# Patient Record
Sex: Female | Born: 1945 | Race: White | Hispanic: No | Marital: Single | Smoking: Current every day smoker
Health system: Southern US, Community
[De-identification: ages and names within clinical notes are randomized; demographics above are authoritative.]

## PROBLEM LIST (undated history)

## (undated) ENCOUNTER — Emergency Department: Discharge status: Absent without leave | Payer: Managed Care, Other (non HMO)

## (undated) DIAGNOSIS — R51 Headache: Secondary | ICD-10-CM

## (undated) DIAGNOSIS — M810 Age-related osteoporosis without current pathological fracture: Secondary | ICD-10-CM

## (undated) DIAGNOSIS — E785 Hyperlipidemia, unspecified: Secondary | ICD-10-CM

## (undated) DIAGNOSIS — R519 Headache, unspecified: Secondary | ICD-10-CM

## (undated) DIAGNOSIS — F209 Schizophrenia, unspecified: Secondary | ICD-10-CM

## (undated) DIAGNOSIS — F1910 Other psychoactive substance abuse, uncomplicated: Secondary | ICD-10-CM

## (undated) DIAGNOSIS — H409 Unspecified glaucoma: Secondary | ICD-10-CM

## (undated) DIAGNOSIS — M1990 Unspecified osteoarthritis, unspecified site: Secondary | ICD-10-CM

## (undated) DIAGNOSIS — F329 Major depressive disorder, single episode, unspecified: Secondary | ICD-10-CM

## (undated) DIAGNOSIS — F419 Anxiety disorder, unspecified: Secondary | ICD-10-CM

## (undated) DIAGNOSIS — G709 Myoneural disorder, unspecified: Secondary | ICD-10-CM

## (undated) DIAGNOSIS — E119 Type 2 diabetes mellitus without complications: Secondary | ICD-10-CM

## (undated) DIAGNOSIS — F32A Depression, unspecified: Secondary | ICD-10-CM

## (undated) DIAGNOSIS — M869 Osteomyelitis, unspecified: Secondary | ICD-10-CM

## (undated) DIAGNOSIS — J449 Chronic obstructive pulmonary disease, unspecified: Secondary | ICD-10-CM

## (undated) DIAGNOSIS — I10 Essential (primary) hypertension: Secondary | ICD-10-CM

## (undated) DIAGNOSIS — H269 Unspecified cataract: Secondary | ICD-10-CM

## (undated) HISTORY — DX: Type 2 diabetes mellitus without complications: E11.9

## (undated) HISTORY — DX: Unspecified osteoarthritis, unspecified site: M19.90

## (undated) HISTORY — DX: Anxiety disorder, unspecified: F41.9

## (undated) HISTORY — DX: Unspecified glaucoma: H40.9

## (undated) HISTORY — PX: VASCULAR SURGERY: SHX849

## (undated) HISTORY — DX: Depression, unspecified: F32.A

## (undated) HISTORY — DX: Hyperlipidemia, unspecified: E78.5

## (undated) HISTORY — DX: Age-related osteoporosis without current pathological fracture: M81.0

## (undated) HISTORY — DX: Other psychoactive substance abuse, uncomplicated: F19.10

## (undated) HISTORY — PX: KNEE SURGERY: SHX244

## (undated) HISTORY — DX: Unspecified cataract: H26.9

## (undated) HISTORY — DX: Essential (primary) hypertension: I10

## (undated) HISTORY — DX: Major depressive disorder, single episode, unspecified: F32.9

## (undated) HISTORY — DX: Myoneural disorder, unspecified: G70.9

## (undated) HISTORY — PX: MULTIPLE TOOTH EXTRACTIONS: SHX2053

## (undated) HISTORY — PX: EYE SURGERY: SHX253

---

## 1999-08-04 ENCOUNTER — Inpatient Hospital Stay (HOSPITAL_COMMUNITY): Admission: EM | Admit: 1999-08-04 | Discharge: 1999-08-05 | Payer: Self-pay | Admitting: Emergency Medicine

## 1999-08-05 ENCOUNTER — Encounter: Payer: Self-pay | Admitting: *Deleted

## 1999-08-12 ENCOUNTER — Encounter (HOSPITAL_COMMUNITY): Admission: RE | Admit: 1999-08-12 | Discharge: 1999-11-10 | Payer: Self-pay | Admitting: *Deleted

## 2006-04-18 ENCOUNTER — Emergency Department (HOSPITAL_COMMUNITY): Admission: EM | Admit: 2006-04-18 | Discharge: 2006-04-18 | Payer: Self-pay | Admitting: Family Medicine

## 2006-06-13 ENCOUNTER — Emergency Department (HOSPITAL_COMMUNITY): Admission: EM | Admit: 2006-06-13 | Discharge: 2006-06-13 | Payer: Self-pay | Admitting: Emergency Medicine

## 2006-12-06 ENCOUNTER — Emergency Department (HOSPITAL_COMMUNITY): Admission: EM | Admit: 2006-12-06 | Discharge: 2006-12-06 | Payer: Self-pay | Admitting: Emergency Medicine

## 2007-01-20 ENCOUNTER — Ambulatory Visit: Payer: Self-pay | Admitting: Endocrinology

## 2007-01-20 DIAGNOSIS — R51 Headache: Secondary | ICD-10-CM | POA: Insufficient documentation

## 2007-01-20 DIAGNOSIS — E1165 Type 2 diabetes mellitus with hyperglycemia: Secondary | ICD-10-CM

## 2007-01-20 DIAGNOSIS — R519 Headache, unspecified: Secondary | ICD-10-CM | POA: Insufficient documentation

## 2007-01-20 DIAGNOSIS — E785 Hyperlipidemia, unspecified: Secondary | ICD-10-CM

## 2007-02-23 ENCOUNTER — Ambulatory Visit: Payer: Self-pay | Admitting: Internal Medicine

## 2007-02-23 DIAGNOSIS — I10 Essential (primary) hypertension: Secondary | ICD-10-CM

## 2007-02-23 DIAGNOSIS — F17200 Nicotine dependence, unspecified, uncomplicated: Secondary | ICD-10-CM

## 2007-02-23 DIAGNOSIS — F259 Schizoaffective disorder, unspecified: Secondary | ICD-10-CM | POA: Insufficient documentation

## 2007-03-10 ENCOUNTER — Encounter: Payer: Self-pay | Admitting: Internal Medicine

## 2007-03-16 ENCOUNTER — Ambulatory Visit: Payer: Self-pay | Admitting: Internal Medicine

## 2007-03-16 LAB — CONVERTED CEMR LAB
ALT: 29 units/L (ref 0–35)
AST: 27 units/L (ref 0–37)
Albumin: 4.1 g/dL (ref 3.5–5.2)
Alkaline Phosphatase: 63 units/L (ref 39–117)
Basophils Absolute: 0 10*3/uL (ref 0.0–0.1)
Calcium: 9.7 mg/dL (ref 8.4–10.5)
Chloride: 94 meq/L — ABNORMAL LOW (ref 96–112)
Eosinophils Absolute: 0.2 10*3/uL (ref 0.0–0.6)
GFR calc non Af Amer: 91 mL/min
HDL: 37.2 mg/dL — ABNORMAL LOW (ref >39.0)
Hgb A1c MFr Bld: 10.7 % — ABNORMAL HIGH (ref 4.6–6.0)
MCHC: 32.3 g/dL (ref 30.0–36.0)
MCV: 88.7 fL (ref 78.0–100.0)
Platelets: 235 10*3/uL (ref 150–400)
RBC: 4.61 M/uL (ref 3.87–5.11)
TSH: 1.55 microintl units/mL (ref 0.35–5.50)
Total CHOL/HDL Ratio: 8
Triglycerides: 192 mg/dL — ABNORMAL HIGH (ref 0–149)
WBC: 5.9 10*3/uL (ref 4.5–10.5)

## 2007-03-21 ENCOUNTER — Encounter: Payer: Self-pay | Admitting: Internal Medicine

## 2007-04-06 ENCOUNTER — Ambulatory Visit: Payer: Self-pay | Admitting: Internal Medicine

## 2007-05-01 ENCOUNTER — Ambulatory Visit: Payer: Self-pay | Admitting: Endocrinology

## 2007-05-11 ENCOUNTER — Ambulatory Visit: Payer: Self-pay | Admitting: Endocrinology

## 2007-05-11 ENCOUNTER — Ambulatory Visit: Payer: Self-pay | Admitting: Internal Medicine

## 2007-05-11 LAB — CONVERTED CEMR LAB
AST: 25 units/L (ref 0–37)
HDL: 40.8 mg/dL (ref >39.0)
Triglycerides: 146 mg/dL (ref 0–149)

## 2007-05-18 ENCOUNTER — Ambulatory Visit: Payer: Self-pay | Admitting: Internal Medicine

## 2007-05-23 ENCOUNTER — Encounter: Payer: Self-pay | Admitting: Internal Medicine

## 2007-07-10 ENCOUNTER — Ambulatory Visit: Payer: Self-pay | Admitting: Internal Medicine

## 2007-07-10 DIAGNOSIS — R350 Frequency of micturition: Secondary | ICD-10-CM

## 2007-07-11 ENCOUNTER — Encounter: Payer: Self-pay | Admitting: Internal Medicine

## 2007-07-12 LAB — CONVERTED CEMR LAB
ALT: 25 units/L (ref 0–35)
AST: 26 units/L (ref 0–37)
Albumin: 4 g/dL (ref 3.5–5.2)
BUN: 13 mg/dL (ref 6–23)
Bilirubin Urine: NEGATIVE
CO2: 28 meq/L (ref 19–32)
Chloride: 100 meq/L (ref 96–112)
Cholesterol: 198 mg/dL (ref 0–200)
Creatinine, Ser: 0.7 mg/dL (ref 0.4–1.2)
GFR calc non Af Amer: 90 mL/min
Glucose, Bld: 273 mg/dL — ABNORMAL HIGH (ref 70–99)
Hgb A1c MFr Bld: 9.9 % — ABNORMAL HIGH (ref 4.6–6.0)
Leukocytes, UA: NEGATIVE
Total CHOL/HDL Ratio: 5.6
Total Protein: 7.1 g/dL (ref 6.0–8.3)
Triglycerides: 236 mg/dL (ref 0–149)
VLDL: 47 mg/dL — ABNORMAL HIGH (ref 0–40)
pH: 5 (ref 5.0–8.0)

## 2007-07-21 ENCOUNTER — Telehealth (INDEPENDENT_AMBULATORY_CARE_PROVIDER_SITE_OTHER): Payer: Self-pay | Admitting: *Deleted

## 2007-07-26 ENCOUNTER — Ambulatory Visit: Payer: Self-pay | Admitting: Internal Medicine

## 2007-12-25 ENCOUNTER — Telehealth: Payer: Self-pay | Admitting: Internal Medicine

## 2008-03-08 ENCOUNTER — Telehealth: Payer: Self-pay | Admitting: Internal Medicine

## 2008-09-17 ENCOUNTER — Emergency Department (HOSPITAL_COMMUNITY): Admission: EM | Admit: 2008-09-17 | Discharge: 2008-09-17 | Payer: Self-pay | Admitting: Family Medicine

## 2009-08-26 ENCOUNTER — Emergency Department (HOSPITAL_COMMUNITY): Admission: EM | Admit: 2009-08-26 | Discharge: 2009-08-26 | Payer: Self-pay | Admitting: Emergency Medicine

## 2009-09-03 ENCOUNTER — Encounter: Admission: RE | Admit: 2009-09-03 | Discharge: 2009-09-03 | Payer: Self-pay | Admitting: Otolaryngology

## 2009-09-08 ENCOUNTER — Ambulatory Visit (HOSPITAL_BASED_OUTPATIENT_CLINIC_OR_DEPARTMENT_OTHER): Admission: RE | Admit: 2009-09-08 | Discharge: 2009-09-08 | Payer: Self-pay | Admitting: Otolaryngology

## 2009-09-11 ENCOUNTER — Encounter: Admission: RE | Admit: 2009-09-11 | Discharge: 2009-10-17 | Payer: Self-pay | Admitting: Family Medicine

## 2009-10-06 ENCOUNTER — Emergency Department (HOSPITAL_COMMUNITY): Admission: EM | Admit: 2009-10-06 | Discharge: 2009-10-06 | Payer: Self-pay | Admitting: Emergency Medicine

## 2009-11-04 ENCOUNTER — Encounter
Admission: RE | Admit: 2009-11-04 | Discharge: 2009-11-20 | Payer: Self-pay | Source: Home / Self Care | Admitting: Family Medicine

## 2010-02-22 LAB — CONVERTED CEMR LAB: Pap Smear: NORMAL

## 2010-04-09 LAB — COMPREHENSIVE METABOLIC PANEL
ALT: 21 U/L (ref 0–35)
AST: 22 U/L (ref 0–37)
Albumin: 4.3 g/dL (ref 3.5–5.2)
CO2: 28 mEq/L (ref 19–32)
Calcium: 9.6 mg/dL (ref 8.4–10.5)
Chloride: 101 mEq/L (ref 96–112)
GFR calc Af Amer: >60 mL/min (ref >60)
GFR calc non Af Amer: >60 mL/min (ref >60)
Sodium: 137 mEq/L (ref 135–145)
Total Bilirubin: 0.6 mg/dL (ref 0.3–1.2)

## 2010-04-09 LAB — URINALYSIS, ROUTINE W REFLEX MICROSCOPIC
Bilirubin Urine: NEGATIVE
Ketones, ur: NEGATIVE mg/dL
Protein, ur: NEGATIVE mg/dL
Urobilinogen, UA: 0.2 mg/dL (ref 0.0–1.0)

## 2010-04-09 LAB — DIFFERENTIAL
Eosinophils Absolute: 0.2 10*3/uL (ref 0.0–0.7)
Eosinophils Relative: 2 % (ref 0–5)
Lymphocytes Relative: 37 % (ref 12–46)
Lymphs Abs: 3.6 10*3/uL (ref 0.7–4.0)
Monocytes Absolute: 0.7 10*3/uL (ref 0.1–1.0)

## 2010-04-09 LAB — URINE MICROSCOPIC-ADD ON

## 2010-04-09 LAB — LIPASE, BLOOD: Lipase: 34 U/L (ref 11–59)

## 2010-04-09 LAB — CBC
Hemoglobin: 14 g/dL (ref 12.0–15.0)
Platelets: 241 10*3/uL (ref 150–400)
RBC: 4.49 MIL/uL (ref 3.87–5.11)

## 2010-04-09 LAB — GLUCOSE, CAPILLARY: Glucose-Capillary: 231 mg/dL — ABNORMAL HIGH (ref 70–99)

## 2010-04-10 LAB — BASIC METABOLIC PANEL
BUN: 14 mg/dL (ref 6–23)
Chloride: 97 mEq/L (ref 96–112)
GFR calc non Af Amer: >60 mL/min (ref >60)
Potassium: 5.2 mEq/L — ABNORMAL HIGH (ref 3.5–5.1)
Sodium: 133 mEq/L — ABNORMAL LOW (ref 135–145)

## 2010-04-10 LAB — GLUCOSE, CAPILLARY
Glucose-Capillary: 308 mg/dL — ABNORMAL HIGH (ref 70–99)
Glucose-Capillary: 342 mg/dL — ABNORMAL HIGH (ref 70–99)

## 2010-06-02 ENCOUNTER — Ambulatory Visit
Admission: RE | Admit: 2010-06-02 | Discharge: 2010-06-02 | Disposition: A | Discharge status: Home / Self Care | Payer: BC Managed Care – PPO | Source: Ambulatory Visit | Attending: Family Medicine | Admitting: Family Medicine

## 2010-06-02 ENCOUNTER — Other Ambulatory Visit: Payer: Self-pay | Admitting: Family Medicine

## 2010-06-02 DIAGNOSIS — R0989 Other specified symptoms and signs involving the circulatory and respiratory systems: Secondary | ICD-10-CM

## 2010-06-09 ENCOUNTER — Other Ambulatory Visit: Payer: Self-pay | Admitting: Family Medicine

## 2010-06-09 DIAGNOSIS — Z1231 Encounter for screening mammogram for malignant neoplasm of breast: Secondary | ICD-10-CM

## 2010-06-12 NOTE — Procedures (Signed)
[facility]  Patient:    [name], [name]                     MRN: [id number] Proc. Date: 08/05/99 Adm. Date:  [id number] Attending:  Sharyn Dross                           Procedure Report  PREOPERATIVE DIAGNOSIS: 1. Blood in stools. 2. Family history of colon polyps.  POSTOPERATIVE DIAGNOSIS:  Normal colonoscopic examination to the cecum.  PROCEDURE PERFORMED:  Colonoscopy.  MEDICATIONS USED:  Demerol 100 mg IV, Versed 10 mg IV over a 15-minute period of time.  INSTRUMENT USED:  Olympus video pancolonoscope.  ENDOSCOPIST:  Dortha Kern, M.D.  INFORMED CONSENT:  The patient was advised of the procedure, the indications and the risks involved.  The patient has agreed to have the procedure performed.  PREOPERATIVE PREPARATION:  The patient was brought to the endoscopy unit where an IV for IV sedating medication was started.  A monitor was placed on the patient to monitor the patients vital signs and oxygen saturation.  Nasal oxygen at two liters a minute was used and after adequate sedation was performed, the procedure was begun.  BOWEL PREP:  The patient was given GoLYTELY and Reglan as a bowel prep at this time.  The patient tolerated the prep well without any complications.  The quality of the prep was fair to good.  DESCRIPTION OF PROCEDURE:  The instrument was advanced with the patient lying in the left lateral position to approximately 95 cm in the proximal colon to the cecum.  This was confirmed by palpation, transillumination as well as visualization of what appeared to be the ileocecal valve.  There appeared to be no gross abnormalities such as masses, polyps or stricture lesions appreciated.  The vascular pattern appeared to be well within normal limits throughout the entire colon.  The mucosal pattern showed evidence of diverticulosis that was present at this time but otherwise no other abnormalities noted.  There was no evidence  of any residual heme that was appreciated at this time.  The diverticula appeared to be small to some moderate-sized diverticula at this time.  No evidence of any diverticulitis was appreciated at this time.  There was no increased tortuosity of the colon that was appreciated at this time.  The instrument was able to traverse all the way to the cecum with minimal discomforts that was noted to the patient.  The instrument was subsequently removed from the rectum without any evidence of internal and external hemorrhoids noted.  The patient tolerating the procedure well.  TREATMENT: 1. Conservative management at this time. 2. Will prepare the patient for discharge in the a.m. if relatively stable. DD:  08/05/99 TD:  08/05/99 Job: [id number]

## 2010-06-12 NOTE — H&P (Signed)
[facility]  Patient:    [name], [name]                     MRN: [id number] Adm. Date:  [id number] Attending:  Devoria Albe                         History and Physical  SUBJECTIVE:  This pleasant, 65 year old, white female was admitted into the hospital because of orthostasis, dizziness, and tarry stools over the last several days.  The patient has recently traveled down to the [place] area from [place], [place].  She has been on Celebrex and Ultram for joint pains and past knee surgery.  She has been relatively stable but recently has been experiencing left upper quadrant pains and discomforts.  She has also had increased anorexia with nauseousness and some emesis of material, usually bilious in color, that was present.  Over the past few days the patient has noticed increased darkening of her stools, becoming more tarry in character at this time.  Today (on the day before admission) the patient was noticing increased symptoms of dizziness associated with with orthostatic changes.  She subsequently was brought into the emergency room whereupon being evaluated by the EDP present, showed evidence of moderate to marked orthostasis and extreme paleness with symptoms consistent possibly with an upper GI bleed.  I was contacted to come in to evaluate the patient at this time.  The patient denies any previous history of peptic ulcer disease and admits to being on the Celebrex and Ultram for the last several years for which she has taken for her joints discomfort.  The patient has been followed by her primary physician (Dr. Remer Macho).  She presently admits to being on a series of medications for problems such as hypertension, as well as on medicines like Paxil and amitriptyline at this time.  The patient presently is not on any aspirin or aspirin-like therapy that is presently known.  PAST MEDICAL HISTORY:  The patient does have a history of  hypertension for which she takes Tenormin medication on a daily basis.  She is presently also on an antibiotic (similar to Ceftin) for a sinus or upper infection.  OBJECTIVE:  GENERAL:  She is a pleasant female lying in the supine position at this time. She is communicative, alert, and oriented.  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  VITAL SIGNS:  Her vital signs appear to be stable with a blood pressure initially of 122/71 lying, decreased to 113/65 sitting.  The standing blood pressure was unable to be taken because it dropped.  Her pulse rate was 120 lying, increasing to 127 in a sitting position.  Her respiratory rate was 20. She was afebrile.  HEENT:  Anicteric.  Funduscopic examination was benign.  NECK:  Supple.  LUNGS:  Clear to auscultation and percussion.  HEART:  Tachycardic, without heaves, thrills, murmurs, or gallops.  ABDOMEN:  Soft.  Positive tenderness to palpation in the left upper quadrant region without rebound or referred tenderness.  There was no asymmetry of the abdominal region present at this time.  No palpable spleen or hepatosplenomegaly noted.  RECTAL:  The RE was not performed at this time (previously done by the EDP, with heme positive stools noted).  EXTREMITIES:  No cyanosis, clubbing, or edema.  NEUROLOGIC:  Not tested.  LABORATORY DATA:  Initial hemoglobin 12.6, hematocrit 38.  Normal MCV at this time.  Polys 83%, lymphs 12%, monos 3%.  Her BMET showed a serum sodium of 134, potassium 4.9, chloride 102, CO2 21.9, glucose 137, BUN 58, creatinine 1, calcium 10.1, total protein 7.8, albumin 4.1, total bilirubin 0.5, alkaline phosphatase 108, AST 17, ALT 15, amylase 43, lipase 17.  GROSS IMPRESSION:  Gastrointestinal bleeding, moderate.  Most likely consistent with an upper gastrointestinal bleed.  The differential diagnosis to consider is peptic ulcer disease, gastritis, or nonsteroidal anti-inflammatory drug  gastropathy versus other causes.  PRESENT RECOMMENDATIONS:  The patient will be admitted into the hospital today for emergency endoscopy to be performed, and depending upon these results will determine the course of therapy. DD:  08/04/99 TD:  08/04/99 Job: [id number]

## 2010-06-12 NOTE — Discharge Summary (Signed)
[facility]  Patient:    [name], [name]                     MRN: [id number] Adm. Date:  [id number] Disc. Date: [id number] Attending:  Sharyn Dross                           Discharge Summary  ADMITTING DIAGNOSIS:  Gastrointestinal bleeding, etiology unknown.  DISCHARGE DIAGNOSIS:  Diverticulosis, presently no bleeding noted.  CONDITION ON DISCHARGE:  Stable and improved.  DISCHARGE MEDICATIONS:  None.  To resume previous home medications without change.  COMPLICATIONS:  None.  PROCEDURES: 1. July 10 - Esophagogastroduodenoscopy showing mild gastritis.  No active bleeding    noted. 2. August 05, 1999 - Colonoscopy showing diverticulosis without blood present.  HOSPITAL COURSE:  The patient was admitted into the hospital because of increased abdominal pain and discomfort.  After getting extremely dizzy, she presented to the emergency room, where she was orthostatic in character.  Because of the documented melanotic stools that were present, the patient was brought in at that time.  The patient was admitted into the hospital and brought to endoscopy on the first day.  Endoscopy showed evidence of mild gastritis changes, but no evidence of any bleeding noted.  There was bilious material that was present, and the small intestinal region appeared to be within normal limits.  IVs were started at this time and the patient was typed and screened for blood.  However, although her hemoglobin had dropped from 11.8 to approximately 9.2, the patient was not orthostatic and remained relatively stable.  The patient was cleaned and prepped for colonoscopic examination and, on July 11, the patient underwent colonoscopic examination.  the colon showed evidence of diverticulosis throughout the colon area, but no evidence of any bleeding that as present at this time.  The patient had an excellent bowel prep which evacuated ny residual bleeding that was  noted.  The patients condition is relatively stable at this time and, although there is  the family history of colon polyps, the patient was relieved that she did not have any process of polyps, cancers or lesions that were present.  The patient will e discharged to home at this time to resume her previous medications which she is  taking.  She also to know that her hemoglobin is slightly low at approximately , but it identified not require a transfusion at this time.  She was advised to contact her primary physician when she gets back to her location and to have her blood count checked to see if transfusion may be necessary for her at that time.  I discussed with the patient diverticulosis and the complications of diverticular disease from that standpoint.  We have also discussed the treatment plan, the recurrence of possible bleeding which could occur as well as the indications for surgery if it does occur.  The patient also was noted to have a family history f colon polyps (a brother), and was advised that the rest of the family members (brothers and sisters) be evaluated for possible colon polyps or neoplastic processes that were present.  Although they fully understand this at this time nd are in agreement to have this done, they will comply by contacting their primary physicians for this process.  FINAL DIAGNOSIS:  As above.  CLINICAL CONDITION:  Stable and improved. DD:  08/05/99 TD:  08/06/99 Job: [id number]

## 2010-06-12 NOTE — Procedures (Signed)
[facility]  Patient:    [name], [name]                     MRN: [id number] Proc. Date: 08/04/99 Adm. Date:  [id number] Attending:  Sharyn Dross                           Procedure Report  POSTOPERATIVE DIAGNOSIS:  Rule out blood in stools.  POSTOPERATIVE DIAGNOSIS:  Mild antral gastritis, otherwise normal endoscopic examination.  No bleeding identified.  PROCEDURE:  Esophagogastroduodenoscopy.  MEDICATIONS:  Demerol 50 mg IV, Versed 6.5 mg IV over 10-15 minutes.  INSTRUMENT:  Olympus video panendoscope.  ENDOSCOPIST:  Sharyn Dross., M.D.  INFORMED CONSENT:  The patient was advised of the procedure, indications, risks involved and patient agreed to have procedure performed.  PREPROCEDURE PREPARATION:  Patient was brought to the endoscopy unit with an IV and sedation medication was used.  Monitor was placed on the patient to monitor the patients vital signs as well as oxygen saturation. Nasal oxygen at 2 liters per minute was used and after adequate sedation, the procedure was begun.  DESCRIPTION OF PROCEDURE:  The patient was placed in the left lateral decubitus position.  Instrument was advanced using direct technique without difficulty.  The oropharynx, epiglottis, vocal cords and piriform sinuses appeared to be grossly within normal  limits.  The esophagus was normal without any evidence of acute inflammation, ulcerations, hiatal hernia or varices appreciated.  The gastric areas showed a normal mucous lake without any evidence of any bleeding noted at this time. The was bilious and salivary material present in the gastric region at this time.  The mucosa showed some minimal inflammatory changes in the antral region at this time, especially around the prepyloric region, but no evidence of any ulcerations or visible bleeding vessels noted.  The pylorus was normal with good peristaltic activity.  UPon advancing into the pyloric canal,  the duodenal bulb and second portion appeared to be within normal limits. The instrument could not be advanced further because of increased retching by the patient at that time.  The instrument was gradually retracted and on retroflexion in the cardia, no evidence of hiatal hernia present or any visible bleeding spots noted at this time.  The Z line appeared to be at approximately 39 cm in the distal esophagus.  as the instrument was retracted back further, there was a small reddish area but no bleeding appreciated at this time.  The instrument was subsequently withdrawn without difficulty.  The patient tolerated the procedure well.  TREATMENT: 1. Would continue with present protocol with IV Pepcid at this time. 2. Blood pool study today. 3. In discussion with the family, there is a strong history of first    generation relatives having polypoid lesions appreciated; thus, will    proceed with colonoscopic examination in the a.m. DD:  08/04/99 TD:  08/04/99 Job: 350 [id number]

## 2010-06-24 ENCOUNTER — Ambulatory Visit
Admission: RE | Admit: 2010-06-24 | Discharge: 2010-06-24 | Disposition: A | Discharge status: Home / Self Care | Payer: BC Managed Care – PPO | Source: Ambulatory Visit | Attending: Family Medicine | Admitting: Family Medicine

## 2010-06-24 DIAGNOSIS — Z1231 Encounter for screening mammogram for malignant neoplasm of breast: Secondary | ICD-10-CM

## 2014-04-06 ENCOUNTER — Ambulatory Visit (INDEPENDENT_AMBULATORY_CARE_PROVIDER_SITE_OTHER): Payer: Medicare Other

## 2014-04-06 ENCOUNTER — Ambulatory Visit (INDEPENDENT_AMBULATORY_CARE_PROVIDER_SITE_OTHER): Payer: Medicare Other | Admitting: Emergency Medicine

## 2014-04-06 VITALS — BP 106/70 | HR 89 | Temp 98.2°F | Ht 60.0 in | Wt 216.5 lb

## 2014-04-06 DIAGNOSIS — M545 Low back pain: Secondary | ICD-10-CM

## 2014-04-06 DIAGNOSIS — F17200 Nicotine dependence, unspecified, uncomplicated: Secondary | ICD-10-CM

## 2014-04-06 DIAGNOSIS — Z720 Tobacco use: Secondary | ICD-10-CM | POA: Diagnosis not present

## 2014-04-06 MED ORDER — ACETAMINOPHEN-CODEINE 300-30 MG PO TABS: 1.0000 | ORAL_TABLET | ORAL | Status: DC | PRN

## 2014-04-06 NOTE — Progress Notes (Signed)
Routine labs were done Synthroid and Flonase were refilled

## 2014-04-06 NOTE — Progress Notes (Addendum)
Subjective:  This chart was scribed for Nena Jordan, MD by Mercy Moore, Medial Scribe. This patient was seen in room 13 and the patient's care was started at 3:48 PM.    Patient ID: [name], female    DOB: 10/17/1945, 69 y.o.   MRN: [id number] Chief Complaint  Patient presents with  . Back Pain    C/o lower back pain since 2-3 weeks. Radius down leg at times & wakes her up from her sleep     HPI HPI Comments: [name] is a 69 y.o. female who presents to the [facility] complaining of lower back pain with radiation into left leg, ongoing for 2-3 weeks. Patient states that she feels that she's pulled a muscle in her lower back or leg. Patient reports two incidences of recent heavy lifting, but she is unsure what initiated her pain. Patient reports that her pain is so severe that it wakes up at night, and she has been unable to comfortably rest at night since onset of her pain. Patient reports that her pain restricts her from performing mundane house work.  Patient reports upcoming appointment with her PCP, in two days, but she states she couldn't wait that long.  Patient shares that she has taken Vicodin, but she doesn't like it because "it makes her feel like she will throw up." Patient reports success with Codeine.    Patient Active Problem List   Diagnosis Date Noted  . FREQUENCY, URINARY 07/10/2007  . 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 TOBACCO ABUSE 02/23/2007  . HYPERTENSION 02/23/2007  . DIAB W/O MENTION COMP TYPE II/UNS TYPE UNCNTRL 01/20/2007  . HYPERLIPIDEMIA 01/20/2007  . HEADACHE 01/20/2007   Past Medical History  Diagnosis Date  . Anxiety   . Arthritis   . Cataract   . Depression   . Diabetes mellitus without complication   . Glaucoma   . Hypertension   . Osteoporosis   . Substance abuse    Past Surgical History  Procedure Laterality Date  . Eye surgery    . Knee surgery     Allergies  Allergen Reactions  .  Sitagliptin Phosphate     REACTION: nausea   Prior to Admission medications   Medication Sig Start Date End Date Taking? Authorizing Provider  amLODipine (NORVASC) 10 MG tablet Take 10 mg by mouth daily.   Yes Historical Provider, MD  chlorproMAZINE (THORAZINE) 100 MG tablet Take 100 mg by mouth 3 (three) times daily.   Yes Historical Provider, MD  Insulin Aspart (NOVOLOG FLEXPEN [place]) Inject into the skin. Takes 7mg , 9mg , 9mg  daily   Yes Historical Provider, MD  insulin glargine (LANTUS) 100 UNIT/ML injection Inject into the skin at bedtime. Takes 17mg  at bedtime   Yes Historical Provider, MD  sertraline (ZOLOFT) 100 MG tablet Take 100 mg by mouth daily.   Yes Historical Provider, MD  valsartan (DIOVAN) 160 MG tablet Take 160 mg by mouth daily.   Yes Historical Provider, MD   History   Social History  . Marital Status: Single    Spouse Name: N/A  . Number of Children: N/A  . Years of Education: N/A   Occupational History  . Not on file.   Social History Main Topics  . Smoking status: Current Every Day Smoker  . Smokeless tobacco: Not on file  . Alcohol Use: No  . Drug Use: No  . Sexual Activity: Not on file   Other Topics Concern  . Not on  file   Social History Narrative  . No narrative on file      Review of Systems  Constitutional: Negative for fever and chills.  Genitourinary: Negative for hematuria.  Musculoskeletal: Positive for back pain.  Skin: Negative for color change and rash.  Neurological: Negative for weakness and numbness.       Objective:   Physical Exam  CONSTITUTIONAL: Well developed/well nourished HEAD: Normocephalic/atraumatic EYES: EOMI/PERRL ENMT: Mucous membranes moist NECK: supple no meningeal signs SPINE/BACK: mild tenderness to lower lumbar spine on left, straight leg raise negative CV: S1/S2 noted, no murmurs/rubs/gallops noted LUNGS: Lungs are clear to auscultation bilaterally, no apparent distress ABDOMEN: soft, nontender, no  rebound or guarding, bowel sounds noted throughout abdomen GU: no cva tenderness NEURO: Pt is awake/alert/appropriate, moves all extremitiesx4.  No facial droop. Motor strength 5/5   EXTREMITIES: pulses normal/equal, full ROM  SKIN: warm, color normal PSYCH: no abnormalities of mood noted, alert and oriented to situation  Filed Vitals:   04/06/14 1520  BP: 106/70  Pulse: 89  Temp: 98.2 F (36.8 C)  TempSrc: Oral  Height: 5' (1.524 m)  Weight: 216 lb 8 oz (98.204 kg)  SpO2: 91%   [facility] reading (PRIMARY) by  Dr. Everlene Farrier there is significant facet arthritis there is significant vascular calcification along the obliques there are 4 buttons seen.      Assessment & Plan:  3 his Tylenol 3 for pain because she can take this medication. We'll schedule an ultrasound the abdomen because of her significant vascular calcification.I personally performed the services described in this documentation, which was scribed in my presence. The recorded information has been reviewed and is accurate.

## 2014-04-15 ENCOUNTER — Other Ambulatory Visit: Payer: Self-pay

## 2014-04-24 ENCOUNTER — Ambulatory Visit
Admission: RE | Admit: 2014-04-24 | Discharge: 2014-04-24 | Disposition: A | Discharge status: Home / Self Care | Payer: Medicare Other | Source: Ambulatory Visit | Attending: Emergency Medicine | Admitting: Emergency Medicine

## 2014-04-24 ENCOUNTER — Other Ambulatory Visit: Payer: Self-pay | Admitting: Emergency Medicine

## 2014-04-24 DIAGNOSIS — F17200 Nicotine dependence, unspecified, uncomplicated: Secondary | ICD-10-CM

## 2014-04-24 DIAGNOSIS — M545 Low back pain: Secondary | ICD-10-CM

## 2014-06-06 ENCOUNTER — Encounter: Payer: Self-pay | Admitting: Internal Medicine

## 2014-06-06 ENCOUNTER — Telehealth: Payer: Self-pay

## 2014-06-06 NOTE — Telephone Encounter (Signed)
Called and spoke with patient to find out if she has had a recent mammogram. Patient declines mammogram at this time.

## 2014-07-09 ENCOUNTER — Telehealth: Payer: Self-pay

## 2014-07-09 NOTE — Telephone Encounter (Signed)
Contacted pt and she will make an appt

## 2014-09-23 ENCOUNTER — Telehealth: Payer: Self-pay

## 2014-09-23 DIAGNOSIS — I8390 Asymptomatic varicose veins of unspecified lower extremity: Secondary | ICD-10-CM

## 2014-09-23 DIAGNOSIS — I83892 Varicose veins of left lower extremities with other complications: Secondary | ICD-10-CM

## 2014-09-23 NOTE — Telephone Encounter (Signed)
Phone call received from pt.  Reported she had an episode of sudden onset of bleeding from vein in left lower leg @ ankle.  Reported "there was blood everywhere, but my son was able to get it to stop bleeding."  Reported the bleeding occurred on Friday, 8/26.  Stated she cleaned the site with an antiseptic cleanser, and wrapped the leg, and rested through the weekend.  Reported no further bleeding since episode on Friday.  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   Denied any swelling.  Denied any open sores on lower leg.  Stated she has some redness of the left lower leg that comes and goes.  Advised will have a scheduler call back with appt.  Pt. requested to see Dr. Oneida Alar, but stated she'll see whomever has an opening 1st.  Advised if bleeding of vein reoccurs, to hold direct pressure over the site, x 5 minutes, with 1-2 fingers, and elevate to stop the bleeding.  Verb. Understanding.

## 2014-10-25 ENCOUNTER — Encounter: Payer: Medicare Other | Admitting: Vascular Surgery

## 2014-10-25 ENCOUNTER — Encounter (HOSPITAL_COMMUNITY): Payer: Medicare Other

## 2015-06-05 ENCOUNTER — Ambulatory Visit (INDEPENDENT_AMBULATORY_CARE_PROVIDER_SITE_OTHER): Payer: Medicare Other | Admitting: Family Medicine

## 2015-06-05 VITALS — BP 120/62 | HR 74 | Temp 97.9°F | Resp 16 | Ht 61.0 in | Wt 208.4 lb

## 2015-06-05 DIAGNOSIS — S239XXA Sprain of unspecified parts of thorax, initial encounter: Secondary | ICD-10-CM | POA: Diagnosis not present

## 2015-06-05 DIAGNOSIS — S43402A Unspecified sprain of left shoulder joint, initial encounter: Secondary | ICD-10-CM | POA: Diagnosis not present

## 2015-06-05 MED ORDER — MELOXICAM 7.5 MG PO TABS: 7.5000 mg | ORAL_TABLET | Freq: Every day | ORAL | Status: DC | PRN

## 2015-06-05 NOTE — Patient Instructions (Addendum)
Please gently stretch your back several times a day Use heat as needed Come back to see [name] if you are not better in a couple of days or if you get worse  Thoracic Strain A thoracic strain, which is sometimes called a mid-back strain, is an injury to the muscles or tendons that attach to the upper part of your back behind your chest. This type of injury occurs when a muscle is overstretched or overloaded.  Thoracic strains can range from mild to severe. Mild strains may involve stretching a muscle or tendon without tearing it. These injuries may heal in 1-2 weeks. More severe strains involve tearing of muscle fibers or tendons. These will cause more pain and may take 6-8 weeks to heal. CAUSES This condition may be caused by:  An injury in which a sudden force is placed on the muscle.  Exercising without properly warming up.  Overuse of the muscle.  Improper form during certain movements.  Other injuries that surround or cause stress on the mid-back, causing a strain on the muscles. In some cases, the cause may not be known. RISK FACTORS This injury is more common in:  Athletes.  People with obesity. SYMPTOMS The main symptom of this condition is pain, especially with movement. Other symptoms include:  Bruising.  Swelling.  Spasm. DIAGNOSIS This condition may be diagnosed with a physical exam. X-rays may be taken to check for a fracture. TREATMENT This condition may be treated with:  Resting and icing the injured area.  Physical therapy. This will involve doing stretching and strengthening exercises.  Medicines for pain and inflammation. HOME CARE INSTRUCTIONS  Rest as needed. Follow instructions from your health care provider about any restrictions on activity.  If directed, apply ice to the injured area:  Put ice in a plastic bag.  Place a towel between your skin and the bag.  Leave the ice on for 20 minutes, 2-3 times per day.  Take over-the-counter and  prescription medicines only as told by your health care provider.  Begin doing exercises as told by your health care provider or physical therapist.  Always warm up properly before physical activity or sports.  Bend your knees before you lift heavy objects.  Keep all follow-up visits as told by your health care provider. This is important. SEEK MEDICAL CARE IF:  Your pain is not helped by medicine.  Your pain, bruising, or swelling is getting worse.  You have a fever. SEEK IMMEDIATE MEDICAL CARE IF:  You have shortness of breath.  You have chest pain.  You develop numbness or weakness in your legs.  You have involuntary loss of urine (urinary incontinence).   This information is not intended to replace advice given to you by your health care provider. Make sure you discuss any questions you have with your health care provider.   Document Released: 04/03/2003 Document Revised: 10/02/2014 Document Reviewed: 03/07/2014 Elsevier Interactive Patient Education 2016 [employer].    IF you received an x-ray today, you will receive an invoice from [facility]. Please contact [facility] at [phone] with questions or concerns regarding your invoice.   IF you received labwork today, you will receive an invoice from [employer]. Please contact [facility] at [phone] with questions or concerns regarding your invoice.   Our billing staff will not be able to assist you with questions regarding bills from these companies.  You will be contacted with the lab results as soon as they are available. The fastest  way to get your results is to activate your My Chart account. Instructions are located on the last page of this paperwork. If you have not heard from [name] regarding the results in 2 weeks, please contact this office.

## 2015-06-05 NOTE — Progress Notes (Signed)
   Subjective:    Patient ID: [name], female    DOB: [birth year], 70 y.o.   MRN: [id number]  HPI This is a pleasant 70 yo female who presents today with 2 days of left shoulder and back pain. She was trying to get a bra on that she had sewn the front closed. She had pain last night and today. She got some relief with tylenol x 1 dose, hot shower and topical spray application. She states ibuprofen doesn't help her. She requests pain medication.    Past Medical History  Diagnosis Date  . Anxiety   . Arthritis   . Cataract   . Depression   . Diabetes mellitus without complication ([place])   . Glaucoma   . Hypertension   . Osteoporosis   . Substance abuse    Past Surgical History  Procedure Laterality Date  . Eye surgery    . Knee surgery     Family History  Problem Relation Age of Onset  . Heart disease Father    Social History  Substance Use Topics  . Smoking status: Current Every Day Smoker  . Smokeless tobacco: None  . Alcohol Use: No      Review of Systems + left shoulder pain, + back pain, no numbness/tingling/weakness/falls. No chest pain, no SOB    Objective:   Physical Exam Physical Exam  Constitutional: Oriented to person, place, and time. She appears well-developed and well-nourished. Obese.  HENT:  Head: Normocephalic and atraumatic.  Eyes: Conjunctivae are normal.  Neck: Normal range of motion. Neck supple.  Cardiovascular: Normal rate, regular rhythm and normal heart sounds.   Pulmonary/Chest: Effort normal and breath sounds normal.  Musculoskeletal: Normal range of motion.  Full ROM neck, back, left shoulder, left thoracic paraspinal muscles tender and pain with left shoulder extension.  Neurological: Alert and oriented to person, place, and time.  Skin: Skin is warm and dry.  Psychiatric: Normal mood and affect. Behavior is normal. Judgment and thought content normal.  Vitals reviewed.     BP 120/62 mmHg  Pulse 74  Temp(Src) 97.9 F  (36.6 C) (Oral)  Resp 16  Ht 5\' 1"  (1.549 m)  Wt 208 lb 6.4 oz (94.53 kg)  BMI 39.40 kg/m2  SpO2 92%     Assessment & Plan:  1. Thoracic back sprain, initial encounter - discussed medication and potential side effects, encouraged her to take medication for shortest duration to treat symptoms.  - meloxicam (MOBIC) 7.5 MG tablet; Take 1 tablet (7.5 mg total) by mouth daily as needed for pain.  Dispense: 15 tablet; Refill: 0  2. Shoulder sprain, left, initial encounter - encouraged ROM several times a day, heat for comfort - meloxicam (MOBIC) 7.5 MG tablet; Take 1 tablet (7.5 mg total) by mouth daily as needed for pain.  Dispense: 15 tablet; Refill: 0 - RTC if no improvement in 5-7 days, sooner if any worsening symptoms.   Clarene Reamer, FNP-BC  [facility]  06/08/2015 9:12 PM

## 2015-09-08 ENCOUNTER — Ambulatory Visit (INDEPENDENT_AMBULATORY_CARE_PROVIDER_SITE_OTHER): Payer: Medicare Other | Admitting: Physician Assistant

## 2015-09-08 VITALS — BP 120/68 | HR 88 | Temp 98.0°F | Resp 18 | Ht 61.0 in | Wt 210.4 lb

## 2015-09-08 DIAGNOSIS — R0602 Shortness of breath: Secondary | ICD-10-CM

## 2015-09-08 DIAGNOSIS — R0902 Hypoxemia: Secondary | ICD-10-CM | POA: Diagnosis not present

## 2015-09-08 NOTE — Patient Instructions (Signed)
     IF you received an x-ray today, you will receive an invoice from [facility]. Please contact [facility] at [phone] with questions or concerns regarding your invoice.   IF you received labwork today, you will receive an invoice from [facility]. Please contact [facility] at [phone] with questions or concerns regarding your invoice.   Our billing staff will not be able to assist you with questions regarding bills from these companies.  You will be contacted with the lab results as soon as they are available. The fastest way to get your results is to activate your My Chart account. Instructions are located on the last page of this paperwork. If you have not heard from us regarding the results in 2 weeks, please contact this office.      

## 2015-09-08 NOTE — Progress Notes (Signed)
[facility] ISSA  MRN: [id number] DOB: [birth year]  PCP: Cathlean Cower, MD  Subjective:  Pt is a pleasant 70 year old female with a history of schizoaffective disorder, diabetes and hypertension, presenting to clinic for evaluation of shortness of breath.  She is trying to get assistance from [facility], but needs a medical diagnosis.  She is here today to prove she gets short of breath with walking.   Her former PCP retired and she is in the process of scheduling her initial appointment with a new PCP.  Her daughter-in-law is with her today, she says she and her husband transport Ms. Depass, but are moving later this week, leaving Ms. Pallett to care for herself alone. They need a diagnosis for SCAT ASAP.      Review of Systems  Constitutional: Negative for chills, diaphoresis, fatigue and fever.  Respiratory: Positive for shortness of breath and wheezing (when she is short of breath). Negative for apnea, choking and chest tightness.   Cardiovascular: Negative for chest pain, palpitations and leg swelling.  Musculoskeletal: Negative for back pain and gait problem.  Skin: Negative for color change and pallor.  Neurological: Negative for dizziness, syncope, weakness, light-headedness, numbness and headaches.  Psychiatric/Behavioral: Positive for agitation and confusion. The patient is nervous/anxious.     Patient Active Problem List   Diagnosis Date Noted  . FREQUENCY, URINARY 07/10/2007  . 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 TOBACCO ABUSE 02/23/2007  . HYPERTENSION 02/23/2007  . DIAB W/O MENTION COMP TYPE II/UNS TYPE UNCNTRL 01/20/2007  . HYPERLIPIDEMIA 01/20/2007  . HEADACHE 01/20/2007    Current Outpatient Prescriptions on File Prior to Visit  Medication Sig Dispense Refill  . amLODipine (NORVASC) 10 MG tablet Take 10 mg by mouth daily.    . chlorproMAZINE (THORAZINE) 100 MG tablet Take 100 mg by mouth 3 (three) times daily.    . Insulin Aspart (NOVOLOG FLEXPEN [place]) Inject into the  skin. Takes 7mg , 9mg , 9mg  daily    . insulin glargine (LANTUS) 100 UNIT/ML injection Inject into the skin at bedtime. Takes 17mg  at bedtime    . meloxicam (MOBIC) 7.5 MG tablet Take 1 tablet (7.5 mg total) by mouth daily as needed for pain. 15 tablet 0  . pregabalin (LYRICA) 100 MG capsule Take 100 mg by mouth 3 (three) times daily.    . sertraline (ZOLOFT) 100 MG tablet Take 100 mg by mouth daily.    . valsartan (DIOVAN) 160 MG tablet Take 160 mg by mouth daily.     No current facility-administered medications on file prior to visit.     Allergies  Allergen Reactions  . Sitagliptin Phosphate     REACTION: nausea    Objective:  BP 120/68   Pulse 88   Temp 98 F (36.7 C) (Oral)   Resp 18   Ht 5\' 1"  (1.549 m)   Wt 210 lb 6.4 oz (95.4 kg)   SpO2 94%   BMI 39.75 kg/m   Physical Exam  Constitutional: She is well-developed, well-nourished, and in no distress. No distress.  Cardiovascular: Normal rate, regular rhythm and normal heart sounds.   Pulmonary/Chest: Effort normal and breath sounds normal. No respiratory distress. She has no wheezes. She has no rales.  Skin: Skin is warm and dry. She is not diaphoretic.  Psychiatric: Mood and memory normal. She expresses impulsivity (Patient gives an inappropriate amount of detail and history with history taking. ).  Vitals reviewed.   Procedure: Patient ambulated with pulse oximetry finger evaluation around the clinic with assistance  by a CMA. The goal was to circumnavigate four times around the clinic center, however she was only able to do three laps before she was too fatigued to continue. Her pulse ox ranged from 89-84 during the exam.    Assessment and Plan :  1. Shortness of breath - Pulse oximetry (single) with ambulation.   2. Hypoxia - Patient's pulse oximetry dropped to 84% during ambulatory testing.  - Ambulatory referral to Pulmonology.  - For home use only DME 4 wheeled rolling walker with seat [id number]   Mercer Pod, PA-C  [facility] 09/08/2015 6:01 PM

## 2015-09-18 ENCOUNTER — Encounter: Payer: Self-pay | Admitting: Internal Medicine

## 2015-09-18 ENCOUNTER — Ambulatory Visit (INDEPENDENT_AMBULATORY_CARE_PROVIDER_SITE_OTHER)
Admission: RE | Admit: 2015-09-18 | Discharge: 2015-09-18 | Disposition: A | Discharge status: Home / Self Care | Payer: Medicare Other | Source: Ambulatory Visit | Attending: Internal Medicine | Admitting: Internal Medicine

## 2015-09-18 ENCOUNTER — Ambulatory Visit (INDEPENDENT_AMBULATORY_CARE_PROVIDER_SITE_OTHER): Payer: Medicare Other | Admitting: Internal Medicine

## 2015-09-18 VITALS — BP 106/68 | HR 70 | Ht 62.5 in | Wt 211.6 lb

## 2015-09-18 DIAGNOSIS — F17210 Nicotine dependence, cigarettes, uncomplicated: Secondary | ICD-10-CM

## 2015-09-18 DIAGNOSIS — J449 Chronic obstructive pulmonary disease, unspecified: Secondary | ICD-10-CM | POA: Diagnosis not present

## 2015-09-18 DIAGNOSIS — J8410 Pulmonary fibrosis, unspecified: Secondary | ICD-10-CM

## 2015-09-18 NOTE — Patient Instructions (Signed)
The key is to stop smoking completely before smoking completely stops you!   Please remember to go to the x-ray department downstairs for your tests - we will call you with the results when they are available.     Please schedule a follow up office visit in 6 weeks, call sooner if needed with pfts, ok to push back for same day visit

## 2015-09-18 NOTE — Progress Notes (Signed)
Subjective:    Patient ID: [name], female    DOB: 1945/03/08, 70 y.o.   MRN: [id number]  HPI  26 yowf from [facility] active smoker with MO/ djd with  Back pain referred to pulmonary clinic 09/18/2015 by Benjamine Mola McVey/Dr Carlota Raspberry with progressive doe x 2007.   09/18/2015 1st [facility] office visit/ Kolbee Stallman   Chief Complaint  Patient presents with  . Pulmonary Consult    Referred by Dr. Juanda Crumble. Pt c/o SOB for the past 5 yrs. She states that she is here today to get assitance from [facility]. She states that she gets SOB walking short distances.   indolent onset gradually worse doe x 2007  to now baseline MMRC3 = can't walk 100 yards even at a slow pace at a flat grade s stopping due to sob  / "inhalers don't work" but not sure which ones she's tried  No obvious other patterns in day to day or daytime variabilty or assoc chronic cough or cp or chest tightness, subjective wheeze overt sinus or hb symptoms. No unusual exp hx or h/o childhood pna/ asthma or knowledge of premature birth.  Sleeping ok without nocturnal  or early am exacerbation  of respiratory  c/o's or need for noct saba. Also denies any obvious fluctuation of symptoms with weather or environmental changes or other aggravating or alleviating factors except as outlined above   Current Medications, Allergies, Complete Past Medical History, Past Surgical History, Family History, and Social History were reviewed in [employer] record.           Review of Systems  Constitutional: Negative for chills, fever and unexpected weight change.  HENT: Negative for congestion, dental problem, ear pain, nosebleeds, postnasal drip, rhinorrhea, sinus pressure, sneezing, sore throat, trouble swallowing and voice change.   Eyes: Negative for visual disturbance.  Respiratory: Positive for shortness of breath. Negative for cough and choking.   Cardiovascular: Negative for chest pain and leg swelling.    Gastrointestinal: Negative for abdominal pain, diarrhea and vomiting.  Genitourinary: Negative for difficulty urinating.  Musculoskeletal: Negative for arthralgias.  Skin: Negative for rash.  Neurological: Negative for tremors, syncope and headaches.  Hematological: Does not bruise/bleed easily.       Objective:   Physical Exam  amb obese wf nad  Wt Readings from Last 3 Encounters:  09/18/15 211 lb 9.6 oz (96 kg)  09/08/15 210 lb 6.4 oz (95.4 kg)  06/05/15 208 lb 6.4 oz (94.5 kg)    Vital signs reviewed   HEENT: nl dentition, turbinates, and oropharynx. Nl external ear canals without cough reflex   NECK :  without JVD/Nodes/TM/ nl carotid upstrokes bilaterally   LUNGS: no acc muscle use,  slt barrel/ kyphotic chest wall with distant bs bilaterally s wheeze  CV:  RRR  no s3 or murmur or increase in P2, no edema   ABD:  soft and nontender with nl inspiratory excursion in the supine position. No bruits or organomegaly, bowel sounds nl  MS:  Nl gait/ ext warm without deformities, calf tenderness, cyanosis or clubbing No obvious joint restrictions   SKIN: warm and dry without lesions    NEURO:  alert, approp, nl sensorium with  no motor deficits     CXR PA and Lateral:   09/18/2015 :    I personally reviewed images and agree with radiology impression as follows:    Slight increase in pulmonary interstitial prominence noted on today's exam. Although a component of this may  be related to chronic interstitial changes an active process such as pneumonitis cannot be excluded. No focal alveolar infiltrates. Heart size normal.      Assessment & Plan:

## 2015-09-19 NOTE — Progress Notes (Signed)
LMTCB

## 2015-09-21 DIAGNOSIS — J8410 Pulmonary fibrosis, unspecified: Secondary | ICD-10-CM | POA: Insufficient documentation

## 2015-09-21 NOTE — Assessment & Plan Note (Signed)
09/18/2015   Walked RA  2 laps @ 185 ft each stopped due to  desat to 87% fast pace  DDx for pulmonary fibrosis  includes idiopathic pulmonary fibrosis, pulmonary fibrosis associated with rheumatologic diseases (which have a relatively benign course in most cases) , adverse effect from  drugs such as chemotherapy or amiodarone exposure, nonspecific interstitial pneumonia which is typically steroid responsive, and chronic hypersensitivity pneumonitis.   In active  smokers Langerhan's Cell  Histiocyctosis (eosinophilic granuomatosis),  DIP,  and Respiratory Bronchiolitis ILD also need to be considered, and RBILD would have to be at the top of her list given smoking hx.   Will need HRCT on return also to help sort out.

## 2015-09-21 NOTE — Assessment & Plan Note (Signed)

## 2015-09-21 NOTE — Assessment & Plan Note (Signed)
Spirometry 09/18/2015  FEV1 ? 1.44 but f/v loop uninterpretable  - 09/18/2015   Walked RA  2 laps @ 185 ft each stopped due to  Sob/ desat to 87% at fast pace   Not sure whether problem is really obst vs restrictive based on pfts but she is desaturating with exertion and will need a more thorough w/u but for now needs to focus on d/c cigs (see separate a/p)   Since has "tried inhalers" and symptoms are chronic will wait until we get a full set with before and after saba to sort out what she needs  Total time devoted to counseling  = 35/77m review case with pt/ discussion of options/alternatives/ personally creating written instructions  in presence of pt  then going over those specific  Instructions directly with the pt including how to use all of the meds but in particular covering each new medication in detail and the difference between the maintenance/automatic meds and the prns using an action plan format for the latter.

## 2015-09-22 ENCOUNTER — Telehealth: Payer: Self-pay

## 2015-09-22 NOTE — Telephone Encounter (Signed)
PT CALLED AGAIN. PLEASE SEE PREVIOUS MESSAGES. PT STATES THAT SHE WAS JUST DIAGNOSED WITH COPD AND WOULD LIKE THAT INCLUDED ON THE FORMS.

## 2015-09-22 NOTE — Telephone Encounter (Signed)
PATIENT STATES SHE WAS IN THE OFFICE ON AUG. [place] [facility]. SHE GAVE HER A FORM TO BE FILLED OUT FOR HER TO BE ABLE TO RIDE THE [facility] BUS. IT WAS SUPPOSE TO BE FAXED TO [facility] AT [facility]. SHE WOULD LIKE TO KNOW IF IT HAS EVER BEEN DONE BECAUSE SHE NEVER HEARD ANYTHING BACK FROM [place]. SHE SAID IT HAS BEEN 2 WEEKS AND THIS IS VERY IMPORTANT BECAUSE SHE HAS BEEN TURNED DOWN WITH [facility] BEFORE AND SHE DOES NOT WANT TO BE TURNED DOWN AGAIN. PLEASE CALL TO LET HER KNOW. BEST PHONE [phone] (CELL)  [place]

## 2015-09-22 NOTE — Telephone Encounter (Signed)
Pt called again. Please see previous form. She wants to emphasize the importance of these forms being filled out COMPLETELY.

## 2015-09-22 NOTE — Progress Notes (Signed)
lmtcb

## 2015-09-23 NOTE — Telephone Encounter (Signed)
Spoke with pt and she stated she will come in Friday 8/1 with the form

## 2015-09-23 NOTE — Telephone Encounter (Signed)
Left voicemail message with patient. I filled out her original [facility] form and gave it to her daughter-in-law who was with her at the appointment. Since that time, she was seen by pulm and diagnosed with COPD. If she would like to bring in another SC[facility] form, I will gladly fill that out for her. Thank you.

## 2015-12-06 ENCOUNTER — Ambulatory Visit: Payer: Medicare Other

## 2016-01-22 ENCOUNTER — Ambulatory Visit (INDEPENDENT_AMBULATORY_CARE_PROVIDER_SITE_OTHER): Payer: Medicare Other | Admitting: Physician Assistant

## 2016-01-22 ENCOUNTER — Emergency Department (HOSPITAL_COMMUNITY): Payer: Medicare Other

## 2016-01-22 ENCOUNTER — Emergency Department (HOSPITAL_COMMUNITY)
Admission: EM | Admit: 2016-01-22 | Discharge: 2016-01-22 | Disposition: A | Discharge status: Home / Self Care | Payer: Medicare Other | Attending: Emergency Medicine | Admitting: Emergency Medicine

## 2016-01-22 VITALS — BP 82/58 | HR 74 | Temp 97.4°F | Resp 18 | Ht 62.5 in | Wt 204.0 lb

## 2016-01-22 DIAGNOSIS — F17200 Nicotine dependence, unspecified, uncomplicated: Secondary | ICD-10-CM | POA: Diagnosis present

## 2016-01-22 DIAGNOSIS — Y999 Unspecified external cause status: Secondary | ICD-10-CM | POA: Diagnosis not present

## 2016-01-22 DIAGNOSIS — E118 Type 2 diabetes mellitus with unspecified complications: Secondary | ICD-10-CM

## 2016-01-22 DIAGNOSIS — Y939 Activity, unspecified: Secondary | ICD-10-CM | POA: Diagnosis not present

## 2016-01-22 DIAGNOSIS — X58XXXA Exposure to other specified factors, initial encounter: Secondary | ICD-10-CM | POA: Diagnosis not present

## 2016-01-22 DIAGNOSIS — Y929 Unspecified place or not applicable: Secondary | ICD-10-CM | POA: Diagnosis not present

## 2016-01-22 DIAGNOSIS — Z794 Long term (current) use of insulin: Secondary | ICD-10-CM | POA: Insufficient documentation

## 2016-01-22 DIAGNOSIS — J8410 Pulmonary fibrosis, unspecified: Secondary | ICD-10-CM | POA: Diagnosis present

## 2016-01-22 DIAGNOSIS — W19XXXA Unspecified fall, initial encounter: Secondary | ICD-10-CM

## 2016-01-22 DIAGNOSIS — F17210 Nicotine dependence, cigarettes, uncomplicated: Secondary | ICD-10-CM

## 2016-01-22 DIAGNOSIS — S2232XA Fracture of one rib, left side, initial encounter for closed fracture: Secondary | ICD-10-CM | POA: Diagnosis present

## 2016-01-22 DIAGNOSIS — Z7982 Long term (current) use of aspirin: Secondary | ICD-10-CM | POA: Diagnosis not present

## 2016-01-22 DIAGNOSIS — J449 Chronic obstructive pulmonary disease, unspecified: Secondary | ICD-10-CM | POA: Diagnosis not present

## 2016-01-22 DIAGNOSIS — R1084 Generalized abdominal pain: Secondary | ICD-10-CM | POA: Diagnosis not present

## 2016-01-22 DIAGNOSIS — S299XXA Unspecified injury of thorax, initial encounter: Secondary | ICD-10-CM | POA: Diagnosis present

## 2016-01-22 DIAGNOSIS — I10 Essential (primary) hypertension: Secondary | ICD-10-CM | POA: Diagnosis present

## 2016-01-22 DIAGNOSIS — R1032 Left lower quadrant pain: Secondary | ICD-10-CM | POA: Diagnosis not present

## 2016-01-22 DIAGNOSIS — E119 Type 2 diabetes mellitus without complications: Secondary | ICD-10-CM | POA: Diagnosis not present

## 2016-01-22 LAB — POC MICROSCOPIC URINALYSIS (UMFC): MUCUS RE: ABSENT

## 2016-01-22 LAB — COMPREHENSIVE METABOLIC PANEL
ALBUMIN: 4.1 g/dL (ref 3.5–5.0)
ALT: 21 U/L (ref 14–54)
ANION GAP: 5 (ref 5–15)
AST: 19 U/L (ref 15–41)
Alkaline Phosphatase: 80 U/L (ref 38–126)
BILIRUBIN TOTAL: 0.3 mg/dL (ref 0.3–1.2)
BUN: 18 mg/dL (ref 6–20)
CO2: 27 mmol/L (ref 22–32)
Calcium: 9.8 mg/dL (ref 8.9–10.3)
Chloride: 104 mmol/L (ref 101–111)
Creatinine, Ser: 0.82 mg/dL (ref 0.44–1.00)
GFR calc Af Amer: >60 mL/min (ref >60)
GLUCOSE: 236 mg/dL — AB (ref 65–99)
POTASSIUM: 4.3 mmol/L (ref 3.5–5.1)
Sodium: 136 mmol/L (ref 135–145)
TOTAL PROTEIN: 7.9 g/dL (ref 6.5–8.1)

## 2016-01-22 LAB — CBC WITH DIFFERENTIAL/PLATELET
BASOS ABS: 0 10*3/uL (ref 0.0–0.1)
BASOS PCT: 0 %
Eosinophils Absolute: 0.3 10*3/uL (ref 0.0–0.7)
Eosinophils Relative: 3 %
HEMATOCRIT: 45.7 % (ref 36.0–46.0)
HEMOGLOBIN: 15 g/dL (ref 12.0–15.0)
Lymphocytes Relative: 33 %
Lymphs Abs: 3.5 10*3/uL (ref 0.7–4.0)
MCH: 30.4 pg (ref 26.0–34.0)
MCHC: 32.8 g/dL (ref 30.0–36.0)
MCV: 92.5 fL (ref 78.0–100.0)
Monocytes Absolute: 1 10*3/uL (ref 0.1–1.0)
Monocytes Relative: 9 %
NEUTROS ABS: 5.9 10*3/uL (ref 1.7–7.7)
NEUTROS PCT: 55 %
Platelets: 223 10*3/uL (ref 150–400)
RBC: 4.94 MIL/uL (ref 3.87–5.11)
RDW: 15.7 % — ABNORMAL HIGH (ref 11.5–15.5)
WBC: 10.8 10*3/uL — AB (ref 4.0–10.5)

## 2016-01-22 LAB — POCT URINALYSIS DIP (MANUAL ENTRY)
BILIRUBIN UA: NEGATIVE
BILIRUBIN UA: NEGATIVE
Blood, UA: NEGATIVE
Glucose, UA: >=1000 — AB
Nitrite, UA: NEGATIVE
PROTEIN UA: NEGATIVE
Spec Grav, UA: 1.01
Urobilinogen, UA: 1
pH, UA: 5.5

## 2016-01-22 LAB — POCT CBC
GRANULOCYTE PERCENT: 60.6 % (ref 37–80)
HEMATOCRIT: 43.8 % (ref 37.7–47.9)
Hemoglobin: 15.1 g/dL (ref 12.2–16.2)
Lymph, poc: 3.4 (ref 0.6–3.4)
MCH, POC: 30.8 pg (ref 27–31.2)
MCHC: 34.4 g/dL (ref 31.8–35.4)
MCV: 89.5 fL (ref 80–97)
MID (CBC): 0.8 (ref 0–0.9)
MPV: 7 fL (ref 0–99.8)
POC GRANULOCYTE: 6.5 (ref 2–6.9)
POC LYMPH %: 31.6 % (ref 10–50)
POC MID %: 7.8 % (ref 0–12)
Platelet Count, POC: 229 10*3/uL (ref 142–424)
RBC: 4.89 M/uL (ref 4.04–5.48)
RDW, POC: 15.3 %
WBC: 10.7 10*3/uL — AB (ref 4.6–10.2)

## 2016-01-22 LAB — GLUCOSE, POCT (MANUAL RESULT ENTRY): POC Glucose: 305 mg/dl — AB (ref 70–99)

## 2016-01-22 LAB — LIPASE, BLOOD: LIPASE: 23 U/L (ref 11–51)

## 2016-01-22 MED ORDER — TOPIRAMATE 25 MG PO TABS: 50.0000 mg | ORAL_TABLET | Freq: Every day | ORAL | Status: DC

## 2016-01-22 MED ORDER — SERTRALINE HCL 50 MG PO TABS: 100.0000 mg | ORAL_TABLET | Freq: Every day | ORAL | Status: DC

## 2016-01-22 MED ORDER — IOPAMIDOL (ISOVUE-300) INJECTION 61%
100.0000 mL | Freq: Once | INTRAVENOUS | Status: AC | PRN
  Administered 2016-01-22: 100 mL via INTRAVENOUS

## 2016-01-22 MED ORDER — ACETAMINOPHEN 500 MG PO TABS: 500.0000 mg | ORAL_TABLET | Freq: Four times a day (QID) | ORAL | Status: DC | PRN

## 2016-01-22 MED ORDER — HYDROXYZINE HCL 10 MG PO TABS: 10.0000 mg | ORAL_TABLET | Freq: Two times a day (BID) | ORAL | Status: DC | PRN

## 2016-01-22 MED ORDER — PREGABALIN 50 MG PO CAPS: 100.0000 mg | ORAL_CAPSULE | Freq: Three times a day (TID) | ORAL | Status: DC

## 2016-01-22 MED ORDER — ONDANSETRON HCL 4 MG/2ML IJ SOLN
4.0000 mg | Freq: Once | INTRAMUSCULAR | Status: AC
  Administered 2016-01-22: 4 mg via INTRAVENOUS
  Filled 2016-01-22: qty 2

## 2016-01-22 MED ORDER — ASPIRIN EC 81 MG PO TBEC: 81.0000 mg | DELAYED_RELEASE_TABLET | Freq: Every day | ORAL | Status: DC

## 2016-01-22 MED ORDER — ATORVASTATIN CALCIUM 20 MG PO TABS: 20.0000 mg | ORAL_TABLET | Freq: Every day | ORAL | Status: DC

## 2016-01-22 MED ORDER — AZITHROMYCIN 250 MG PO TABS: 500.0000 mg | ORAL_TABLET | ORAL | Status: DC

## 2016-01-22 MED ORDER — IRBESARTAN 150 MG PO TABS: 150.0000 mg | ORAL_TABLET | Freq: Every day | ORAL | Status: DC

## 2016-01-22 MED ORDER — INSULIN ASPART 100 UNIT/ML FLEXPEN: 7.0000 [IU] | PEN_INJECTOR | Freq: Three times a day (TID) | SUBCUTANEOUS | Status: DC

## 2016-01-22 MED ORDER — CHLORPROMAZINE HCL 25 MG PO TABS: 100.0000 mg | ORAL_TABLET | Freq: Three times a day (TID) | ORAL | Status: DC

## 2016-01-22 MED ORDER — ACETAMINOPHEN-CODEINE #3 300-30 MG PO TABS: 1.0000 | ORAL_TABLET | Freq: Four times a day (QID) | ORAL | 0 refills | Status: DC | PRN

## 2016-01-22 MED ORDER — AMLODIPINE BESYLATE 5 MG PO TABS: 10.0000 mg | ORAL_TABLET | Freq: Every day | ORAL | Status: DC

## 2016-01-22 MED ORDER — ACETAMINOPHEN 500 MG PO TABS
500.0000 mg | ORAL_TABLET | Freq: Four times a day (QID) | ORAL | Status: DC | PRN
Start: 2016-01-22 — End: 2016-01-22

## 2016-01-22 MED ORDER — IBUPROFEN 200 MG PO TABS: 400.0000 mg | ORAL_TABLET | Freq: Four times a day (QID) | ORAL | Status: DC | PRN

## 2016-01-22 MED ORDER — FUROSEMIDE 40 MG PO TABS: 20.0000 mg | ORAL_TABLET | Freq: Every day | ORAL | Status: DC

## 2016-01-22 MED ORDER — LATANOPROST 0.005 % OP SOLN: 1.0000 [drp] | Freq: Every day | OPHTHALMIC | Status: DC

## 2016-01-22 MED ORDER — INSULIN GLARGINE 100 UNIT/ML ~~LOC~~ SOLN: 17.0000 [IU] | Freq: Every day | SUBCUTANEOUS | Status: DC

## 2016-01-22 MED ORDER — DEXTROSE 5 % IV SOLN: 1.0000 g | INTRAVENOUS | Status: DC

## 2016-01-22 MED ORDER — SODIUM CHLORIDE 0.9 % IV SOLN
INTRAVENOUS | Status: DC
  Administered 2016-01-22: 20:00:00 via INTRAVENOUS

## 2016-01-22 MED ORDER — FENTANYL CITRATE (PF) 100 MCG/2ML IJ SOLN
50.0000 ug | Freq: Once | INTRAMUSCULAR | Status: AC
  Administered 2016-01-22: 50 ug via INTRAVENOUS
  Filled 2016-01-22: qty 2

## 2016-01-22 MED ORDER — IOPAMIDOL (ISOVUE-300) INJECTION 61%
INTRAVENOUS | Status: AC
  Filled 2016-01-22: qty 100

## 2016-01-22 NOTE — Discharge Instructions (Signed)
Please be sure to follow up with your pulmonologist as soon as possible. Please take advantage of the provided smoking cessation resources.  Return here for concerning changes in your condition.

## 2016-01-22 NOTE — ED Provider Notes (Addendum)
[facility] DEPT Provider Note   CSN: [id number] Arrival date & time: 01/22/16  1712     History   Chief Complaint Chief Complaint  Patient presents with  . Abdominal Pain    HPI [name] is a 70 y.o. female.  HPI Patient presents with concern of left lower quadrant abdominal pain. Pain began yesterday, initially was subtle. Over the interval day the patient has had increasing pain, or consistent and severe in the left lower quadrant, and lower abdomen. There is associated nausea, generalized discomfort, but no vomiting, no diarrhea, no fever. No relief with OTC medication.  She has no history of abdominal surgery, states that she was well prior to this, and was grocery shopping when it began.   Past Medical History:  Diagnosis Date  . Anxiety   . Arthritis   . Cataract   . Depression   . Diabetes mellitus without complication ([place])   . Glaucoma   . Hyperlipidemia   . Hypertension   . Neuromuscular disorder ([place])   . Osteoporosis   . Substance abuse     Patient Active Problem List   Diagnosis Date Noted  . 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 COPD mixed type ([place]) 09/18/2015  . FREQUENCY, URINARY 07/10/2007  . 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 Cigarette smoker 02/23/2007  . HYPERTENSION 02/23/2007  . DIAB W/O MENTION COMP TYPE II/UNS TYPE UNCNTRL 01/20/2007  . HYPERLIPIDEMIA 01/20/2007  . HEADACHE 01/20/2007    Past Surgical History:  Procedure Laterality Date  . EYE SURGERY    . KNEE SURGERY      OB History    No data available       Home Medications    Prior to Admission medications   Medication Sig Start Date End Date Taking? Authorizing Provider  acetaminophen (TYLENOL) 500 MG tablet Take 500 mg by mouth every 6 (six) hours as needed.    Historical Provider, MD  amLODipine (NORVASC) 10 MG tablet Take 10 mg by mouth daily.    Historical Provider, MD  aspirin EC 81 MG tablet Take 81 mg by mouth daily.     Historical Provider, MD  atorvastatin (LIPITOR) 20 MG tablet Take 20 mg by mouth daily.    Historical Provider, MD  BD PEN NEEDLE NANO U/F 32G X 4 MM MISC  12/23/15   Historical Provider, MD  bimatoprost (LUMIGAN) 0.03 % ophthalmic solution Place 1 drop into both eyes at bedtime.    Historical Provider, MD  chlorproMAZINE (THORAZINE) 100 MG tablet Take 100 mg by mouth 3 (three) times daily.    Historical Provider, MD  furosemide (LASIX) 20 MG tablet Take 20 mg by mouth daily.    Historical Provider, MD  hydrOXYzine (ATARAX/VISTARIL) 10 MG tablet Take 10 mg by mouth 2 (two) times daily as needed.    Historical Provider, MD  insulin glargine (LANTUS) 100 UNIT/ML injection Inject into the skin at bedtime. Takes 17mg  at bedtime    Historical Provider, MD  NOVOLOG FLEXPEN 100 UNIT/ML FlexPen  11/15/15   Historical Provider, MD  pregabalin (LYRICA) 100 MG capsule Take 100 mg by mouth 3 (three) times daily.    Historical Provider, MD  sertraline (ZOLOFT) 100 MG tablet Take 100 mg by mouth daily.    Historical Provider, MD  topiramate (TOPAMAX) 50 MG tablet  01/03/16   Historical Provider, MD  valsartan (DIOVAN) 160 MG tablet Take 160 mg by mouth daily.    Historical Provider, MD  Vitamin D, Ergocalciferol, (DRISDOL) 50000  units CAPS capsule Take 50,000 Units by mouth every 30 (thirty) days.    Historical Provider, MD    Family History Family History  Problem Relation Age of Onset  . Heart disease Father     Social History Social History  Substance Use Topics  . Smoking status: Current Every Day Smoker    Packs/day: 2.00    Years: 53.00  . Smokeless tobacco: Never Used  . Alcohol use No     Allergies   Sitagliptin phosphate   Review of Systems Review of Systems  Constitutional:       Per HPI, otherwise negative  HENT:       Per HPI, otherwise negative  Respiratory:       Per HPI, otherwise negative  Cardiovascular:       Per HPI, otherwise negative  Gastrointestinal: Positive  for nausea. Negative for vomiting.  Endocrine:       Negative aside from HPI  Genitourinary:       Neg aside from HPI   Musculoskeletal:       Per HPI, otherwise negative  Skin: Negative.   Neurological: Negative for syncope.     Physical Exam Updated Vital Signs BP 131/68 (BP Location: Right Arm)   Pulse 67   Temp 98.1 F (36.7 C) (Oral)   Resp 16   SpO2 90%   Physical Exam  Constitutional: She is oriented to person, place, and time. She appears well-developed and well-nourished. No distress.  HENT:  Head: Normocephalic and atraumatic.  Eyes: Conjunctivae and EOM are normal.  Cardiovascular: Normal rate and regular rhythm.   Pulmonary/Chest: Effort normal and breath sounds normal. No stridor. No respiratory distress.  Abdominal: She exhibits no distension. There is tenderness. There is guarding.  Substantial guarding, tenderness to palpation about the left lateral abdomen, left lower quadrant.  Musculoskeletal: She exhibits no edema.  Neurological: She is alert and oriented to person, place, and time. No cranial nerve deficit.  Skin: Skin is warm and dry.  Psychiatric:  Anxious, noted history of schizoaffective disorder, but the patient tells a reliable story over the past 2 days, answers questions appropriately.  Nursing note and vitals reviewed.    ED Treatments / Results  Labs (all labs ordered are listed, but only abnormal results are displayed) Labs Reviewed  COMPREHENSIVE METABOLIC PANEL - Abnormal; Notable for the following:       Result Value   Glucose, Bld 236 (*)    All other components within normal limits  CBC WITH DIFFERENTIAL/PLATELET - Abnormal; Notable for the following:    WBC 10.8 (*)    RDW 15.7 (*)    All other components within normal limits  CULTURE, EXPECTORATED SPUTUM-ASSESSMENT  GRAM STAIN  LIPASE, BLOOD  STREP PNEUMONIAE URINARY ANTIGEN   Radiology Ct Abdomen Pelvis W Contrast  Result Date: 01/22/2016 CLINICAL DATA:  70 year old  female with left lower quadrant abdominal pain. EXAM: CT ABDOMEN AND PELVIS WITH CONTRAST TECHNIQUE: Multidetector CT imaging of the abdomen and pelvis was performed using the standard protocol following bolus administration of intravenous contrast. CONTRAST:  124mL ISOVUE-300 IOPAMIDOL (ISOVUE-300) INJECTION 61% COMPARISON:  04/24/2014 abdominal aortic sonogram. FINDINGS: Lower chest: Subpleural peripheral left lower lobe 5 mm solid pulmonary nodule (series 6/image 7). Separate solid 5 mm left lower lobe pulmonary nodule (series 6/ image 11). Patchy ground-glass opacities and minimal patchy consolidation in the right middle lobe and anterior right lower lobe. Hepatobiliary: Normal liver size. Diffuse hepatic steatosis. No liver mass. Normal gallbladder with no  radiopaque cholelithiasis. No biliary ductal dilatation. Pancreas: Normal, with no mass or duct dilation. Spleen: Normal size. No mass. Adrenals/Urinary Tract: Normal adrenals. No hydronephrosis. Mild-to-moderate scarring in the upper and lower poles of the right kidney. Simple left renal cysts measuring up to 6.2 cm in the lateral lower left kidney. No right renal lesions . Normal bladder. Stomach/Bowel: Grossly normal stomach. Normal caliber small bowel with no small bowel wall thickening. Normal appendix. Mild sigmoid diverticulosis, with no large bowel wall thickening or pericolonic fat stranding. Vascular/Lymphatic: Atherosclerotic nonaneurysmal abdominal aorta. Patent portal, splenic, hepatic and renal veins. No pathologically enlarged lymph nodes in the abdomen or pelvis. Reproductive: Mildly enlarged anteverted uterus with mild contour irregularity possibly due to septate uterus versus small uterine fibroids. No adnexal masses . Other: No pneumoperitoneum, ascites or focal fluid collection. Musculoskeletal: No aggressive appearing focal osseous lesions. Acute minimally displaced left posterior ninth rib fracture. No additional acute fracture. Mild  deformity of the superior T11 vertebral body, unchanged since 04/06/2014 lumbar spine radiographs. Mild-to-moderate thoracolumbar spondylosis. IMPRESSION: 1. Acute posterior left ninth rib fracture, minimally displaced. 2. Patchy consolidation and ground-glass opacity in the right middle lobe and anterior right lower lobe, probably infectious or inflammatory. Recommend attention on follow-up chest CT in 3 months. 3. Two solid 5 mm left lower lobe pulmonary nodules. No follow-up needed if patient is low-risk (and has no known or suspected primary neoplasm). Non-contrast chest CT can be considered in 12 months if patient is high-risk. This recommendation follows the consensus statement: Guidelines for Management of Incidental Pulmonary Nodules Detected on CT Images: From the Fleischner Society 2017; Radiology 2017; 284:228-243. 4. Additional findings include aortic atherosclerosis, right renal scarring, mild sigmoid diverticulosis, and diffuse hepatic steatosis. Electronically Signed   By: Ilona Sorrel M.D.   On: 01/22/2016 19:53    Procedures Procedures (including critical care time)  Medications Ordered in ED Medications  0.9 %  sodium chloride infusion ( Intravenous New Bag/Given 01/22/16 1939)  iopamidol (ISOVUE-300) 61 % injection (not administered)  cefTRIAXone (ROCEPHIN) 1 g in dextrose 5 % 50 mL IVPB (not administered)  azithromycin (ZITHROMAX) tablet 500 mg (not administered)  fentaNYL (SUBLIMAZE) injection 50 mcg (50 mcg Intravenous Given 01/22/16 1905)  ondansetron (ZOFRAN) injection 4 mg (4 mg Intravenous Given 01/22/16 1905)  iopamidol (ISOVUE-300) 61 % injection 100 mL (100 mLs Intravenous Contrast Given 01/22/16 1924)     Initial Impression / Assessment and Plan / ED Course  I have reviewed the triage vital signs and the nursing notes.  Pertinent labs & imaging results that were available during my care of the patient were reviewed by me and considered in my medical decision making  (see chart for details).  Clinical Course     On repeat exam the patient is in similar condition. She continues to complain of pain in the left chest wall. Notably, the patient hypoxic, room air saturation 87%, improves with nasal cannula  We discussed all findings including concern for pneumonia, as well as left rib fracture.  10:08 PM The patient's son is now here. Again we discussed all findings, importance of following up with pulmonology, using incentive spirometer, trying to stop smoking and return precautions. Patient has been evaluated by hospice team as a consulting service as well.  Final Clinical Impressions(s) / ED Diagnoses  Elderly female with schizoaffective disorder presents with left rib pain, is found to be hypoxic, and with radiographic findings concerning for both pneumonia and rib fracture. Patient is hypoxic on room air, though  this improved with nasal cannula, Though the though the patient has some concerning CT findings, patient does not clinically of pneumonia, and has baseline hypoxia, with known pulmonary fibrosis, and ongoing pulmonology evaluation. With no evidence for decompensation, tolerance of symptoms related, patient discharged in stable condition.    [name], MD 01/22/16 [id number]    [name], MD 01/22/16 2209

## 2016-01-22 NOTE — Progress Notes (Signed)
[name]  MRN: [id number] DOB: 1945-09-27  Subjective:  [name] is a 70 y.o. female seen in office today for a chief complaint of pulled muscle in stomach. She was at the supermarket yesterday and lifted her foldable shopping cart(<5lbs)  a few times and noticed the pain after she left the supermarket. She describes the pain as coming in waves (every few minutes) and notes it is extremely painful. Denies nausea, vomiting, and diarrhea.  She has tried aleve with no relief. Her last bowel movement was yesterday and was normal consistency prior to going to the supermarket. She has not passed any gas today. She has not eaten any food today, she has only drank fluids. She has only taken her blood pressure medication today as she was in too much pain to take her other medications.   Has no PMH of diverticulitis or kidney stones. No ambdominal surgical history. She is not up to date on her colonoscopy.   Review of Systems  Constitutional: Negative for chills, diaphoresis and fever.  Gastrointestinal: Negative for abdominal distention, anal bleeding and blood in stool.  Genitourinary: Negative for difficulty urinating, dysuria, flank pain, hematuria and urgency.  Musculoskeletal: Negative for back pain.    Patient Active Problem List   Diagnosis Date Noted  . 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 COPD mixed type ([place]) 09/18/2015  . FREQUENCY, URINARY 07/10/2007  . 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 Cigarette smoker 02/23/2007  . HYPERTENSION 02/23/2007  . DIAB W/O MENTION COMP TYPE II/UNS TYPE UNCNTRL 01/20/2007  . HYPERLIPIDEMIA 01/20/2007  . HEADACHE 01/20/2007    Current Outpatient Prescriptions on File Prior to Visit  Medication Sig Dispense Refill  . acetaminophen (TYLENOL) 500 MG tablet Take 500 mg by mouth every 6 (six) hours as needed.    Marland Kitchen amLODipine (NORVASC) 10 MG tablet Take 10 mg by mouth daily.    Marland Kitchen aspirin EC 81 MG tablet Take 81 mg by  mouth daily.    Marland Kitchen atorvastatin (LIPITOR) 20 MG tablet Take 20 mg by mouth daily.    . bimatoprost (LUMIGAN) 0.03 % ophthalmic solution Place 1 drop into both eyes at bedtime.    . chlorproMAZINE (THORAZINE) 100 MG tablet Take 100 mg by mouth 3 (three) times daily.    . furosemide (LASIX) 20 MG tablet Take 20 mg by mouth daily.    . hydrOXYzine (ATARAX/VISTARIL) 10 MG tablet Take 10 mg by mouth 2 (two) times daily as needed.    . insulin glargine (LANTUS) 100 UNIT/ML injection Inject into the skin at bedtime. Takes 17mg  at bedtime    . pregabalin (LYRICA) 100 MG capsule Take 100 mg by mouth 3 (three) times daily.    . sertraline (ZOLOFT) 100 MG tablet Take 100 mg by mouth daily.    . valsartan (DIOVAN) 160 MG tablet Take 160 mg by mouth daily.    . Vitamin D, Ergocalciferol, (DRISDOL) 50000 units CAPS capsule Take 50,000 Units by mouth every 30 (thirty) days.     No current facility-administered medications on file prior to visit.     Allergies  Allergen Reactions  . Sitagliptin Phosphate     REACTION: nausea       Social History   Social History  . Marital status: Single    Spouse name: N/A  . Number of children: N/A  . Years of education: N/A   Occupational History  . Not on file.   Social History Main Topics  . Smoking status: Current  Every Day Smoker    Packs/day: 2.00    Years: 53.00  . Smokeless tobacco: Never Used  . Alcohol use No  . Drug use: No  . Sexual activity: Not on file   Other Topics Concern  . Not on file   Social History Narrative  . No narrative on file    Objective:  BP (!) 82/58   Pulse 74   Temp 97.4 F (36.3 C) (Oral)   Resp 18   Ht 5' 2.5" (1.588 m)   Wt 204 lb (92.5 kg)   SpO2 91%   BMI 36.72 kg/m   Physical Exam  Constitutional: She is oriented to person, place, and time.  Pt is yelling from pain any time she changes position.   HENT:  Head: Normocephalic and atraumatic.  Eyes: Conjunctivae are normal.  Cardiovascular:  Normal rate, regular rhythm, normal heart sounds and normal pulses.   Pulmonary/Chest: Effort normal. She has wheezes (intermittent on posterior lung field).  Abdominal: Soft. Normal appearance and bowel sounds are normal. There is generalized tenderness. There is no CVA tenderness.  Musculoskeletal:       Right lower leg: She exhibits no swelling.       Left lower leg: She exhibits no swelling.  Neurological: She is alert and oriented to person, place, and time. Gait normal.  Skin: Skin is warm and dry.  Psychiatric: Her affect is inappropriate. She is agitated.     BP Readings from Last 3 Encounters:  01/22/16 (!) 82/58  09/18/15 106/68  09/08/15 120/68    Results for orders placed or performed in visit on 01/22/16 (from the past 24 hour(s))  POCT urinalysis dipstick     Status: Abnormal   Collection Time: 01/22/16  4:09 PM  Result Value Ref Range   Color, UA yellow yellow   Clarity, UA clear clear   Glucose, UA >=1,000 (A) negative   Bilirubin, UA negative negative   Ketones, POC UA negative negative   Spec Grav, UA 1.010    Blood, UA negative negative   pH, UA 5.5    Protein Ur, POC negative negative   Urobilinogen, UA 1.0    Nitrite, UA Negative Negative   Leukocytes, UA Trace (A) Negative  POCT Microscopic Urinalysis ([facility])     Status: Abnormal   Collection Time: 01/22/16  4:09 PM  Result Value Ref Range   WBC,UR,HPF,POC Few (A) None WBC/hpf   RBC,UR,HPF,POC None None RBC/hpf   Bacteria None None, Too numerous to count   Mucus Absent Absent   Epithelial Cells, UR Per Microscopy Many (A) None, Too numerous to count cells/hpf  POCT glucose (manual entry)     Status: Abnormal   Collection Time: 01/22/16  4:19 PM  Result Value Ref Range   POC Glucose 305 (A) 70 - 99 mg/dl  POCT CBC     Status: Abnormal   Collection Time: 01/22/16  4:20 PM  Result Value Ref Range   WBC 10.7 (A) 4.6 - 10.2 K/uL   Lymph, poc 3.4 0.6 - 3.4   POC LYMPH PERCENT 31.6 10 - 50 %L   MID  (cbc) 0.8 0 - 0.9   POC MID % 7.8 0 - 12 %M   POC Granulocyte 6.5 2 - 6.9   Granulocyte percent 60.6 37 - 80 %G   RBC 4.89 4.04 - 5.48 M/uL   Hemoglobin 15.1 12.2 - 16.2 g/dL   HCT, POC 43.8 37.7 - 47.9 %   MCV 89.5 80 - 97  fL   MCH, POC 30.8 27 - 31.2 pg   MCHC 34.4 31.8 - 35.4 g/dL   RDW, POC 15.3 %   Platelet Count, POC 229 142 - 424 K/uL   MPV 7.0 0 - 99.8 fL     Assessment and Plan :  -This case was precepted with Dr. Nolon Rod.Pt is hypotensive and in exquisite generalized pain on abdominal exam. Labs show minimally elevated WBC at 10.7, glucose of 305, glucosuria, and leukocytes and WBCs in UA. Due to her unstable vital signs, physical exam findings,  and comorbidities, pt would benefit from further evaluation at the ED. EMS was contacted and pt was emergently transported to ED.   1. Generalized abdominal pain - POCT CBC - POCT urinalysis dipstick - POCT Microscopic Urinalysis ([facility])  2. Type 2 diabetes mellitus with complication, with long-term current use of insulin (HCC) - POCT glucose (manual entry)  Tenna Delaine PA-C  [facility] 01/22/2016 4:22 PM

## 2016-01-22 NOTE — Patient Instructions (Signed)
     IF you received an x-ray today, you will receive an invoice from [facility]. Please contact  Radiology at [phone] with questions or concerns regarding your invoice.   IF you received labwork today, you will receive an invoice from [facility]. Please contact [facility] at [phone] with questions or concerns regarding your invoice.   Our billing staff will not be able to assist you with questions regarding bills from these companies.  You will be contacted with the lab results as soon as they are available. The fastest way to get your results is to activate your My Chart account. Instructions are located on the last page of this paperwork. If you have not heard from us regarding the results in 2 weeks, please contact this office.     

## 2016-01-22 NOTE — ED Notes (Signed)
Pt refused her night meds. Pt no longer being admitted. Pt stated she would like to have her medications at home. Pt stable and ambulatory at time of dc

## 2016-01-22 NOTE — ED Triage Notes (Signed)
Per EMS, pt is coming from [facility] with complaints of hypotension. EMS states that after several attempts at peripheral iv, pt BP became normal. BP was initially 84/60. Pt reports bending over yesterday and then left flank pain began. Pt has a hx of schizophrenia.

## 2016-01-22 NOTE — Consult Note (Addendum)
Medical Consultation   [name]  [id number]  DOB: 12-19-1945  DOA: 01/22/2016  PCP: Javier Docker, MD  Outpatient Specialists: Dr. Melvyn Novas (pulm)   Requesting physician: Dr. Vanita Panda  Reason for consultation: ? PNA, ? Hypoxia   History of Present Illness: [name] is an 70 y.o. female with h/o COPD, post inflammatory pulmonary fibrosis, smoking, schizoaffective disorder.  Patient presents to the ED with c/o L sided abdominal and flank pain.  Pain is sharp.  Pain onset on Wed after an injury involving a shopping cart.  Pain is severe, symptoms are persistent.  Symptoms worse with movement or taking a deep breath.  She denies: any increased SOB, increased cough, fever, chills.  Work up in the ED includes a CT abd/pelvis which demonstrates an acute 9th L rib fracture as the cause of the patients pain.  There is also ground glass opacities noted in the R lung.  She is currently satting 92% on RA.  Review of Systems:  ROS As per HPI otherwise 10 point review of systems negative.     Past Medical History: Past Medical History:  Diagnosis Date  . Anxiety   . Arthritis   . Cataract   . Depression   . Diabetes mellitus without complication ([place])   . Glaucoma   . Hyperlipidemia   . Hypertension   . Neuromuscular disorder ([place])   . Osteoporosis   . Substance abuse     Past Surgical History: Past Surgical History:  Procedure Laterality Date  . EYE SURGERY    . KNEE SURGERY       Allergies:   Allergies  Allergen Reactions  . Sitagliptin Phosphate     REACTION: nausea     Social History:  reports that she has been smoking.  She has a 106.00 pack-year smoking history. She has never used smokeless tobacco. She reports that she does not drink alcohol or use drugs.   Family History: Family History  Problem Relation Age of Onset  . Heart disease Father     Physical Exam: Vitals:   01/22/16 1743 01/22/16 1904 01/22/16 1904  01/22/16 1941  BP: 131/68   (!) 109/47  Pulse: 67   70  Resp: 16   18  Temp: 98.1 F (36.7 C)     TempSrc: Oral     SpO2: 90% (!) 88% 95% 94%    Constitutional: Alert and awake, oriented x3, not in any acute distress. Eyes: PERLA, EOMI, irises appear normal, anicteric sclera,  ENMT: external ears and nose appear normal Lips appears normal, oropharynx mucosa, tongue, posterior pharynx appear normal  Neck: neck appears normal, no masses, normal ROM, no thyromegaly, no JVD  CVS: S1-S2 clear, no murmur rubs or gallops, no LE edema, normal pedal pulses  Respiratory:  clear to auscultation bilaterally, no wheezing, rales or rhonchi. Respiratory effort normal. No accessory muscle use.  Abdomen: L lateral ribs have substantial guarding and tenderness. Musculoskeletal: : no cyanosis, clubbing or edema noted bilaterally Neuro: Cranial nerves II-XII intact, strength, sensation, reflexes Psych: judgement and insight appear normal, stable mood and affect, mental status Skin: no rashes or lesions or ulcers, no induration or nodules     Data reviewed:  I have personally reviewed following labs and imaging studies Labs:  CBC:  Recent Labs Lab 01/22/16 1620 01/22/16 1824  WBC 10.7* 10.8*  NEUTROABS  --  5.9  HGB 15.1 15.0  HCT 43.8 45.7  MCV 89.5 92.5  PLT  --  [id number]    Basic Metabolic Panel:  Recent Labs Lab 01/22/16 1824  NA 136  K 4.3  CL 104  CO2 27  GLUCOSE 236*  BUN 18  CREATININE 0.82  CALCIUM 9.8   GFR Estimated Creatinine Clearance: 69.3 mL/min (by C-G formula based on SCr of 0.82 mg/dL). Liver Function Tests:  Recent Labs Lab 01/22/16 1824  AST 19  ALT 21  ALKPHOS 80  BILITOT 0.3  PROT 7.9  ALBUMIN 4.1    Recent Labs Lab 01/22/16 1824  LIPASE 23   No results for input(s): AMMONIA in the last 168 hours. Coagulation profile No results for input(s): INR, PROTIME in the last 168 hours.  Cardiac Enzymes: No results for input(s): CKTOTAL, CKMB,  CKMBINDEX, TROPONINI in the last 168 hours. BNP: Invalid input(s): POCBNP CBG: No results for input(s): GLUCAP in the last 168 hours. D-Dimer No results for input(s): DDIMER in the last 72 hours. Hgb A1c No results for input(s): HGBA1C in the last 72 hours. Lipid Profile No results for input(s): CHOL, HDL, LDLCALC, TRIG, CHOLHDL, LDLDIRECT in the last 72 hours. Thyroid function studies No results for input(s): TSH, T4TOTAL, T3FREE, THYROIDAB in the last 72 hours.  Invalid input(s): FREET3 Anemia work up No results for input(s): VITAMINB12, FOLATE, FERRITIN, TIBC, IRON, RETICCTPCT in the last 72 hours. Urinalysis    Component Value Date/Time   COLORURINE YELLOW 10/06/2009 1652   APPEARANCEUR CLEAR 10/06/2009 1652   LABSPEC 1.035 (H) 10/06/2009 1652   PHURINE 5.5 10/06/2009 1652   GLUCOSEU >1000 (A) 10/06/2009 1652   GLUCOSEU 250 mg/dL (A) 07/10/2007 1444   HGBUR TRACE (A) 10/06/2009 1652   BILIRUBINUR negative 01/22/2016 1609   KETONESUR negative 01/22/2016 1609   KETONESUR NEGATIVE 10/06/2009 1652   PROTEINUR negative 01/22/2016 1609   PROTEINUR NEGATIVE 10/06/2009 1652   UROBILINOGEN 1.0 01/22/2016 1609   UROBILINOGEN 0.2 10/06/2009 1652   NITRITE Negative 01/22/2016 1609   NITRITE NEGATIVE 10/06/2009 1652   LEUKOCYTESUR Trace (A) 01/22/2016 1609     Microbiology No results found for this or any previous visit (from the past 240 hour(s)).     Inpatient Medications:   Scheduled Meds: . [START ON 01/23/2016] amLODipine  10 mg Oral Daily  . [START ON 01/23/2016] aspirin EC  81 mg Oral Daily  . [START ON 01/23/2016] atorvastatin  20 mg Oral Daily  . chlorproMAZINE  100 mg Oral TID  . [START ON 01/23/2016] furosemide  20 mg Oral Daily  . [START ON 01/23/2016] insulin aspart  7-9 Units Subcutaneous TID WC  . insulin glargine  17 Units Subcutaneous QHS  . iopamidol      . irbesartan  150 mg Oral Daily  . latanoprost  1 drop Both Eyes QHS  . pregabalin  100 mg Oral  TID  . sertraline  100 mg Oral Daily  . [START ON 01/23/2016] topiramate  50 mg Oral Daily   Continuous Infusions:   Radiological Exams on Admission: Ct Abdomen Pelvis W Contrast  Result Date: 01/22/2016 CLINICAL DATA:  70 year old female with left lower quadrant abdominal pain. EXAM: CT ABDOMEN AND PELVIS WITH CONTRAST TECHNIQUE: Multidetector CT imaging of the abdomen and pelvis was performed using the standard protocol following bolus administration of intravenous contrast. CONTRAST:  122mL ISOVUE-300 IOPAMIDOL (ISOVUE-300) INJECTION 61% COMPARISON:  04/24/2014 abdominal aortic sonogram. FINDINGS: Lower chest: Subpleural peripheral left lower lobe 5 mm solid pulmonary nodule (series 6/image 7). Separate solid 5  mm left lower lobe pulmonary nodule (series 6/ image 11). Patchy ground-glass opacities and minimal patchy consolidation in the right middle lobe and anterior right lower lobe. Hepatobiliary: Normal liver size. Diffuse hepatic steatosis. No liver mass. Normal gallbladder with no radiopaque cholelithiasis. No biliary ductal dilatation. Pancreas: Normal, with no mass or duct dilation. Spleen: Normal size. No mass. Adrenals/Urinary Tract: Normal adrenals. No hydronephrosis. Mild-to-moderate scarring in the upper and lower poles of the right kidney. Simple left renal cysts measuring up to 6.2 cm in the lateral lower left kidney. No right renal lesions . Normal bladder. Stomach/Bowel: Grossly normal stomach. Normal caliber small bowel with no small bowel wall thickening. Normal appendix. Mild sigmoid diverticulosis, with no large bowel wall thickening or pericolonic fat stranding. Vascular/Lymphatic: Atherosclerotic nonaneurysmal abdominal aorta. Patent portal, splenic, hepatic and renal veins. No pathologically enlarged lymph nodes in the abdomen or pelvis. Reproductive: Mildly enlarged anteverted uterus with mild contour irregularity possibly due to septate uterus versus small uterine fibroids. No  adnexal masses . Other: No pneumoperitoneum, ascites or focal fluid collection. Musculoskeletal: No aggressive appearing focal osseous lesions. Acute minimally displaced left posterior ninth rib fracture. No additional acute fracture. Mild deformity of the superior T11 vertebral body, unchanged since 04/06/2014 lumbar spine radiographs. Mild-to-moderate thoracolumbar spondylosis. IMPRESSION: 1. Acute posterior left ninth rib fracture, minimally displaced. 2. Patchy consolidation and ground-glass opacity in the right middle lobe and anterior right lower lobe, probably infectious or inflammatory. Recommend attention on follow-up chest CT in 3 months. 3. Two solid 5 mm left lower lobe pulmonary nodules. No follow-up needed if patient is low-risk (and has no known or suspected primary neoplasm). Non-contrast chest CT can be considered in 12 months if patient is high-risk. This recommendation follows the consensus statement: Guidelines for Management of Incidental Pulmonary Nodules Detected on CT Images: From the Fleischner Society 2017; Radiology 2017; 284:228-243. 4. Additional findings include aortic atherosclerosis, right renal scarring, mild sigmoid diverticulosis, and diffuse hepatic steatosis. Electronically Signed   By: Ilona Sorrel M.D.   On: 01/22/2016 19:53    Impression/Recommendations Principal Problem:   Fracture of one rib, left side, initial encounter for closed fracture Active Problems:   Cigarette smoker   Essential hypertension   COPD mixed type ([place])   Postinflammatory pulmonary fibrosis ([place])  1. Left 9th rib fx, acute - 1. Pain control (patient notes that tylenol with codine works best, and not vicodin nor percocet).  Will defer decision on this to EDP. 2. Incentive spirometry, patient should be provided with device and shown how to use. 3. No acute PNA, did warn patient of risk of development of PNA due to lack of deep breathing due to pain with this and stressed importance of IS and  deep breathing despite pain with rib fx. 4. Did warn patient she would need to return if she developed PNA symptoms. 2. Postinflammatory pulmonary fibrosis - believe that this is what is being seen on CT scan today based on patients history, HPI, lack of acute symptoms, Dr. Gustavus Bryant August office visit notes. 3. COPD - Again stressed patient needs to quit smoking 4. Chronic hypoxia with exertion - 1. Currently satting 92% on RA at rest, with exertion she will desaturate, but again this does not appear to be acute today (see Dr. Gustavus Bryant office notes in Aug). 2. May very well need home O2 3. Patient needs to see Dr. Melvyn Novas again in follow up for pulm issues 4. Did offer admission to try and set up chronic home O2  for patient: patient does not want to be admitted to hospital to set up home O2 right now, given no acute decline, 92% at rest on RA, feel that this is reasonable. 5. HTN - continue current meds 6. DM2 - continue current meds 7. Pulm nodules - needs F/U CT chest 12 months.  Actually may need dedicated CT chest anyhow in near future given fibrosis (defer to Dr. Melvyn Novas in f/u).   Thank you for this consultation.   Time Spent: 70 min  GARDNER, JARED M. D.O. [facility] 01/22/2016, 9:09 PM

## 2016-02-18 DIAGNOSIS — E1142 Type 2 diabetes mellitus with diabetic polyneuropathy: Secondary | ICD-10-CM | POA: Diagnosis not present

## 2016-02-18 DIAGNOSIS — J441 Chronic obstructive pulmonary disease with (acute) exacerbation: Secondary | ICD-10-CM | POA: Diagnosis not present

## 2016-02-18 DIAGNOSIS — E7800 Pure hypercholesterolemia, unspecified: Secondary | ICD-10-CM | POA: Diagnosis not present

## 2016-02-18 DIAGNOSIS — I10 Essential (primary) hypertension: Secondary | ICD-10-CM | POA: Diagnosis not present

## 2016-02-19 DIAGNOSIS — I10 Essential (primary) hypertension: Secondary | ICD-10-CM | POA: Diagnosis not present

## 2016-02-19 DIAGNOSIS — E1142 Type 2 diabetes mellitus with diabetic polyneuropathy: Secondary | ICD-10-CM | POA: Diagnosis not present

## 2016-03-15 DIAGNOSIS — E1142 Type 2 diabetes mellitus with diabetic polyneuropathy: Secondary | ICD-10-CM | POA: Diagnosis not present

## 2016-03-15 DIAGNOSIS — J441 Chronic obstructive pulmonary disease with (acute) exacerbation: Secondary | ICD-10-CM | POA: Diagnosis not present

## 2016-03-15 DIAGNOSIS — E7800 Pure hypercholesterolemia, unspecified: Secondary | ICD-10-CM | POA: Diagnosis not present

## 2016-03-15 DIAGNOSIS — I10 Essential (primary) hypertension: Secondary | ICD-10-CM | POA: Diagnosis not present

## 2016-03-22 DIAGNOSIS — E1142 Type 2 diabetes mellitus with diabetic polyneuropathy: Secondary | ICD-10-CM | POA: Diagnosis not present

## 2016-03-22 DIAGNOSIS — I10 Essential (primary) hypertension: Secondary | ICD-10-CM | POA: Diagnosis not present

## 2016-04-06 DIAGNOSIS — F419 Anxiety disorder, unspecified: Secondary | ICD-10-CM | POA: Diagnosis not present

## 2016-04-06 DIAGNOSIS — F259 Schizoaffective disorder, unspecified: Secondary | ICD-10-CM | POA: Diagnosis not present

## 2016-04-06 DIAGNOSIS — F331 Major depressive disorder, recurrent, moderate: Secondary | ICD-10-CM | POA: Diagnosis not present

## 2016-04-19 DIAGNOSIS — E1142 Type 2 diabetes mellitus with diabetic polyneuropathy: Secondary | ICD-10-CM | POA: Diagnosis not present

## 2016-05-07 DIAGNOSIS — E7800 Pure hypercholesterolemia, unspecified: Secondary | ICD-10-CM | POA: Diagnosis not present

## 2016-05-07 DIAGNOSIS — E1142 Type 2 diabetes mellitus with diabetic polyneuropathy: Secondary | ICD-10-CM | POA: Diagnosis not present

## 2016-05-07 DIAGNOSIS — J441 Chronic obstructive pulmonary disease with (acute) exacerbation: Secondary | ICD-10-CM | POA: Diagnosis not present

## 2016-05-07 DIAGNOSIS — I10 Essential (primary) hypertension: Secondary | ICD-10-CM | POA: Diagnosis not present

## 2016-05-20 DIAGNOSIS — E119 Type 2 diabetes mellitus without complications: Secondary | ICD-10-CM | POA: Diagnosis not present

## 2016-05-20 DIAGNOSIS — E7800 Pure hypercholesterolemia, unspecified: Secondary | ICD-10-CM | POA: Diagnosis not present

## 2016-05-20 DIAGNOSIS — I10 Essential (primary) hypertension: Secondary | ICD-10-CM | POA: Diagnosis not present

## 2016-06-14 DIAGNOSIS — E119 Type 2 diabetes mellitus without complications: Secondary | ICD-10-CM | POA: Diagnosis not present

## 2016-06-14 DIAGNOSIS — K5900 Constipation, unspecified: Secondary | ICD-10-CM | POA: Diagnosis not present

## 2016-06-14 DIAGNOSIS — I10 Essential (primary) hypertension: Secondary | ICD-10-CM | POA: Diagnosis not present

## 2016-06-15 DIAGNOSIS — E7800 Pure hypercholesterolemia, unspecified: Secondary | ICD-10-CM | POA: Diagnosis not present

## 2016-06-15 DIAGNOSIS — E1142 Type 2 diabetes mellitus with diabetic polyneuropathy: Secondary | ICD-10-CM | POA: Diagnosis not present

## 2016-06-15 DIAGNOSIS — I10 Essential (primary) hypertension: Secondary | ICD-10-CM | POA: Diagnosis not present

## 2016-06-15 DIAGNOSIS — J441 Chronic obstructive pulmonary disease with (acute) exacerbation: Secondary | ICD-10-CM | POA: Diagnosis not present

## 2016-06-28 DIAGNOSIS — E119 Type 2 diabetes mellitus without complications: Secondary | ICD-10-CM | POA: Diagnosis not present

## 2016-06-28 DIAGNOSIS — E139 Other specified diabetes mellitus without complications: Secondary | ICD-10-CM | POA: Diagnosis not present

## 2016-06-28 DIAGNOSIS — J309 Allergic rhinitis, unspecified: Secondary | ICD-10-CM | POA: Diagnosis not present

## 2016-06-28 DIAGNOSIS — K5900 Constipation, unspecified: Secondary | ICD-10-CM | POA: Diagnosis not present

## 2016-07-05 DIAGNOSIS — F419 Anxiety disorder, unspecified: Secondary | ICD-10-CM | POA: Diagnosis not present

## 2016-07-05 DIAGNOSIS — F331 Major depressive disorder, recurrent, moderate: Secondary | ICD-10-CM | POA: Diagnosis not present

## 2016-07-05 DIAGNOSIS — F259 Schizoaffective disorder, unspecified: Secondary | ICD-10-CM | POA: Diagnosis not present

## 2016-07-19 DIAGNOSIS — H04123 Dry eye syndrome of bilateral lacrimal glands: Secondary | ICD-10-CM | POA: Diagnosis not present

## 2016-07-19 DIAGNOSIS — H21232 Degeneration of iris (pigmentary), left eye: Secondary | ICD-10-CM | POA: Diagnosis not present

## 2016-07-19 DIAGNOSIS — H401312 Pigmentary glaucoma, right eye, moderate stage: Secondary | ICD-10-CM | POA: Diagnosis not present

## 2016-07-20 DIAGNOSIS — I10 Essential (primary) hypertension: Secondary | ICD-10-CM | POA: Diagnosis not present

## 2016-07-20 DIAGNOSIS — J441 Chronic obstructive pulmonary disease with (acute) exacerbation: Secondary | ICD-10-CM | POA: Diagnosis not present

## 2016-07-20 DIAGNOSIS — E7800 Pure hypercholesterolemia, unspecified: Secondary | ICD-10-CM | POA: Diagnosis not present

## 2016-07-20 DIAGNOSIS — E1142 Type 2 diabetes mellitus with diabetic polyneuropathy: Secondary | ICD-10-CM | POA: Diagnosis not present

## 2016-08-05 DIAGNOSIS — J309 Allergic rhinitis, unspecified: Secondary | ICD-10-CM | POA: Diagnosis not present

## 2016-08-05 DIAGNOSIS — E119 Type 2 diabetes mellitus without complications: Secondary | ICD-10-CM | POA: Diagnosis not present

## 2016-08-17 DIAGNOSIS — I10 Essential (primary) hypertension: Secondary | ICD-10-CM | POA: Diagnosis not present

## 2016-08-17 DIAGNOSIS — E1142 Type 2 diabetes mellitus with diabetic polyneuropathy: Secondary | ICD-10-CM | POA: Diagnosis not present

## 2016-08-17 DIAGNOSIS — J441 Chronic obstructive pulmonary disease with (acute) exacerbation: Secondary | ICD-10-CM | POA: Diagnosis not present

## 2016-08-17 DIAGNOSIS — E7800 Pure hypercholesterolemia, unspecified: Secondary | ICD-10-CM | POA: Diagnosis not present

## 2016-08-19 DIAGNOSIS — H04123 Dry eye syndrome of bilateral lacrimal glands: Secondary | ICD-10-CM | POA: Diagnosis not present

## 2016-09-13 DIAGNOSIS — E1142 Type 2 diabetes mellitus with diabetic polyneuropathy: Secondary | ICD-10-CM | POA: Diagnosis not present

## 2016-09-13 DIAGNOSIS — J309 Allergic rhinitis, unspecified: Secondary | ICD-10-CM | POA: Diagnosis not present

## 2016-09-13 DIAGNOSIS — L089 Local infection of the skin and subcutaneous tissue, unspecified: Secondary | ICD-10-CM | POA: Diagnosis not present

## 2016-09-15 DIAGNOSIS — M79671 Pain in right foot: Secondary | ICD-10-CM | POA: Diagnosis not present

## 2016-09-21 DIAGNOSIS — E1142 Type 2 diabetes mellitus with diabetic polyneuropathy: Secondary | ICD-10-CM | POA: Diagnosis not present

## 2016-09-21 DIAGNOSIS — L089 Local infection of the skin and subcutaneous tissue, unspecified: Secondary | ICD-10-CM | POA: Diagnosis not present

## 2016-09-23 DIAGNOSIS — E7800 Pure hypercholesterolemia, unspecified: Secondary | ICD-10-CM | POA: Diagnosis not present

## 2016-09-23 DIAGNOSIS — J441 Chronic obstructive pulmonary disease with (acute) exacerbation: Secondary | ICD-10-CM | POA: Diagnosis not present

## 2016-09-23 DIAGNOSIS — E1142 Type 2 diabetes mellitus with diabetic polyneuropathy: Secondary | ICD-10-CM | POA: Diagnosis not present

## 2016-09-23 DIAGNOSIS — I10 Essential (primary) hypertension: Secondary | ICD-10-CM | POA: Diagnosis not present

## 2016-09-24 DIAGNOSIS — L97519 Non-pressure chronic ulcer of other part of right foot with unspecified severity: Secondary | ICD-10-CM | POA: Diagnosis not present

## 2016-09-24 DIAGNOSIS — L97512 Non-pressure chronic ulcer of other part of right foot with fat layer exposed: Secondary | ICD-10-CM | POA: Diagnosis not present

## 2016-09-24 DIAGNOSIS — M869 Osteomyelitis, unspecified: Secondary | ICD-10-CM | POA: Diagnosis not present

## 2016-09-28 DIAGNOSIS — L089 Local infection of the skin and subcutaneous tissue, unspecified: Secondary | ICD-10-CM | POA: Diagnosis not present

## 2016-09-28 DIAGNOSIS — E1142 Type 2 diabetes mellitus with diabetic polyneuropathy: Secondary | ICD-10-CM | POA: Diagnosis not present

## 2016-09-29 DIAGNOSIS — S91109A Unspecified open wound of unspecified toe(s) without damage to nail, initial encounter: Secondary | ICD-10-CM | POA: Diagnosis not present

## 2016-10-01 DIAGNOSIS — M869 Osteomyelitis, unspecified: Secondary | ICD-10-CM | POA: Diagnosis not present

## 2016-10-01 DIAGNOSIS — L97512 Non-pressure chronic ulcer of other part of right foot with fat layer exposed: Secondary | ICD-10-CM | POA: Diagnosis not present

## 2016-10-04 DIAGNOSIS — F331 Major depressive disorder, recurrent, moderate: Secondary | ICD-10-CM | POA: Diagnosis not present

## 2016-10-04 DIAGNOSIS — F259 Schizoaffective disorder, unspecified: Secondary | ICD-10-CM | POA: Diagnosis not present

## 2016-10-04 DIAGNOSIS — F419 Anxiety disorder, unspecified: Secondary | ICD-10-CM | POA: Diagnosis not present

## 2016-10-05 ENCOUNTER — Ambulatory Visit (INDEPENDENT_AMBULATORY_CARE_PROVIDER_SITE_OTHER): Payer: Medicare Other | Admitting: Orthopedic Surgery

## 2016-10-05 DIAGNOSIS — E1142 Type 2 diabetes mellitus with diabetic polyneuropathy: Secondary | ICD-10-CM | POA: Insufficient documentation

## 2016-10-05 DIAGNOSIS — M86671 Other chronic osteomyelitis, right ankle and foot: Secondary | ICD-10-CM

## 2016-10-05 NOTE — Progress Notes (Signed)
Office Visit Note   Patient: [name]           Date of Birth: [birth year]           MRN: [id number] Visit Date: 10/05/2016              Requested by: Javier Docker, MD [address] [place], [place] [address] PCP: Javier Docker, MD  Chief Complaint  Patient presents with  . Right Foot - Open Wound      HPI: Patient is a 71 year old woman who presents with a chronic and osteomyelitis right foot second toe. Patient is a type II diabetic uncontrolled hemoglobin A1c greater than 9. Patient smokes and states that she will not consider stopping smoking. Past medical history is updated review of systems positive for schizophrenia anxiety arthritis depression diabetes glaucoma osteoporosis and hypertension.  Assessment & Plan: Visit Diagnoses:  1. Diabetic polyneuropathy associated with type 2 diabetes mellitus ([place])   2. Chronic osteomyelitis of toe, right (HCC)     Plan: Recommended proceeding with amputation right foot second toe at the MTP joint. Discussed that with her uncontrolled diabetes and smoking she is an increased risk of the wound not healing increased risk of a higher level amputation. Patient states that she will not consider stopping smoking. We will plan for surgery on Friday patient request afternoon time outpatient surgery minimize weightbearing on the right foot and follow-up in the office 1 week postoperatively.  Follow-Up Instructions: Return in about 1 week (around 10/12/2016).   Ortho Exam  Patient is alert, oriented, no adenopathy, well-dressed, normal affect, normal respiratory effort. Examination patient has an antalgic gait she has a good dorsalis pedis pulse she has sausage digit swelling of the second toe consistent with chronic infection. She has an ulcer which probes down to bone at the tip of the right foot second toe radiographs are reviewed which shows chronic destructive changes of the tuft of the toe consistent with  chronic osteomyelitis. There is no ascending cellulitis.  Imaging: No results found. No images are attached to the encounter.  Labs: Lab Results  Component Value Date   HGBA1C 9.9 (H) 07/10/2007   HGBA1C 10.7 (H) 03/16/2007   ESRSEDRATE 45 (H) 05/11/2007    Orders:  No orders of the defined types were placed in this encounter.  No orders of the defined types were placed in this encounter.    Procedures: No procedures performed  Clinical Data: No additional findings.  ROS:  All other systems negative, except as noted in the HPI. Review of Systems  Objective: Vital Signs: There were no vitals taken for this visit.  Specialty Comments:  No specialty comments available.  PMFS History: Patient Active Problem List   Diagnosis Date Noted  . Diabetic polyneuropathy associated with type 2 diabetes mellitus ([place]) 10/05/2016  . Chronic osteomyelitis of toe, right ([place]) 10/05/2016  . Fracture of one rib, left side, initial encounter for closed fracture 01/22/2016  . Postinflammatory pulmonary fibrosis ([place]) 09/21/2015  . COPD mixed type ([place]) 09/18/2015  . FREQUENCY, URINARY 07/10/2007  . 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 Cigarette smoker 02/23/2007  . Essential hypertension 02/23/2007  . DIAB W/O MENTION COMP TYPE II/UNS TYPE UNCNTRL 01/20/2007  . HYPERLIPIDEMIA 01/20/2007  . HEADACHE 01/20/2007   Past Medical History:  Diagnosis Date  . Anxiety   . Arthritis   . Cataract   . Depression   . Diabetes mellitus without complication ([place])   . Glaucoma   .  Hyperlipidemia   . Hypertension   . Neuromuscular disorder ([place])   . Osteoporosis   . Substance abuse     Family History  Problem Relation Age of Onset  . Heart disease Father     Past Surgical History:  Procedure Laterality Date  . EYE SURGERY    . KNEE SURGERY     Social History   Occupational History  . Not on file.   Social History Main Topics  . Smoking status: Current Every Day Smoker     Packs/day: 2.00    Years: 53.00  . Smokeless tobacco: Never Used  . Alcohol use No  . Drug use: No  . Sexual activity: Not on file

## 2016-10-06 ENCOUNTER — Emergency Department (HOSPITAL_COMMUNITY)
Admission: EM | Admit: 2016-10-06 | Discharge: 2016-10-06 | Disposition: A | Discharge status: Home / Self Care | Payer: Medicare Other | Attending: Emergency Medicine | Admitting: Emergency Medicine

## 2016-10-06 ENCOUNTER — Telehealth (INDEPENDENT_AMBULATORY_CARE_PROVIDER_SITE_OTHER): Payer: Self-pay | Admitting: Orthopedic Surgery

## 2016-10-06 ENCOUNTER — Encounter (HOSPITAL_COMMUNITY): Payer: Self-pay | Admitting: Emergency Medicine

## 2016-10-06 DIAGNOSIS — I10 Essential (primary) hypertension: Secondary | ICD-10-CM | POA: Diagnosis not present

## 2016-10-06 DIAGNOSIS — Z7982 Long term (current) use of aspirin: Secondary | ICD-10-CM | POA: Insufficient documentation

## 2016-10-06 DIAGNOSIS — M869 Osteomyelitis, unspecified: Secondary | ICD-10-CM | POA: Diagnosis not present

## 2016-10-06 DIAGNOSIS — Z794 Long term (current) use of insulin: Secondary | ICD-10-CM | POA: Insufficient documentation

## 2016-10-06 DIAGNOSIS — F17200 Nicotine dependence, unspecified, uncomplicated: Secondary | ICD-10-CM | POA: Insufficient documentation

## 2016-10-06 DIAGNOSIS — J449 Chronic obstructive pulmonary disease, unspecified: Secondary | ICD-10-CM | POA: Insufficient documentation

## 2016-10-06 DIAGNOSIS — E1142 Type 2 diabetes mellitus with diabetic polyneuropathy: Secondary | ICD-10-CM | POA: Insufficient documentation

## 2016-10-06 DIAGNOSIS — Z79899 Other long term (current) drug therapy: Secondary | ICD-10-CM | POA: Insufficient documentation

## 2016-10-06 DIAGNOSIS — M79674 Pain in right toe(s): Secondary | ICD-10-CM | POA: Diagnosis present

## 2016-10-06 MED ORDER — CLINDAMYCIN HCL 300 MG PO CAPS: 300.0000 mg | ORAL_CAPSULE | Freq: Three times a day (TID) | ORAL | 0 refills | Status: DC

## 2016-10-06 NOTE — ED Notes (Signed)
Pt states she thinks he toe was responding to oral antibiotics but was told it needed to be amputated and she doesn't want that

## 2016-10-06 NOTE — Discharge Instructions (Signed)
Continue antibiotics. Please call and follow up with Dr. Sharol Given for further treatment.

## 2016-10-06 NOTE — Telephone Encounter (Signed)
I called [name] to discuss her toe amputation that is scheduled at [facility] for this Friday 10/08/2016 with Dr Sharol Given.  There was no answer.  I left a message on her answering machine for her to please return my call.  Upon pulling [name] up in [facility] it appears that she is in the [facility] at present time.  If she is admitted to [facility], she will need to be transported to [facility] for her scheduled surgery.

## 2016-10-06 NOTE — ED Provider Notes (Signed)
[facility] DEPT Provider Note   CSN: [id number] Arrival date & time: 10/06/16  1526     History   Chief Complaint Chief Complaint  Patient presents with  . Toe Pain    HPI [name] is a 71 y.o. female.  HPI [name] is a 71 y.o. female presents to emergency department complaining of pain to her right second toe. Patient states she has had an ulcer to the bottom of the toe, and was diagnosed with chronic osteomyelitis of the toe. She was seen by her orthopedic doctor who referred her to Dr. Sharol Given. She saw Dr. Sharol Given yesterday who recommended amputation. She is scheduled for surgery in 2 days. She is here for second opinion. She states she would like to save her toe. She is currently on antibiotics and states that it is improving. She would like to be admitted for IV antibiotics so that she would try not to have her toe amputated. She denies any fever or chills. She denies any pain or swelling to her foot. She has no other complaints.  Past Medical History:  Diagnosis Date  . Anxiety   . Arthritis   . Cataract   . Depression   . Diabetes mellitus without complication ([place])   . Glaucoma   . Hyperlipidemia   . Hypertension   . Neuromuscular disorder ([place])   . Osteoporosis   . Substance abuse     Patient Active Problem List   Diagnosis Date Noted  . Diabetic polyneuropathy associated with type 2 diabetes mellitus ([place]) 10/05/2016  . Chronic osteomyelitis of toe, right ([place]) 10/05/2016  . Fracture of one rib, left side, initial encounter for closed fracture 01/22/2016  . Postinflammatory pulmonary fibrosis ([place]) 09/21/2015  . COPD mixed type ([place]) 09/18/2015  . FREQUENCY, URINARY 07/10/2007  . 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 Cigarette smoker 02/23/2007  . Essential hypertension 02/23/2007  . DIAB W/O MENTION COMP TYPE II/UNS TYPE UNCNTRL 01/20/2007  . HYPERLIPIDEMIA 01/20/2007  . HEADACHE 01/20/2007    Past Surgical History:  Procedure Laterality  Date  . EYE SURGERY    . KNEE SURGERY      OB History    No data available       Home Medications    Prior to Admission medications   Medication Sig Start Date End Date Taking? Authorizing Provider  acetaminophen-codeine (TYLENOL #4) 300-60 MG tablet Take 1 tablet by mouth every 6 (six) hours as needed for moderate pain or severe pain.  09/26/16  Yes [provider]  amLODipine (NORVASC) 10 MG tablet Take 10 mg by mouth daily.   Yes [provider]  aspirin EC 81 MG tablet Take 81 mg by mouth daily.   Yes [provider]  atorvastatin (LIPITOR) 20 MG tablet Take 20 mg by mouth daily.   Yes [provider]  bimatoprost (LUMIGAN) 0.03 % ophthalmic solution Place 1 drop into both eyes at bedtime.   Yes [provider]  chlorproMAZINE (THORAZINE) 100 MG tablet Take 100 mg by mouth at bedtime.    Yes [provider]  hydrOXYzine (ATARAX/VISTARIL) 10 MG tablet Take 10 mg by mouth 2 (two) times daily.    Yes [provider]  insulin glargine (LANTUS) 100 UNIT/ML injection Inject 24 Units into the skin at bedtime.    Yes [provider]  NOVOLOG FLEXPEN 100 UNIT/ML FlexPen Inject 7-9 Units into the skin 3 (three) times daily with meals. Take 7 units in the am, 9 units at  lunch and 9 units at bedtime. 11/15/15  Yes [provider]  pregabalin (LYRICA) 100 MG capsule Take 100 mg by mouth 3 (three) times daily.   Yes [provider]  sertraline (ZOLOFT) 100 MG tablet Take 100 mg by mouth daily.   Yes [provider]  topiramate (TOPAMAX) 50 MG tablet Take 50 mg by mouth daily.  01/03/16  Yes [provider]  valsartan (DIOVAN) 160 MG tablet Take 160 mg by mouth daily.   Yes [provider]  acetaminophen-codeine (TYLENOL #3) 300-30 MG tablet Take 1-2 tablets by mouth every 6 (six) hours as needed for moderate pain. Patient not taking: Reported on 10/06/2016 01/22/16   [name],  MD  BD PEN NEEDLE NANO U/F 32G X 4 MM MISC  12/23/15   [provider]  ibuprofen (ADVIL,MOTRIN) 200 MG tablet Take 400 mg by mouth every 6 (six) hours as needed for moderate pain.    [provider]    Family History Family History  Problem Relation Age of Onset  . Heart disease Father     Social History Social History  Substance Use Topics  . Smoking status: Current Every Day Smoker    Packs/day: 2.00    Years: 53.00  . Smokeless tobacco: Never Used  . Alcohol use No     Allergies   Sitagliptin phosphate   Review of Systems Review of Systems  Constitutional: Negative for chills and fever.  Musculoskeletal: Positive for arthralgias and myalgias.  Skin: Positive for color change.  Neurological: Negative for weakness and numbness.     Physical Exam Updated Vital Signs BP (!) 99/56 (BP Location: Left Arm)   Pulse 71   Temp 97.7 F (36.5 C) (Oral)   Resp 18   Wt 90.7 kg (200 lb)   SpO2 90%   BMI 36.00 kg/m   Physical Exam  Constitutional: She appears well-developed and well-nourished. No distress.  HENT:  Head: Normocephalic.  Eyes: Conjunctivae are normal.  Neck: Neck supple.  Cardiovascular: Normal rate, regular rhythm and normal heart sounds.   Pulmonary/Chest: Effort normal and breath sounds normal. No respiratory distress. She has no wheezes. She has no rales.  Musculoskeletal: She exhibits no edema.  Right second toe with ulceration to the bottom of the distal phalanx. No drainage. There is diffuse swelling of the entire toe. No tenderness to palpation. Capillary refill less than 2 seconds distally. Dorsalis pedis pulses intact in the right foot.  Neurological: She is alert.  Skin: Skin is warm and dry.  Psychiatric: She has a normal mood and affect. Her behavior is normal.  Nursing note and vitals reviewed.    ED Treatments / Results  Labs (all labs ordered are listed, but only abnormal results are displayed) Labs Reviewed - No  data to display  EKG  EKG Interpretation None       Radiology No results found.  Procedures Procedures (including critical care time)  Medications Ordered in ED Medications - No data to display   Initial Impression / Assessment and Plan / ED Course  I have reviewed the triage vital signs and the nursing notes.  Pertinent labs & imaging results that were available during my care of the patient were reviewed by me and considered in my medical decision making (see chart for details).     Pt in ED with pain, swelling, ulceration to the right 2nd toe. CD of toe with pt, was able pull it up, showing bony destruction of the distal  phalanx. Patient is afebrile. And states that her toe is improving with oral antibiotics. Patient is demanding to be admitted to the hospital for IV antibiotics. Discussed with Dr. Vanita Panda who will come and see patient.  11:14 PM Patient had a long discussion with Dr. Vanita Panda regarding her toe infection. Agreed she does not need admission at this time, she is afebrile, non toxic. No cellulitis. Will dc home with refilled anitbiotics. Follow up with Dr. Sharol Given for further treatment.   Vitals:   10/06/16 1554 10/06/16 2245  BP: (!) 99/56 (!) 157/113  Pulse: 71 68  Resp: 18   Temp: 97.7 F (36.5 C) 97.9 F (36.6 C)  TempSrc: Oral Oral  SpO2: 90% 90%  Weight: 90.7 kg (200 lb)     Final Clinical Impressions(s) / ED Diagnoses   Final diagnoses:  Osteomyelitis of toe [facility])    New Prescriptions New Prescriptions   No medications on file     Jeannett Senior, Hershal Coria 10/06/16 2317    [name], MD 10/06/16 2354

## 2016-10-06 NOTE — ED Triage Notes (Signed)
Pt states she has an infection in her 2nd toe on her right foot and she was seen by her ortho dr yesterday and he told her it would need to be amputated   Pt wants a second opinion and states she wants some IV antibiotics  Pt is currently on po antibiotics

## 2016-10-07 ENCOUNTER — Telehealth (INDEPENDENT_AMBULATORY_CARE_PROVIDER_SITE_OTHER): Payer: Self-pay | Admitting: Orthopedic Surgery

## 2016-10-07 ENCOUNTER — Other Ambulatory Visit (INDEPENDENT_AMBULATORY_CARE_PROVIDER_SITE_OTHER): Payer: Self-pay | Admitting: Family

## 2016-10-07 NOTE — Telephone Encounter (Signed)
[name] returned my call this morning.  She advised that she went to [facility] Room yesterday in hopes of "saving her toe".  She states that she is not ready to make a decision about going through with her scheduled amputation.  She asked that I cancel her surgery that is scheduled for tomorrow 10/08/16 and give her more time to think about it and consult with her family.  She would like to keep her follow up appointment in our office for next week and have [place] recheck her at that time.

## 2016-10-08 ENCOUNTER — Ambulatory Visit (HOSPITAL_COMMUNITY): Admission: RE | Admit: 2016-10-08 | Payer: Medicare Other | Source: Ambulatory Visit | Admitting: Orthopedic Surgery

## 2016-10-08 ENCOUNTER — Encounter (HOSPITAL_COMMUNITY): Admission: RE | Payer: Self-pay | Source: Ambulatory Visit

## 2016-10-08 SURGERY — AMPUTATION DIGIT
Anesthesia: Choice | Laterality: Right

## 2016-10-12 DIAGNOSIS — J441 Chronic obstructive pulmonary disease with (acute) exacerbation: Secondary | ICD-10-CM | POA: Diagnosis not present

## 2016-10-12 DIAGNOSIS — E7800 Pure hypercholesterolemia, unspecified: Secondary | ICD-10-CM | POA: Diagnosis not present

## 2016-10-12 DIAGNOSIS — I10 Essential (primary) hypertension: Secondary | ICD-10-CM | POA: Diagnosis not present

## 2016-10-12 DIAGNOSIS — E1142 Type 2 diabetes mellitus with diabetic polyneuropathy: Secondary | ICD-10-CM | POA: Diagnosis not present

## 2016-10-14 ENCOUNTER — Ambulatory Visit (INDEPENDENT_AMBULATORY_CARE_PROVIDER_SITE_OTHER): Payer: Medicare Other | Admitting: Orthopedic Surgery

## 2016-10-14 DIAGNOSIS — M86671 Other chronic osteomyelitis, right ankle and foot: Secondary | ICD-10-CM | POA: Diagnosis not present

## 2016-10-14 DIAGNOSIS — E1142 Type 2 diabetes mellitus with diabetic polyneuropathy: Secondary | ICD-10-CM | POA: Diagnosis not present
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
## 2016-10-18 ENCOUNTER — Telehealth (INDEPENDENT_AMBULATORY_CARE_PROVIDER_SITE_OTHER): Payer: Self-pay | Admitting: Orthopedic Surgery

## 2016-10-18 NOTE — Telephone Encounter (Signed)
Patient called wanting to set up her surgery. CB # [phone]

## 2016-10-20 ENCOUNTER — Ambulatory Visit: Payer: Medicare Other | Admitting: Podiatry

## 2016-10-25 ENCOUNTER — Other Ambulatory Visit (INDEPENDENT_AMBULATORY_CARE_PROVIDER_SITE_OTHER): Payer: Self-pay | Admitting: Family

## 2016-10-27 ENCOUNTER — Encounter (HOSPITAL_COMMUNITY): Payer: Self-pay | Admitting: *Deleted

## 2016-10-27 NOTE — Progress Notes (Signed)
Pt denies any acute cardiopulmonary issues. Pt not under the care of a cardiologist. Pt denies having a stress test, echo and cardiac cath. Pt denies having recent labs. Pt denies having a chest x ray and EKG within the last year. Pt stated that she prefers to take her Aspirin after the procedure and not the morning of. Pt made aware to stop taking vitamins, fish oil, and herbal medications. Do not take any NSAIDs ie: Ibuprofen, Advil, Naproxen (Aleve), Motrin, BC and Goody Powder. Pt made aware to take 12 units of Lantus insulin the night before surgery. Pt made aware to check BG every 2 hours prior to arrival to hospital on DOS. Pt made aware to treat a BG < 70 with  4 ounces of apple juice, wait 15 minutes after intervention to recheck BG, if BG remains < 70, call Short Stay unit to speak with a nurse. Pt made aware to take half of SS (3 units) of Novolog insulin the morning of procedure for a BG > 220. Pt verbalized understanding of all pre-op instructions.

## 2016-10-29 ENCOUNTER — Ambulatory Visit (HOSPITAL_COMMUNITY)
Admission: RE | Admit: 2016-10-29 | Discharge: 2016-10-29 | Disposition: A | Discharge status: Home / Self Care | Payer: Medicare Other | Source: Ambulatory Visit | Attending: Orthopedic Surgery | Admitting: Orthopedic Surgery

## 2016-10-29 ENCOUNTER — Ambulatory Visit (HOSPITAL_COMMUNITY): Payer: Medicare Other | Admitting: Anesthesiology

## 2016-10-29 ENCOUNTER — Encounter (HOSPITAL_COMMUNITY): Payer: Self-pay

## 2016-10-29 ENCOUNTER — Encounter (HOSPITAL_COMMUNITY)
Admission: RE | Disposition: A | Discharge status: Home / Self Care | Payer: Self-pay | Source: Ambulatory Visit | Attending: Orthopedic Surgery

## 2016-10-29 DIAGNOSIS — E785 Hyperlipidemia, unspecified: Secondary | ICD-10-CM | POA: Insufficient documentation

## 2016-10-29 DIAGNOSIS — E1142 Type 2 diabetes mellitus with diabetic polyneuropathy: Secondary | ICD-10-CM | POA: Insufficient documentation

## 2016-10-29 DIAGNOSIS — M810 Age-related osteoporosis without current pathological fracture: Secondary | ICD-10-CM | POA: Insufficient documentation

## 2016-10-29 DIAGNOSIS — M86671 Other chronic osteomyelitis, right ankle and foot: Secondary | ICD-10-CM

## 2016-10-29 DIAGNOSIS — F419 Anxiety disorder, unspecified: Secondary | ICD-10-CM | POA: Insufficient documentation

## 2016-10-29 DIAGNOSIS — F329 Major depressive disorder, single episode, unspecified: Secondary | ICD-10-CM | POA: Diagnosis not present

## 2016-10-29 DIAGNOSIS — M86679 Other chronic osteomyelitis, unspecified ankle and foot: Secondary | ICD-10-CM | POA: Insufficient documentation

## 2016-10-29 DIAGNOSIS — J449 Chronic obstructive pulmonary disease, unspecified: Secondary | ICD-10-CM | POA: Insufficient documentation

## 2016-10-29 DIAGNOSIS — H409 Unspecified glaucoma: Secondary | ICD-10-CM | POA: Diagnosis not present

## 2016-10-29 DIAGNOSIS — F17210 Nicotine dependence, cigarettes, uncomplicated: Secondary | ICD-10-CM | POA: Diagnosis not present

## 2016-10-29 DIAGNOSIS — E1169 Type 2 diabetes mellitus with other specified complication: Secondary | ICD-10-CM | POA: Insufficient documentation

## 2016-10-29 DIAGNOSIS — M1990 Unspecified osteoarthritis, unspecified site: Secondary | ICD-10-CM | POA: Diagnosis not present

## 2016-10-29 DIAGNOSIS — F209 Schizophrenia, unspecified: Secondary | ICD-10-CM | POA: Insufficient documentation

## 2016-10-29 DIAGNOSIS — I10 Essential (primary) hypertension: Secondary | ICD-10-CM | POA: Insufficient documentation

## 2016-10-29 HISTORY — DX: Headache: R51

## 2016-10-29 HISTORY — DX: Osteomyelitis, unspecified: M86.9

## 2016-10-29 HISTORY — PX: AMPUTATION: SHX166

## 2016-10-29 HISTORY — DX: Headache, unspecified: R51.9

## 2016-10-29 HISTORY — DX: Schizophrenia, unspecified: F20.9

## 2016-10-29 HISTORY — DX: Chronic obstructive pulmonary disease, unspecified: J44.9

## 2016-10-29 LAB — CBC
HCT: 43.3 % (ref 36.0–46.0)
HEMOGLOBIN: 14.2 g/dL (ref 12.0–15.0)
MCH: 29.2 pg (ref 26.0–34.0)
MCHC: 32.8 g/dL (ref 30.0–36.0)
MCV: 88.9 fL (ref 78.0–100.0)
PLATELETS: 252 10*3/uL (ref 150–400)
RBC: 4.87 MIL/uL (ref 3.87–5.11)
RDW: 15.1 % (ref 11.5–15.5)
WBC: 8.2 10*3/uL (ref 4.0–10.5)

## 2016-10-29 LAB — GLUCOSE, CAPILLARY
Glucose-Capillary: 204 mg/dL — ABNORMAL HIGH (ref 65–99)
Glucose-Capillary: 172 mg/dL — ABNORMAL HIGH (ref 65–99)
Glucose-Capillary: 173 mg/dL — ABNORMAL HIGH (ref 65–99)

## 2016-10-29 LAB — BASIC METABOLIC PANEL
ANION GAP: 6 (ref 5–15)
BUN: 13 mg/dL (ref 6–20)
CALCIUM: 9.3 mg/dL (ref 8.9–10.3)
CO2: 25 mmol/L (ref 22–32)
Chloride: 107 mmol/L (ref 101–111)
Creatinine, Ser: 0.77 mg/dL (ref 0.44–1.00)
GFR calc Af Amer: >60 mL/min (ref >60)
GLUCOSE: 180 mg/dL — AB (ref 65–99)
Potassium: 4 mmol/L (ref 3.5–5.1)
SODIUM: 138 mmol/L (ref 135–145)

## 2016-10-29 LAB — HEMOGLOBIN A1C
HEMOGLOBIN A1C: 7.8 % — AB (ref 4.8–5.6)
MEAN PLASMA GLUCOSE: 177.16 mg/dL

## 2016-10-29 SURGERY — AMPUTATION DIGIT
Anesthesia: Monitor Anesthesia Care | Laterality: Right

## 2016-10-29 MED ORDER — 0.9 % SODIUM CHLORIDE (POUR BTL) OPTIME
TOPICAL | Status: DC | PRN
  Administered 2016-10-29: 1000 mL

## 2016-10-29 MED ORDER — CEFAZOLIN SODIUM-DEXTROSE 2-4 GM/100ML-% IV SOLN
2.0000 g | INTRAVENOUS | Status: AC
  Administered 2016-10-29: 2 g via INTRAVENOUS
  Filled 2016-10-29: qty 100

## 2016-10-29 MED ORDER — MIDAZOLAM HCL 2 MG/2ML IJ SOLN
INTRAMUSCULAR | Status: AC
  Administered 2016-10-29: 1 mg via INTRAVENOUS
  Filled 2016-10-29: qty 2

## 2016-10-29 MED ORDER — PROPOFOL 10 MG/ML IV BOLUS
INTRAVENOUS | Status: DC | PRN
  Administered 2016-10-29 (×2): 20 mg via INTRAVENOUS

## 2016-10-29 MED ORDER — MIDAZOLAM HCL 2 MG/2ML IJ SOLN
INTRAMUSCULAR | Status: AC
  Filled 2016-10-29: qty 2

## 2016-10-29 MED ORDER — CHLORHEXIDINE GLUCONATE 4 % EX LIQD: 60.0000 mL | Freq: Once | CUTANEOUS | Status: DC

## 2016-10-29 MED ORDER — FENTANYL CITRATE (PF) 100 MCG/2ML IJ SOLN: 25.0000 ug | INTRAMUSCULAR | Status: DC | PRN

## 2016-10-29 MED ORDER — ONDANSETRON HCL 4 MG/2ML IJ SOLN
INTRAMUSCULAR | Status: AC
  Filled 2016-10-29: qty 2

## 2016-10-29 MED ORDER — HYDROCODONE-ACETAMINOPHEN 5-325 MG PO TABS: 1.0000 | ORAL_TABLET | Freq: Four times a day (QID) | ORAL | 0 refills | Status: DC | PRN

## 2016-10-29 MED ORDER — FENTANYL CITRATE (PF) 100 MCG/2ML IJ SOLN
INTRAMUSCULAR | Status: DC | PRN
  Administered 2016-10-29: 25 ug via INTRAVENOUS

## 2016-10-29 MED ORDER — LACTATED RINGERS IV SOLN
INTRAVENOUS | Status: DC | PRN
  Administered 2016-10-29: 12:00:00 via INTRAVENOUS

## 2016-10-29 MED ORDER — PROPOFOL 10 MG/ML IV BOLUS
INTRAVENOUS | Status: AC
  Filled 2016-10-29: qty 20

## 2016-10-29 MED ORDER — FENTANYL CITRATE (PF) 100 MCG/2ML IJ SOLN
100.0000 ug | Freq: Once | INTRAMUSCULAR | Status: AC
  Administered 2016-10-29: 100 ug via INTRAVENOUS

## 2016-10-29 MED ORDER — ONDANSETRON HCL 4 MG/2ML IJ SOLN
INTRAMUSCULAR | Status: DC | PRN
  Administered 2016-10-29: 4 mg via INTRAVENOUS

## 2016-10-29 MED ORDER — ROPIVACAINE HCL 7.5 MG/ML IJ SOLN
INTRAMUSCULAR | Status: DC | PRN
  Administered 2016-10-29: 20 mL

## 2016-10-29 MED ORDER — FENTANYL CITRATE (PF) 250 MCG/5ML IJ SOLN
INTRAMUSCULAR | Status: AC
  Filled 2016-10-29: qty 5

## 2016-10-29 MED ORDER — FENTANYL CITRATE (PF) 100 MCG/2ML IJ SOLN
INTRAMUSCULAR | Status: AC
  Administered 2016-10-29: 100 ug via INTRAVENOUS
  Filled 2016-10-29: qty 2

## 2016-10-29 MED ORDER — LIDOCAINE-EPINEPHRINE 2 %-1:100000 IJ SOLN
INTRAMUSCULAR | Status: DC | PRN
  Administered 2016-10-29: 8 mL

## 2016-10-29 MED ORDER — LACTATED RINGERS IV SOLN
INTRAVENOUS | Status: DC
  Administered 2016-10-29: 10:00:00 via INTRAVENOUS

## 2016-10-29 MED ORDER — MIDAZOLAM HCL 2 MG/2ML IJ SOLN
1.0000 mg | Freq: Once | INTRAMUSCULAR | Status: AC
Start: 2016-10-29 — End: 2016-10-29
  Administered 2016-10-29: 1 mg via INTRAVENOUS

## 2016-10-29 SURGICAL SUPPLY — 35 items
BLADE SURG 21 STRL SS (BLADE) ×3 IMPLANT
BNDG COHESIVE 4X5 TAN STRL (GAUZE/BANDAGES/DRESSINGS) ×3 IMPLANT
BNDG ESMARK 4X9 LF (GAUZE/BANDAGES/DRESSINGS) IMPLANT
BNDG GAUZE ELAST 4 BULKY (GAUZE/BANDAGES/DRESSINGS) ×3 IMPLANT
COVER SURGICAL LIGHT HANDLE (MISCELLANEOUS) ×3 IMPLANT
DRAPE U-SHAPE 47X51 STRL (DRAPES) ×3 IMPLANT
DRSG ADAPTIC 3X8 NADH LF (GAUZE/BANDAGES/DRESSINGS) ×3 IMPLANT
DRSG PAD ABDOMINAL 8X10 ST (GAUZE/BANDAGES/DRESSINGS) ×3 IMPLANT
DURAPREP 26ML APPLICATOR (WOUND CARE) ×3 IMPLANT
ELECT REM PT RETURN 9FT ADLT (ELECTROSURGICAL) ×3
ELECTRODE REM PT RTRN 9FT ADLT (ELECTROSURGICAL) ×1 IMPLANT
GAUZE SPONGE 4X4 12PLY STRL (GAUZE/BANDAGES/DRESSINGS) ×3 IMPLANT
GLOVE BIOGEL M 6.5 STRL (GLOVE) ×3 IMPLANT
GLOVE BIOGEL PI IND STRL 6 (GLOVE) ×1 IMPLANT
GLOVE BIOGEL PI IND STRL 7.5 (GLOVE) ×1 IMPLANT
GLOVE BIOGEL PI IND STRL 9 (GLOVE) ×1 IMPLANT
GLOVE BIOGEL PI INDICATOR 6 (GLOVE) ×2
GLOVE BIOGEL PI INDICATOR 7.5 (GLOVE) ×2
GLOVE BIOGEL PI INDICATOR 9 (GLOVE) ×2
GLOVE SURG ORTHO 9.0 STRL STRW (GLOVE) ×3 IMPLANT
GLOVE SURG SS PI 6.5 STRL IVOR (GLOVE) ×3 IMPLANT
GOWN STRL REUS W/ TWL LRG LVL4 (GOWN DISPOSABLE) ×1 IMPLANT
GOWN STRL REUS W/ TWL XL LVL3 (GOWN DISPOSABLE) ×2 IMPLANT
GOWN STRL REUS W/TWL LRG LVL4 (GOWN DISPOSABLE) ×2
GOWN STRL REUS W/TWL XL LVL3 (GOWN DISPOSABLE) ×4
KIT BASIN OR (CUSTOM PROCEDURE TRAY) ×3 IMPLANT
KIT ROOM TURNOVER OR (KITS) ×3 IMPLANT
MANIFOLD NEPTUNE II (INSTRUMENTS) IMPLANT
NEEDLE 22X1 1/2 (OR ONLY) (NEEDLE) IMPLANT
NS IRRIG 1000ML POUR BTL (IV SOLUTION) ×3 IMPLANT
PACK ORTHO EXTREMITY (CUSTOM PROCEDURE TRAY) ×3 IMPLANT
PAD ARMBOARD 7.5X6 YLW CONV (MISCELLANEOUS) ×6 IMPLANT
SUT ETHILON 2 0 PSLX (SUTURE) ×3 IMPLANT
SYR CONTROL 10ML LL (SYRINGE) IMPLANT
TOWEL OR 17X26 10 PK STRL BLUE (TOWEL DISPOSABLE) ×3 IMPLANT

## 2016-10-29 NOTE — Anesthesia Preprocedure Evaluation (Addendum)
Anesthesia Evaluation  Patient identified by MRN, date of birth, ID band Patient awake    Reviewed: Allergy & Precautions, H&P , Patient's Chart, lab work & pertinent test results, reviewed documented beta blocker date and time   Airway Mallampati: II  TM Distance: >3 FB Neck ROM: full    Dental no notable dental hx.    Pulmonary Current Smoker,    Pulmonary exam normal breath sounds clear to auscultation       Cardiovascular hypertension,  Rhythm:regular Rate:Normal     Neuro/Psych    GI/Hepatic   Endo/Other  diabetes  Renal/GU      Musculoskeletal   Abdominal   Peds  Hematology   Anesthesia Other Findings   Reproductive/Obstetrics                             Anesthesia Physical Anesthesia Plan  ASA: II  Anesthesia Plan: MAC   Post-op Pain Management:  Regional for Post-op pain   Induction: Intravenous  PONV Risk Score and Plan:   Airway Management Planned: Mask  Additional Equipment:   Intra-op Plan:   Post-operative Plan:   Informed Consent: I have reviewed the patients History and Physical, chart, labs and discussed the procedure including the risks, benefits and alternatives for the proposed anesthesia with the patient or authorized representative who has indicated his/her understanding and acceptance.   Dental Advisory Given  Plan Discussed with: CRNA and Surgeon  Anesthesia Plan Comments: ( Ankle block ; LMA if necessary)       Anesthesia Quick Evaluation

## 2016-10-29 NOTE — Anesthesia Postprocedure Evaluation (Signed)
Anesthesia Post Note  Patient: [name]  Procedure(s) Performed: Right Foot 2nd Toe Amputation at Metatarsophalangeal Joint (Right )     Patient location during evaluation: PACU Anesthesia Type: MAC Level of consciousness: awake and alert Pain management: pain level controlled Vital Signs Assessment: post-procedure vital signs reviewed and stable Respiratory status: spontaneous breathing, nonlabored ventilation, respiratory function stable and patient connected to nasal cannula oxygen Cardiovascular status: stable and blood pressure returned to baseline Postop Assessment: no apparent nausea or vomiting Anesthetic complications: no    Last Vitals:  Vitals:   10/29/16 1226 10/29/16 1240  BP: (!) 115/59 133/81  Pulse: 61 (!) 59  Resp: 14 17  Temp:    SpO2: 97% 96%    Last Pain:  Vitals:   10/29/16 0957  TempSrc: Oral                 Ariyonna Twichell EDWARD
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
## 2016-10-29 NOTE — Anesthesia Procedure Notes (Signed)
Anesthesia Regional Block: Ankle block   Pre-Anesthetic Checklist: ,, timeout performed, Correct Patient, Correct Site, Correct Laterality, Correct Procedure, Correct Position, site marked, Risks and benefits discussed, pre-op evaluation,  At surgeon's request and post-op pain management  Laterality: Right  Prep: chloraprep        Narrative:  Start time: 10/29/2016 11:24 AM End time: 10/29/2016 11:28 AM Injection made incrementally with aspirations every 5 mL. Anesthesiologist: Lyndle Herrlich

## 2016-10-29 NOTE — Transfer of Care (Signed)
Immediate Anesthesia Transfer of Care Note  Patient: [name]  Procedure(s) Performed: Right Foot 2nd Toe Amputation at Metatarsophalangeal Joint (Right )  Patient Location: PACU  Anesthesia Type:MAC  Level of Consciousness: awake  Airway & Oxygen Therapy: Patient Spontanous Breathing and Patient connected to nasal cannula oxygen  Post-op Assessment: Report given to RN and Post -op Vital signs reviewed and stable  Post vital signs: Reviewed and stable  Last Vitals:  Vitals:   10/29/16 1130 10/29/16 1223  BP: (!) 105/51   Pulse: (!) 58   Resp: 12   Temp:  (P) 36.4 C  SpO2: 95%     Last Pain:  Vitals:   10/29/16 0957  TempSrc: Oral      Patients Stated Pain Goal: 3 (62/83/66 2947)  Complications: No apparent anesthesia complications

## 2016-10-29 NOTE — Op Note (Signed)
10/29/2016  12:18 PM  PATIENT:  [name]    PRE-OPERATIVE DIAGNOSIS:  Osteomyelitis of the Right 2nd Toe  POST-OPERATIVE DIAGNOSIS:  Same  PROCEDURE:  Right Foot 2nd Toe Amputation at Metatarsophalangeal Joint  SURGEON:  Newt Minion, MD  PHYSICIAN ASSISTANT:None ANESTHESIA:   General  PREOPERATIVE INDICATIONS:  LOVELYN SHEERAN is a  71 y.o. female with a diagnosis of Osteomyelitis of the Right 2nd Toe who failed conservative measures and elected for surgical management.    The risks benefits and alternatives were discussed with the patient preoperatively including but not limited to the risks of infection, bleeding, nerve injury, cardiopulmonary complications, the need for revision surgery, among others, and the patient was willing to proceed.  OPERATIVE IMPLANTS: none   OPERATIVE FINDINGS: good bleeding no abscess  OPERATIVE PROCEDURE: Patient is a 71 year old woman who presents for amputation of the second toe right foot secondary to osteomyelitis. Patient underwent a digital block and it was brought to the operating room. After adequate levels anesthesia obtained patient's right lower extremity was prepped using DuraPrep draped into a sterile field a timeout was called. A V incision was made around the toe distal to the MTP joint. The toe was amputated through the MTP joint. The wound was irrigated with normal saline electrocautery was used for hemostasis. The incision was closed using 2-0 nylon. A sterile dressing was applied patient was taken the PACU in stable condition.

## 2016-10-30 ENCOUNTER — Encounter (HOSPITAL_COMMUNITY): Payer: Self-pay | Admitting: Orthopedic Surgery

## 2016-11-05 ENCOUNTER — Ambulatory Visit (INDEPENDENT_AMBULATORY_CARE_PROVIDER_SITE_OTHER): Payer: Medicare Other | Admitting: Family

## 2016-11-05 ENCOUNTER — Encounter (INDEPENDENT_AMBULATORY_CARE_PROVIDER_SITE_OTHER): Payer: Self-pay | Admitting: Family

## 2016-11-05 DIAGNOSIS — Z89421 Acquired absence of other right toe(s): Secondary | ICD-10-CM | POA: Insufficient documentation
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
## 2016-11-18 ENCOUNTER — Ambulatory Visit (INDEPENDENT_AMBULATORY_CARE_PROVIDER_SITE_OTHER): Payer: Medicare Other | Admitting: Orthopedic Surgery

## 2016-11-18 DIAGNOSIS — Z89421 Acquired absence of other right toe(s): Secondary | ICD-10-CM

## 2016-11-18 DIAGNOSIS — E1142 Type 2 diabetes mellitus with diabetic polyneuropathy: Secondary | ICD-10-CM

## 2016-11-18 MED ORDER — HYDROCODONE-ACETAMINOPHEN 5-325 MG PO TABS: 1.0000 | ORAL_TABLET | Freq: Two times a day (BID) | ORAL | 0 refills | Status: DC | PRN

## 2016-11-18 NOTE — Progress Notes (Signed)
   Post-Op Visit Note   Patient: [name]           Date of Birth: 1945/02/18           MRN: [id number] Visit Date: 11/18/2016 PCP: Javier Docker, MD  Chief Complaint:  Chief Complaint  Patient presents with  . Right Foot - Follow-up    HPI:  HPI Patient is a 71 year old woman seen today 20 days status post right second toe amputation. Is doing ok. Complains of burning pain over dorsum of foot around incision. Requesting refill on pain  medication. Ortho Exam Incision well approximated with sutures, healing well. No drainage. Mild surrounding erythema. No cellulitis. No sign of infection.  Visit Diagnoses:  1. History of amputation of lesser toe of right foot ([place])   2. Diabetic polyneuropathy associated with type 2 diabetes mellitus ([place])     Plan: sutures harvested today. Follow up in office in 2 more weeks. Anticipate releasing her at that time.   Follow-Up Instructions: Return in about 2 weeks (around 12/02/2016).   Imaging: No results found.  Orders:  No orders of the defined types were placed in this encounter.  No orders of the defined types were placed in this encounter.    PMFS History: Patient Active Problem List   Diagnosis Date Noted  . History of amputation of lesser toe of right foot ([place]) 11/05/2016  . Diabetic polyneuropathy associated with type 2 diabetes mellitus ([place]) 10/05/2016  . Fracture of one rib, left side, initial encounter for closed fracture 01/22/2016  . Postinflammatory pulmonary fibrosis ([place]) 09/21/2015  . COPD mixed type ([place]) 09/18/2015  . FREQUENCY, URINARY 07/10/2007  . 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 Cigarette smoker 02/23/2007  . Essential hypertension 02/23/2007  . DIAB W/O MENTION COMP TYPE II/UNS TYPE UNCNTRL 01/20/2007  . HYPERLIPIDEMIA 01/20/2007  . HEADACHE 01/20/2007   Past Medical History:  Diagnosis Date  . Anxiety   . Arthritis   . Cataract   . COPD (chronic obstructive pulmonary disease)  ([place])   . Depression   . Diabetes mellitus without complication ([place])   . Glaucoma   . Headache    "migraines im my 20's"  . Hyperlipidemia   . Hypertension   . Neuromuscular disorder ([place])   . Osteomyelitis ([place])    right second toe  . Osteoporosis   . Schizophrenia ([place])    schizoaffective  . Substance abuse ([place])     Family History  Problem Relation Age of Onset  . Heart disease Father     Past Surgical History:  Procedure Laterality Date  . AMPUTATION Right 10/29/2016   Procedure: Right Foot 2nd Toe Amputation at Metatarsophalangeal Joint;  Surgeon: Newt Minion, MD;  Location: [place];  Service: Orthopedics;  Laterality: Right;  . EYE SURGERY    . KNEE SURGERY    . MULTIPLE TOOTH EXTRACTIONS    . VASCULAR SURGERY     vein surgery   Social History   Occupational History  . Not on file.   Social History Main Topics  . Smoking status: Current Every Day Smoker    Packs/day: 2.00    Years: 53.00    Types: Cigarettes  . Smokeless tobacco: Never Used  . Alcohol use No  . Drug use: No  . Sexual activity: Not on file

## 2016-12-01 DIAGNOSIS — H401312 Pigmentary glaucoma, right eye, moderate stage: Secondary | ICD-10-CM | POA: Diagnosis not present

## 2016-12-02 ENCOUNTER — Encounter (INDEPENDENT_AMBULATORY_CARE_PROVIDER_SITE_OTHER): Payer: Self-pay | Admitting: Family

## 2016-12-02 ENCOUNTER — Ambulatory Visit (INDEPENDENT_AMBULATORY_CARE_PROVIDER_SITE_OTHER): Payer: Medicare Other | Admitting: Family

## 2016-12-02 DIAGNOSIS — Z89421 Acquired absence of other right toe(s): Secondary | ICD-10-CM

## 2016-12-02 NOTE — Progress Notes (Signed)
   Post-Op Visit Note   Patient: [name]           Date of Birth: 12-13-1945           MRN: [id number] Visit Date: 12/02/2016 PCP: Javier Docker, MD  Chief Complaint:  No chief complaint on file.   HPI:  HPI Patient is a 71 year old woman seen today status post right second toe amputation. Is doing ok. Hope she can get out of her post op shoe and resume her normal activities. Has been applying ace wrap directly over incision.  Ortho Exam Incision well healed. No drainage. No surrounding erythema. No cellulitis. No sign of infection.  Visit Diagnoses:  1. History of amputation of lesser toe of right foot ([place])     Plan: May resume regular shoe wear. Advance activities as tolerated. Call with any concerns.    Follow-Up Instructions: Return if symptoms worsen or fail to improve.   Imaging: No results found.  Orders:  No orders of the defined types were placed in this encounter.  No orders of the defined types were placed in this encounter.    PMFS History: Patient Active Problem List   Diagnosis Date Noted  . History of amputation of lesser toe of right foot ([place]) 11/05/2016  . Diabetic polyneuropathy associated with type 2 diabetes mellitus ([place]) 10/05/2016  . Fracture of one rib, left side, initial encounter for closed fracture 01/22/2016  . Postinflammatory pulmonary fibrosis ([place]) 09/21/2015  . COPD mixed type ([place]) 09/18/2015  . FREQUENCY, URINARY 07/10/2007  . 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 Cigarette smoker 02/23/2007  . Essential hypertension 02/23/2007  . DIAB W/O MENTION COMP TYPE II/UNS TYPE UNCNTRL 01/20/2007  . HYPERLIPIDEMIA 01/20/2007  . HEADACHE 01/20/2007   Past Medical History:  Diagnosis Date  . Anxiety   . Arthritis   . Cataract   . COPD (chronic obstructive pulmonary disease) ([place])   . Depression   . Diabetes mellitus without complication ([place])   . Glaucoma   . Headache    "migraines im my 20's"  . Hyperlipidemia     . Hypertension   . Neuromuscular disorder ([place])   . Osteomyelitis ([place])    right second toe  . Osteoporosis   . Schizophrenia ([place])    schizoaffective  . Substance abuse ([place])     Family History  Problem Relation Age of Onset  . Heart disease Father     Past Surgical History:  Procedure Laterality Date  . EYE SURGERY    . KNEE SURGERY    . MULTIPLE TOOTH EXTRACTIONS    . VASCULAR SURGERY     vein surgery   Social History   Occupational History  . Not on file  Tobacco Use  . Smoking status: Current Every Day Smoker    Packs/day: 2.00    Years: 53.00    Pack years: 106.00    Types: Cigarettes  . Smokeless tobacco: Never Used  Substance and Sexual Activity  . Alcohol use: No    Alcohol/week: 0.0 oz  . Drug use: No  . Sexual activity: Not on file

## 2016-12-23 DIAGNOSIS — F251 Schizoaffective disorder, depressive type: Secondary | ICD-10-CM | POA: Diagnosis not present

## 2016-12-23 DIAGNOSIS — E782 Mixed hyperlipidemia: Secondary | ICD-10-CM | POA: Diagnosis not present

## 2016-12-23 DIAGNOSIS — J449 Chronic obstructive pulmonary disease, unspecified: Secondary | ICD-10-CM | POA: Diagnosis not present

## 2016-12-23 DIAGNOSIS — G629 Polyneuropathy, unspecified: Secondary | ICD-10-CM | POA: Diagnosis not present

## 2016-12-23 DIAGNOSIS — E1142 Type 2 diabetes mellitus with diabetic polyneuropathy: Secondary | ICD-10-CM | POA: Diagnosis not present

## 2016-12-23 DIAGNOSIS — H409 Unspecified glaucoma: Secondary | ICD-10-CM | POA: Diagnosis not present

## 2016-12-23 DIAGNOSIS — Z794 Long term (current) use of insulin: Secondary | ICD-10-CM | POA: Diagnosis not present

## 2016-12-23 DIAGNOSIS — I10 Essential (primary) hypertension: Secondary | ICD-10-CM | POA: Diagnosis not present

## 2016-12-28 DIAGNOSIS — F331 Major depressive disorder, recurrent, moderate: Secondary | ICD-10-CM | POA: Diagnosis not present

## 2016-12-28 DIAGNOSIS — F419 Anxiety disorder, unspecified: Secondary | ICD-10-CM | POA: Diagnosis not present

## 2016-12-28 DIAGNOSIS — F259 Schizoaffective disorder, unspecified: Secondary | ICD-10-CM | POA: Diagnosis not present

## 2017-03-25 DIAGNOSIS — F331 Major depressive disorder, recurrent, moderate: Secondary | ICD-10-CM | POA: Diagnosis not present

## 2017-03-25 DIAGNOSIS — F419 Anxiety disorder, unspecified: Secondary | ICD-10-CM | POA: Diagnosis not present

## 2017-03-25 DIAGNOSIS — F259 Schizoaffective disorder, unspecified: Secondary | ICD-10-CM | POA: Diagnosis not present

## 2017-04-07 DIAGNOSIS — Z794 Long term (current) use of insulin: Secondary | ICD-10-CM | POA: Diagnosis not present

## 2017-04-07 DIAGNOSIS — Z1231 Encounter for screening mammogram for malignant neoplasm of breast: Secondary | ICD-10-CM | POA: Diagnosis not present

## 2017-04-07 DIAGNOSIS — F251 Schizoaffective disorder, depressive type: Secondary | ICD-10-CM | POA: Diagnosis not present

## 2017-04-07 DIAGNOSIS — L989 Disorder of the skin and subcutaneous tissue, unspecified: Secondary | ICD-10-CM | POA: Diagnosis not present

## 2017-04-07 DIAGNOSIS — E1142 Type 2 diabetes mellitus with diabetic polyneuropathy: Secondary | ICD-10-CM | POA: Diagnosis not present

## 2017-04-15 ENCOUNTER — Other Ambulatory Visit: Payer: Self-pay | Admitting: Family Medicine

## 2017-04-15 DIAGNOSIS — Z1231 Encounter for screening mammogram for malignant neoplasm of breast: Secondary | ICD-10-CM

## 2017-04-21 DIAGNOSIS — D485 Neoplasm of uncertain behavior of skin: Secondary | ICD-10-CM | POA: Diagnosis not present

## 2017-04-27 DIAGNOSIS — L821 Other seborrheic keratosis: Secondary | ICD-10-CM | POA: Diagnosis not present

## 2017-05-04 DIAGNOSIS — L821 Other seborrheic keratosis: Secondary | ICD-10-CM | POA: Diagnosis not present

## 2017-05-31 DIAGNOSIS — H401312 Pigmentary glaucoma, right eye, moderate stage: Secondary | ICD-10-CM | POA: Diagnosis not present

## 2017-06-07 ENCOUNTER — Ambulatory Visit (INDEPENDENT_AMBULATORY_CARE_PROVIDER_SITE_OTHER): Payer: Medicare Other | Admitting: Orthopedic Surgery

## 2017-06-07 ENCOUNTER — Encounter (INDEPENDENT_AMBULATORY_CARE_PROVIDER_SITE_OTHER): Payer: Self-pay | Admitting: Orthopedic Surgery

## 2017-06-07 DIAGNOSIS — Z89421 Acquired absence of other right toe(s): Secondary | ICD-10-CM

## 2017-06-07 DIAGNOSIS — L97521 Non-pressure chronic ulcer of other part of left foot limited to breakdown of skin: Secondary | ICD-10-CM | POA: Diagnosis not present

## 2017-06-07 DIAGNOSIS — I87323 Chronic venous hypertension (idiopathic) with inflammation of bilateral lower extremity: Secondary | ICD-10-CM | POA: Diagnosis not present

## 2017-06-07 DIAGNOSIS — E1142 Type 2 diabetes mellitus with diabetic polyneuropathy: Secondary | ICD-10-CM | POA: Diagnosis not present

## 2017-06-07 DIAGNOSIS — F17200 Nicotine dependence, unspecified, uncomplicated: Secondary | ICD-10-CM

## 2017-06-07 MED ORDER — ACETAMINOPHEN-CODEINE #3 300-30 MG PO TABS: 1.0000 | ORAL_TABLET | Freq: Three times a day (TID) | ORAL | 0 refills | Status: DC | PRN

## 2017-06-07 NOTE — Progress Notes (Signed)
Office Visit Note   Patient: [name]           Date of Birth: December 11, 1945           MRN: [id number] Visit Date: 06/07/2017              Requested by: Javier Docker, MD [address] [place], [place] [address] PCP: Javier Docker, MD  No chief complaint on file.     HPI: Patient is a 72 year old woman presents with multiple medical problems she is status post amputation second toe right foot she has a new ulcer on the second toe left foot venous stasis swelling with heel cord contracture with diabetic insensate neuropathy.  Patient states she is still smoking and will not quit smoking.  Assessment & Plan: Visit Diagnoses:  1. History of amputation of lesser toe of right foot ([place])   2. Diabetic polyneuropathy associated with type 2 diabetes mellitus ([place])   3. Idiopathic chronic venous hypertension of both lower extremities with inflammation   4. Smoker   5. Ulcer of toe of left foot, limited to breakdown of skin ([place])     Plan: Patient has been given a prescription for Tylenol with codeine.  She was given instruction and demonstrated heel cord stretching.  Recommended Hoka sneakers to unload her forefoot.  Recommended compression stockings recommended smoking cessation discussed that with out these conservative measures patient's ulcer on the second toe would get worse and she could require amputation.  Follow-Up Instructions: Return in about 1 month (around 07/05/2017).   Ortho Exam  Patient is alert, oriented, no adenopathy, well-dressed, normal affect, normal respiratory effort. Examination patient has a good dorsalis pedis pulse she does have heel cord tightness with dorsiflexion just short of neutral with her knee extended.  She has an ulcer over the tip of the second toe but there is no redness no cellulitis the ulcer is 5 mm in diameter 0.1 mm deep does not probe to bone or tendon.  Patient has pitting edema with tight venous stasis changes.   Patient has insensate neuropathy with uncontrolled diabetes.  Imaging: No results found. No images are attached to the encounter.  Labs: Lab Results  Component Value Date   HGBA1C 7.8 (H) 10/29/2016   HGBA1C 9.9 (H) 07/10/2007   HGBA1C 10.7 (H) 03/16/2007   ESRSEDRATE 45 (H) 05/11/2007     Lab Results  Component Value Date   ALBUMIN 4.1 01/22/2016   ALBUMIN 4.3 10/06/2009   ALBUMIN 4.0 07/10/2007    There is no height or weight on file to calculate BMI.  Orders:  No orders of the defined types were placed in this encounter.  Meds ordered this encounter  Medications  . acetaminophen-codeine (TYLENOL #3) 300-30 MG tablet    Sig: Take 1 tablet by mouth every 8 (eight) hours as needed for moderate pain.    Dispense:  30 tablet    Refill:  0     Procedures: No procedures performed  Clinical Data: No additional findings.  ROS:  All other systems negative, except as noted in the HPI. Review of Systems  Objective: Vital Signs: There were no vitals taken for this visit.  Specialty Comments:  No specialty comments available.  PMFS History: Patient Active Problem List   Diagnosis Date Noted  . Idiopathic chronic venous hypertension of both lower extremities with inflammation 06/07/2017  . Ulcer of toe of left foot, limited to breakdown of skin ([place]) 06/07/2017  .  History of amputation of lesser toe of right foot ([place]) 11/05/2016  . Diabetic polyneuropathy associated with type 2 diabetes mellitus ([place]) 10/05/2016  . Fracture of one rib, left side, initial encounter for closed fracture 01/22/2016  . Postinflammatory pulmonary fibrosis ([place]) 09/21/2015  . COPD mixed type ([place]) 09/18/2015  . FREQUENCY, URINARY 07/10/2007  . 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 Smoker 02/23/2007  . Essential hypertension 02/23/2007  . DIAB W/O MENTION COMP TYPE II/UNS TYPE UNCNTRL 01/20/2007  . HYPERLIPIDEMIA 01/20/2007  . HEADACHE 01/20/2007   Past Medical History:    Diagnosis Date  . Anxiety   . Arthritis   . Cataract   . COPD (chronic obstructive pulmonary disease) ([place])   . Depression   . Diabetes mellitus without complication ([place])   . Glaucoma   . Headache    "migraines im my 20's"  . Hyperlipidemia   . Hypertension   . Neuromuscular disorder ([place])   . Osteomyelitis ([place])    right second toe  . Osteoporosis   . Schizophrenia ([place])    schizoaffective  . Substance abuse ([place])     Family History  Problem Relation Age of Onset  . Heart disease Father     Past Surgical History:  Procedure Laterality Date  . AMPUTATION Right 10/29/2016   Procedure: Right Foot 2nd Toe Amputation at Metatarsophalangeal Joint;  Surgeon: Newt Minion, MD;  Location: [facility];  Service: Orthopedics;  Laterality: Right;  . EYE SURGERY    . KNEE SURGERY    . MULTIPLE TOOTH EXTRACTIONS    . VASCULAR SURGERY     vein surgery   Social History   Occupational History  . Not on file  Tobacco Use  . Smoking status: Current Every Day Smoker    Packs/day: 2.00    Years: 53.00    Pack years: 106.00    Types: Cigarettes  . Smokeless tobacco: Never Used  Substance and Sexual Activity  . Alcohol use: No    Alcohol/week: 0.0 oz  . Drug use: No  . Sexual activity: Not on file

## 2017-06-14 DIAGNOSIS — F259 Schizoaffective disorder, unspecified: Secondary | ICD-10-CM | POA: Diagnosis not present

## 2017-06-14 DIAGNOSIS — F419 Anxiety disorder, unspecified: Secondary | ICD-10-CM | POA: Diagnosis not present

## 2017-06-14 DIAGNOSIS — F331 Major depressive disorder, recurrent, moderate: Secondary | ICD-10-CM | POA: Diagnosis not present

## 2017-07-12 ENCOUNTER — Ambulatory Visit (INDEPENDENT_AMBULATORY_CARE_PROVIDER_SITE_OTHER): Payer: Medicare Other | Admitting: Orthopedic Surgery

## 2017-07-12 ENCOUNTER — Encounter (INDEPENDENT_AMBULATORY_CARE_PROVIDER_SITE_OTHER): Payer: Self-pay | Admitting: Orthopedic Surgery

## 2017-07-12 VITALS — Ht 62.0 in | Wt 200.0 lb

## 2017-07-12 DIAGNOSIS — F17200 Nicotine dependence, unspecified, uncomplicated: Secondary | ICD-10-CM | POA: Diagnosis not present

## 2017-07-12 DIAGNOSIS — Z89421 Acquired absence of other right toe(s): Secondary | ICD-10-CM | POA: Diagnosis not present

## 2017-07-12 DIAGNOSIS — E1142 Type 2 diabetes mellitus with diabetic polyneuropathy: Secondary | ICD-10-CM

## 2017-07-12 DIAGNOSIS — I87323 Chronic venous hypertension (idiopathic) with inflammation of bilateral lower extremity: Secondary | ICD-10-CM | POA: Diagnosis not present

## 2017-07-12 NOTE — Progress Notes (Signed)
Office Visit Note   Patient: [name]           Date of Birth: Apr 30, 1945           MRN: [id number] Visit Date: 07/12/2017              Requested by: Javier Docker, MD [address] [place], [place] [address] PCP: Kristen Loader, FNP  Chief Complaint  Patient presents with  . Right Foot - Follow-up    10/29/16 right foot 2nd toe amputation at MTP joint       HPI: Patient is a 72 year old woman diabetic insensate neuropathy smoker with venous insufficiency status post right foot second toe amputation previously with an ulcer on the left foot second toe.  She is currently wearing Hoka sneakers.  Assessment & Plan: Visit Diagnoses:  1. History of amputation of lesser toe of right foot ([place])   2. Diabetic polyneuropathy associated with type 2 diabetes mellitus ([place])   3. Idiopathic chronic venous hypertension of both lower extremities with inflammation   4. Smoker     Plan: Continue wearing the [employer].  Discussed the importance of wearing her medical compression stockings start in the morning and slowly increase the time she wears them.  Follow-Up Instructions: Return if symptoms worsen or fail to improve.   Ortho Exam  Patient is alert, oriented, no adenopathy, well-dressed, normal affect, normal respiratory effort. Examination both feet show no redness no cellulitis no open ulcers.  The second toe ulcer has healed on the left foot.  She still has pitting edema in both legs with venous insufficiency.  There are no open ulcers no cellulitis.  Imaging: No results found. No images are attached to the encounter.  Labs: Lab Results  Component Value Date   HGBA1C 7.8 (H) 10/29/2016   HGBA1C 9.9 (H) 07/10/2007   HGBA1C 10.7 (H) 03/16/2007   ESRSEDRATE 45 (H) 05/11/2007     Lab Results  Component Value Date   ALBUMIN 4.1 01/22/2016   ALBUMIN 4.3 10/06/2009   ALBUMIN 4.0 07/10/2007    Body mass index is 36.58 kg/m.  Orders:  No  orders of the defined types were placed in this encounter.  No orders of the defined types were placed in this encounter.    Procedures: No procedures performed  Clinical Data: No additional findings.  ROS:  All other systems negative, except as noted in the HPI. Review of Systems  Objective: Vital Signs: Ht 5\' 2"  (1.575 m)   Wt 200 lb (90.7 kg)   BMI 36.58 kg/m   Specialty Comments:  No specialty comments available.  PMFS History: Patient Active Problem List   Diagnosis Date Noted  . Idiopathic chronic venous hypertension of both lower extremities with inflammation 06/07/2017  . Ulcer of toe of left foot, limited to breakdown of skin ([place]) 06/07/2017  . History of amputation of lesser toe of right foot ([place]) 11/05/2016  . Diabetic polyneuropathy associated with type 2 diabetes mellitus ([place]) 10/05/2016  . Fracture of one rib, left side, initial encounter for closed fracture 01/22/2016  . Postinflammatory pulmonary fibrosis ([place]) 09/21/2015  . COPD mixed type ([place]) 09/18/2015  . FREQUENCY, URINARY 07/10/2007  . 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 Smoker 02/23/2007  . Essential hypertension 02/23/2007  . DIAB W/O MENTION COMP TYPE II/UNS TYPE UNCNTRL 01/20/2007  . HYPERLIPIDEMIA 01/20/2007  . HEADACHE 01/20/2007   Past Medical History:  Diagnosis Date  . Anxiety   . Arthritis   .  Cataract   . COPD (chronic obstructive pulmonary disease) ([place])   . Depression   . Diabetes mellitus without complication ([place])   . Glaucoma   . Headache    "migraines im my 20's"  . Hyperlipidemia   . Hypertension   . Neuromuscular disorder ([place])   . Osteomyelitis ([place])    right second toe  . Osteoporosis   . Schizophrenia ([place])    schizoaffective  . Substance abuse ([place])     Family History  Problem Relation Age of Onset  . Heart disease Father     Past Surgical History:  Procedure Laterality Date  . AMPUTATION Right 10/29/2016   Procedure: Right Foot 2nd Toe  Amputation at Metatarsophalangeal Joint;  Surgeon: Newt Minion, MD;  Location: [facility];  Service: Orthopedics;  Laterality: Right;  . EYE SURGERY    . KNEE SURGERY    . MULTIPLE TOOTH EXTRACTIONS    . VASCULAR SURGERY     vein surgery   Social History   Occupational History  . Not on file  Tobacco Use  . Smoking status: Current Every Day Smoker    Packs/day: 2.00    Years: 53.00    Pack years: 106.00    Types: Cigarettes  . Smokeless tobacco: Never Used  Substance and Sexual Activity  . Alcohol use: No    Alcohol/week: 0.0 oz  . Drug use: No  . Sexual activity: Not on file

## 2017-07-14 DIAGNOSIS — I10 Essential (primary) hypertension: Secondary | ICD-10-CM | POA: Diagnosis not present

## 2017-07-14 DIAGNOSIS — E1142 Type 2 diabetes mellitus with diabetic polyneuropathy: Secondary | ICD-10-CM | POA: Diagnosis not present

## 2017-08-22 ENCOUNTER — Encounter: Payer: Self-pay | Admitting: Dietician

## 2017-08-22 ENCOUNTER — Encounter: Payer: Medicare Other | Attending: Family Medicine | Admitting: Dietician

## 2017-08-22 DIAGNOSIS — Z794 Long term (current) use of insulin: Secondary | ICD-10-CM | POA: Insufficient documentation

## 2017-08-22 DIAGNOSIS — IMO0002 Reserved for concepts with insufficient information to code with codable children: Secondary | ICD-10-CM

## 2017-08-22 DIAGNOSIS — E118 Type 2 diabetes mellitus with unspecified complications: Secondary | ICD-10-CM

## 2017-08-22 DIAGNOSIS — E1165 Type 2 diabetes mellitus with hyperglycemia: Secondary | ICD-10-CM

## 2017-08-22 DIAGNOSIS — Z713 Dietary counseling and surveillance: Secondary | ICD-10-CM | POA: Insufficient documentation

## 2017-08-22 DIAGNOSIS — E1142 Type 2 diabetes mellitus with diabetic polyneuropathy: Secondary | ICD-10-CM | POA: Insufficient documentation

## 2017-08-22 NOTE — Patient Instructions (Addendum)
Drink the silver (zero carb) monster energy drink rather than regular or switch to coffee (My preference would be more water.) Drink sugar free gatorade (zero or G2), diet lemonade or other drinks without sugar. Consider changing to 2% milk and to 1% as tolerated. Breakfast:  Eat only 1 packet of oatmeal  Balance breakfast would be:   1 packet of oatmeal   1 fruit   1 slice toast (optional)   Peanut butter or 1 slice cheese or egg    OR   1 English muffin   1 egg and cheese   Fruit Lunch:  1 protein (meat or tuna)  1 slice bread or 6 crackers  1 fruit  Fry less often.  Bake or boil or grill. Have a small portion of protein each meal and snack.

## 2017-08-22 NOTE — Progress Notes (Signed)
Diabetes Self-Management Education  Visit Type: First/Initial  Appt. Start Time: 1410 Appt. End Time: 1520  08/22/2017  [name], identified by name and date of birth, is a 72 y.o. female with a diagnosis of Diabetes: Type 2. History includes polyneuropathy, Hyperlipidemia, HTN, COPD, osteoporosis, smokes, to amputation, dental issues and few teeth, and schizoaffective disorder.  Medications include Metformin, glipizide, Lantus 17 units each HS and Novolog 7 units each am and 9 units with lunch and dinner.  She states that she has problems remembering the Novolog at times. Her A1C continues to rice and was 11.3% in June and was just started on metformin and glipizide.  Patient lives alone.  She has 2 children nearby who are helpful.  She moved here 12 years ago from [facility] and is retired from the [employer].  She relies on [employer] or the city bus for transportation.  Her son will take her shopping once a week.  ASSESSMENT  Height 5' 2.5" (1.588 m), weight 202 lb (91.6 kg). Body mass index is 36.36 kg/m.  Diabetes Self-Management Education - 08/22/17 1422      Visit Information   Visit Type  First/Initial      Initial Visit   Diabetes Type  Type 2    Are you currently following a meal plan?  No    Are you taking your medications as prescribed?  Yes forgets a lot    Date Diagnosed  2008      Health Coping   How would you rate your overall health?  Poor      Psychosocial Assessment   Patient Belief/Attitude about Diabetes  Afraid    Self-care barriers  None    Self-management support  Doctor's office;Family    Other persons present  Patient    Patient Concerns  Nutrition/Meal planning;Glycemic Control    Special Needs  None    Preferred Learning Style  No preference indicated    Learning Readiness  Ready    How often do you need to have someone help you when you read instructions, pamphlets, or other written materials from your doctor or pharmacy?  1 - Never    What is the last grade level you completed in school?  12th grade      Pre-Education Assessment   Patient understands the diabetes disease and treatment process.  Needs Review    Patient understands incorporating nutritional management into lifestyle.  Needs Review    Patient undertands incorporating physical activity into lifestyle.  Needs Review    Patient understands using medications safely.  Needs Review    Patient understands monitoring blood glucose, interpreting and using results  Needs Review    Patient understands prevention, detection, and treatment of acute complications.  Needs Review    Patient understands prevention, detection, and treatment of chronic complications.  Needs Review    Patient understands how to develop strategies to address psychosocial issues.  Needs Review    Patient understands how to develop strategies to promote health/change behavior.  Needs Review      Complications   Last HgB A1C per patient/outside source  11.3 % 06/2017 increased from 10.3% 03/2017    How often do you check your blood sugar?  1-2 times/day    Fasting Blood glucose range (mg/dL)  >200    Postprandial Blood glucose range (mg/dL)  >200    Number of hypoglycemic episodes per month  0    Number of hyperglycemic episodes per week  21  Can you tell when your blood sugar is high?  Yes    What do you do if your blood sugar is high?  eats something or lie down    Have you had a dilated eye exam in the past 12 months?  Yes    Have you had a dental exam in the past 12 months?  No    Are you checking your feet?  Yes    How many days per week are you checking your feet?  4      Dietary Intake   Breakfast  Coffee with sweet and low, milk AND occasional 2-3 packs instant oatmeal often with fruit    Snack (morning)  none    Lunch  2 boiled beef hot dogs OR large can tuna in water    Snack (afternoon)  none    Dinner  fried chicken, asparagus, potatoes 7    Snack (evening)  canned fruit or  cheeze-its    Beverage(s)  Monster Energy drinks (3), water, whole milk, regular sports drink or regular lemonade      Exercise   Exercise Type  ADL's    How many days per week to you exercise?  0    How many minutes per day do you exercise?  0    Total minutes per week of exercise  0      Patient Education   Previous Diabetes Education  No    Disease state   Definition of diabetes, type 1 and 2, and the diagnosis of diabetes    Nutrition management   Role of diet in the treatment of diabetes and the relationship between the three main macronutrients and blood glucose level;Meal options for control of blood glucose level and chronic complications.;Meal timing in regards to the patients' current diabetes medication.    Physical activity and exercise   Role of exercise on diabetes management, blood pressure control and cardiac health.    Medications  Reviewed patients medication for diabetes, action, purpose, timing of dose and side effects.    Psychosocial adjustment  Worked with patient to identify barriers to care and solutions      Individualized Goals (developed by patient)   Nutrition  General guidelines for healthy choices and portions discussed    Physical Activity  Exercise 3-5 times per week;15 minutes per day    Monitoring   test my blood glucose as discussed    Reducing Risk  examine blood glucose patterns;increase portions of healthy fats    Health Coping  discuss diabetes with (comment) MD, RD, LDN      Post-Education Assessment   Patient understands the diabetes disease and treatment process.  Demonstrates understanding / competency    Patient understands incorporating nutritional management into lifestyle.  Needs Review    Patient undertands incorporating physical activity into lifestyle.  Needs Review    Patient understands using medications safely.  Demonstrates understanding / competency    Patient understands monitoring blood glucose, interpreting and using results   Demonstrates understanding / competency    Patient understands prevention, detection, and treatment of acute complications.  Demonstrates understanding / competency    Patient understands prevention, detection, and treatment of chronic complications.  Demonstrates understanding / competency    Patient understands how to develop strategies to address psychosocial issues.  Demonstrates understanding / competency    Patient understands how to develop strategies to promote health/change behavior.  Needs Review      Outcomes   Expected Outcomes  Demonstrated  interest in learning. Expect positive outcomes    Future DMSE  4-6 wks    Program Status  Completed       Individualized Plan for Diabetes Self-Management Training:   Learning Objective:  Patient will have a greater understanding of diabetes self-management. Patient education plan is to attend individual and/or group sessions per assessed needs and concerns.   Plan:   Patient Instructions  Drink the silver (zero carb) monster energy drink rather than regular or switch to coffee (My preference would be more water.) Drink sugar free gatorade (zero or G2), diet lemonade or other drinks without sugar. Consider changing to 2% milk and to 1% as tolerated. Breakfast:  Eat only 1 packet of oatmeal  Balance breakfast would be:   1 packet of oatmeal   1 fruit   1 slice toast (optional)   Peanut butter or 1 slice cheese or egg    OR   1 English muffin   1 egg and cheese   Fruit Lunch:  1 protein (meat or tuna)  1 slice bread or 6 crackers  1 fruit  Fry less often.  Bake or boil or grill. Have a small portion of protein each meal and snack.   Expected Outcomes:  Demonstrated interest in learning. Expect positive outcomes  Education material provided: ADA Diabetes: Your Take Control Guide, Meal plan card, My Plate and Snack sheet, breakfast ideas  If problems or questions, patient to contact team via:  Phone  Future DSME  appointment: 4-6 wks

## 2017-09-07 DIAGNOSIS — F259 Schizoaffective disorder, unspecified: Secondary | ICD-10-CM | POA: Diagnosis not present

## 2017-09-07 DIAGNOSIS — F331 Major depressive disorder, recurrent, moderate: Secondary | ICD-10-CM | POA: Diagnosis not present

## 2017-09-07 DIAGNOSIS — F419 Anxiety disorder, unspecified: Secondary | ICD-10-CM | POA: Diagnosis not present

## 2017-09-27 ENCOUNTER — Encounter: Payer: Self-pay | Admitting: Dietician

## 2017-09-27 ENCOUNTER — Encounter: Payer: Medicare Other | Attending: Family Medicine | Admitting: Dietician

## 2017-09-27 DIAGNOSIS — Z794 Long term (current) use of insulin: Secondary | ICD-10-CM | POA: Diagnosis not present

## 2017-09-27 DIAGNOSIS — Z713 Dietary counseling and surveillance: Secondary | ICD-10-CM | POA: Insufficient documentation

## 2017-09-27 DIAGNOSIS — E1142 Type 2 diabetes mellitus with diabetic polyneuropathy: Secondary | ICD-10-CM | POA: Diagnosis not present

## 2017-09-27 DIAGNOSIS — E118 Type 2 diabetes mellitus with unspecified complications: Secondary | ICD-10-CM

## 2017-09-27 NOTE — Progress Notes (Signed)
Diabetes Self-Management Education  Visit Type:  Follow-up  Appt. Start Time: 1410 Appt. End Time: 6440  09/27/2017  [name], identified by name and date of birth, is a 72 y.o. female with a diagnosis of Diabetes: Type 2.  Other history includes polyneuropathy, hyperlipidemia, HTN, COPD, osteoporosis, smokes toe amputation, dental issues and few teeth, and schizoaffective disorder.  Medication includes Metformin, Glipizide, Lantus, and Novolog. She is taking 20 units of Lantus q HS and is to take 7 units of Novolog before breakfast, and 9 units before lunch and dinner.  She often skips the am dose of Metformin and Breakfast and Lunch dose of Novolog as she is not eating (although drinking sugar containing beverages).    Patient lives alone.  She has 2 children nearby who are helpful.  She moved here 12 years ago from [name], [place] and is retired from the [employer].  She relies on [employer] or the city bus for transportation.  Her son will take her shopping once a week. At her appointment today she speaks of financial stress due to cost of medication.  She also discusses money that she gives her son.  She is on a fixed income.  She continues to skip meals often.  She has started to change some of the [employer] Drink to sugar free.  She has not made other changes.  She smokes and is unwilling to change.  She drinks [employer] and is unwilling to change.  She does not like sugar free drinks.  She does not want to buy water out of a vending machine as that is "wasting money" yet buys a sugary drink.  Discussed the amount of sugar in these beverages.  Discussed the effects on her blood sugar.  Discussed changes that she is willing to make.  She stated that she will ask about the [facility] [place].  ASSESSMENT  Weight 209 lb (94.8 kg). Body mass index is 37.62 kg/m.   Diabetes Self-Management Education - 09/27/17 1608      Psychosocial Assessment   Self-care barriers  None    Self-management  support  Doctor's office;Family    Patient Concerns  Nutrition/Meal planning;Glycemic Control    Special Needs  Simplified materials    Preferred Learning Style  No preference indicated    Learning Readiness  Ready      Pre-Education Assessment   Patient understands the diabetes disease and treatment process.  Demonstrates understanding / competency    Patient understands incorporating nutritional management into lifestyle.  Needs Review    Patient undertands incorporating physical activity into lifestyle.  Needs Review    Patient understands using medications safely.  Needs Review    Patient understands monitoring blood glucose, interpreting and using results  Needs Review    Patient understands prevention, detection, and treatment of chronic complications.  Needs Review    Patient understands how to develop strategies to address psychosocial issues.  Needs Review    Patient understands how to develop strategies to promote health/change behavior.  Needs Review      Complications   How often do you check your blood sugar?  1-2 times/day    Fasting Blood glucose range (mg/dL)  >200   low 200's - 270   Number of hypoglycemic episodes per month  0      Dietary Intake   Breakfast  coffee with sweet and low, whole milk AND occasional oatmeal but mostly skips    Snack (morning)  none    Lunch  tuna in oil OR leftovers OR skips    Snack (afternoon)  none    Dinner  potatoe, hamburger patty    Snack (evening)  4-5 small candy bars    Beverage(s)  [employer] drinds (3 per day now mixing half and half with sugar free), water, whole milk, regular coke, regular lemonade      Exercise   Exercise Type  ADL's   scared of dogs     Patient Education   Previous Diabetes Education  Yes (please comment)   1 month ago   Nutrition management   Meal options for control of blood glucose level and chronic complications.;Meal timing in regards to the patients' current diabetes medication.;Role of  diet in the treatment of diabetes and the relationship between the three main macronutrients and blood glucose level    Physical activity and exercise   Role of exercise on diabetes management, blood pressure control and cardiac health.;Helped patient identify appropriate exercises in relation to his/her diabetes, diabetes complications and other health issue.    Medications  Reviewed patients medication for diabetes, action, purpose, timing of dose and side effects.    Psychosocial adjustment  Worked with patient to identify barriers to care and solutions    Personal strategies to promote health  Lifestyle issues that need to be addressed for better diabetes care      Individualized Goals (developed by patient)   Nutrition  General guidelines for healthy choices and portions discussed    Physical Activity  Exercise 3-5 times per week;30 minutes per day    Medications  take my medication as prescribed    Monitoring   test my blood glucose as discussed    Reducing Risk  increase portions of healthy fats;treat hypoglycemia with 15 grams of carbs if blood glucose less than 70mg /dL;Other (comment)   avoid skipping meals and take Novolog before    Health Coping  discuss diabetes with (comment)   MD, RD, CDE     Patient Self-Evaluation of Goals - Patient rates self as meeting previously set goals (% of time)   Nutrition  >75%    Physical Activity  >75%    Medications  >75%    Monitoring  >75%    Problem Solving  >75%    Reducing Risk  >75%    Health Coping  >75%      Post-Education Assessment   Patient understands the diabetes disease and treatment process.  Demonstrates understanding / competency    Patient understands incorporating nutritional management into lifestyle.  Needs Review    Patient undertands incorporating physical activity into lifestyle.  Needs Review    Patient understands using medications safely.  Needs Review    Patient understands monitoring blood glucose, interpreting  and using results  Demonstrates understanding / competency    Patient understands prevention, detection, and treatment of acute complications.  Demonstrates understanding / competency    Patient understands prevention, detection, and treatment of chronic complications.  Demonstrates understanding / competency    Patient understands how to develop strategies to address psychosocial issues.  Needs Review    Patient understands how to develop strategies to promote health/change behavior.  Needs Review      Outcomes   Program Status  Completed      Subsequent Visit   Since your last visit have you continued or begun to take your medications as prescribed?  No    Since your last visit have you experienced any weight changes?  Gain  Weight Gain (lbs)  7    Since your last visit, are you checking your blood glucose at least once a day?  Yes       Learning Objective:  Patient will have a greater understanding of diabetes self-management. Patient education plan is to attend individual and/or group sessions per assessed needs and concerns.   Plan:   Patient Instructions  -Continue to work on your beverage intake (choosing sugar free drinks or water).  Great job introducing some sugar free [employer] drink rather than regular.  Continue to work on this and work on [employer] to 2 per day (prefer sugar free or choose coffee instead)- I prefer more water rather than the Energy Drink. -Consider going back to the [employer] [facility] Work out transportation. -Rather than the 5 mini candy bars each night decrease this to 2 per night.  If you are hungry eat 1 ounce cheese and if desired 5 crackers.  -Try to avoid skipping meals.  Eat Breakfast, Lunch, and Dinner daily and take your Novolog before the meal.  Be sure to have carbohydrate such as fruit or bread or crackers or potatoes or oatmeal. -Eat more vegetables.  What is your choice?  Do you want to drink the sugary drink and have your  blood sugar rise or drink water with better blood sugar?  Remember that each can of regular Monster or soda has 1/4 cup sugar.    Expected Outcomes:  Demonstrated interest in learning. Expect positive outcomes  Education material provided:   If problems or questions, patient to contact team via:  Phone  Future DSME appointment: - 4-6 wks

## 2017-09-27 NOTE — Patient Instructions (Signed)
-  Continue to work on your beverage intake (choosing sugar free drinks or water).  Great job introducing some sugar free Monster drink rather than regular.  Continue to work on this and work on [employer] to 2 per day (prefer sugar free or choose coffee instead)- I prefer more water rather than the Energy Drink. -Consider going back to the [employer] ([employer]?) Work out transportation. -Rather than the 5 mini candy bars each night decrease this to 2 per night.  If you are hungry eat 1 ounce cheese and if desired 5 crackers.  -Try to avoid skipping meals.  Eat Breakfast, Lunch, and Dinner daily and take your Novolog before the meal.  Be sure to have carbohydrate such as fruit or bread or crackers or potatoes or oatmeal. -Eat more vegetables.  What is your choice?  Do you want to drink the sugary drink and have your blood sugar rise or drink water with better blood sugar?  Remember that each can of regular Monster or soda has 1/4 cup sugar.

## 2017-10-17 DIAGNOSIS — E782 Mixed hyperlipidemia: Secondary | ICD-10-CM | POA: Diagnosis not present

## 2017-10-17 DIAGNOSIS — Z6837 Body mass index (BMI) 37.0-37.9, adult: Secondary | ICD-10-CM | POA: Diagnosis not present

## 2017-10-17 DIAGNOSIS — Z780 Asymptomatic menopausal state: Secondary | ICD-10-CM | POA: Diagnosis not present

## 2017-10-17 DIAGNOSIS — E1142 Type 2 diabetes mellitus with diabetic polyneuropathy: Secondary | ICD-10-CM | POA: Diagnosis not present

## 2017-10-17 DIAGNOSIS — Z0000 Encounter for general adult medical examination without abnormal findings: Secondary | ICD-10-CM | POA: Diagnosis not present

## 2017-10-17 DIAGNOSIS — Z1239 Encounter for other screening for malignant neoplasm of breast: Secondary | ICD-10-CM | POA: Diagnosis not present

## 2017-10-17 DIAGNOSIS — Z23 Encounter for immunization: Secondary | ICD-10-CM | POA: Diagnosis not present

## 2017-10-17 DIAGNOSIS — F251 Schizoaffective disorder, depressive type: Secondary | ICD-10-CM | POA: Diagnosis not present

## 2017-10-17 DIAGNOSIS — Z794 Long term (current) use of insulin: Secondary | ICD-10-CM | POA: Diagnosis not present

## 2017-10-17 DIAGNOSIS — I10 Essential (primary) hypertension: Secondary | ICD-10-CM | POA: Diagnosis not present

## 2017-10-17 DIAGNOSIS — J449 Chronic obstructive pulmonary disease, unspecified: Secondary | ICD-10-CM | POA: Diagnosis not present

## 2017-10-20 ENCOUNTER — Other Ambulatory Visit: Payer: Self-pay | Admitting: Family Medicine

## 2017-10-20 DIAGNOSIS — R5381 Other malaise: Secondary | ICD-10-CM

## 2017-10-21 ENCOUNTER — Other Ambulatory Visit: Payer: Self-pay | Admitting: Family Medicine

## 2017-10-21 DIAGNOSIS — E2839 Other primary ovarian failure: Secondary | ICD-10-CM

## 2017-10-22 ENCOUNTER — Ambulatory Visit (HOSPITAL_COMMUNITY)
Admission: EM | Admit: 2017-10-22 | Discharge: 2017-10-22 | Disposition: A | Discharge status: Home / Self Care | Payer: Medicare Other | Attending: Family Medicine | Admitting: Family Medicine

## 2017-10-22 ENCOUNTER — Other Ambulatory Visit: Payer: Self-pay

## 2017-10-22 ENCOUNTER — Ambulatory Visit (INDEPENDENT_AMBULATORY_CARE_PROVIDER_SITE_OTHER): Payer: Medicare Other

## 2017-10-22 ENCOUNTER — Encounter (HOSPITAL_COMMUNITY): Payer: Self-pay

## 2017-10-22 DIAGNOSIS — M7031 Other bursitis of elbow, right elbow: Secondary | ICD-10-CM

## 2017-10-22 DIAGNOSIS — M7021 Olecranon bursitis, right elbow: Secondary | ICD-10-CM

## 2017-10-22 DIAGNOSIS — L03113 Cellulitis of right upper limb: Secondary | ICD-10-CM | POA: Diagnosis not present

## 2017-10-22 DIAGNOSIS — R600 Localized edema: Secondary | ICD-10-CM | POA: Diagnosis not present

## 2017-10-22 MED ORDER — KETOROLAC TROMETHAMINE 30 MG/ML IJ SOLN
INTRAMUSCULAR | Status: AC
  Filled 2017-10-22: qty 1

## 2017-10-22 MED ORDER — DOXYCYCLINE HYCLATE 100 MG PO CAPS: 100.0000 mg | ORAL_CAPSULE | Freq: Two times a day (BID) | ORAL | 0 refills | Status: AC

## 2017-10-22 MED ORDER — KETOROLAC TROMETHAMINE 30 MG/ML IJ SOLN
30.0000 mg | Freq: Once | INTRAMUSCULAR | Status: AC
  Administered 2017-10-22: 30 mg via INTRAMUSCULAR

## 2017-10-22 MED ORDER — DOXYCYCLINE HYCLATE 100 MG PO CAPS: 100.0000 mg | ORAL_CAPSULE | Freq: Two times a day (BID) | ORAL | 0 refills | Status: DC

## 2017-10-22 MED ORDER — IBUPROFEN 400 MG PO TABS: 400.0000 mg | ORAL_TABLET | Freq: Four times a day (QID) | ORAL | 0 refills | Status: AC | PRN

## 2017-10-22 NOTE — ED Provider Notes (Signed)
[facility]   [id number] 10/22/17 Arrival Time: 5462  CC:  Right elbow pain  SUBJECTIVE:  [name] is a 72 y.o. female hx significant for DM, schizophrenia, HTN, COPD, HLD, who presents with a right elbow swelling and redness x 3-4 days.  Denies a specific injury or trauma.  Describes it as painful, red, spreading, and swollen.  Has tried creams without relief.  Symptoms are made worse with putting pressure on it.  Reports previous hx of skin infections in the past and had toe amputation.  Denies fever, chills, nausea, vomiting, SOB, chest pain, abdominal pain, changes in bowel or bladder function.    ROS: As per HPI.  Past Medical History:  Diagnosis Date  . Anxiety   . Arthritis   . Cataract   . COPD (chronic obstructive pulmonary disease) ([place])   . Depression   . Diabetes mellitus without complication ([place])   . Glaucoma   . Headache    "migraines im my 20's"  . Hyperlipidemia   . Hypertension   . Neuromuscular disorder ([place])   . Osteomyelitis ([place])    right second toe  . Osteoporosis   . Schizophrenia ([place])    schizoaffective  . Substance abuse [facility])    Past Surgical History:  Procedure Laterality Date  . AMPUTATION Right 10/29/2016   Procedure: Right Foot 2nd Toe Amputation at Metatarsophalangeal Joint;  Surgeon: Newt Minion, MD;  Location: [facility];  Service: Orthopedics;  Laterality: Right;  . EYE SURGERY    . KNEE SURGERY    . MULTIPLE TOOTH EXTRACTIONS    . VASCULAR SURGERY     vein surgery   Allergies  Allergen Reactions  . Sitagliptin Phosphate Nausea And Vomiting    JANUVIA    No current facility-administered medications on file prior to encounter.    Current Outpatient Medications on File Prior to Encounter  Medication Sig Dispense Refill  . amLODipine (NORVASC) 10 MG tablet Take 10 mg by mouth daily.    Marland Kitchen aspirin EC 81 MG tablet Take 81 mg by mouth daily.    Marland Kitchen atorvastatin (LIPITOR) 20 MG tablet Take 20 mg by mouth daily.    . BD  PEN NEEDLE NANO U/F 32G X 4 MM MISC     . bimatoprost (LUMIGAN) 0.03 % ophthalmic solution Place 1 drop into both eyes at bedtime.    . chlorproMAZINE (THORAZINE) 100 MG tablet Take 100 mg by mouth at bedtime.     Marland Kitchen glipiZIDE (GLUCOTROL) 5 MG tablet Take by mouth daily before breakfast.    . hydrOXYzine (ATARAX/VISTARIL) 10 MG tablet Take 10 mg by mouth 2 (two) times daily.     Marland Kitchen ibuprofen (ADVIL,MOTRIN) 200 MG tablet Take 400 mg by mouth every 6 (six) hours as needed for moderate pain.    Marland Kitchen insulin glargine (LANTUS) 100 UNIT/ML injection Inject 20 Units into the skin at bedtime.     . metFORMIN (GLUCOPHAGE) 1000 MG tablet Take 750 mg by mouth 2 (two) times daily with a meal.    . NOVOLOG FLEXPEN 100 UNIT/ML FlexPen Inject 7-9 Units into the skin 3 (three) times daily with meals. Take 7 units in the am, 9 units at lunch and 9 units at dinner.    . pregabalin (LYRICA) 100 MG capsule Take 100 mg by mouth 3 (three) times daily.    . sertraline (ZOLOFT) 100 MG tablet Take 100 mg by mouth daily.    . silver sulfADIAZINE (SILVADENE) 1 % cream Apply 1  application topically daily as needed.    . topiramate (TOPAMAX) 50 MG tablet Take 50 mg by mouth daily.     . valsartan (DIOVAN) 160 MG tablet Take 160 mg by mouth daily.    . vitamin C (ASCORBIC ACID) 500 MG tablet Take 500 mg by mouth daily as needed (cold prevention).     Social History   Socioeconomic History  . Marital status: Single    Spouse name: Not on file  . Number of children: Not on file  . Years of education: Not on file  . Highest education level: Not on file  Occupational History  . Not on file  Social Needs  . Financial resource strain: Not on file  . Food insecurity:    Worry: Not on file    Inability: Not on file  . Transportation needs:    Medical: Not on file    Non-medical: Not on file  Tobacco Use  . Smoking status: Current Every Day Smoker    Packs/day: 2.00    Years: 53.00    Pack years: 106.00    Types:  Cigarettes  . Smokeless tobacco: Never Used  Substance and Sexual Activity  . Alcohol use: No    Alcohol/week: 0.0 standard drinks  . Drug use: No  . Sexual activity: Not on file  Lifestyle  . Physical activity:    Days per week: Not on file    Minutes per session: Not on file  . Stress: Not on file  Relationships  . Social connections:    Talks on phone: Not on file    Gets together: Not on file    Attends religious service: Not on file    Active member of club or organization: Not on file    Attends meetings of clubs or organizations: Not on file    Relationship status: Not on file  . Intimate partner violence:    Fear of current or ex partner: Not on file    Emotionally abused: Not on file    Physically abused: Not on file    Forced sexual activity: Not on file  Other Topics Concern  . Not on file  Social History Narrative  . Not on file   Family History  Problem Relation Age of Onset  . Heart disease Father     OBJECTIVE: Vitals:   10/22/17 1602 10/22/17 1604  BP:  108/68  Pulse:  69  Resp:  20  Temp:  97.8 F (36.6 C)  TempSrc:  Oral  SpO2:  100%  Weight: 210 lb (95.3 kg)     General appearance: alert; no distress; appears uncomfortable Lungs: clear to auscultation bilaterally Heart: regular rate and rhythm.  Extremities: no edema Skin: warm and dry; swelling and erythema localized to posterior aspect of right elbow; approximately 5 x 5 cm area; tender to palpation over the olecranon process; no obvious drainage; FROM about the elbow; radial pulse intact 2+; strength ans sensation intact about the elbow Psychological: alert and cooperative; normal mood and affect      DIAGNOSTIC STUDIES:  Dg Elbow Complete Right  Result Date: 10/22/2017 CLINICAL DATA:  Swelling and redness. EXAM: RIGHT ELBOW - COMPLETE 3+ VIEW COMPARISON:  None. FINDINGS: Soft tissue edema. Swelling over the olecranon. No fractures noted. No joint effusion. IMPRESSION: 1. No fracture  or joint effusion. 2. Soft tissue edema. More focal soft tissue thickening over the olecranon suggest bursitis. Recommend clinical correlation. Electronically Signed   By: Dorise Bullion III M.D  On: 10/22/2017 17:11   ASSESSMENT & PLAN:  1. Cellulitis of right upper extremity   2. Bicipitoradial bursitis of right elbow     Meds ordered this encounter  Medications  . doxycycline (VIBRAMYCIN) 100 MG capsule    Sig: Take 1 capsule (100 mg total) by mouth 2 (two) times daily.    Dispense:  20 capsule    Refill:  0    Order Specific Question:   Supervising Provider    Answer:   Wynona Luna [[id number]]   Shot given in office X-rays did not show signs of fracture or dislocation Prescribed doxycycline take as directed and to completion Requests ibuprofen 800 mg.  Instructed to not take with other NSAIDs and that this medication may interact with her zoloft.   Follow up with PCP if symptoms persists Return or go to the ED if you have any new or worsening symptoms such as increased pain, redness, swelling, discharge, high fever, night sweats, abdominal pain, etc...  Reviewed expectations re: course of current medical issues. Questions answered. Outlined signs and symptoms indicating need for more acute intervention. Patient verbalized understanding. After Visit Summary given.   Lestine Box, PA-C 10/22/17 1831

## 2017-10-22 NOTE — ED Triage Notes (Signed)
Pt has a wound that needs to be checked. Right arm red swollen x 1 week

## 2017-10-22 NOTE — Discharge Instructions (Addendum)
X-rays did not show signs of fracture or dislocation Prescribed doxycycline take as directed and to completion Continue to alternate ibuprofen and tylenol as needed for pain and fever Follow up with PCP if symptoms persists Return or go to the ED if you have any new or worsening symptoms such as increased pain, redness, swelling, discharge, high fever, night sweats, abdominal pain, etc..Marland Kitchen

## 2017-10-27 ENCOUNTER — Ambulatory Visit (INDEPENDENT_AMBULATORY_CARE_PROVIDER_SITE_OTHER): Payer: Medicare Other | Admitting: Orthopedic Surgery

## 2017-11-01 ENCOUNTER — Ambulatory Visit: Payer: Medicare Other | Admitting: Dietician

## 2017-11-14 ENCOUNTER — Encounter: Payer: Self-pay | Admitting: Dietician

## 2017-11-14 ENCOUNTER — Encounter: Payer: Medicare Other | Attending: Family Medicine | Admitting: Dietician

## 2017-11-14 DIAGNOSIS — E1142 Type 2 diabetes mellitus with diabetic polyneuropathy: Secondary | ICD-10-CM | POA: Insufficient documentation

## 2017-11-14 DIAGNOSIS — E118 Type 2 diabetes mellitus with unspecified complications: Secondary | ICD-10-CM

## 2017-11-14 DIAGNOSIS — Z794 Long term (current) use of insulin: Secondary | ICD-10-CM | POA: Diagnosis not present

## 2017-11-14 DIAGNOSIS — Z713 Dietary counseling and surveillance: Secondary | ICD-10-CM | POA: Insufficient documentation

## 2017-11-14 NOTE — Patient Instructions (Addendum)
Start checking your blood sugar again. Resume your long acting insulin when you get this. Continue taking other medication as prescribed. Resume solid foods as tolerated  White rice and tuna or baked skinless chicken  Banana or applesauce  White toast or crackers  Baked potato without skin, baked chicken breast  Plain yogurt with fruit Gradually increase your diet back to your usual diabetic diet. Consider asking your insurance about Silver Sneakers.  If it is you can go to the [facility]. Avoid skipping meals  1/2 the plate non starchy vegetables  Small amount of protein (deck of cards size)  1/4 of your plate starch (potato, rice, bread, beans, cereal, etc)

## 2017-11-15 ENCOUNTER — Telehealth: Payer: Self-pay | Admitting: Dietician

## 2017-11-15 NOTE — Telephone Encounter (Signed)
Patient returned the call.  She has tolerated solid meals.   Patient to call for any questions.  [name], RD, LDN, CDE

## 2017-11-15 NOTE — Telephone Encounter (Signed)
Called to see how she was tolerating solid food.  Patient was not available.  Patient to call for any questions.  Antonieta Iba, RD, LDN, CDE

## 2017-11-15 NOTE — Progress Notes (Signed)
Diabetes Self-Management Education  Visit Type:  Follow-up  Appt. Start Time: 1415 Appt. End Time: 6967 Patient arrived at wrong office.  11/15/2017  [name], identified by name and date of birth, is a 72 y.o. female with a diagnosis of Diabetes: Type 2  .  Other history includes polyneuropathy, hyperlipidemia, HTN, COPD, osteoporosis, smokes toe amputation, dental issues and few teeth, and schizoaffective disorder.  Medication includes Metformin, Glipizide, Lantus, and Novolog. She is taking 7 units of Novolog before breakfast, and 9 units before lunch and dinner.  She states that Lantus is no longer covered by her insurance, she has run out of this and is waiting on a mail order prescription of Basaglar.  She states that the Metformin was reintroduced 3-4 months ago and she has tolerated well.  Patient lives alone.  She has 2 children nearby who are helpful.  She moved here 12 years ago from [facility] and is retired from the [employer].  She relies on [employer] or the city bus for transportation.  Her son will take her shopping once a week.   Today she states that she was very sick with a stomach virus with increased nausea and diarrhea and has not yet resumed solid food for fear of a return of the symptoms.  She has also decreased the intake of the Monster zero to 2 per day.  She is drinking OJ, water, broth, chicken noodle soup, and tea with sweet and low. She states that she took an over the counter cold and flu medication to come to the appointment.  (She thought this would help prevent diarrhea.)  She was in the ER 10/22/17 for Cellulitis of the right arm and was started on doxycycline.  She reiterated that the diarrhea started prior to starting the antibiotic.    Encouraged her to resume solids with some more easily tolerated foods and to increase as tolerated to a normal diabetic diet.  Encouraged her to resume the habit of checking her blood sugar and to avoid skipping  meals.    Today, I called to see how she tolerated solids.  She was not available.  Message left and patient to call for questions.  Instructed her to call her MD if she did not tolerate solids.  ASSESSMENT  Diabetes Self-Management Education - 11/15/17 1000      Psychosocial Assessment   Self-care barriers  Debilitated state due to current medical condition    Patient Concerns  Nutrition/Meal planning;Glycemic Control    Special Needs  Simplified materials    Preferred Learning Style  No preference indicated    Learning Readiness  Not Ready      Pre-Education Assessment   Patient understands the diabetes disease and treatment process.  Demonstrates understanding / competency    Patient understands incorporating nutritional management into lifestyle.  Needs Review    Patient undertands incorporating physical activity into lifestyle.  Demonstrates understanding / competency    Patient understands using medications safely.  Needs Review    Patient understands monitoring blood glucose, interpreting and using results  Demonstrates understanding / competency    Patient understands prevention, detection, and treatment of acute complications.  Needs Review    Patient understands prevention, detection, and treatment of chronic complications.  Demonstrates understanding / competency    Patient understands how to develop strategies to address psychosocial issues.  Needs Review    Patient understands how to develop strategies to promote health/change behavior.  Needs Review  Complications   How often do you check your blood sugar?  3-4 times / week    Fasting Blood glucose range (mg/dL)  180-200      Patient Education   Previous Diabetes Education  Yes (please comment)   1 month ago   Nutrition management   Meal options for control of blood glucose level and chronic complications.    Psychosocial adjustment  Worked with patient to identify barriers to care and solutions    Personal  strategies to promote health  Lifestyle issues that need to be addressed for better diabetes care      Individualized Goals (developed by patient)   Nutrition  General guidelines for healthy choices and portions discussed    Physical Activity  Exercise 3-5 times per week;30 minutes per day    Medications  take my medication as prescribed    Monitoring   test my blood glucose as discussed    Reducing Risk  Other (comment)   resume solid foods   Health Coping  discuss diabetes with (comment)   MD, RD, CDE     Patient Self-Evaluation of Goals - Patient rates self as meeting previously set goals (% of time)   Nutrition  25 - 50%    Physical Activity  50 - 75 %    Medications  >75%    Monitoring  50 - 75 %    Problem Solving  50 - 75 %    Reducing Risk  50 - 75 %    Health Coping  >75%      Post-Education Assessment   Patient understands the diabetes disease and treatment process.  Demonstrates understanding / competency    Patient understands incorporating nutritional management into lifestyle.  Needs Review    Patient undertands incorporating physical activity into lifestyle.  Demonstrates understanding / competency    Patient understands using medications safely.  Needs Review    Patient understands monitoring blood glucose, interpreting and using results  Needs Review    Patient understands prevention, detection, and treatment of acute complications.  Demonstrates understanding / competency    Patient understands prevention, detection, and treatment of chronic complications.  Demonstrates understanding / competency    Patient understands how to develop strategies to address psychosocial issues.  Needs Review    Patient understands how to develop strategies to promote health/change behavior.  Needs Review      Outcomes   Program Status  Completed     Learning Objective:  Patient will have a greater understanding of diabetes self-management. Patient education plan is to attend  individual and/or group sessions per assessed needs and concerns.   Plan:   Patient Instructions  Start checking your blood sugar again. Resume your long acting insulin when you get this. Continue taking other medication as prescribed. Resume solid foods as tolerated  White rice and tuna or baked skinless chicken  Banana or applesauce  White toast or crackers  Baked potato without skin, baked chicken breast  Plain yogurt with fruit Gradually increase your diet back to your usual diabetic diet. Consider asking your insurance about Silver Sneakers.  If it is you can go to the [facility]. Avoid skipping meals  1/2 the plate non starchy vegetables  Small amount of protein (deck of cards size)  1/4 of your plate starch (potato, rice, bread, beans, cereal, etc)      Expected Outcomes:  Demonstrated interest in learning. Expect positive outcomes  Education material provided:   If problems or questions, patient  to contact team via:  Phone  Future DSME appointment: - 4-6 wks

## 2017-11-21 ENCOUNTER — Ambulatory Visit
Admission: RE | Admit: 2017-11-21 | Discharge: 2017-11-21 | Disposition: A | Discharge status: Home / Self Care | Payer: Medicare Other | Source: Ambulatory Visit | Attending: Family Medicine | Admitting: Family Medicine

## 2017-11-21 DIAGNOSIS — Z1231 Encounter for screening mammogram for malignant neoplasm of breast: Secondary | ICD-10-CM | POA: Diagnosis not present

## 2017-12-01 DIAGNOSIS — F419 Anxiety disorder, unspecified: Secondary | ICD-10-CM | POA: Diagnosis not present

## 2017-12-01 DIAGNOSIS — F331 Major depressive disorder, recurrent, moderate: Secondary | ICD-10-CM | POA: Diagnosis not present

## 2017-12-01 DIAGNOSIS — F259 Schizoaffective disorder, unspecified: Secondary | ICD-10-CM | POA: Diagnosis not present

## 2017-12-07 DIAGNOSIS — H401321 Pigmentary glaucoma, left eye, mild stage: Secondary | ICD-10-CM | POA: Diagnosis not present

## 2017-12-15 ENCOUNTER — Ambulatory Visit
Admission: RE | Admit: 2017-12-15 | Discharge: 2017-12-15 | Disposition: A | Discharge status: Home / Self Care | Payer: Medicare Other | Source: Ambulatory Visit | Attending: Family Medicine | Admitting: Family Medicine

## 2017-12-15 DIAGNOSIS — Z780 Asymptomatic menopausal state: Secondary | ICD-10-CM | POA: Diagnosis not present

## 2017-12-15 DIAGNOSIS — E2839 Other primary ovarian failure: Secondary | ICD-10-CM

## 2017-12-15 DIAGNOSIS — M8589 Other specified disorders of bone density and structure, multiple sites: Secondary | ICD-10-CM | POA: Diagnosis not present

## 2017-12-16 ENCOUNTER — Encounter: Payer: Medicare Other | Attending: Family Medicine | Admitting: Dietician

## 2017-12-16 DIAGNOSIS — E118 Type 2 diabetes mellitus with unspecified complications: Secondary | ICD-10-CM

## 2017-12-16 DIAGNOSIS — IMO0002 Reserved for concepts with insufficient information to code with codable children: Secondary | ICD-10-CM

## 2017-12-16 DIAGNOSIS — Z713 Dietary counseling and surveillance: Secondary | ICD-10-CM | POA: Insufficient documentation

## 2017-12-16 DIAGNOSIS — E1165 Type 2 diabetes mellitus with hyperglycemia: Secondary | ICD-10-CM

## 2017-12-16 DIAGNOSIS — Z794 Long term (current) use of insulin: Secondary | ICD-10-CM | POA: Diagnosis not present

## 2017-12-16 DIAGNOSIS — E1142 Type 2 diabetes mellitus with diabetic polyneuropathy: Secondary | ICD-10-CM | POA: Diagnosis not present

## 2017-12-16 NOTE — Progress Notes (Signed)
Diabetes Self-Management Education  Visit Type: Follow-up  Appt. Start Time: 1515 Appt. End Time: 7124  12/16/2017  [name], identified by name and date of birth, is a 72 y.o. female with a diagnosis of Diabetes:  .Type 2 Diabetes   ASSESSMENT Other history includes polyneuropathy, hyperlipidemia, HTN, COPD, osteoporosis, smokes toe amputation, dental issues and few teeth, and schizoaffective disorder.  Medication includes Metformin, Glipizide, Basaglar 36 units q HS, and Novolog. She is taking 7 units of Novolog before breakfast, and 9 units before lunch and dinner. She states that Lantus is no longer covered by her insurance.  She states that the Metformin was reintroduced 3-4 months ago and she has tolerated well.  Patient lives alone. She has 2 children nearby who are helpful. She moved here 12 years ago from [facility] and is retired from the [employer]. She relies on [employer] or the city bus for transportation. Her son will take her shopping once a week.   Weight 211 lb (95.7 kg). Body mass index is 37.98 kg/m.  Diabetes Self-Management Education - 12/16/17 1800      Visit Information   Visit Type  Follow-up      Initial Visit   Are you taking your medications as prescribed?  Yes      Psychosocial Assessment   Self-care barriers  Debilitated state due to current medical condition    Other persons present  Patient    Patient Concerns  Nutrition/Meal planning;Glycemic Control;Weight Control    Special Needs  Simplified materials    Preferred Learning Style  No preference indicated    Learning Readiness  Contemplating    How often do you need to have someone help you when you read instructions, pamphlets, or other written materials from your doctor or pharmacy?  1 - Never    What is the last grade level you completed in school?  12th grade      Pre-Education Assessment   Patient understands the diabetes disease and treatment process.  Demonstrates  understanding / competency    Patient understands incorporating nutritional management into lifestyle.  Needs Review    Patient undertands incorporating physical activity into lifestyle.  Demonstrates understanding / competency    Patient understands using medications safely.  Demonstrates understanding / competency    Patient understands monitoring blood glucose, interpreting and using results  Demonstrates understanding / competency    Patient understands prevention, detection, and treatment of acute complications.  Demonstrates understanding / competency    Patient understands prevention, detection, and treatment of chronic complications.  Demonstrates understanding / competency    Patient understands how to develop strategies to address psychosocial issues.  Needs Review    Patient understands how to develop strategies to promote health/change behavior.  Needs Review      Complications   How often do you check your blood sugar?  1-2 times/day    Fasting Blood glucose range (mg/dL)  130-179;180-200   160-200 fasting   Postprandial Blood glucose range (mg/dL)  >200   275 before bed at times     Dietary Intake   Breakfast  2 instant oatmeal, berries OR scrambled eggs with 1 T butter and bagel with cream cheese or English muffin with butter    Snack (morning)  none    Lunch  skips or Bagel OR leftovers    Snack (afternoon)  none    Dinner  hamburger helper or Homemade soup with bagel, english muffin or roll    Snack (evening)  cheese doodles or 3-4 mini chocolate candy bars    Beverage(s)  coffee with sweet and low and whole milk, Regular Monster [employer] Drink, more water, whole milk      Exercise   Exercise Type  Light (walking / raking leaves)   relies on the bus for transportation     Patient Education   Previous Diabetes Education  Yes (please comment)   09/2017   Nutrition management   Role of diet in the treatment of diabetes and the relationship between the three main  macronutrients and blood glucose level;Meal timing in regards to the patients' current diabetes medication.;Meal options for control of blood glucose level and chronic complications.    Physical activity and exercise   Role of exercise on diabetes management, blood pressure control and cardiac health.    Psychosocial adjustment  Worked with patient to identify barriers to care and solutions    Personal strategies to promote health  Lifestyle issues that need to be addressed for better diabetes care      Individualized Goals (developed by patient)   Nutrition  General guidelines for healthy choices and portions discussed    Physical Activity  Exercise 3-5 times per week;30 minutes per day    Medications  take my medication as prescribed    Monitoring   test my blood glucose as discussed    Problem Solving  decreasing fat and sugar drinks      Patient Self-Evaluation of Goals - Patient rates self as meeting previously set goals (% of time)   Nutrition  25 - 50%    Physical Activity  50 - 75 %    Medications  >75%    Monitoring  50 - 75 %    Problem Solving  50 - 75 %    Reducing Risk  50 - 75 %    Health Coping  >75%      Post-Education Assessment   Patient understands the diabetes disease and treatment process.  Demonstrates understanding / competency    Patient understands incorporating nutritional management into lifestyle.  Needs Review    Patient undertands incorporating physical activity into lifestyle.  Demonstrates understanding / competency    Patient understands using medications safely.  Needs Review    Patient understands monitoring blood glucose, interpreting and using results  Needs Review    Patient understands prevention, detection, and treatment of acute complications.  Demonstrates understanding / competency    Patient understands prevention, detection, and treatment of chronic complications.  Demonstrates understanding / competency    Patient understands how to develop  strategies to address psychosocial issues.  Needs Review    Patient understands how to develop strategies to promote health/change behavior.  Needs Review      Outcomes   Expected Outcomes  Demonstrated limited interest in learning.  Expect minimal changes    Future DMSE  4-6 wks    Program Status  Completed       Individualized Plan for Diabetes Self-Management Training:   Learning Objective:  Patient will have a greater understanding of diabetes self-management. Patient education plan is to attend individual and/or group sessions per assessed needs and concerns.   Plan:   Patient Instructions  Avoid the Monster Drink or choose diet. Avoid snacking if you are not hungry. Eat slowly and stop when you are satisfied. Use less butter (consider decreasing butter to 1 pat).    Call your insurance to see if you qualify for Silver Sneakers.     Expected  Outcomes:  Demonstrated limited interest in learning.  Expect minimal changes  Education material provided:   If problems or questions, patient to contact team via:  Phone  Future DSME appointment: 4-6 wks

## 2017-12-16 NOTE — Patient Instructions (Addendum)
Avoid the Monster Drink or choose diet. Avoid snacking if you are not hungry. Eat slowly and stop when you are satisfied. Use less butter (consider decreasing butter to 1 pat).    Call your insurance to see if you qualify for Silver Sneakers.

## 2017-12-29 DIAGNOSIS — H26493 Other secondary cataract, bilateral: Secondary | ICD-10-CM | POA: Diagnosis not present

## 2018-01-03 ENCOUNTER — Encounter: Payer: Self-pay | Admitting: Dietician

## 2018-01-03 ENCOUNTER — Encounter: Payer: Medicare Other | Attending: Family Medicine | Admitting: Dietician

## 2018-01-03 DIAGNOSIS — Z794 Long term (current) use of insulin: Secondary | ICD-10-CM | POA: Insufficient documentation

## 2018-01-03 DIAGNOSIS — E118 Type 2 diabetes mellitus with unspecified complications: Secondary | ICD-10-CM

## 2018-01-03 DIAGNOSIS — E1142 Type 2 diabetes mellitus with diabetic polyneuropathy: Secondary | ICD-10-CM | POA: Insufficient documentation

## 2018-01-03 DIAGNOSIS — Z713 Dietary counseling and surveillance: Secondary | ICD-10-CM | POA: Insufficient documentation

## 2018-01-03 NOTE — Progress Notes (Signed)
Diabetes Self-Management Education  Visit Type:  Follow-up  Appt. Start Time: 1600 Appt. End Time: 1630  01/03/2018  [name], identified by name and date of birth, is a 72 y.o. female with a diagnosis of Diabetes:   Type 2.  ASSESSMENT Other history includespolyneuropathy, hyperlipidemia, HTN, COPD, osteoporosis, smokes toe amputation, dental issues and few teeth, and schizoaffective disorder.  Medication includes Metformin, Glipizide, Basaglar 36 units q HS, and Novolog. She is taking 7 units of Novolog before breakfast, and 9 units before lunch and dinner.She states that Lantus is no longer covered by her insurance. She states that the Metformin was reintroduced 3-4 months ago and she has tolerated well.  Patient lives alone. She has 2 children nearby who are helpful. She moved here 12 years ago from [facility] and is retired from the [employer]. She relies on [employer] or the city bus for transportation. Her son will take her shopping once a week  Patient is here today alone.  She rescheduled her appointment due to conflict.  She was seen by me 2 weeks ago. She states that she has been stressed from recent eye surgery and has not eaten as much.  She states that she has decreased her intake of Monster but still consumes around 2 daily.  Discussed how much sugar is in it and showed her as well.   Also reviewed insulin injection sites and site rotation.  She has only been giving insulin in her arms.  Her diet remains high in fat.  Weight has decreased from 211 lbs to 204 lbs today.    Weight 204 lb (92.5 kg). Body mass index is 36.72 kg/m.   Diabetes Self-Management Education - 01/03/18 1600      Psychosocial Assessment   Self-care barriers  Debilitated state due to current medical condition    Self-management support  Doctor's office;Family;CDE visits    Patient Concerns  Nutrition/Meal planning;Glycemic Control;Weight Control    Special Needs  Simplified materials     Preferred Learning Style  No preference indicated    Learning Readiness  Contemplating      Pre-Education Assessment   Patient understands the diabetes disease and treatment process.  Demonstrates understanding / competency    Patient understands incorporating nutritional management into lifestyle.  Needs Review    Patient undertands incorporating physical activity into lifestyle.  Demonstrates understanding / competency    Patient understands using medications safely.  Needs Review    Patient understands monitoring blood glucose, interpreting and using results  Needs Review    Patient understands prevention, detection, and treatment of acute complications.  Demonstrates understanding / competency    Patient understands prevention, detection, and treatment of chronic complications.  Demonstrates understanding / competency    Patient understands how to develop strategies to address psychosocial issues.  Needs Review    Patient understands how to develop strategies to promote health/change behavior.  Needs Review      Complications   How often do you check your blood sugar?  0 times/day (not testing)      Dietary Intake   Breakfast  2 packs instant oatmeal, berries, milk, coffee, 1 monster drink    Snack (morning)  none    Lunch  skips often    Snack (afternoon)  none    Dinner  chicken, potato, sweet potato soup    Snack (evening)  candy or cheese doodles    Beverage(s)  coffee with sweet and low and whole milk, monster energy drinks up  to 3 daily- regular, water, whole milk      Exercise   Exercise Type  Light (walking / raking leaves)   takes the bus     Patient Education   Previous Diabetes Education  Yes (please comment)   2 weeks ago   Nutrition management   Meal options for control of blood glucose level and chronic complications.    Medications  Taught/reviewed insulin injection, site rotation, insulin storage and needle disposal.    Psychosocial adjustment  Worked with  patient to identify barriers to care and solutions    Personal strategies to promote health  Lifestyle issues that need to be addressed for better diabetes care      Individualized Goals (developed by patient)   Nutrition  General guidelines for healthy choices and portions discussed    Physical Activity  Exercise 3-5 times per week;30 minutes per day    Medications  take my medication as prescribed    Monitoring   test my blood glucose as discussed    Problem Solving  ecrease fat and sugary drinks    Reducing Risk  examine blood glucose patterns;increase portions of healthy fats    Health Coping  discuss diabetes with (comment)   CDE     Patient Self-Evaluation of Goals - Patient rates self as meeting previously set goals (% of time)   Nutrition  25 - 50%    Physical Activity  50 - 75 %    Medications  >75%    Monitoring  < 25%    Problem Solving  25 - 50%    Reducing Risk  25 - 50%    Health Coping  25 - 50%      Post-Education Assessment   Patient understands the diabetes disease and treatment process.  Demonstrates understanding / competency    Patient understands incorporating nutritional management into lifestyle.  Needs Review    Patient undertands incorporating physical activity into lifestyle.  Demonstrates understanding / competency    Patient understands using medications safely.  Demonstrates understanding / competency    Patient understands monitoring blood glucose, interpreting and using results  Demonstrates understanding / competency    Patient understands prevention, detection, and treatment of acute complications.  Demonstrates understanding / competency    Patient understands prevention, detection, and treatment of chronic complications.  Demonstrates understanding / competency    Patient understands how to develop strategies to address psychosocial issues.  Needs Review    Patient understands how to develop strategies to promote health/change behavior.  Needs Review       Outcomes   Program Status  Completed      Subsequent Visit   Since your last visit have you continued or begun to take your medications as prescribed?  Yes    Since your last visit have you experienced any weight changes?  Loss    Weight Loss (lbs)  7    Since your last visit, are you checking your blood glucose at least once a day?  No       Learning Objective:  Patient will have a greater understanding of diabetes self-management. Patient education plan is to attend individual and/or group sessions per assessed needs and concerns.   Plan:   Patient Instructions  Start checking your blood sugar again each morning and 2 hours after you start dinner.  Remember to rotate your insulin injection sites.    Try to avoid the Monster drink.  This has about 3 tablespoons of sugar and  can make your blood sugar go up. Decrease the amount of butter that you use.  When you are upset do crafts rather than eat a snack.      Expected Outcomes:  Other (comment)(demonstrates interest in learning but challenged with change)  Education material provided:   If problems or questions, patient to contact team via:  Phone  Future DSME appointment: - 4-6 wks

## 2018-01-03 NOTE — Patient Instructions (Addendum)
Start checking your blood sugar again each morning and 2 hours after you start dinner.  Remember to rotate your insulin injection sites.    Try to avoid the Monster drink.  This has about 3 tablespoons of sugar and can make your blood sugar go up. Decrease the amount of butter that you use.  When you are upset do crafts rather than eat a snack.    Do you qualify for Silver Sneakers?

## 2018-01-13 ENCOUNTER — Ambulatory Visit: Payer: Medicare Other | Admitting: Dietician

## 2018-01-13 DIAGNOSIS — F17210 Nicotine dependence, cigarettes, uncomplicated: Secondary | ICD-10-CM | POA: Diagnosis not present

## 2018-01-13 DIAGNOSIS — J449 Chronic obstructive pulmonary disease, unspecified: Secondary | ICD-10-CM | POA: Diagnosis not present

## 2018-01-13 DIAGNOSIS — E1141 Type 2 diabetes mellitus with diabetic mononeuropathy: Secondary | ICD-10-CM | POA: Diagnosis not present

## 2018-01-13 DIAGNOSIS — E782 Mixed hyperlipidemia: Secondary | ICD-10-CM | POA: Diagnosis not present

## 2018-01-13 DIAGNOSIS — R062 Wheezing: Secondary | ICD-10-CM | POA: Diagnosis not present

## 2018-01-13 DIAGNOSIS — F17200 Nicotine dependence, unspecified, uncomplicated: Secondary | ICD-10-CM | POA: Diagnosis not present

## 2018-02-02 DIAGNOSIS — J449 Chronic obstructive pulmonary disease, unspecified: Secondary | ICD-10-CM | POA: Diagnosis not present

## 2018-02-02 DIAGNOSIS — Z9981 Dependence on supplemental oxygen: Secondary | ICD-10-CM | POA: Diagnosis not present

## 2018-02-13 ENCOUNTER — Encounter: Payer: Self-pay | Admitting: Dietician

## 2018-02-13 ENCOUNTER — Encounter: Payer: Medicare Other | Attending: Family Medicine | Admitting: Dietician

## 2018-02-13 DIAGNOSIS — Z794 Long term (current) use of insulin: Secondary | ICD-10-CM | POA: Insufficient documentation

## 2018-02-13 DIAGNOSIS — Z713 Dietary counseling and surveillance: Secondary | ICD-10-CM | POA: Insufficient documentation

## 2018-02-13 DIAGNOSIS — E1142 Type 2 diabetes mellitus with diabetic polyneuropathy: Secondary | ICD-10-CM | POA: Diagnosis not present

## 2018-02-13 DIAGNOSIS — E1165 Type 2 diabetes mellitus with hyperglycemia: Secondary | ICD-10-CM

## 2018-02-13 DIAGNOSIS — IMO0002 Reserved for concepts with insufficient information to code with codable children: Secondary | ICD-10-CM

## 2018-02-13 DIAGNOSIS — E118 Type 2 diabetes mellitus with unspecified complications: Secondary | ICD-10-CM

## 2018-02-13 NOTE — Progress Notes (Signed)
Diabetes Self-Management Education  Visit Type: Follow-up  Appt. Start Time: 1400 Appt. End Time: 6384  02/13/2018  [name], identified by name and date of birth, is a 73 y.o. female with a diagnosis of Diabetes:  Type 2 diabetes  ASSESSMENT Other history includespolyneuropathy, hyperlipidemia, HTN, COPD, osteoporosis, smokes toe amputation, dental issues and few teeth, and schizoaffective disorder.  Medication includes Metformin, Glipizide,Basaglar 36 units q HS, andNovolog 11 units before each meal.  She is not taking the glipizide currently and is not consistently taking the insulin.  She is taking 7 units of Novolog before breakfast, and 9 units before lunch and dinner.She states that Lantus is no longer covered by her insurance.She states that the Metformin was reintroduced 3-4 months ago and she has tolerated well.  Patient lives alone. She has 2 children nearby who are helpful. She moved here 12 years ago from [facility] and is retired from the [employer]. She relies on [employer] or the city bus for transportation.   Patient states that she has had increased anxiety and depression for the past 3 weeks.  She has increased SOB and this is very noticeable during this visit.  She states they are discussing oxygen but she smokes heavily. She does not feel that she can stop smoking.  She is not taking her medication including insulin consistently.  She states that the depression is effecting her ability to care for herself and states that she lacks motivation.  She is not checking her blood sugar.  She reports not rotating insulin injection sites and this was reviewed again today.  She drank the [employer] drink rather than eating breakfast this am and verbalized that she should have eaten breakfast instead.  She seems unwilling to change this.  Weight 200 lb (90.7 kg). Body mass index is 36 kg/m.  Diabetes Self-Management Education - 02/13/18 1500      Visit  Information   Visit Type  Follow-up      Initial Visit   Diabetes Type  Type 2    Are you taking your medications as prescribed?  No   not when depressed     Health Coping   How would you rate your overall health?  Poor      Psychosocial Assessment   Self-care barriers  Debilitated state due to current medical condition    Self-management support  Doctor's office;CDE visits    Other persons present  Patient    Patient Concerns  Nutrition/Meal planning;Problem Solving;Glycemic Control;Weight Control    Special Needs  Simplified materials    Preferred Learning Style  No preference indicated    Learning Readiness  Contemplating    How often do you need to have someone help you when you read instructions, pamphlets, or other written materials from your doctor or pharmacy?  1 - Never      Pre-Education Assessment   Patient understands the diabetes disease and treatment process.  Demonstrates understanding / competency    Patient understands incorporating nutritional management into lifestyle.  Needs Review    Patient undertands incorporating physical activity into lifestyle.  Demonstrates understanding / competency    Patient understands using medications safely.  Needs Review    Patient understands monitoring blood glucose, interpreting and using results  Needs Review    Patient understands prevention, detection, and treatment of acute complications.  Demonstrates understanding / competency    Patient understands prevention, detection, and treatment of chronic complications.  Demonstrates understanding / competency  Patient understands how to develop strategies to address psychosocial issues.  Needs Review    Patient understands how to develop strategies to promote health/change behavior.  Needs Review      Complications   How often do you check your blood sugar?  0 times/day (not testing)      Dietary Intake   Breakfast  2 packs instant oatmeal, frozen berries, milk, coffee      Snack (morning)  none    Lunch  skips often    Snack (afternoon)  none    Dinner  hamburger helper    Snack (evening)  occasional    Beverage(s)  coffee with sweet and low and whole milk, up t 3 [employer]inks daily, water, whole milk      Exercise   Exercise Type  ADL's      Patient Education   Previous Diabetes Education  Yes (please comment)   1 month ago   Nutrition management   Meal options for control of blood glucose level and chronic complications.    Medications  Taught/reviewed insulin injection, site rotation, insulin storage and needle disposal.;Reviewed patients medication for diabetes, action, purpose, timing of dose and side effects.    Monitoring  Purpose and frequency of SMBG.    Psychosocial adjustment  Worked with patient to identify barriers to care and solutions    Personal strategies to promote health  Lifestyle issues that need to be addressed for better diabetes care      Individualized Goals (developed by patient)   Nutrition  General guidelines for healthy choices and portions discussed    Physical Activity  Exercise 3-5 times per week;15 minutes per day    Medications  take my medication as prescribed    Monitoring   test my blood glucose as discussed    Problem Solving  taking medication    Reducing Risk  stop smoking;increase portions of healthy fats    Health Coping  discuss diabetes with (comment)   MD, RD, CDE, psychiatrist     Patient Self-Evaluation of Goals - Patient rates self as meeting previously set goals (% of time)   Nutrition  25 - 50%    Physical Activity  25 - 50%    Medications  50 - 75 %    Monitoring  < 25%    Problem Solving  25 - 50%    Reducing Risk  25 - 50%    Health Coping  25 - 50%      Post-Education Assessment   Patient understands the diabetes disease and treatment process.  Demonstrates understanding / competency    Patient understands incorporating nutritional management into lifestyle.  Needs Review    Patient  undertands incorporating physical activity into lifestyle.  Demonstrates understanding / competency    Patient understands using medications safely.  Demonstrates understanding / competency    Patient understands monitoring blood glucose, interpreting and using results  Demonstrates understanding / competency    Patient understands prevention, detection, and treatment of acute complications.  Demonstrates understanding / competency    Patient understands prevention, detection, and treatment of chronic complications.  Demonstrates understanding / competency    Patient understands how to develop strategies to address psychosocial issues.  Needs Review    Patient understands how to develop strategies to promote health/change behavior.  Needs Review      Outcomes   Expected Outcomes  Other (comment)   expressed interest in learning but question follow through   Future DMSE  4-6 wks    Program Status  Completed      Subsequent Visit   Since your last visit have you experienced any weight changes?  Loss    Weight Loss (lbs)  3    Since your last visit, are you checking your blood glucose at least once a day?  No       Individualized Plan for Diabetes Self-Management Training:   Learning Objective:  Patient will have a greater understanding of diabetes self-management. Patient education plan is to attend individual and/or group sessions per assessed needs and concerns.   Plan:   Patient Instructions  Take your medication every day as prescribed Give your insulin in a different place each time   Start checking your blood sugar again each morning and 2 hours after you start dinner.   Try to avoid the [employer] drink.  This has about 3 tablespoons of sugar and can make your blood sugar go up.  Breakfast, lunch, dinner each day Add more vegetables and fruit Small amount of butter When you are upset do crafts rather than eat a snack.   Make an eye appointment   Expected Outcomes:   Other (comment)(expressed interest in learning but question follow through)  If problems or questions, patient to contact team via:  Phone  Future DSME appointment: 4-6 wks

## 2018-02-13 NOTE — Patient Instructions (Signed)
Take your medication every day as prescribed Give your insulin in a different place each time   Start checking your blood sugar again each morning and 2 hours after you start dinner.   Try to avoid the [employer] drink.  This has about 3 tablespoons of sugar and can make your blood sugar go up.  Breakfast, lunch, dinner each day Add more vegetables and fruit Small amount of butter When you are upset do crafts rather than eat a snack.   Make an eye appointment

## 2018-02-27 ENCOUNTER — Emergency Department (HOSPITAL_COMMUNITY)
Admission: EM | Admit: 2018-02-27 | Discharge: 2018-02-27 | Disposition: A | Discharge status: Home / Self Care | Payer: Medicare Other | Attending: Emergency Medicine | Admitting: Emergency Medicine

## 2018-02-27 ENCOUNTER — Encounter (HOSPITAL_COMMUNITY): Payer: Self-pay | Admitting: *Deleted

## 2018-02-27 ENCOUNTER — Emergency Department (HOSPITAL_COMMUNITY): Payer: Medicare Other

## 2018-02-27 ENCOUNTER — Other Ambulatory Visit: Payer: Self-pay

## 2018-02-27 ENCOUNTER — Telehealth (HOSPITAL_COMMUNITY): Payer: Self-pay | Admitting: Emergency Medicine

## 2018-02-27 DIAGNOSIS — E119 Type 2 diabetes mellitus without complications: Secondary | ICD-10-CM | POA: Insufficient documentation

## 2018-02-27 DIAGNOSIS — Y9301 Activity, walking, marching and hiking: Secondary | ICD-10-CM | POA: Diagnosis not present

## 2018-02-27 DIAGNOSIS — M25461 Effusion, right knee: Secondary | ICD-10-CM | POA: Diagnosis not present

## 2018-02-27 DIAGNOSIS — I10 Essential (primary) hypertension: Secondary | ICD-10-CM | POA: Insufficient documentation

## 2018-02-27 DIAGNOSIS — Z79899 Other long term (current) drug therapy: Secondary | ICD-10-CM | POA: Insufficient documentation

## 2018-02-27 DIAGNOSIS — T07XXXA Unspecified multiple injuries, initial encounter: Secondary | ICD-10-CM

## 2018-02-27 DIAGNOSIS — S80212A Abrasion, left knee, initial encounter: Secondary | ICD-10-CM | POA: Insufficient documentation

## 2018-02-27 DIAGNOSIS — J449 Chronic obstructive pulmonary disease, unspecified: Secondary | ICD-10-CM | POA: Diagnosis not present

## 2018-02-27 DIAGNOSIS — S42291A Other displaced fracture of upper end of right humerus, initial encounter for closed fracture: Secondary | ICD-10-CM | POA: Diagnosis not present

## 2018-02-27 DIAGNOSIS — S0031XA Abrasion of nose, initial encounter: Secondary | ICD-10-CM | POA: Diagnosis not present

## 2018-02-27 DIAGNOSIS — S80211A Abrasion, right knee, initial encounter: Secondary | ICD-10-CM | POA: Insufficient documentation

## 2018-02-27 DIAGNOSIS — W19XXXA Unspecified fall, initial encounter: Secondary | ICD-10-CM

## 2018-02-27 DIAGNOSIS — W109XXA Fall (on) (from) unspecified stairs and steps, initial encounter: Secondary | ICD-10-CM | POA: Insufficient documentation

## 2018-02-27 DIAGNOSIS — Z794 Long term (current) use of insulin: Secondary | ICD-10-CM | POA: Diagnosis not present

## 2018-02-27 DIAGNOSIS — Y929 Unspecified place or not applicable: Secondary | ICD-10-CM | POA: Insufficient documentation

## 2018-02-27 DIAGNOSIS — Y999 Unspecified external cause status: Secondary | ICD-10-CM | POA: Insufficient documentation

## 2018-02-27 DIAGNOSIS — S0993XA Unspecified injury of face, initial encounter: Secondary | ICD-10-CM | POA: Diagnosis not present

## 2018-02-27 DIAGNOSIS — Z7982 Long term (current) use of aspirin: Secondary | ICD-10-CM | POA: Diagnosis not present

## 2018-02-27 DIAGNOSIS — S42201A Unspecified fracture of upper end of right humerus, initial encounter for closed fracture: Secondary | ICD-10-CM | POA: Diagnosis not present

## 2018-02-27 DIAGNOSIS — M25462 Effusion, left knee: Secondary | ICD-10-CM | POA: Diagnosis not present

## 2018-02-27 DIAGNOSIS — S4991XA Unspecified injury of right shoulder and upper arm, initial encounter: Secondary | ICD-10-CM | POA: Diagnosis present

## 2018-02-27 MED ORDER — MORPHINE SULFATE 15 MG PO TABS: 7.5000 mg | ORAL_TABLET | Freq: Four times a day (QID) | ORAL | 0 refills | Status: AC | PRN

## 2018-02-27 MED ORDER — FENTANYL CITRATE (PF) 100 MCG/2ML IJ SOLN
25.0000 ug | Freq: Once | INTRAMUSCULAR | Status: AC
  Administered 2018-02-27: 25 ug via INTRAVENOUS
  Filled 2018-02-27: qty 2

## 2018-02-27 MED ORDER — MORPHINE SULFATE 15 MG PO TABS: 7.5000 mg | ORAL_TABLET | Freq: Four times a day (QID) | ORAL | 0 refills | Status: DC | PRN

## 2018-02-27 MED ORDER — MORPHINE SULFATE (PF) 4 MG/ML IV SOLN
4.0000 mg | Freq: Once | INTRAVENOUS | Status: AC
  Administered 2018-02-27: 4 mg via INTRAVENOUS
  Filled 2018-02-27: qty 1

## 2018-02-27 NOTE — ED Notes (Signed)
Cleaned patient's face with warm soap and water.

## 2018-02-27 NOTE — ED Provider Notes (Signed)
[facility] DEPT Provider Note   CSN: [id number] Arrival date & time: 02/27/18  1112     History   Chief Complaint Chief Complaint  Patient presents with  . Fall  . Shoulder Injury    rt  . Knee Pain    bil    HPI [name] is a 73 y.o. female.  HPI Patient presents after a fall. Patient recalls the fall in its entirety. She notes that she was walking up concrete steps, fell forward, striking her face, right shoulder, both knees. No loss of consciousness, no chest pain either before or afterwards. No vision changes before or afterwards. No neck pain, no head pain, though she does have pain about her nose, mouth, right shoulder, both knees severe, worse with motion.  She is here with her son who assists with the HPI. Past Medical History:  Diagnosis Date  . Anxiety   . Arthritis   . Cataract   . COPD (chronic obstructive pulmonary disease) ([place])   . Depression   . Diabetes mellitus without complication ([place])   . Glaucoma   . Headache    "migraines im my 20's"  . Hyperlipidemia   . Hypertension   . Neuromuscular disorder ([place])   . Osteomyelitis ([place])    right second toe  . Osteoporosis   . Schizophrenia ([place])    schizoaffective  . Substance abuse [facility])     Patient Active Problem List   Diagnosis Date Noted  . Idiopathic chronic venous hypertension of both lower extremities with inflammation 06/07/2017  . Ulcer of toe of left foot, limited to breakdown of skin ([place]) 06/07/2017  . History of amputation of lesser toe of right foot ([place]) 11/05/2016  . Diabetic polyneuropathy associated with type 2 diabetes mellitus ([place]) 10/05/2016  . Fracture of one rib, left side, initial encounter for closed fracture 01/22/2016  . 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 COPD mixed type ([place]) 09/18/2015  . FREQUENCY, URINARY 07/10/2007  . 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 Smoker 02/23/2007  . Essential hypertension  02/23/2007  . DIAB W/O MENTION COMP TYPE II/UNS TYPE UNCNTRL 01/20/2007  . HYPERLIPIDEMIA 01/20/2007  . HEADACHE 01/20/2007    Past Surgical History:  Procedure Laterality Date  . AMPUTATION Right 10/29/2016   Procedure: Right Foot 2nd Toe Amputation at Metatarsophalangeal Joint;  Surgeon: Newt Minion, MD;  Location: [facility];  Service: Orthopedics;  Laterality: Right;  . EYE SURGERY    . KNEE SURGERY    . MULTIPLE TOOTH EXTRACTIONS    . VASCULAR SURGERY     vein surgery     OB History   No obstetric history on file.      Home Medications    Prior to Admission medications   Medication Sig Start Date End Date Taking? Authorizing Provider  albuterol (PROVENTIL HFA;VENTOLIN HFA) 108 (90 Base) MCG/ACT inhaler Inhale 2 puffs into the lungs every 6 (six) hours as needed for wheezing or shortness of breath.   Yes [provider]  amLODipine (NORVASC) 10 MG tablet Take 10 mg by mouth daily.   Yes [provider]  aspirin EC 81 MG tablet Take 81 mg by mouth daily.   Yes [provider]  atorvastatin (LIPITOR) 20 MG tablet Take 20 mg by mouth daily.   Yes [provider]  bimatoprost (LUMIGAN) 0.03 % ophthalmic solution Place 1 drop into both eyes at bedtime.   Yes [provider]  budesonide-formoterol (SYMBICORT) 160-4.5 MCG/ACT inhaler Inhale  2 puffs into the lungs 2 (two) times daily.   Yes [provider]  chlorproMAZINE (THORAZINE) 100 MG tablet Take 100 mg by mouth at bedtime.    Yes [provider]  hydrOXYzine (ATARAX/VISTARIL) 10 MG tablet Take 10 mg by mouth 2 (two) times daily.    Yes [provider]  ibuprofen (ADVIL,MOTRIN) 400 MG tablet Take 1 tablet (400 mg total) by mouth every 6 (six) hours as needed for moderate pain. 10/22/17  Yes Wurst, [place], PA-C  Insulin Glargine (BASAGLAR KWIKPEN) 100 UNIT/ML SOPN Inject 35 Units into the skin at bedtime.    Yes [provider]  NOVOLOG FLEXPEN 100  UNIT/ML FlexPen Inject 7-9 Units into the skin 3 (three) times daily with meals. Take 7 units in the am, 9 units at lunch and 9 units at dinner. 11/15/15  Yes [provider]  pregabalin (LYRICA) 100 MG capsule Take 100 mg by mouth 3 (three) times daily.   Yes [provider]  sertraline (ZOLOFT) 100 MG tablet Take 100 mg by mouth daily.   Yes [provider]  topiramate (TOPAMAX) 50 MG tablet Take 50 mg by mouth daily.  01/03/16  Yes [provider]  valsartan (DIOVAN) 160 MG tablet Take 160 mg by mouth daily.   Yes [provider]  vitamin C (ASCORBIC ACID) 500 MG tablet Take 500 mg by mouth daily as needed (cold prevention).   Yes [provider]  BD PEN NEEDLE NANO U/F 32G X 4 MM MISC  12/23/15   [provider]  doxycycline (VIBRAMYCIN) 100 MG capsule Take 1 capsule (100 mg total) by mouth 2 (two) times daily. Patient not taking: Reported on 02/27/2018 10/22/17   Wurst, [place], PA-C  glipiZIDE (GLUCOTROL XL) 10 MG 24 hr tablet Take 10 mg by mouth daily. 01/04/18   [provider]  metFORMIN (GLUCOPHAGE) 1000 MG tablet Take 1,000 mg by mouth 2 (two) times daily with a meal.     [provider]  metFORMIN (GLUCOPHAGE-XR) 750 MG 24 hr tablet Take 750 mg by mouth daily with breakfast.    [provider]    Family History Family History  Problem Relation Age of Onset  . Heart disease Father     Social History Social History   Tobacco Use  . Smoking status: Current Every Day Smoker    Packs/day: 2.00    Years: 53.00    Pack years: 106.00    Types: Cigarettes  . Smokeless tobacco: Never Used  Substance Use Topics  . Alcohol use: No    Alcohol/week: 0.0 standard drinks  . Drug use: No     Allergies   Sitagliptin phosphate   Review of Systems Review of Systems  Constitutional:       Per HPI, otherwise negative  HENT:       Per HPI, otherwise negative  Respiratory:       Per HPI,  otherwise negative  Cardiovascular:       Per HPI, otherwise negative  Gastrointestinal: Negative for vomiting.  Endocrine:       Negative aside from HPI  Genitourinary:       Neg aside from HPI   Musculoskeletal:       Per HPI, otherwise negative  Skin: Negative.   Allergic/Immunologic: Negative for immunocompromised state.  Neurological: Negative for syncope.     Physical Exam Updated Vital Signs BP (!) 172/96   Pulse 91   Temp 99 F (37.2 C) (Oral)  Resp 16   Wt 91 kg   SpO2 92%   BMI 36.11 kg/m   Physical Exam Vitals signs and nursing note reviewed.  Constitutional:      General: She is not in acute distress.    Appearance: She is well-developed.     Comments: Large elderly female awake and alert speaking clearly  HENT:     Head: Normocephalic.      Nose:      Mouth/Throat:   Eyes:     Conjunctiva/sclera: Conjunctivae normal.  Neck:     Musculoskeletal: Full passive range of motion without pain, normal range of motion and neck supple. No spinous process tenderness or muscular tenderness.  Cardiovascular:     Rate and Rhythm: Normal rate and regular rhythm.  Pulmonary:     Effort: Pulmonary effort is normal. No respiratory distress.     Breath sounds: Normal breath sounds. No stridor.  Abdominal:     General: There is no distension.  Musculoskeletal:     Right shoulder: She exhibits tenderness and bony tenderness.     Left shoulder: Normal.     Right wrist: Normal.     Comments: Both knees are similar in appearance with superficial abrasions bilaterally, and inferiorly, but no loss of range of motion, no loss of strength, no deformities, no substantial tenderness to palpation.  Patient flexes and extends the elbow when it is in isolation without any discomfort, though with some referred pain in the shoulder. Pronation, supination also unremarkable right elbow.  Right second toe status post amputation  Skin:    General: Skin is warm and dry.      Comments: Abrasions, both knees, unremarkable  Neurological:     Mental Status: She is alert and oriented to person, place, and time.     Cranial Nerves: No cranial nerve deficit.     Motor: No weakness, tremor or abnormal muscle tone.     Coordination: Coordination normal.      ED Treatments / Results  Labs (all labs ordered are listed, but only abnormal results are displayed) Labs Reviewed - No data to display  EKG EKG Interpretation  Date/Time:  Monday February 27 2018 11:55:59 EST Ventricular Rate:  69 PR Interval:    QRS Duration: 99 QT Interval:  431 QTC Calculation: 462 R Axis:   57 Text Interpretation:  Sinus rhythm Nonspecific T abnormalities, lateral leads ST-t wave abnormality Baseline wander in lead(s) I II aVR Artifact Abnormal ekg Confirmed by [name] [phone]) on 02/27/2018 12:03:59 PM   Radiology Dg Shoulder Right  Result Date: 02/27/2018 CLINICAL DATA:  Fall. EXAM: RIGHT SHOULDER - 2+ VIEW COMPARISON:  None. FINDINGS: Comminuted, displaced, impacted fracture of the proximal humerus involving the surgical neck and greater tuberosity. No dislocation. Moderate acromioclavicular osteoarthritis. Osteopenia. Soft tissues are unremarkable. IMPRESSION: 1. Displaced and impacted complex fracture of the proximal humerus. Electronically Signed   By: Titus Dubin M.D.   On: 02/27/2018 12:55   Dg Knee Complete 4 Views Left  Result Date: 02/27/2018 CLINICAL DATA:  Bilateral knee pain after fall. EXAM: RIGHT KNEE - COMPLETE 4+ VIEW; LEFT KNEE - COMPLETE 4+ VIEW COMPARISON:  Right knee x-rays dated April 18, 2006. FINDINGS: Left knee: No acute fracture or dislocation. Small joint effusion. Moderate medial compartment joint space narrowing. Osteopenia. Soft tissues are unremarkable. Vascular calcifications. Right knee: No acute fracture or dislocation. Small joint effusion. Progressive moderate medial and mild patellofemoral compartment joint space narrowing. Tricompartmental  osteophytes. Osteopenia. Soft tissues are  unremarkable. Vascular calcifications. IMPRESSION: 1.  No acute osseous abnormality.  Small bilateral joint effusions. 2. Bilateral knee osteoarthritis as described above, moderate in the medial compartments. Electronically Signed   By: Titus Dubin M.D.   On: 02/27/2018 12:58   Dg Knee Complete 4 Views Right  Result Date: 02/27/2018 CLINICAL DATA:  Bilateral knee pain after fall. EXAM: RIGHT KNEE - COMPLETE 4+ VIEW; LEFT KNEE - COMPLETE 4+ VIEW COMPARISON:  Right knee x-rays dated April 18, 2006. FINDINGS: Left knee: No acute fracture or dislocation. Small joint effusion. Moderate medial compartment joint space narrowing. Osteopenia. Soft tissues are unremarkable. Vascular calcifications. Right knee: No acute fracture or dislocation. Small joint effusion. Progressive moderate medial and mild patellofemoral compartment joint space narrowing. Tricompartmental osteophytes. Osteopenia. Soft tissues are unremarkable. Vascular calcifications. IMPRESSION: 1.  No acute osseous abnormality.  Small bilateral joint effusions. 2. Bilateral knee osteoarthritis as described above, moderate in the medial compartments. Electronically Signed   By: Titus Dubin M.D.   On: 02/27/2018 12:58   Ct Maxillofacial Wo Contrast  Result Date: 02/27/2018 CLINICAL DATA:  Facial injury after fall. EXAM: CT MAXILLOFACIAL WITHOUT CONTRAST TECHNIQUE: Multidetector CT imaging of the maxillofacial structures was performed. Multiplanar CT image reconstructions were also generated. COMPARISON:  None. FINDINGS: Osseous: No fracture or mandibular dislocation. No destructive process. Orbits: Negative. No traumatic or inflammatory finding. Sinuses: Clear. Soft tissues: Negative. Limited intracranial: No significant or unexpected finding. IMPRESSION: No abnormality seen in maxillofacial region. Electronically Signed   By: Marijo Conception, M.D.   On: 02/27/2018 13:15    Procedures Procedures  (including critical care time)  Medications Ordered in ED Medications  fentaNYL (SUBLIMAZE) injection 25 mcg (25 mcg Intravenous Given 02/27/18 1334)     Initial Impression / Assessment and Plan / ED Course  I have reviewed the triage vital signs and the nursing notes.  Pertinent labs & imaging results that were available during my care of the patient were reviewed by me and considered in my medical decision making (see chart for details).     2:15 PM Patient continues to have some pain, though diminished after receiving fentanyl. I have reviewed all findings, discussed them with patient and her son, and she has a paper copy of her x-ray demonstrating right shoulder fracture.  3:30 PM No distress, awake, alert, aware of all findings.  Final Clinical Impressions(s) / ED Diagnoses   Final diagnoses:  Fall, initial encounter  Humerus head fracture, right, closed, initial encounter  Abrasions of multiple sites      [name], MD 02/27/18 [phone]

## 2018-02-27 NOTE — ED Notes (Signed)
Patient transported to X-ray 

## 2018-02-27 NOTE — ED Notes (Signed)
Bed: TR17 Expected date:  Expected time:  Means of arrival:  Comments: Hold for Tri 1

## 2018-02-27 NOTE — Telephone Encounter (Signed)
Patient had gone to [facility] and they did not have the morphine.  Requested to change the prescription to [facility] on [address], where they have the morphine.  Medication was E prescribed.

## 2018-02-27 NOTE — ED Triage Notes (Signed)
Pt fell on concrete retuning from taking trash out, ? Rt shoulder dislocation abrasions to nose, bil knee pain. No LOC

## 2018-02-27 NOTE — ED Notes (Signed)
Oxygen applied after administration of Fentanyl. 2 L [place].

## 2018-02-27 NOTE — Discharge Instructions (Addendum)
As discussed, with your humerus fracture is important that you with our orthopedic colleagues.  With your abrasions, please keep them clean, dry, with a light coating of topical antibiotic until they have healed.  Return here for concerning changes in your condition.

## 2018-02-28 DIAGNOSIS — Z9981 Dependence on supplemental oxygen: Secondary | ICD-10-CM | POA: Diagnosis not present

## 2018-02-28 DIAGNOSIS — E1142 Type 2 diabetes mellitus with diabetic polyneuropathy: Secondary | ICD-10-CM | POA: Diagnosis not present

## 2018-02-28 DIAGNOSIS — J449 Chronic obstructive pulmonary disease, unspecified: Secondary | ICD-10-CM | POA: Diagnosis not present

## 2018-02-28 DIAGNOSIS — S80211D Abrasion, right knee, subsequent encounter: Secondary | ICD-10-CM | POA: Diagnosis not present

## 2018-02-28 DIAGNOSIS — Z794 Long term (current) use of insulin: Secondary | ICD-10-CM | POA: Diagnosis not present

## 2018-02-28 DIAGNOSIS — I10 Essential (primary) hypertension: Secondary | ICD-10-CM | POA: Diagnosis not present

## 2018-02-28 DIAGNOSIS — Z6837 Body mass index (BMI) 37.0-37.9, adult: Secondary | ICD-10-CM | POA: Diagnosis not present

## 2018-02-28 DIAGNOSIS — S42291D Other displaced fracture of upper end of right humerus, subsequent encounter for fracture with routine healing: Secondary | ICD-10-CM | POA: Diagnosis not present

## 2018-02-28 DIAGNOSIS — R2689 Other abnormalities of gait and mobility: Secondary | ICD-10-CM | POA: Diagnosis not present

## 2018-02-28 DIAGNOSIS — F17210 Nicotine dependence, cigarettes, uncomplicated: Secondary | ICD-10-CM | POA: Diagnosis not present

## 2018-02-28 DIAGNOSIS — S80212D Abrasion, left knee, subsequent encounter: Secondary | ICD-10-CM | POA: Diagnosis not present

## 2018-02-28 DIAGNOSIS — F251 Schizoaffective disorder, depressive type: Secondary | ICD-10-CM | POA: Diagnosis not present

## 2018-02-28 DIAGNOSIS — Z9181 History of falling: Secondary | ICD-10-CM | POA: Diagnosis not present

## 2018-03-02 ENCOUNTER — Ambulatory Visit (INDEPENDENT_AMBULATORY_CARE_PROVIDER_SITE_OTHER): Payer: Medicare Other | Admitting: Physician Assistant

## 2018-03-02 ENCOUNTER — Other Ambulatory Visit (INDEPENDENT_AMBULATORY_CARE_PROVIDER_SITE_OTHER): Payer: Self-pay | Admitting: Physician Assistant

## 2018-03-02 MED ORDER — OXYCODONE-ACETAMINOPHEN 5-325 MG PO TABS: 1.0000 | ORAL_TABLET | ORAL | 0 refills | Status: DC | PRN

## 2018-03-03 DIAGNOSIS — S42291D Other displaced fracture of upper end of right humerus, subsequent encounter for fracture with routine healing: Secondary | ICD-10-CM | POA: Diagnosis not present

## 2018-03-03 DIAGNOSIS — J449 Chronic obstructive pulmonary disease, unspecified: Secondary | ICD-10-CM | POA: Diagnosis not present

## 2018-03-03 DIAGNOSIS — S80211D Abrasion, right knee, subsequent encounter: Secondary | ICD-10-CM | POA: Diagnosis not present

## 2018-03-03 DIAGNOSIS — E1142 Type 2 diabetes mellitus with diabetic polyneuropathy: Secondary | ICD-10-CM | POA: Diagnosis not present

## 2018-03-03 DIAGNOSIS — F17210 Nicotine dependence, cigarettes, uncomplicated: Secondary | ICD-10-CM | POA: Diagnosis not present

## 2018-03-03 DIAGNOSIS — F251 Schizoaffective disorder, depressive type: Secondary | ICD-10-CM | POA: Diagnosis not present

## 2018-03-07 DIAGNOSIS — F251 Schizoaffective disorder, depressive type: Secondary | ICD-10-CM | POA: Diagnosis not present

## 2018-03-07 DIAGNOSIS — S42291D Other displaced fracture of upper end of right humerus, subsequent encounter for fracture with routine healing: Secondary | ICD-10-CM | POA: Diagnosis not present

## 2018-03-07 DIAGNOSIS — J449 Chronic obstructive pulmonary disease, unspecified: Secondary | ICD-10-CM | POA: Diagnosis not present

## 2018-03-07 DIAGNOSIS — E1142 Type 2 diabetes mellitus with diabetic polyneuropathy: Secondary | ICD-10-CM | POA: Diagnosis not present

## 2018-03-07 DIAGNOSIS — S80211D Abrasion, right knee, subsequent encounter: Secondary | ICD-10-CM | POA: Diagnosis not present

## 2018-03-07 DIAGNOSIS — F17210 Nicotine dependence, cigarettes, uncomplicated: Secondary | ICD-10-CM | POA: Diagnosis not present

## 2018-03-08 DIAGNOSIS — S80211D Abrasion, right knee, subsequent encounter: Secondary | ICD-10-CM | POA: Diagnosis not present

## 2018-03-08 DIAGNOSIS — E1142 Type 2 diabetes mellitus with diabetic polyneuropathy: Secondary | ICD-10-CM | POA: Diagnosis not present

## 2018-03-08 DIAGNOSIS — F17210 Nicotine dependence, cigarettes, uncomplicated: Secondary | ICD-10-CM | POA: Diagnosis not present

## 2018-03-08 DIAGNOSIS — S42291D Other displaced fracture of upper end of right humerus, subsequent encounter for fracture with routine healing: Secondary | ICD-10-CM | POA: Diagnosis not present

## 2018-03-08 DIAGNOSIS — F251 Schizoaffective disorder, depressive type: Secondary | ICD-10-CM | POA: Diagnosis not present

## 2018-03-08 DIAGNOSIS — J449 Chronic obstructive pulmonary disease, unspecified: Secondary | ICD-10-CM | POA: Diagnosis not present

## 2018-03-09 ENCOUNTER — Telehealth (INDEPENDENT_AMBULATORY_CARE_PROVIDER_SITE_OTHER): Payer: Self-pay | Admitting: Orthopedic Surgery

## 2018-03-09 ENCOUNTER — Ambulatory Visit (INDEPENDENT_AMBULATORY_CARE_PROVIDER_SITE_OTHER): Payer: Medicare Other

## 2018-03-09 ENCOUNTER — Encounter (INDEPENDENT_AMBULATORY_CARE_PROVIDER_SITE_OTHER): Payer: Self-pay | Admitting: Physician Assistant

## 2018-03-09 ENCOUNTER — Telehealth (INDEPENDENT_AMBULATORY_CARE_PROVIDER_SITE_OTHER): Payer: Self-pay | Admitting: Physician Assistant

## 2018-03-09 ENCOUNTER — Ambulatory Visit (INDEPENDENT_AMBULATORY_CARE_PROVIDER_SITE_OTHER): Payer: Medicare Other | Admitting: Physician Assistant

## 2018-03-09 DIAGNOSIS — F17210 Nicotine dependence, cigarettes, uncomplicated: Secondary | ICD-10-CM | POA: Diagnosis not present

## 2018-03-09 DIAGNOSIS — S42291D Other displaced fracture of upper end of right humerus, subsequent encounter for fracture with routine healing: Secondary | ICD-10-CM | POA: Diagnosis not present

## 2018-03-09 DIAGNOSIS — S42411A Displaced simple supracondylar fracture without intercondylar fracture of right humerus, initial encounter for closed fracture: Secondary | ICD-10-CM | POA: Diagnosis not present

## 2018-03-09 DIAGNOSIS — E1142 Type 2 diabetes mellitus with diabetic polyneuropathy: Secondary | ICD-10-CM | POA: Diagnosis not present

## 2018-03-09 DIAGNOSIS — J449 Chronic obstructive pulmonary disease, unspecified: Secondary | ICD-10-CM | POA: Diagnosis not present

## 2018-03-09 DIAGNOSIS — S80211D Abrasion, right knee, subsequent encounter: Secondary | ICD-10-CM | POA: Diagnosis not present

## 2018-03-09 DIAGNOSIS — F251 Schizoaffective disorder, depressive type: Secondary | ICD-10-CM | POA: Diagnosis not present

## 2018-03-09 MED ORDER — OXYCODONE-ACETAMINOPHEN 5-325 MG PO TABS: 1.0000 | ORAL_TABLET | Freq: Four times a day (QID) | ORAL | 0 refills | Status: DC | PRN

## 2018-03-09 NOTE — Addendum Note (Signed)
Addended by: Milas Gain on: 03/09/2018 01:42 PM   Modules accepted: Orders

## 2018-03-09 NOTE — Progress Notes (Signed)
Office Visit Note   Patient: [name]           Date of Birth: 1945/02/27           MRN: [id number] Visit Date: 03/09/2018              Requested by: Kristen Loader, [address] [place], [place] [address] PCP: Kristen Loader, FNP  Chief Complaint  Patient presents with  . Right Shoulder - Follow-up, Fracture      HPI: The patient is a 73 year old woman here with her son for follow-up of her right proximal humerus fracture.  She was seen in the [facility] ED following a fall on 02/27/2018 and diagnosed with a fracture and placed in a sling.  She has been continuing to wear the sling at all times.  She does smoke cigarettes.  She reports that the only way she has been able to tolerate pain is by taking her oxycodone.  [facility] seeing her for ambulation due to balance issues but they have not been addressing anything with the right upper extremity.  She reports that she is bruised all over the right arm and breast area.  Assessment & Plan: Visit Diagnoses:  1. Closed supracondylar fracture of right humerus, initial encounter     Plan: Counseled the patient that she may begin pendulum exercises out of her sling and start some wall walking with her fingers to tolerance.  She she was initially resistant to starting any type of exercises but we did demonstrate to her that she can start the pendulum exercises and tolerate these.  She can wear her sling otherwise when she is not doing her exercises.  She was instructed to take sure that she continues to utilize her hand wrist and range elbow to keep mobility in these joints as well.  Her oxycodone was refilled.  She will follow-up here in 4 weeks or sooner should she have difficulties in the interim.  Follow-Up Instructions: Return in about 4 weeks (around 04/06/2018).   Ortho Exam  Patient is alert, oriented, no adenopathy, well-dressed, normal affect, normal respiratory effort. The right upper  extremity has maturing ecchymosis over the right upper extremity and right breast area.  She is neurovascularly intact distally.  She has minimal swelling over the area.  She was able to tolerate pendulum exercises as demonstrated to do with her today and was encouraged to participate with pendulum exercises with therapist.  Imaging: Xr Shoulder Right  Result Date: 03/09/2018 2 view of the right shoulder shows avulsion fracture of the head of the humerus without change from initial radiographs no further collapse appreciated.  No images are attached to the encounter.  Labs: Lab Results  Component Value Date   HGBA1C 7.8 (H) 10/29/2016   HGBA1C 9.9 (H) 07/10/2007   HGBA1C 10.7 (H) 03/16/2007   ESRSEDRATE 45 (H) 05/11/2007     Lab Results  Component Value Date   ALBUMIN 4.1 01/22/2016   ALBUMIN 4.3 10/06/2009   ALBUMIN 4.0 07/10/2007    There is no height or weight on file to calculate BMI.  Orders:  Orders Placed This Encounter  Procedures  . XR Shoulder Right   No orders of the defined types were placed in this encounter.    Procedures: No procedures performed  Clinical Data: No additional findings.  ROS:  All other systems negative, except as noted in the HPI. Review of Systems  Objective: Vital Signs: There  were no vitals taken for this visit.  Specialty Comments:  No specialty comments available.  PMFS History: Patient Active Problem List   Diagnosis Date Noted  . Idiopathic chronic venous hypertension of both lower extremities with inflammation 06/07/2017  . Ulcer of toe of left foot, limited to breakdown of skin ([place]) 06/07/2017  . History of amputation of lesser toe of right foot ([place]) 11/05/2016  . Diabetic polyneuropathy associated with type 2 diabetes mellitus ([place]) 10/05/2016  . Fracture of one rib, left side, initial encounter for closed fracture 01/22/2016  . Postinflammatory pulmonary fibrosis ([place]) 09/21/2015  . COPD mixed type ([place])  09/18/2015  . FREQUENCY, URINARY 07/10/2007  . 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 Smoker 02/23/2007  . Essential hypertension 02/23/2007  . DIAB W/O MENTION COMP TYPE II/UNS TYPE UNCNTRL 01/20/2007  . HYPERLIPIDEMIA 01/20/2007  . HEADACHE 01/20/2007   Past Medical History:  Diagnosis Date  . Anxiety   . Arthritis   . Cataract   . COPD (chronic obstructive pulmonary disease) ([place])   . Depression   . Diabetes mellitus without complication ([place])   . Glaucoma   . Headache    "migraines im my 20's"  . Hyperlipidemia   . Hypertension   . Neuromuscular disorder ([place])   . Osteomyelitis ([place])    right second toe  . Osteoporosis   . Schizophrenia ([place])    schizoaffective  . Substance abuse ([place])     Family History  Problem Relation Age of Onset  . Heart disease Father     Past Surgical History:  Procedure Laterality Date  . AMPUTATION Right 10/29/2016   Procedure: Right Foot 2nd Toe Amputation at Metatarsophalangeal Joint;  Surgeon: Newt Minion, MD;  Location: [facility];  Service: Orthopedics;  Laterality: Right;  . EYE SURGERY    . KNEE SURGERY    . MULTIPLE TOOTH EXTRACTIONS    . VASCULAR SURGERY     vein surgery   Social History   Occupational History  . Not on file  Tobacco Use  . Smoking status: Current Every Day Smoker    Packs/day: 2.00    Years: 53.00    Pack years: 106.00    Types: Cigarettes  . Smokeless tobacco: Never Used  Substance and Sexual Activity  . Alcohol use: No    Alcohol/week: 0.0 standard drinks  . Drug use: No  . Sexual activity: Not on file

## 2018-03-09 NOTE — Telephone Encounter (Signed)
[name] from [facility] left a message stating that they received a RX for the patient to be seen.  Patient is already established with them.  [name] would like to get a RX for a OT Evaluation.  CB#[phone].  Thank you.

## 2018-03-10 ENCOUNTER — Other Ambulatory Visit (INDEPENDENT_AMBULATORY_CARE_PROVIDER_SITE_OTHER): Payer: Self-pay

## 2018-03-10 NOTE — Telephone Encounter (Signed)
Done

## 2018-03-13 DIAGNOSIS — F17210 Nicotine dependence, cigarettes, uncomplicated: Secondary | ICD-10-CM | POA: Diagnosis not present

## 2018-03-13 DIAGNOSIS — S80211D Abrasion, right knee, subsequent encounter: Secondary | ICD-10-CM | POA: Diagnosis not present

## 2018-03-13 DIAGNOSIS — S42291D Other displaced fracture of upper end of right humerus, subsequent encounter for fracture with routine healing: Secondary | ICD-10-CM | POA: Diagnosis not present

## 2018-03-13 DIAGNOSIS — F251 Schizoaffective disorder, depressive type: Secondary | ICD-10-CM | POA: Diagnosis not present

## 2018-03-13 DIAGNOSIS — E1142 Type 2 diabetes mellitus with diabetic polyneuropathy: Secondary | ICD-10-CM | POA: Diagnosis not present

## 2018-03-13 DIAGNOSIS — J449 Chronic obstructive pulmonary disease, unspecified: Secondary | ICD-10-CM | POA: Diagnosis not present

## 2018-03-14 DIAGNOSIS — F251 Schizoaffective disorder, depressive type: Secondary | ICD-10-CM | POA: Diagnosis not present

## 2018-03-14 DIAGNOSIS — S42291D Other displaced fracture of upper end of right humerus, subsequent encounter for fracture with routine healing: Secondary | ICD-10-CM | POA: Diagnosis not present

## 2018-03-14 DIAGNOSIS — J449 Chronic obstructive pulmonary disease, unspecified: Secondary | ICD-10-CM | POA: Diagnosis not present

## 2018-03-14 DIAGNOSIS — S80211D Abrasion, right knee, subsequent encounter: Secondary | ICD-10-CM | POA: Diagnosis not present

## 2018-03-14 DIAGNOSIS — F17210 Nicotine dependence, cigarettes, uncomplicated: Secondary | ICD-10-CM | POA: Diagnosis not present

## 2018-03-14 DIAGNOSIS — E1142 Type 2 diabetes mellitus with diabetic polyneuropathy: Secondary | ICD-10-CM | POA: Diagnosis not present

## 2018-03-15 DIAGNOSIS — F17210 Nicotine dependence, cigarettes, uncomplicated: Secondary | ICD-10-CM | POA: Diagnosis not present

## 2018-03-15 DIAGNOSIS — S80211D Abrasion, right knee, subsequent encounter: Secondary | ICD-10-CM | POA: Diagnosis not present

## 2018-03-15 DIAGNOSIS — J449 Chronic obstructive pulmonary disease, unspecified: Secondary | ICD-10-CM | POA: Diagnosis not present

## 2018-03-15 DIAGNOSIS — E1142 Type 2 diabetes mellitus with diabetic polyneuropathy: Secondary | ICD-10-CM | POA: Diagnosis not present

## 2018-03-15 DIAGNOSIS — F251 Schizoaffective disorder, depressive type: Secondary | ICD-10-CM | POA: Diagnosis not present

## 2018-03-15 DIAGNOSIS — S42291D Other displaced fracture of upper end of right humerus, subsequent encounter for fracture with routine healing: Secondary | ICD-10-CM | POA: Diagnosis not present

## 2018-03-16 DIAGNOSIS — S42291D Other displaced fracture of upper end of right humerus, subsequent encounter for fracture with routine healing: Secondary | ICD-10-CM | POA: Diagnosis not present

## 2018-03-16 DIAGNOSIS — J449 Chronic obstructive pulmonary disease, unspecified: Secondary | ICD-10-CM | POA: Diagnosis not present

## 2018-03-16 DIAGNOSIS — E1142 Type 2 diabetes mellitus with diabetic polyneuropathy: Secondary | ICD-10-CM | POA: Diagnosis not present

## 2018-03-16 DIAGNOSIS — S80211D Abrasion, right knee, subsequent encounter: Secondary | ICD-10-CM | POA: Diagnosis not present

## 2018-03-16 DIAGNOSIS — F17210 Nicotine dependence, cigarettes, uncomplicated: Secondary | ICD-10-CM | POA: Diagnosis not present

## 2018-03-16 DIAGNOSIS — F251 Schizoaffective disorder, depressive type: Secondary | ICD-10-CM | POA: Diagnosis not present

## 2018-03-17 DIAGNOSIS — E1142 Type 2 diabetes mellitus with diabetic polyneuropathy: Secondary | ICD-10-CM | POA: Diagnosis not present

## 2018-03-17 DIAGNOSIS — S42291D Other displaced fracture of upper end of right humerus, subsequent encounter for fracture with routine healing: Secondary | ICD-10-CM | POA: Diagnosis not present

## 2018-03-17 DIAGNOSIS — S80211D Abrasion, right knee, subsequent encounter: Secondary | ICD-10-CM | POA: Diagnosis not present

## 2018-03-17 DIAGNOSIS — J449 Chronic obstructive pulmonary disease, unspecified: Secondary | ICD-10-CM | POA: Diagnosis not present

## 2018-03-17 DIAGNOSIS — F251 Schizoaffective disorder, depressive type: Secondary | ICD-10-CM | POA: Diagnosis not present

## 2018-03-17 DIAGNOSIS — F17210 Nicotine dependence, cigarettes, uncomplicated: Secondary | ICD-10-CM | POA: Diagnosis not present

## 2018-03-20 ENCOUNTER — Ambulatory Visit: Payer: Medicare Other | Admitting: Dietician

## 2018-03-20 DIAGNOSIS — E1142 Type 2 diabetes mellitus with diabetic polyneuropathy: Secondary | ICD-10-CM | POA: Diagnosis not present

## 2018-03-20 DIAGNOSIS — S42291D Other displaced fracture of upper end of right humerus, subsequent encounter for fracture with routine healing: Secondary | ICD-10-CM | POA: Diagnosis not present

## 2018-03-20 DIAGNOSIS — F17210 Nicotine dependence, cigarettes, uncomplicated: Secondary | ICD-10-CM | POA: Diagnosis not present

## 2018-03-20 DIAGNOSIS — J449 Chronic obstructive pulmonary disease, unspecified: Secondary | ICD-10-CM | POA: Diagnosis not present

## 2018-03-20 DIAGNOSIS — F251 Schizoaffective disorder, depressive type: Secondary | ICD-10-CM | POA: Diagnosis not present

## 2018-03-20 DIAGNOSIS — S80211D Abrasion, right knee, subsequent encounter: Secondary | ICD-10-CM | POA: Diagnosis not present

## 2018-03-21 ENCOUNTER — Encounter: Payer: Self-pay | Admitting: Dietician

## 2018-03-21 ENCOUNTER — Encounter: Payer: Medicare Other | Attending: Family Medicine | Admitting: Dietician

## 2018-03-21 DIAGNOSIS — Z713 Dietary counseling and surveillance: Secondary | ICD-10-CM | POA: Insufficient documentation

## 2018-03-21 DIAGNOSIS — E1142 Type 2 diabetes mellitus with diabetic polyneuropathy: Secondary | ICD-10-CM | POA: Insufficient documentation

## 2018-03-21 DIAGNOSIS — IMO0002 Reserved for concepts with insufficient information to code with codable children: Secondary | ICD-10-CM

## 2018-03-21 DIAGNOSIS — Z794 Long term (current) use of insulin: Secondary | ICD-10-CM | POA: Diagnosis not present

## 2018-03-21 DIAGNOSIS — E118 Type 2 diabetes mellitus with unspecified complications: Secondary | ICD-10-CM

## 2018-03-21 DIAGNOSIS — E1165 Type 2 diabetes mellitus with hyperglycemia: Secondary | ICD-10-CM

## 2018-03-21 NOTE — Progress Notes (Signed)
Diabetes Self-Management Education  Visit Type: Follow-up  Appt. Start Time: 1400 Appt. End Time: 1440  03/21/2018  [name], identified by name and date of birth, is a 73 y.o. female with a diagnosis of Diabetes: Type 2.   ASSESSMENT Patient is here today with her son [name]. He also has type 2 diabetes and is verbalizes knowledge about nutrition.  He along with his brother and sister in law have been helping to care for [name].  She continues to live in her apartment. She broke her right shoulder (February 3rd) falling taking out the garbage.  She is right handed.   She now uses oxygen but did not bring it today as she does not have the travel concentrator.  She is still smoking but has reduced this significantly and only smokes outside (without oxygen) when someone is there.  Discussed the importance of not  smoking with oxygen as well as importance of using the oxygen at all times including when she is at appointments.  She continues to use increased [employer] drinks and regular soda.  States that she has decreased to 2 Monster drinks per day and is unable to quit these as she is too stressed.  She also does not like the diet monster drinks. She is unable to cook for herself.  She is eating simply or her sons are helping. PT and a nurse are currently coming 2 times per week.  They are checking her BG 2-3 times per week with values of 250-300's.  Continued to discuss the impact of the regular drinks on her BG.  Medications include Metformin, glipizide, Insulin Glargine, and Novolog.  Her sons have been giving the Glargine but have not been giving the Novolog.  Discussed importance of taking insulin, timing, site rotation, and appropriate doses.    History includes polyneuropathy, hyperlipidemia, HTN, COPD, osteoporosis, smokes, toe amputation, dental issues and few teeth, and schizoaffective disorder.  Patient lives alone.  She has 2 children nearby who are helpful.   She moved here 12 years ago from [place] and is retired from the [employer].  She relies on [employer] or the city bus for transportation.   Weight 203 lb (92.1 kg). sling Body mass index is 36.54 kg/m.  Diabetes Self-Management Education - 03/21/18 1500      Visit Information   Visit Type  Follow-up      Initial Visit   Diabetes Type  Type 2    Are you taking your medications as prescribed?  No      Health Coping   How would you rate your overall health?  Poor      Psychosocial Assessment   Self-care barriers  Debilitated state due to current medical condition    Self-management support  Doctor's office    Other persons present  Patient;Family Member    Patient Concerns  Nutrition/Meal planning;Medication;Monitoring;Weight Control    Special Needs  Simplified materials    Preferred Learning Style  Auditory;Visual    Learning Readiness  Contemplating    How often do you need to have someone help you when you read instructions, pamphlets, or other written materials from your doctor or pharmacy?  1 - Never      Pre-Education Assessment   Patient understands the diabetes disease and treatment process.  Demonstrates understanding / competency    Patient understands incorporating nutritional management into lifestyle.  Needs Instruction    Patient undertands incorporating physical activity into lifestyle.  Demonstrates understanding /  competency    Patient understands using medications safely.  Needs Instruction    Patient understands monitoring blood glucose, interpreting and using results  Needs Instruction    Patient understands prevention, detection, and treatment of acute complications.  Demonstrates understanding / competency    Patient understands prevention, detection, and treatment of chronic complications.  Demonstrates understanding / competency    Patient understands how to develop strategies to address psychosocial issues.  Needs Review    Patient understands how to  develop strategies to promote health/change behavior.  Needs Review      Complications   How often do you check your blood sugar?  3-4 times / week    Postprandial Blood glucose range (mg/dL)  >200    Number of hypoglycemic episodes per month  0    Number of hyperglycemic episodes per week  21    Can you tell when your blood sugar is high?  No    Have you had a dilated eye exam in the past 12 months?  No    Have you had a dental exam in the past 12 months?  No    Are you checking your feet?  Yes      Exercise   Exercise Type  ADL's      Patient Education   Previous Diabetes Education  Yes (please comment)   one month ago   Nutrition management   Meal timing in regards to the patients' current diabetes medication.;Meal options for control of blood glucose level and chronic complications.    Medications  Reviewed patients medication for diabetes, action, purpose, timing of dose and side effects.;Other (comment)   reviewed injection sites, amounts, length of action with son   Monitoring  Purpose and frequency of SMBG.    Psychosocial adjustment  Worked with patient to identify barriers to care and solutions    Personal strategies to promote health  Lifestyle issues that need to be addressed for better diabetes care      Individualized Goals (developed by patient)   Nutrition  General guidelines for healthy choices and portions discussed    Physical Activity  Not Applicable    Medications  take my medication as prescribed    Monitoring   test my blood glucose as discussed    Problem Solving  aking medication    Reducing Risk  stop smoking    Health Coping  discuss diabetes with (comment)   MD, RD, CDE, psychiatrist     Post-Education Assessment   Patient understands the diabetes disease and treatment process.  Demonstrates understanding / competency    Patient understands incorporating nutritional management into lifestyle.  Needs Review    Patient undertands incorporating physical  activity into lifestyle.  Demonstrates understanding / competency    Patient understands using medications safely.  Demonstrates understanding / competency    Patient understands monitoring blood glucose, interpreting and using results  Demonstrates understanding / competency    Patient understands prevention, detection, and treatment of acute complications.  Demonstrates understanding / competency    Patient understands prevention, detection, and treatment of chronic complications.  Demonstrates understanding / competency    Patient understands how to develop strategies to address psychosocial issues.  Needs Review    Patient understands how to develop strategies to promote health/change behavior.  Needs Review      Outcomes   Expected Outcomes  Other (comment)   multiple barriers   Future DMSE  4-6 wks    Program Status  Completed  Subsequent Visit   Since your last visit have you continued or begun to take your medications as prescribed?  No    Since your last visit have you experienced any weight changes?  Gain    Weight Loss (lbs)  3    Since your last visit, are you checking your blood glucose at least once a day?  No       Individualized Plan for Diabetes Self-Management Training:   Learning Objective:  Patient will have a greater understanding of diabetes self-management. Patient education plan is to attend individual and/or group sessions per assessed needs and concerns.   Plan:   Patient Instructions  Take your medication every day as prescribed Give your insulin in a different place each time   Start checking your blood sugar again each morning and 2 hours after you start dinner.   Try to avoid the Monster drink. This has about 3 tablespoons of sugar and can make your blood sugar go up. Avoid smoking.  Breakfast, lunch, dinner each day Add more vegetables and fruit  Make an eye appointment  Basaglar is the long acting insulin.  Take 35 units each day at  bedtime. Novolog is your meal time insulin.  Take 7 units before breakfast and 9 units before lunch and dinner.   Expected Outcomes:  Other (comment)(multiple barriers)  Education material provided:   If problems or questions, patient to contact team via:  Phone  Future DSME appointment: 4-6 wks

## 2018-03-21 NOTE — Patient Instructions (Addendum)
Take your medication every day as prescribed Give your insulin in a different place each time   Start checking your blood sugar again each morning and 2 hours after you start dinner.   Try to avoid the Monster drink. This has about 3 tablespoons of sugar and can make your blood sugar go up. Avoid smoking.  Breakfast, lunch, dinner each day Add more vegetables and fruit  Make an eye appointment  Basaglar is the long acting insulin.  Take 35 units each day at bedtime. Novolog is your meal time insulin.  Take 7 units before breakfast and 9 units before lunch and dinner.

## 2018-03-22 DIAGNOSIS — J449 Chronic obstructive pulmonary disease, unspecified: Secondary | ICD-10-CM | POA: Diagnosis not present

## 2018-03-22 DIAGNOSIS — E1142 Type 2 diabetes mellitus with diabetic polyneuropathy: Secondary | ICD-10-CM | POA: Diagnosis not present

## 2018-03-22 DIAGNOSIS — S80211D Abrasion, right knee, subsequent encounter: Secondary | ICD-10-CM | POA: Diagnosis not present

## 2018-03-22 DIAGNOSIS — S42291D Other displaced fracture of upper end of right humerus, subsequent encounter for fracture with routine healing: Secondary | ICD-10-CM | POA: Diagnosis not present

## 2018-03-22 DIAGNOSIS — F17210 Nicotine dependence, cigarettes, uncomplicated: Secondary | ICD-10-CM | POA: Diagnosis not present

## 2018-03-22 DIAGNOSIS — F251 Schizoaffective disorder, depressive type: Secondary | ICD-10-CM | POA: Diagnosis not present

## 2018-03-23 DIAGNOSIS — F251 Schizoaffective disorder, depressive type: Secondary | ICD-10-CM | POA: Diagnosis not present

## 2018-03-23 DIAGNOSIS — E1142 Type 2 diabetes mellitus with diabetic polyneuropathy: Secondary | ICD-10-CM | POA: Diagnosis not present

## 2018-03-23 DIAGNOSIS — S80211D Abrasion, right knee, subsequent encounter: Secondary | ICD-10-CM | POA: Diagnosis not present

## 2018-03-23 DIAGNOSIS — S42291D Other displaced fracture of upper end of right humerus, subsequent encounter for fracture with routine healing: Secondary | ICD-10-CM | POA: Diagnosis not present

## 2018-03-23 DIAGNOSIS — F17210 Nicotine dependence, cigarettes, uncomplicated: Secondary | ICD-10-CM | POA: Diagnosis not present

## 2018-03-23 DIAGNOSIS — J449 Chronic obstructive pulmonary disease, unspecified: Secondary | ICD-10-CM | POA: Diagnosis not present

## 2018-03-24 DIAGNOSIS — F419 Anxiety disorder, unspecified: Secondary | ICD-10-CM | POA: Diagnosis not present

## 2018-03-24 DIAGNOSIS — F259 Schizoaffective disorder, unspecified: Secondary | ICD-10-CM | POA: Diagnosis not present

## 2018-03-24 DIAGNOSIS — F331 Major depressive disorder, recurrent, moderate: Secondary | ICD-10-CM | POA: Diagnosis not present

## 2018-03-27 DIAGNOSIS — E1142 Type 2 diabetes mellitus with diabetic polyneuropathy: Secondary | ICD-10-CM | POA: Diagnosis not present

## 2018-03-27 DIAGNOSIS — F17210 Nicotine dependence, cigarettes, uncomplicated: Secondary | ICD-10-CM | POA: Diagnosis not present

## 2018-03-27 DIAGNOSIS — F251 Schizoaffective disorder, depressive type: Secondary | ICD-10-CM | POA: Diagnosis not present

## 2018-03-27 DIAGNOSIS — J449 Chronic obstructive pulmonary disease, unspecified: Secondary | ICD-10-CM | POA: Diagnosis not present

## 2018-03-27 DIAGNOSIS — S42291D Other displaced fracture of upper end of right humerus, subsequent encounter for fracture with routine healing: Secondary | ICD-10-CM | POA: Diagnosis not present

## 2018-03-27 DIAGNOSIS — S80211D Abrasion, right knee, subsequent encounter: Secondary | ICD-10-CM | POA: Diagnosis not present

## 2018-03-28 DIAGNOSIS — E1142 Type 2 diabetes mellitus with diabetic polyneuropathy: Secondary | ICD-10-CM | POA: Diagnosis not present

## 2018-03-28 DIAGNOSIS — F251 Schizoaffective disorder, depressive type: Secondary | ICD-10-CM | POA: Diagnosis not present

## 2018-03-28 DIAGNOSIS — S42291D Other displaced fracture of upper end of right humerus, subsequent encounter for fracture with routine healing: Secondary | ICD-10-CM | POA: Diagnosis not present

## 2018-03-28 DIAGNOSIS — F17210 Nicotine dependence, cigarettes, uncomplicated: Secondary | ICD-10-CM | POA: Diagnosis not present

## 2018-03-28 DIAGNOSIS — S80211D Abrasion, right knee, subsequent encounter: Secondary | ICD-10-CM | POA: Diagnosis not present

## 2018-03-28 DIAGNOSIS — J449 Chronic obstructive pulmonary disease, unspecified: Secondary | ICD-10-CM | POA: Diagnosis not present

## 2018-03-29 DIAGNOSIS — S80211D Abrasion, right knee, subsequent encounter: Secondary | ICD-10-CM | POA: Diagnosis not present

## 2018-03-29 DIAGNOSIS — F251 Schizoaffective disorder, depressive type: Secondary | ICD-10-CM | POA: Diagnosis not present

## 2018-03-29 DIAGNOSIS — F17210 Nicotine dependence, cigarettes, uncomplicated: Secondary | ICD-10-CM | POA: Diagnosis not present

## 2018-03-29 DIAGNOSIS — J449 Chronic obstructive pulmonary disease, unspecified: Secondary | ICD-10-CM | POA: Diagnosis not present

## 2018-03-29 DIAGNOSIS — S42291D Other displaced fracture of upper end of right humerus, subsequent encounter for fracture with routine healing: Secondary | ICD-10-CM | POA: Diagnosis not present

## 2018-03-29 DIAGNOSIS — E1142 Type 2 diabetes mellitus with diabetic polyneuropathy: Secondary | ICD-10-CM | POA: Diagnosis not present

## 2018-03-30 DIAGNOSIS — S80212D Abrasion, left knee, subsequent encounter: Secondary | ICD-10-CM | POA: Diagnosis not present

## 2018-03-30 DIAGNOSIS — S80211D Abrasion, right knee, subsequent encounter: Secondary | ICD-10-CM | POA: Diagnosis not present

## 2018-03-30 DIAGNOSIS — Z9181 History of falling: Secondary | ICD-10-CM | POA: Diagnosis not present

## 2018-03-30 DIAGNOSIS — J449 Chronic obstructive pulmonary disease, unspecified: Secondary | ICD-10-CM | POA: Diagnosis not present

## 2018-03-30 DIAGNOSIS — R2689 Other abnormalities of gait and mobility: Secondary | ICD-10-CM | POA: Diagnosis not present

## 2018-03-30 DIAGNOSIS — F251 Schizoaffective disorder, depressive type: Secondary | ICD-10-CM | POA: Diagnosis not present

## 2018-03-30 DIAGNOSIS — E1142 Type 2 diabetes mellitus with diabetic polyneuropathy: Secondary | ICD-10-CM | POA: Diagnosis not present

## 2018-03-30 DIAGNOSIS — S42291D Other displaced fracture of upper end of right humerus, subsequent encounter for fracture with routine healing: Secondary | ICD-10-CM | POA: Diagnosis not present

## 2018-03-30 DIAGNOSIS — F17210 Nicotine dependence, cigarettes, uncomplicated: Secondary | ICD-10-CM | POA: Diagnosis not present

## 2018-03-30 DIAGNOSIS — Z794 Long term (current) use of insulin: Secondary | ICD-10-CM | POA: Diagnosis not present

## 2018-03-30 DIAGNOSIS — Z6837 Body mass index (BMI) 37.0-37.9, adult: Secondary | ICD-10-CM | POA: Diagnosis not present

## 2018-03-30 DIAGNOSIS — I10 Essential (primary) hypertension: Secondary | ICD-10-CM | POA: Diagnosis not present

## 2018-03-30 DIAGNOSIS — Z9981 Dependence on supplemental oxygen: Secondary | ICD-10-CM | POA: Diagnosis not present

## 2018-03-31 DIAGNOSIS — S42291D Other displaced fracture of upper end of right humerus, subsequent encounter for fracture with routine healing: Secondary | ICD-10-CM | POA: Diagnosis not present

## 2018-03-31 DIAGNOSIS — F17210 Nicotine dependence, cigarettes, uncomplicated: Secondary | ICD-10-CM | POA: Diagnosis not present

## 2018-03-31 DIAGNOSIS — F251 Schizoaffective disorder, depressive type: Secondary | ICD-10-CM | POA: Diagnosis not present

## 2018-03-31 DIAGNOSIS — J449 Chronic obstructive pulmonary disease, unspecified: Secondary | ICD-10-CM | POA: Diagnosis not present

## 2018-03-31 DIAGNOSIS — S80211D Abrasion, right knee, subsequent encounter: Secondary | ICD-10-CM | POA: Diagnosis not present

## 2018-03-31 DIAGNOSIS — E1142 Type 2 diabetes mellitus with diabetic polyneuropathy: Secondary | ICD-10-CM | POA: Diagnosis not present

## 2018-04-03 DIAGNOSIS — E1142 Type 2 diabetes mellitus with diabetic polyneuropathy: Secondary | ICD-10-CM | POA: Diagnosis not present

## 2018-04-03 DIAGNOSIS — F17210 Nicotine dependence, cigarettes, uncomplicated: Secondary | ICD-10-CM | POA: Diagnosis not present

## 2018-04-03 DIAGNOSIS — S42291D Other displaced fracture of upper end of right humerus, subsequent encounter for fracture with routine healing: Secondary | ICD-10-CM | POA: Diagnosis not present

## 2018-04-03 DIAGNOSIS — S80211D Abrasion, right knee, subsequent encounter: Secondary | ICD-10-CM | POA: Diagnosis not present

## 2018-04-03 DIAGNOSIS — F251 Schizoaffective disorder, depressive type: Secondary | ICD-10-CM | POA: Diagnosis not present

## 2018-04-03 DIAGNOSIS — J449 Chronic obstructive pulmonary disease, unspecified: Secondary | ICD-10-CM | POA: Diagnosis not present

## 2018-04-04 DIAGNOSIS — F251 Schizoaffective disorder, depressive type: Secondary | ICD-10-CM | POA: Diagnosis not present

## 2018-04-04 DIAGNOSIS — S42291D Other displaced fracture of upper end of right humerus, subsequent encounter for fracture with routine healing: Secondary | ICD-10-CM | POA: Diagnosis not present

## 2018-04-04 DIAGNOSIS — S80211D Abrasion, right knee, subsequent encounter: Secondary | ICD-10-CM | POA: Diagnosis not present

## 2018-04-04 DIAGNOSIS — E1142 Type 2 diabetes mellitus with diabetic polyneuropathy: Secondary | ICD-10-CM | POA: Diagnosis not present

## 2018-04-04 DIAGNOSIS — J449 Chronic obstructive pulmonary disease, unspecified: Secondary | ICD-10-CM | POA: Diagnosis not present

## 2018-04-04 DIAGNOSIS — F17210 Nicotine dependence, cigarettes, uncomplicated: Secondary | ICD-10-CM | POA: Diagnosis not present

## 2018-04-06 ENCOUNTER — Ambulatory Visit (INDEPENDENT_AMBULATORY_CARE_PROVIDER_SITE_OTHER): Payer: Medicare Other | Admitting: Physician Assistant

## 2018-04-06 ENCOUNTER — Other Ambulatory Visit: Payer: Self-pay

## 2018-04-06 ENCOUNTER — Encounter (INDEPENDENT_AMBULATORY_CARE_PROVIDER_SITE_OTHER): Payer: Self-pay | Admitting: Physician Assistant

## 2018-04-06 ENCOUNTER — Ambulatory Visit (INDEPENDENT_AMBULATORY_CARE_PROVIDER_SITE_OTHER): Payer: Medicare Other

## 2018-04-06 VITALS — Ht 62.0 in | Wt 203.0 lb

## 2018-04-06 DIAGNOSIS — E1142 Type 2 diabetes mellitus with diabetic polyneuropathy: Secondary | ICD-10-CM

## 2018-04-06 DIAGNOSIS — S80211D Abrasion, right knee, subsequent encounter: Secondary | ICD-10-CM | POA: Diagnosis not present

## 2018-04-06 DIAGNOSIS — S42411A Displaced simple supracondylar fracture without intercondylar fracture of right humerus, initial encounter for closed fracture: Secondary | ICD-10-CM | POA: Diagnosis not present

## 2018-04-06 DIAGNOSIS — J449 Chronic obstructive pulmonary disease, unspecified: Secondary | ICD-10-CM

## 2018-04-06 DIAGNOSIS — F17210 Nicotine dependence, cigarettes, uncomplicated: Secondary | ICD-10-CM | POA: Diagnosis not present

## 2018-04-06 DIAGNOSIS — J8410 Pulmonary fibrosis, unspecified: Secondary | ICD-10-CM | POA: Diagnosis not present

## 2018-04-06 DIAGNOSIS — E785 Hyperlipidemia, unspecified: Secondary | ICD-10-CM

## 2018-04-06 DIAGNOSIS — I87323 Chronic venous hypertension (idiopathic) with inflammation of bilateral lower extremity: Secondary | ICD-10-CM

## 2018-04-06 DIAGNOSIS — S42291D Other displaced fracture of upper end of right humerus, subsequent encounter for fracture with routine healing: Secondary | ICD-10-CM | POA: Diagnosis not present

## 2018-04-06 DIAGNOSIS — F251 Schizoaffective disorder, depressive type: Secondary | ICD-10-CM | POA: Diagnosis not present

## 2018-04-06 MED ORDER — OXYCODONE-ACETAMINOPHEN 5-325 MG PO TABS: 1.0000 | ORAL_TABLET | Freq: Four times a day (QID) | ORAL | 0 refills | Status: DC | PRN

## 2018-04-06 NOTE — Progress Notes (Signed)
Office Visit Note   Patient: [name]           Date of Birth: 11-16-1945           MRN: [id number] Visit Date: 04/06/2018              Requested by: Kristen Loader, [place], [place] [address] PCP: Kristen Loader, FNP  Chief Complaint  Patient presents with  . Right Shoulder - Follow-up    DOI 02/27/2018 s/p right proximal humerus fracture.       HPI: The patient is a 73 year old female who comes 10 by a scat bus today for follow-up of her right proximal humerus fracture which occurred on 02/27/2018.  She presents to the clinic saying she is feeling a little dizzy shaky and disoriented.  We did check her oxygen saturation on room air and it was 87% and she states that she did not currently have a portable oxygen tank.  She was placed on portable oxygen at 2 L/min by nasal cannula and her oxygen saturation improved to 95%.  We also checked her capillary blood glucose and this was 61 and she was given a glucose tablet to ingest.  She was much improved following all these interventions.  With regards to her shoulder she has continue to wear sling.  She has been working on pendulum exercises but reports it hurts and she is not been letting the therapist work much with her according to her son.  Assessment & Plan: Visit Diagnoses:  1. Closed supracondylar fracture of right humerus, initial encounter   2. Diabetic polyneuropathy associated with type 2 diabetes mellitus ([place])   3. Idiopathic chronic venous hypertension of both lower extremities with inflammation   4. Postinflammatory pulmonary fibrosis ([facility])   5. COPD mixed type [facility])     Plan: [facility] to work on strengthening and range of motion to tolerance.  She will need to follow-up here again in 4 weeks.  She can wear her shoulder sling as needed for comfort.  She will need follow-up radiographs at her week visit in 4 weeks.  Follow-Up Instructions: Return in about 4  weeks (around 05/04/2018).   Ortho Exam  Patient is alert, oriented, no adenopathy, well-dressed, normal affect, normal respiratory effort. The patient initially reported feeling poorly and reported that she was unable to bring in her portable oxygen tank as she did not currently have one available.  Her oxygen saturation on room air was 87% and she was placed on oxygen by nasal cannula at 2 L/min with a improvement in her oxygen level at 95%.  Her capillary blood glucose call us was also checked and was 61 and she was given a glucose tablet to ingest.  She was feeling much better following all of these interventions. Examination of the shoulder does show separation about the humerus with the humerus distracted distally when out of the sling.  She does have good motion at the elbow and wrist but complains of movement particularly abduction and forward flexion of the shoulder.  She is neurovascularly intact distally.  Imaging: Xr Shoulder Right  Result Date: 04/06/2018 Radiographs of the right shoulder show further collapse of the proximal humeral head fracture.  No images are attached to the encounter.  Labs: Lab Results  Component Value Date   HGBA1C 7.8 (H) 10/29/2016   HGBA1C 9.9 (H) 07/10/2007   HGBA1C 10.7 (H) 03/16/2007   ESRSEDRATE 45 (  H) 05/11/2007     Lab Results  Component Value Date   ALBUMIN 4.1 01/22/2016   ALBUMIN 4.3 10/06/2009   ALBUMIN 4.0 07/10/2007    Body mass index is 37.13 kg/m.  Orders:  Orders Placed This Encounter  Procedures  . XR Shoulder Right   Meds ordered this encounter  Medications  . oxyCODONE-acetaminophen (PERCOCET/ROXICET) 5-325 MG tablet    Sig: Take 1 tablet by mouth every 6 (six) hours as needed for moderate pain or severe pain.    Dispense:  28 tablet    Refill:  0     Procedures: No procedures performed  Clinical Data: No additional findings.  ROS:  All other systems negative, except as noted in the HPI. Review of Systems   Objective: Vital Signs: Ht 5\' 2"  (1.575 m)   Wt 203 lb (92.1 kg)   BMI 37.13 kg/m   Specialty Comments:  No specialty comments available.  PMFS History: Patient Active Problem List   Diagnosis Date Noted  . Idiopathic chronic venous hypertension of both lower extremities with inflammation 06/07/2017  . Ulcer of toe of left foot, limited to breakdown of skin ([place]) 06/07/2017  . History of amputation of lesser toe of right foot ([place]) 11/05/2016  . Diabetic polyneuropathy associated with type 2 diabetes mellitus ([place]) 10/05/2016  . Fracture of one rib, left side, initial encounter for closed fracture 01/22/2016  . Postinflammatory pulmonary fibrosis ([place]) 09/21/2015  . COPD mixed type ([place]) 09/18/2015  . FREQUENCY, URINARY 07/10/2007  . 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 Smoker 02/23/2007  . Essential hypertension 02/23/2007  . DIAB W/O MENTION COMP TYPE II/UNS TYPE UNCNTRL 01/20/2007  . HYPERLIPIDEMIA 01/20/2007  . HEADACHE 01/20/2007   Past Medical History:  Diagnosis Date  . Anxiety   . Arthritis   . Cataract   . COPD (chronic obstructive pulmonary disease) ([place])   . Depression   . Diabetes mellitus without complication ([place])   . Glaucoma   . Headache    "migraines im my 20's"  . Hyperlipidemia   . Hypertension   . Neuromuscular disorder ([place])   . Osteomyelitis ([place])    right second toe  . Osteoporosis   . Schizophrenia ([place])    schizoaffective  . Substance abuse ([place])     Family History  Problem Relation Age of Onset  . Heart disease Father     Past Surgical History:  Procedure Laterality Date  . AMPUTATION Right 10/29/2016   Procedure: Right Foot 2nd Toe Amputation at Metatarsophalangeal Joint;  Surgeon: Newt Minion, MD;  Location: [facility];  Service: Orthopedics;  Laterality: Right;  . EYE SURGERY    . KNEE SURGERY    . MULTIPLE TOOTH EXTRACTIONS    . VASCULAR SURGERY     vein surgery   Social History   Occupational History  . Not on file   Tobacco Use  . Smoking status: Current Every Day Smoker    Packs/day: 2.00    Years: 53.00    Pack years: 106.00    Types: Cigarettes  . Smokeless tobacco: Never Used  Substance and Sexual Activity  . Alcohol use: No    Alcohol/week: 0.0 standard drinks  . Drug use: No  . Sexual activity: Not on file

## 2018-04-07 ENCOUNTER — Telehealth (INDEPENDENT_AMBULATORY_CARE_PROVIDER_SITE_OTHER): Payer: Self-pay

## 2018-04-07 ENCOUNTER — Encounter (INDEPENDENT_AMBULATORY_CARE_PROVIDER_SITE_OTHER): Payer: Self-pay | Admitting: Physician Assistant

## 2018-04-07 NOTE — Telephone Encounter (Signed)
Emailed updated rx for physical therapy per Shawn from yesterdays visit. [facility] to work on right shoulder ROM and strengthening exercises to tolerance 2-3 times a week for 4 week.s

## 2018-04-10 ENCOUNTER — Emergency Department (HOSPITAL_COMMUNITY): Payer: Medicare Other

## 2018-04-10 ENCOUNTER — Emergency Department (HOSPITAL_COMMUNITY)
Admission: EM | Admit: 2018-04-10 | Discharge: 2018-04-10 | Disposition: A | Discharge status: Home / Self Care | Payer: Medicare Other | Attending: Emergency Medicine | Admitting: Emergency Medicine

## 2018-04-10 ENCOUNTER — Encounter (HOSPITAL_COMMUNITY): Payer: Self-pay

## 2018-04-10 ENCOUNTER — Other Ambulatory Visit: Payer: Self-pay

## 2018-04-10 DIAGNOSIS — E1142 Type 2 diabetes mellitus with diabetic polyneuropathy: Secondary | ICD-10-CM | POA: Diagnosis not present

## 2018-04-10 DIAGNOSIS — Z7982 Long term (current) use of aspirin: Secondary | ICD-10-CM | POA: Diagnosis not present

## 2018-04-10 DIAGNOSIS — Z794 Long term (current) use of insulin: Secondary | ICD-10-CM | POA: Diagnosis not present

## 2018-04-10 DIAGNOSIS — J449 Chronic obstructive pulmonary disease, unspecified: Secondary | ICD-10-CM | POA: Diagnosis not present

## 2018-04-10 DIAGNOSIS — E119 Type 2 diabetes mellitus without complications: Secondary | ICD-10-CM | POA: Diagnosis not present

## 2018-04-10 DIAGNOSIS — F251 Schizoaffective disorder, depressive type: Secondary | ICD-10-CM | POA: Diagnosis not present

## 2018-04-10 DIAGNOSIS — J181 Lobar pneumonia, unspecified organism: Secondary | ICD-10-CM | POA: Insufficient documentation

## 2018-04-10 DIAGNOSIS — Z9981 Dependence on supplemental oxygen: Secondary | ICD-10-CM | POA: Diagnosis not present

## 2018-04-10 DIAGNOSIS — F17210 Nicotine dependence, cigarettes, uncomplicated: Secondary | ICD-10-CM | POA: Diagnosis not present

## 2018-04-10 DIAGNOSIS — S80211D Abrasion, right knee, subsequent encounter: Secondary | ICD-10-CM | POA: Diagnosis not present

## 2018-04-10 DIAGNOSIS — S42291D Other displaced fracture of upper end of right humerus, subsequent encounter for fracture with routine healing: Secondary | ICD-10-CM | POA: Diagnosis not present

## 2018-04-10 DIAGNOSIS — I10 Essential (primary) hypertension: Secondary | ICD-10-CM | POA: Insufficient documentation

## 2018-04-10 DIAGNOSIS — Z79899 Other long term (current) drug therapy: Secondary | ICD-10-CM | POA: Insufficient documentation

## 2018-04-10 DIAGNOSIS — J189 Pneumonia, unspecified organism: Secondary | ICD-10-CM | POA: Diagnosis not present

## 2018-04-10 DIAGNOSIS — R0602 Shortness of breath: Secondary | ICD-10-CM | POA: Diagnosis not present

## 2018-04-10 LAB — CBC WITH DIFFERENTIAL/PLATELET
Abs Immature Granulocytes: 0.02 10*3/uL (ref 0.00–0.07)
Basophils Absolute: 0.1 10*3/uL (ref 0.0–0.1)
Basophils Relative: 1 %
Eosinophils Absolute: 0.2 10*3/uL (ref 0.0–0.5)
Eosinophils Relative: 2 %
HEMATOCRIT: 46.9 % — AB (ref 36.0–46.0)
Hemoglobin: 14.2 g/dL (ref 12.0–15.0)
IMMATURE GRANULOCYTES: 0 %
LYMPHS ABS: 2.7 10*3/uL (ref 0.7–4.0)
Lymphocytes Relative: 29 %
MCH: 28.5 pg (ref 26.0–34.0)
MCHC: 30.3 g/dL (ref 30.0–36.0)
MCV: 94.2 fL (ref 80.0–100.0)
Monocytes Absolute: 0.7 10*3/uL (ref 0.1–1.0)
Monocytes Relative: 7 %
Neutro Abs: 5.9 10*3/uL (ref 1.7–7.7)
Neutrophils Relative %: 61 %
Platelets: 252 10*3/uL (ref 150–400)
RBC: 4.98 MIL/uL (ref 3.87–5.11)
RDW: 19.4 % — ABNORMAL HIGH (ref 11.5–15.5)
WBC: 9.5 10*3/uL (ref 4.0–10.5)
nRBC: 0 % (ref 0.0–0.2)

## 2018-04-10 LAB — COMPREHENSIVE METABOLIC PANEL
ALT: 19 U/L (ref 0–44)
AST: 22 U/L (ref 15–41)
Albumin: 3.7 g/dL (ref 3.5–5.0)
Alkaline Phosphatase: 77 U/L (ref 38–126)
Anion gap: 8 (ref 5–15)
BUN: 22 mg/dL (ref 8–23)
CO2: 31 mmol/L (ref 22–32)
Calcium: 9.4 mg/dL (ref 8.9–10.3)
Chloride: 101 mmol/L (ref 98–111)
Creatinine, Ser: 0.64 mg/dL (ref 0.44–1.00)
GFR calc Af Amer: >60 mL/min (ref >60)
GFR calc non Af Amer: >60 mL/min (ref >60)
Glucose, Bld: 175 mg/dL — ABNORMAL HIGH (ref 70–99)
POTASSIUM: 4.7 mmol/L (ref 3.5–5.1)
SODIUM: 140 mmol/L (ref 135–145)
Total Bilirubin: 0.3 mg/dL (ref 0.3–1.2)
Total Protein: 7.1 g/dL (ref 6.5–8.1)

## 2018-04-10 LAB — BRAIN NATRIURETIC PEPTIDE: B Natriuretic Peptide: 105.2 pg/mL — ABNORMAL HIGH (ref 0.0–100.0)

## 2018-04-10 MED ORDER — LEVOFLOXACIN IN D5W 750 MG/150ML IV SOLN
750.0000 mg | Freq: Once | INTRAVENOUS | Status: AC
  Administered 2018-04-10: 750 mg via INTRAVENOUS
  Filled 2018-04-10: qty 150

## 2018-04-10 MED ORDER — LEVOFLOXACIN 500 MG PO TABS: 500.0000 mg | ORAL_TABLET | Freq: Every day | ORAL | 0 refills | Status: AC

## 2018-04-10 NOTE — ED Triage Notes (Signed)
Patient reports that her home health nurse reported a temp 99.0 orally and not feeling well.  Home Health nurse told her to come to the ED. Patient has a history of COPD. Patient wears home O2 3L/min via [place]. Patient arrived to the ED with no O@ because she says she did not have a portable O2 tank. Sats in triage 82% on room air and SOB. Patient states her SOB is no worse than usual.

## 2018-04-10 NOTE — ED Provider Notes (Signed)
[facility] DEPT Provider Note   CSN: [id number] Arrival date & time: 04/10/18  1355    History   Chief Complaint Chief Complaint  Patient presents with  . Shortness of Breath    HPI [name] is a 73 y.o. female.     HPI Patient presents with her son who assists with the HPI. Patient has multiple medical issues including COPD, home oxygen dependency, 24/7 and ongoing cigarette addiction. She presents due to concern, largely on the part of the son, today she has had more generalized weakness than usual, possible temperature elevation, maximum 99 earlier today. Patient has ongoing dyspnea, this is seemingly unchanged, has no fever here. Patient notes that she has ongoing generalized weakness, without focality, has no focal pain, was well until today, has no recent medicine change, diet change, activity change. Past Medical History:  Diagnosis Date  . Anxiety   . Arthritis   . Cataract   . COPD (chronic obstructive pulmonary disease) ([place])   . Depression   . Diabetes mellitus without complication ([place])   . Glaucoma   . Headache    "migraines im my 20's"  . Hyperlipidemia   . Hypertension   . Neuromuscular disorder ([place])   . Osteomyelitis ([place])    right second toe  . Osteoporosis   . Schizophrenia ([place])    schizoaffective  . Substance abuse [facility])     Patient Active Problem List   Diagnosis Date Noted  . Idiopathic chronic venous hypertension of both lower extremities with inflammation 06/07/2017  . Ulcer of toe of left foot, limited to breakdown of skin ([place]) 06/07/2017  . History of amputation of lesser toe of right foot ([place]) 11/05/2016  . Diabetic polyneuropathy associated with type 2 diabetes mellitus ([place]) 10/05/2016  . Fracture of one rib, left side, initial encounter for closed fracture 01/22/2016  . 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 COPD mixed type ([place]) 09/18/2015  . FREQUENCY, URINARY 07/10/2007   . 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 Smoker 02/23/2007  . Essential hypertension 02/23/2007  . DIAB W/O MENTION COMP TYPE II/UNS TYPE UNCNTRL 01/20/2007  . HYPERLIPIDEMIA 01/20/2007  . HEADACHE 01/20/2007    Past Surgical History:  Procedure Laterality Date  . AMPUTATION Right 10/29/2016   Procedure: Right Foot 2nd Toe Amputation at Metatarsophalangeal Joint;  Surgeon: Newt Minion, MD;  Location: [place];  Service: Orthopedics;  Laterality: Right;  . EYE SURGERY    . KNEE SURGERY    . MULTIPLE TOOTH EXTRACTIONS    . VASCULAR SURGERY     vein surgery     OB History   No obstetric history on file.      Home Medications    Prior to Admission medications   Medication Sig Start Date End Date Taking? Authorizing Provider  acetaminophen-codeine (TYLENOL #3) 300-30 MG tablet TK 1 T PO Q 6 H FOR 5 DAYS PRN 03/02/18   [provider]  albuterol (PROVENTIL HFA;VENTOLIN HFA) 108 (90 Base) MCG/ACT inhaler Inhale 2 puffs into the lungs every 6 (six) hours as needed for wheezing or shortness of breath.    [provider]  amLODipine (NORVASC) 10 MG tablet Take 10 mg by mouth daily.    [provider]  aspirin EC 81 MG tablet Take 81 mg by mouth daily.    [provider]  atorvastatin (LIPITOR) 20 MG tablet Take 20 mg by mouth daily.    [provider]  BD PEN NEEDLE NANO U/F  32G X 4 MM MISC  12/23/15   [provider]  bimatoprost (LUMIGAN) 0.03 % ophthalmic solution Place 1 drop into both eyes at bedtime.    [provider]  budesonide-formoterol (SYMBICORT) 160-4.5 MCG/ACT inhaler Inhale 2 puffs into the lungs 2 (two) times daily.    [provider]  chlorproMAZINE (THORAZINE) 100 MG tablet Take 100 mg by mouth at bedtime.     [provider]  doxycycline (VIBRAMYCIN) 100 MG capsule Take 1 capsule (100 mg total) by mouth 2 (two) times daily. 10/22/17   Wurst, [place], PA-C  glipiZIDE (GLUCOTROL XL) 10 MG 24  hr tablet Take 10 mg by mouth daily. 01/04/18   [provider]  hydrOXYzine (ATARAX/VISTARIL) 10 MG tablet Take 10 mg by mouth 2 (two) times daily.     [provider]  ibuprofen (ADVIL,MOTRIN) 400 MG tablet Take 1 tablet (400 mg total) by mouth every 6 (six) hours as needed for moderate pain. 10/22/17   Wurst, [place], PA-C  Insulin Glargine (BASAGLAR KWIKPEN) 100 UNIT/ML SOPN Inject 35 Units into the skin at bedtime.     [provider]  metFORMIN (GLUCOPHAGE) 1000 MG tablet Take 1,000 mg by mouth 2 (two) times daily with a meal.     [provider]  metFORMIN (GLUCOPHAGE-XR) 750 MG 24 hr tablet Take 750 mg by mouth daily with breakfast.    [provider]  morphine (MSIR) 15 MG tablet Take 0.5 tablets (7.5 mg total) by mouth every 6 (six) hours as needed for moderate pain or severe pain. 02/27/18   Davonna Belling, MD  NOVOLOG FLEXPEN 100 UNIT/ML FlexPen Inject 7-9 Units into the skin 3 (three) times daily with meals. Take 7 units in the am, 9 units at lunch and 9 units at dinner. 11/15/15   [provider]  oxyCODONE-acetaminophen (PERCOCET/ROXICET) 5-325 MG tablet Take 1 tablet by mouth every 6 (six) hours as needed for moderate pain or severe pain. 04/06/18   Rayburn, Neta Mends, PA-C  pregabalin (LYRICA) 100 MG capsule Take 100 mg by mouth 3 (three) times daily.    [provider]  sertraline (ZOLOFT) 100 MG tablet Take 100 mg by mouth daily.    [provider]  topiramate (TOPAMAX) 50 MG tablet Take 50 mg by mouth daily.  01/03/16   [provider]  valsartan (DIOVAN) 160 MG tablet Take 160 mg by mouth daily.    [provider]  vitamin C (ASCORBIC ACID) 500 MG tablet Take 500 mg by mouth daily as needed (cold prevention).    [provider]    Family History Family History  Problem Relation Age of Onset  . Heart disease Father     Social History Social History   Tobacco Use  .  Smoking status: Current Every Day Smoker    Packs/day: 2.00    Years: 53.00    Pack years: 106.00    Types: Cigarettes  . Smokeless tobacco: Never Used  Substance Use Topics  . Alcohol use: No    Alcohol/week: 0.0 standard drinks  . Drug use: No     Allergies   Sitagliptin phosphate   Review of Systems Review of Systems  Constitutional:       Per HPI, otherwise negative  HENT:       Per HPI, otherwise negative  Respiratory:       Per HPI, otherwise negative  Cardiovascular:       Per HPI, otherwise negative  Gastrointestinal: Negative for vomiting.  Endocrine:       Negative aside from HPI  Genitourinary:       Neg aside from HPI   Musculoskeletal:       Per HPI, otherwise negative  Skin: Negative.   Neurological: Negative for syncope.     Physical Exam Updated Vital Signs BP (!) 163/76   Pulse 67   Temp 98.2 F (36.8 C) (Oral)   Resp 14   Ht 5\' 2"  (1.575 m)   Wt 92.8 kg   SpO2 99%   BMI 37.42 kg/m   Physical Exam Vitals signs and nursing note reviewed.  Constitutional:      Appearance: She is well-developed. She is ill-appearing.     Comments: Ill-appearing obese elderly female awake and alert  HENT:     Head: Normocephalic and atraumatic.  Eyes:     Conjunctiva/sclera: Conjunctivae normal.  Cardiovascular:     Rate and Rhythm: Normal rate and regular rhythm.  Pulmonary:     Effort: Pulmonary effort is normal.     Breath sounds: Decreased breath sounds and wheezing present.  Abdominal:     General: There is no distension.  Skin:    General: Skin is warm and dry.  Neurological:     Mental Status: She is alert and oriented to person, place, and time.     Cranial Nerves: No cranial nerve deficit.      ED Treatments / Results  Labs (all labs ordered are listed, but only abnormal results are displayed) Labs Reviewed  COMPREHENSIVE METABOLIC PANEL - Abnormal; Notable for the following components:      Result Value   Glucose, Bld 175 (*)     All other components within normal limits  CBC WITH DIFFERENTIAL/PLATELET - Abnormal; Notable for the following components:   HCT 46.9 (*)    RDW 19.4 (*)    All other components within normal limits  BRAIN NATRIURETIC PEPTIDE - Abnormal; Notable for the following components:   B Natriuretic Peptide 105.2 (*)    All other components within normal limits    EKG EKG Interpretation  Date/Time:  Monday April 10 2018 14:26:15 EDT Ventricular Rate:  66 PR Interval:    QRS Duration: 97 QT Interval:  428 QTC Calculation: 449 R Axis:   22 Text Interpretation:  Sinus rhythm Probable left ventricular hypertrophy Anterior Q waves, possibly due to LVH ST-t wave abnormality Abnormal ekg Confirmed by [name] [id number]) on 04/10/2018 2:36:47 PM   Radiology Dg Chest 2 View  Result Date: 04/10/2018 CLINICAL DATA:  Shortness of breath EXAM: CHEST - 2 VIEW COMPARISON:  09/18/2015 FINDINGS: There is bilateral mild interstitial thickening likely chronic. There is hazy right lower lobe airspace disease concerning for pneumonia. There is no pleural effusion or pneumothorax. The heart and mediastinal contours are unremarkable. The osseous structures are unremarkable. IMPRESSION: Hazy right lower lobe airspace disease concerning for pneumonia. Followup PA and lateral chest X-ray is recommended in 3-4 weeks following trial of antibiotic therapy to ensure resolution and exclude underlying malignancy. Electronically Signed   By: Kathreen Devoid   On: 04/10/2018 15:40    Procedures Procedures (including critical care time)  Medications Ordered in ED Medications  levofloxacin (LEVAQUIN) IVPB 750 mg (750 mg Intravenous New Bag/Given 04/10/18 1611)     Initial Impression / Assessment and Plan / ED Course  I have reviewed the triage vital signs and the nursing notes.  Pertinent labs & imaging results that were available during my care of the patient were  reviewed by me and considered in my medical decision  making (see chart for details).    Patient was hypoxic on arrival on room air 82%, but due to absence of portable oxygen, according to the patient and family.     4:49 PM Patient awake, alert.  Findings reviewed, most notable for possible right lower lobe pneumonia.  When here patient is awake, alert, no increased work of breathing, no fever Would like conversation about her pneumonia, consideration of admission versus discharge.  On given the current environment with concern for exposure to novel coronavirus, the patient has strong preference for discharge with ongoing outpatient antibiotic therapy. Absent evidence for bacteremia, sepsis, decompensation, though she does have COPD. This was accommodated.  Final Clinical Impressions(s) / ED Diagnoses  Right lower lobe pneumonia   [name], MD 04/10/18 1651

## 2018-04-10 NOTE — Discharge Instructions (Signed)
Please be sure to monitor your condition carefully, take all medication as directed including your new antibiotics. In particular, please monitor your blood sugar levels, and it may require adjustment, after discussion with your physician.  Return here for concerning changes in your condition.

## 2018-04-13 DIAGNOSIS — F17210 Nicotine dependence, cigarettes, uncomplicated: Secondary | ICD-10-CM | POA: Diagnosis not present

## 2018-04-13 DIAGNOSIS — J449 Chronic obstructive pulmonary disease, unspecified: Secondary | ICD-10-CM | POA: Diagnosis not present

## 2018-04-13 DIAGNOSIS — S42291D Other displaced fracture of upper end of right humerus, subsequent encounter for fracture with routine healing: Secondary | ICD-10-CM | POA: Diagnosis not present

## 2018-04-13 DIAGNOSIS — E1142 Type 2 diabetes mellitus with diabetic polyneuropathy: Secondary | ICD-10-CM | POA: Diagnosis not present

## 2018-04-13 DIAGNOSIS — S80211D Abrasion, right knee, subsequent encounter: Secondary | ICD-10-CM | POA: Diagnosis not present

## 2018-04-13 DIAGNOSIS — F251 Schizoaffective disorder, depressive type: Secondary | ICD-10-CM | POA: Diagnosis not present

## 2018-04-17 ENCOUNTER — Telehealth: Payer: Self-pay | Admitting: Dietician

## 2018-04-17 DIAGNOSIS — J449 Chronic obstructive pulmonary disease, unspecified: Secondary | ICD-10-CM | POA: Diagnosis not present

## 2018-04-17 DIAGNOSIS — E1142 Type 2 diabetes mellitus with diabetic polyneuropathy: Secondary | ICD-10-CM | POA: Diagnosis not present

## 2018-04-17 DIAGNOSIS — S80211D Abrasion, right knee, subsequent encounter: Secondary | ICD-10-CM | POA: Diagnosis not present

## 2018-04-17 DIAGNOSIS — F251 Schizoaffective disorder, depressive type: Secondary | ICD-10-CM | POA: Diagnosis not present

## 2018-04-17 DIAGNOSIS — F17210 Nicotine dependence, cigarettes, uncomplicated: Secondary | ICD-10-CM | POA: Diagnosis not present

## 2018-04-17 DIAGNOSIS — S42291D Other displaced fracture of upper end of right humerus, subsequent encounter for fracture with routine healing: Secondary | ICD-10-CM | POA: Diagnosis not present

## 2018-04-17 NOTE — Telephone Encounter (Signed)
Noted that patient was in the ER one week ago with pneumonia. She has a follow up appointment with me tomorrow.   Called patient and she was not available. Left message advising patient to stay home and to call if needed.  [name], RD, LDN, CDE

## 2018-04-18 ENCOUNTER — Ambulatory Visit: Payer: Managed Care, Other (non HMO) | Admitting: Dietician

## 2018-04-18 DIAGNOSIS — J449 Chronic obstructive pulmonary disease, unspecified: Secondary | ICD-10-CM | POA: Diagnosis not present

## 2018-04-18 DIAGNOSIS — S42291D Other displaced fracture of upper end of right humerus, subsequent encounter for fracture with routine healing: Secondary | ICD-10-CM | POA: Diagnosis not present

## 2018-04-18 DIAGNOSIS — S80211D Abrasion, right knee, subsequent encounter: Secondary | ICD-10-CM | POA: Diagnosis not present

## 2018-04-18 DIAGNOSIS — F251 Schizoaffective disorder, depressive type: Secondary | ICD-10-CM | POA: Diagnosis not present

## 2018-04-18 DIAGNOSIS — F17210 Nicotine dependence, cigarettes, uncomplicated: Secondary | ICD-10-CM | POA: Diagnosis not present

## 2018-04-18 DIAGNOSIS — E1142 Type 2 diabetes mellitus with diabetic polyneuropathy: Secondary | ICD-10-CM | POA: Diagnosis not present

## 2018-04-19 DIAGNOSIS — S42291D Other displaced fracture of upper end of right humerus, subsequent encounter for fracture with routine healing: Secondary | ICD-10-CM | POA: Diagnosis not present

## 2018-04-19 DIAGNOSIS — F251 Schizoaffective disorder, depressive type: Secondary | ICD-10-CM | POA: Diagnosis not present

## 2018-04-19 DIAGNOSIS — F17210 Nicotine dependence, cigarettes, uncomplicated: Secondary | ICD-10-CM | POA: Diagnosis not present

## 2018-04-19 DIAGNOSIS — E1142 Type 2 diabetes mellitus with diabetic polyneuropathy: Secondary | ICD-10-CM | POA: Diagnosis not present

## 2018-04-19 DIAGNOSIS — J449 Chronic obstructive pulmonary disease, unspecified: Secondary | ICD-10-CM | POA: Diagnosis not present

## 2018-04-19 DIAGNOSIS — S80211D Abrasion, right knee, subsequent encounter: Secondary | ICD-10-CM | POA: Diagnosis not present

## 2018-04-21 DIAGNOSIS — S80211D Abrasion, right knee, subsequent encounter: Secondary | ICD-10-CM | POA: Diagnosis not present

## 2018-04-21 DIAGNOSIS — F251 Schizoaffective disorder, depressive type: Secondary | ICD-10-CM | POA: Diagnosis not present

## 2018-04-21 DIAGNOSIS — S42291D Other displaced fracture of upper end of right humerus, subsequent encounter for fracture with routine healing: Secondary | ICD-10-CM | POA: Diagnosis not present

## 2018-04-21 DIAGNOSIS — F17210 Nicotine dependence, cigarettes, uncomplicated: Secondary | ICD-10-CM | POA: Diagnosis not present

## 2018-04-21 DIAGNOSIS — E1142 Type 2 diabetes mellitus with diabetic polyneuropathy: Secondary | ICD-10-CM | POA: Diagnosis not present

## 2018-04-21 DIAGNOSIS — J449 Chronic obstructive pulmonary disease, unspecified: Secondary | ICD-10-CM | POA: Diagnosis not present

## 2018-04-24 DIAGNOSIS — F17210 Nicotine dependence, cigarettes, uncomplicated: Secondary | ICD-10-CM | POA: Diagnosis not present

## 2018-04-24 DIAGNOSIS — E1142 Type 2 diabetes mellitus with diabetic polyneuropathy: Secondary | ICD-10-CM | POA: Diagnosis not present

## 2018-04-24 DIAGNOSIS — F251 Schizoaffective disorder, depressive type: Secondary | ICD-10-CM | POA: Diagnosis not present

## 2018-04-24 DIAGNOSIS — J449 Chronic obstructive pulmonary disease, unspecified: Secondary | ICD-10-CM | POA: Diagnosis not present

## 2018-04-24 DIAGNOSIS — S42291D Other displaced fracture of upper end of right humerus, subsequent encounter for fracture with routine healing: Secondary | ICD-10-CM | POA: Diagnosis not present

## 2018-04-24 DIAGNOSIS — S80211D Abrasion, right knee, subsequent encounter: Secondary | ICD-10-CM | POA: Diagnosis not present

## 2018-04-26 DIAGNOSIS — E1142 Type 2 diabetes mellitus with diabetic polyneuropathy: Secondary | ICD-10-CM | POA: Diagnosis not present

## 2018-04-26 DIAGNOSIS — S42291D Other displaced fracture of upper end of right humerus, subsequent encounter for fracture with routine healing: Secondary | ICD-10-CM | POA: Diagnosis not present

## 2018-04-26 DIAGNOSIS — F17210 Nicotine dependence, cigarettes, uncomplicated: Secondary | ICD-10-CM | POA: Diagnosis not present

## 2018-04-26 DIAGNOSIS — J449 Chronic obstructive pulmonary disease, unspecified: Secondary | ICD-10-CM | POA: Diagnosis not present

## 2018-04-26 DIAGNOSIS — S80211D Abrasion, right knee, subsequent encounter: Secondary | ICD-10-CM | POA: Diagnosis not present

## 2018-04-26 DIAGNOSIS — F251 Schizoaffective disorder, depressive type: Secondary | ICD-10-CM | POA: Diagnosis not present

## 2018-04-27 DIAGNOSIS — F251 Schizoaffective disorder, depressive type: Secondary | ICD-10-CM | POA: Diagnosis not present

## 2018-04-27 DIAGNOSIS — F17210 Nicotine dependence, cigarettes, uncomplicated: Secondary | ICD-10-CM | POA: Diagnosis not present

## 2018-04-27 DIAGNOSIS — E1142 Type 2 diabetes mellitus with diabetic polyneuropathy: Secondary | ICD-10-CM | POA: Diagnosis not present

## 2018-04-27 DIAGNOSIS — S42291D Other displaced fracture of upper end of right humerus, subsequent encounter for fracture with routine healing: Secondary | ICD-10-CM | POA: Diagnosis not present

## 2018-04-27 DIAGNOSIS — J449 Chronic obstructive pulmonary disease, unspecified: Secondary | ICD-10-CM | POA: Diagnosis not present

## 2018-04-27 DIAGNOSIS — S80211D Abrasion, right knee, subsequent encounter: Secondary | ICD-10-CM | POA: Diagnosis not present

## 2018-04-29 DIAGNOSIS — S42291D Other displaced fracture of upper end of right humerus, subsequent encounter for fracture with routine healing: Secondary | ICD-10-CM | POA: Diagnosis not present

## 2018-04-29 DIAGNOSIS — M6281 Muscle weakness (generalized): Secondary | ICD-10-CM | POA: Diagnosis not present

## 2018-04-29 DIAGNOSIS — R2689 Other abnormalities of gait and mobility: Secondary | ICD-10-CM | POA: Diagnosis not present

## 2018-04-29 DIAGNOSIS — J449 Chronic obstructive pulmonary disease, unspecified: Secondary | ICD-10-CM | POA: Diagnosis not present

## 2018-04-29 DIAGNOSIS — I10 Essential (primary) hypertension: Secondary | ICD-10-CM | POA: Diagnosis not present

## 2018-04-29 DIAGNOSIS — Z9181 History of falling: Secondary | ICD-10-CM | POA: Diagnosis not present

## 2018-04-29 DIAGNOSIS — F17210 Nicotine dependence, cigarettes, uncomplicated: Secondary | ICD-10-CM | POA: Diagnosis not present

## 2018-04-29 DIAGNOSIS — F251 Schizoaffective disorder, depressive type: Secondary | ICD-10-CM | POA: Diagnosis not present

## 2018-04-29 DIAGNOSIS — Z794 Long term (current) use of insulin: Secondary | ICD-10-CM | POA: Diagnosis not present

## 2018-04-29 DIAGNOSIS — Z6837 Body mass index (BMI) 37.0-37.9, adult: Secondary | ICD-10-CM | POA: Diagnosis not present

## 2018-04-29 DIAGNOSIS — E1142 Type 2 diabetes mellitus with diabetic polyneuropathy: Secondary | ICD-10-CM | POA: Diagnosis not present

## 2018-04-29 DIAGNOSIS — Z9981 Dependence on supplemental oxygen: Secondary | ICD-10-CM | POA: Diagnosis not present

## 2018-05-01 DIAGNOSIS — F251 Schizoaffective disorder, depressive type: Secondary | ICD-10-CM | POA: Diagnosis not present

## 2018-05-01 DIAGNOSIS — E1142 Type 2 diabetes mellitus with diabetic polyneuropathy: Secondary | ICD-10-CM | POA: Diagnosis not present

## 2018-05-01 DIAGNOSIS — S42291D Other displaced fracture of upper end of right humerus, subsequent encounter for fracture with routine healing: Secondary | ICD-10-CM | POA: Diagnosis not present

## 2018-05-01 DIAGNOSIS — Z794 Long term (current) use of insulin: Secondary | ICD-10-CM | POA: Diagnosis not present

## 2018-05-01 DIAGNOSIS — J449 Chronic obstructive pulmonary disease, unspecified: Secondary | ICD-10-CM | POA: Diagnosis not present

## 2018-05-01 DIAGNOSIS — F17210 Nicotine dependence, cigarettes, uncomplicated: Secondary | ICD-10-CM | POA: Diagnosis not present

## 2018-05-02 DIAGNOSIS — F251 Schizoaffective disorder, depressive type: Secondary | ICD-10-CM | POA: Diagnosis not present

## 2018-05-02 DIAGNOSIS — Z794 Long term (current) use of insulin: Secondary | ICD-10-CM | POA: Diagnosis not present

## 2018-05-02 DIAGNOSIS — J449 Chronic obstructive pulmonary disease, unspecified: Secondary | ICD-10-CM | POA: Diagnosis not present

## 2018-05-02 DIAGNOSIS — S42291D Other displaced fracture of upper end of right humerus, subsequent encounter for fracture with routine healing: Secondary | ICD-10-CM | POA: Diagnosis not present

## 2018-05-02 DIAGNOSIS — E1142 Type 2 diabetes mellitus with diabetic polyneuropathy: Secondary | ICD-10-CM | POA: Diagnosis not present

## 2018-05-02 DIAGNOSIS — F17210 Nicotine dependence, cigarettes, uncomplicated: Secondary | ICD-10-CM | POA: Diagnosis not present

## 2018-05-03 ENCOUNTER — Ambulatory Visit (INDEPENDENT_AMBULATORY_CARE_PROVIDER_SITE_OTHER): Payer: Medicare Other | Admitting: Orthopedic Surgery

## 2018-05-03 DIAGNOSIS — Z794 Long term (current) use of insulin: Secondary | ICD-10-CM | POA: Diagnosis not present

## 2018-05-03 DIAGNOSIS — F17200 Nicotine dependence, unspecified, uncomplicated: Secondary | ICD-10-CM | POA: Diagnosis not present

## 2018-05-03 DIAGNOSIS — J449 Chronic obstructive pulmonary disease, unspecified: Secondary | ICD-10-CM | POA: Diagnosis not present

## 2018-05-03 DIAGNOSIS — E1142 Type 2 diabetes mellitus with diabetic polyneuropathy: Secondary | ICD-10-CM | POA: Diagnosis not present

## 2018-05-03 DIAGNOSIS — E782 Mixed hyperlipidemia: Secondary | ICD-10-CM | POA: Diagnosis not present

## 2018-05-03 DIAGNOSIS — I10 Essential (primary) hypertension: Secondary | ICD-10-CM | POA: Diagnosis not present

## 2018-05-04 ENCOUNTER — Ambulatory Visit (INDEPENDENT_AMBULATORY_CARE_PROVIDER_SITE_OTHER): Payer: Medicare Other | Admitting: Orthopedic Surgery

## 2018-05-04 DIAGNOSIS — E1142 Type 2 diabetes mellitus with diabetic polyneuropathy: Secondary | ICD-10-CM | POA: Diagnosis not present

## 2018-05-04 DIAGNOSIS — Z794 Long term (current) use of insulin: Secondary | ICD-10-CM | POA: Diagnosis not present

## 2018-05-04 DIAGNOSIS — J449 Chronic obstructive pulmonary disease, unspecified: Secondary | ICD-10-CM | POA: Diagnosis not present

## 2018-05-04 DIAGNOSIS — F17210 Nicotine dependence, cigarettes, uncomplicated: Secondary | ICD-10-CM | POA: Diagnosis not present

## 2018-05-04 DIAGNOSIS — F251 Schizoaffective disorder, depressive type: Secondary | ICD-10-CM | POA: Diagnosis not present

## 2018-05-04 DIAGNOSIS — S42291D Other displaced fracture of upper end of right humerus, subsequent encounter for fracture with routine healing: Secondary | ICD-10-CM | POA: Diagnosis not present

## 2018-05-05 DIAGNOSIS — Z794 Long term (current) use of insulin: Secondary | ICD-10-CM | POA: Diagnosis not present

## 2018-05-05 DIAGNOSIS — S42291D Other displaced fracture of upper end of right humerus, subsequent encounter for fracture with routine healing: Secondary | ICD-10-CM | POA: Diagnosis not present

## 2018-05-05 DIAGNOSIS — F17210 Nicotine dependence, cigarettes, uncomplicated: Secondary | ICD-10-CM | POA: Diagnosis not present

## 2018-05-05 DIAGNOSIS — J449 Chronic obstructive pulmonary disease, unspecified: Secondary | ICD-10-CM | POA: Diagnosis not present

## 2018-05-05 DIAGNOSIS — F251 Schizoaffective disorder, depressive type: Secondary | ICD-10-CM | POA: Diagnosis not present

## 2018-05-05 DIAGNOSIS — E1142 Type 2 diabetes mellitus with diabetic polyneuropathy: Secondary | ICD-10-CM | POA: Diagnosis not present

## 2018-05-06 DIAGNOSIS — Z794 Long term (current) use of insulin: Secondary | ICD-10-CM | POA: Diagnosis not present

## 2018-05-06 DIAGNOSIS — S42291D Other displaced fracture of upper end of right humerus, subsequent encounter for fracture with routine healing: Secondary | ICD-10-CM | POA: Diagnosis not present

## 2018-05-06 DIAGNOSIS — F251 Schizoaffective disorder, depressive type: Secondary | ICD-10-CM | POA: Diagnosis not present

## 2018-05-06 DIAGNOSIS — F17210 Nicotine dependence, cigarettes, uncomplicated: Secondary | ICD-10-CM | POA: Diagnosis not present

## 2018-05-06 DIAGNOSIS — J449 Chronic obstructive pulmonary disease, unspecified: Secondary | ICD-10-CM | POA: Diagnosis not present

## 2018-05-06 DIAGNOSIS — E1142 Type 2 diabetes mellitus with diabetic polyneuropathy: Secondary | ICD-10-CM | POA: Diagnosis not present

## 2018-05-08 ENCOUNTER — Encounter (INDEPENDENT_AMBULATORY_CARE_PROVIDER_SITE_OTHER): Payer: Self-pay | Admitting: Orthopedic Surgery

## 2018-05-08 ENCOUNTER — Ambulatory Visit (INDEPENDENT_AMBULATORY_CARE_PROVIDER_SITE_OTHER): Payer: Medicare Other | Admitting: Physician Assistant

## 2018-05-08 ENCOUNTER — Ambulatory Visit (INDEPENDENT_AMBULATORY_CARE_PROVIDER_SITE_OTHER): Payer: Medicare Other

## 2018-05-08 ENCOUNTER — Other Ambulatory Visit: Payer: Self-pay

## 2018-05-08 VITALS — Ht 62.0 in | Wt 204.6 lb

## 2018-05-08 DIAGNOSIS — J449 Chronic obstructive pulmonary disease, unspecified: Secondary | ICD-10-CM | POA: Diagnosis not present

## 2018-05-08 DIAGNOSIS — Z794 Long term (current) use of insulin: Secondary | ICD-10-CM | POA: Diagnosis not present

## 2018-05-08 DIAGNOSIS — E1142 Type 2 diabetes mellitus with diabetic polyneuropathy: Secondary | ICD-10-CM | POA: Diagnosis not present

## 2018-05-08 DIAGNOSIS — S42411A Displaced simple supracondylar fracture without intercondylar fracture of right humerus, initial encounter for closed fracture: Secondary | ICD-10-CM

## 2018-05-08 DIAGNOSIS — F17210 Nicotine dependence, cigarettes, uncomplicated: Secondary | ICD-10-CM | POA: Diagnosis not present

## 2018-05-08 DIAGNOSIS — F251 Schizoaffective disorder, depressive type: Secondary | ICD-10-CM | POA: Diagnosis not present

## 2018-05-08 DIAGNOSIS — S42291D Other displaced fracture of upper end of right humerus, subsequent encounter for fracture with routine healing: Secondary | ICD-10-CM | POA: Diagnosis not present

## 2018-05-08 MED ORDER — OXYCODONE-ACETAMINOPHEN 5-325 MG PO TABS: 1.0000 | ORAL_TABLET | Freq: Four times a day (QID) | ORAL | 0 refills | Status: DC | PRN

## 2018-05-08 NOTE — Progress Notes (Signed)
Office Visit Note   Patient: [name]           Date of Birth: 03/26/1945           MRN: [id number] Visit Date: 05/08/2018              Requested by: Kristen Loader, [place], [place] [address] PCP: Kristen Loader, FNP  Chief Complaint  Patient presents with  . Right Shoulder - Pain      HPI: The patient is a 73 year old female here with her son for follow-up of her right proximal humerus fracture which occurred on 02/27/2018.  She has been working with physical therapy at home for strengthening and range of motion and reports that her function is improving some.  She is occasionally requiring pain medication.  Assessment & Plan: Visit Diagnoses:  1. Closed supracondylar fracture of right humerus, initial encounter     Plan: Patient will work on strengthening to try to get the humeral head and better association with the socket.  Continue to work on range of motion.  She may require at some point a reverse total shoulder if further strengthening does not improve her function and motion.  She is can follow-up here in 6 weeks or sooner should she have difficulties in the interim.  She needs follow-up shoulder films at follow-up appointment  Follow-Up Instructions: Return in about 6 weeks (around 06/19/2018).   Ortho Exam  Patient is alert, oriented, no adenopathy, well-dressed, normal affect, normal respiratory effort. Examination of the right shoulder shows separation of the humeral head with the humerus distracted distally.  She has good motion at the elbow wrist and hand and is functionally utilizing these.  She is able to bring the arm up and forward flexion and some in abduction but continues to have limitations with active motion.  She is neurovascularly intact distally.  Imaging: Xr Shoulder Right  Result Date: 05/08/2018 Radiographs of the right shoulder show collapse of the humeral head is stable, there continues to be distraction and the humeral  head below the  Socket.   No images are attached to the encounter.  Labs: Lab Results  Component Value Date   HGBA1C 7.8 (H) 10/29/2016   HGBA1C 9.9 (H) 07/10/2007   HGBA1C 10.7 (H) 03/16/2007   ESRSEDRATE 45 (H) 05/11/2007     Lab Results  Component Value Date   ALBUMIN 3.7 04/10/2018   ALBUMIN 4.1 01/22/2016   ALBUMIN 4.3 10/06/2009    Body mass index is 37.42 kg/m.  Orders:  Orders Placed This Encounter  Procedures  . XR Shoulder Right   Meds ordered this encounter  Medications  . oxyCODONE-acetaminophen (PERCOCET/ROXICET) 5-325 MG tablet    Sig: Take 1 tablet by mouth every 6 (six) hours as needed for moderate pain or severe pain.    Dispense:  28 tablet    Refill:  0     Procedures: No procedures performed  Clinical Data: No additional findings.  ROS:  All other systems negative, except as noted in the HPI. Review of Systems  Objective: Vital Signs: Ht 5\' 2"  (1.575 m)   Wt 204 lb 9.4 oz (92.8 kg)   BMI 37.42 kg/m   Specialty Comments:  No specialty comments available.  PMFS History: Patient Active Problem List   Diagnosis Date Noted  . Idiopathic chronic venous hypertension of both lower extremities with inflammation 06/07/2017  . Ulcer of toe of left foot, limited to breakdown of skin (  [place]) 06/07/2017  . History of amputation of lesser toe of right foot ([place]) 11/05/2016  . Diabetic polyneuropathy associated with type 2 diabetes mellitus ([employer]) 10/05/2016  . Fracture of one rib, left side, initial encounter for closed fracture 01/22/2016  . Postinflammatory pulmonary fibrosis ([place]) 09/21/2015  . COPD mixed type ([place]) 09/18/2015  . FREQUENCY, URINARY 07/10/2007  . 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 Smoker 02/23/2007  . Essential hypertension 02/23/2007  . DIAB W/O MENTION COMP TYPE II/UNS TYPE UNCNTRL 01/20/2007  . HYPERLIPIDEMIA 01/20/2007  . HEADACHE 01/20/2007   Past Medical History:  Diagnosis Date  . Anxiety   . Arthritis   .  Cataract   . COPD (chronic obstructive pulmonary disease) ([place])   . Depression   . Diabetes mellitus without complication ([place])   . Glaucoma   . Headache    "migraines im my 20's"  . Hyperlipidemia   . Hypertension   . Neuromuscular disorder ([place])   . Osteomyelitis ([place])    right second toe  . Osteoporosis   . Schizophrenia ([place])    schizoaffective  . Substance abuse ([place])     Family History  Problem Relation Age of Onset  . Heart disease Father     Past Surgical History:  Procedure Laterality Date  . AMPUTATION Right 10/29/2016   Procedure: Right Foot 2nd Toe Amputation at Metatarsophalangeal Joint;  Surgeon: Newt Minion, MD;  Location: [facility];  Service: Orthopedics;  Laterality: Right;  . EYE SURGERY    . KNEE SURGERY    . MULTIPLE TOOTH EXTRACTIONS    . VASCULAR SURGERY     vein surgery   Social History   Occupational History  . Not on file  Tobacco Use  . Smoking status: Current Every Day Smoker    Packs/day: 2.00    Years: 53.00    Pack years: 106.00    Types: Cigarettes  . Smokeless tobacco: Never Used  Substance and Sexual Activity  . Alcohol use: No    Alcohol/week: 0.0 standard drinks  . Drug use: No  . Sexual activity: Not on file

## 2018-05-09 DIAGNOSIS — J449 Chronic obstructive pulmonary disease, unspecified: Secondary | ICD-10-CM | POA: Diagnosis not present

## 2018-05-09 DIAGNOSIS — Z794 Long term (current) use of insulin: Secondary | ICD-10-CM | POA: Diagnosis not present

## 2018-05-09 DIAGNOSIS — F17210 Nicotine dependence, cigarettes, uncomplicated: Secondary | ICD-10-CM | POA: Diagnosis not present

## 2018-05-09 DIAGNOSIS — S42291D Other displaced fracture of upper end of right humerus, subsequent encounter for fracture with routine healing: Secondary | ICD-10-CM | POA: Diagnosis not present

## 2018-05-09 DIAGNOSIS — E1142 Type 2 diabetes mellitus with diabetic polyneuropathy: Secondary | ICD-10-CM | POA: Diagnosis not present

## 2018-05-09 DIAGNOSIS — F251 Schizoaffective disorder, depressive type: Secondary | ICD-10-CM | POA: Diagnosis not present

## 2018-05-10 DIAGNOSIS — S42291D Other displaced fracture of upper end of right humerus, subsequent encounter for fracture with routine healing: Secondary | ICD-10-CM | POA: Diagnosis not present

## 2018-05-10 DIAGNOSIS — F17210 Nicotine dependence, cigarettes, uncomplicated: Secondary | ICD-10-CM | POA: Diagnosis not present

## 2018-05-10 DIAGNOSIS — E1142 Type 2 diabetes mellitus with diabetic polyneuropathy: Secondary | ICD-10-CM | POA: Diagnosis not present

## 2018-05-10 DIAGNOSIS — Z794 Long term (current) use of insulin: Secondary | ICD-10-CM | POA: Diagnosis not present

## 2018-05-10 DIAGNOSIS — F251 Schizoaffective disorder, depressive type: Secondary | ICD-10-CM | POA: Diagnosis not present

## 2018-05-10 DIAGNOSIS — J449 Chronic obstructive pulmonary disease, unspecified: Secondary | ICD-10-CM | POA: Diagnosis not present

## 2018-05-11 DIAGNOSIS — F251 Schizoaffective disorder, depressive type: Secondary | ICD-10-CM | POA: Diagnosis not present

## 2018-05-11 DIAGNOSIS — S42201A Unspecified fracture of upper end of right humerus, initial encounter for closed fracture: Secondary | ICD-10-CM | POA: Diagnosis not present

## 2018-05-11 DIAGNOSIS — E1142 Type 2 diabetes mellitus with diabetic polyneuropathy: Secondary | ICD-10-CM | POA: Diagnosis not present

## 2018-05-11 DIAGNOSIS — J449 Chronic obstructive pulmonary disease, unspecified: Secondary | ICD-10-CM | POA: Diagnosis not present

## 2018-05-11 DIAGNOSIS — Z794 Long term (current) use of insulin: Secondary | ICD-10-CM | POA: Diagnosis not present

## 2018-05-11 DIAGNOSIS — F17210 Nicotine dependence, cigarettes, uncomplicated: Secondary | ICD-10-CM | POA: Diagnosis not present

## 2018-05-11 DIAGNOSIS — S42291D Other displaced fracture of upper end of right humerus, subsequent encounter for fracture with routine healing: Secondary | ICD-10-CM | POA: Diagnosis not present

## 2018-05-12 DIAGNOSIS — E1142 Type 2 diabetes mellitus with diabetic polyneuropathy: Secondary | ICD-10-CM | POA: Diagnosis not present

## 2018-05-12 DIAGNOSIS — J449 Chronic obstructive pulmonary disease, unspecified: Secondary | ICD-10-CM | POA: Diagnosis not present

## 2018-05-12 DIAGNOSIS — Z794 Long term (current) use of insulin: Secondary | ICD-10-CM | POA: Diagnosis not present

## 2018-05-12 DIAGNOSIS — F17210 Nicotine dependence, cigarettes, uncomplicated: Secondary | ICD-10-CM | POA: Diagnosis not present

## 2018-05-12 DIAGNOSIS — F251 Schizoaffective disorder, depressive type: Secondary | ICD-10-CM | POA: Diagnosis not present

## 2018-05-12 DIAGNOSIS — S42291D Other displaced fracture of upper end of right humerus, subsequent encounter for fracture with routine healing: Secondary | ICD-10-CM | POA: Diagnosis not present

## 2018-05-15 DIAGNOSIS — S42291D Other displaced fracture of upper end of right humerus, subsequent encounter for fracture with routine healing: Secondary | ICD-10-CM | POA: Diagnosis not present

## 2018-05-15 DIAGNOSIS — F251 Schizoaffective disorder, depressive type: Secondary | ICD-10-CM | POA: Diagnosis not present

## 2018-05-15 DIAGNOSIS — F17210 Nicotine dependence, cigarettes, uncomplicated: Secondary | ICD-10-CM | POA: Diagnosis not present

## 2018-05-15 DIAGNOSIS — Z794 Long term (current) use of insulin: Secondary | ICD-10-CM | POA: Diagnosis not present

## 2018-05-15 DIAGNOSIS — J449 Chronic obstructive pulmonary disease, unspecified: Secondary | ICD-10-CM | POA: Diagnosis not present

## 2018-05-15 DIAGNOSIS — E1142 Type 2 diabetes mellitus with diabetic polyneuropathy: Secondary | ICD-10-CM | POA: Diagnosis not present

## 2018-05-16 DIAGNOSIS — F17210 Nicotine dependence, cigarettes, uncomplicated: Secondary | ICD-10-CM | POA: Diagnosis not present

## 2018-05-16 DIAGNOSIS — E1142 Type 2 diabetes mellitus with diabetic polyneuropathy: Secondary | ICD-10-CM | POA: Diagnosis not present

## 2018-05-16 DIAGNOSIS — J449 Chronic obstructive pulmonary disease, unspecified: Secondary | ICD-10-CM | POA: Diagnosis not present

## 2018-05-16 DIAGNOSIS — Z794 Long term (current) use of insulin: Secondary | ICD-10-CM | POA: Diagnosis not present

## 2018-05-16 DIAGNOSIS — S42291D Other displaced fracture of upper end of right humerus, subsequent encounter for fracture with routine healing: Secondary | ICD-10-CM | POA: Diagnosis not present

## 2018-05-16 DIAGNOSIS — F251 Schizoaffective disorder, depressive type: Secondary | ICD-10-CM | POA: Diagnosis not present

## 2018-05-17 DIAGNOSIS — E1142 Type 2 diabetes mellitus with diabetic polyneuropathy: Secondary | ICD-10-CM | POA: Diagnosis not present

## 2018-05-17 DIAGNOSIS — F251 Schizoaffective disorder, depressive type: Secondary | ICD-10-CM | POA: Diagnosis not present

## 2018-05-17 DIAGNOSIS — Z794 Long term (current) use of insulin: Secondary | ICD-10-CM | POA: Diagnosis not present

## 2018-05-17 DIAGNOSIS — F17210 Nicotine dependence, cigarettes, uncomplicated: Secondary | ICD-10-CM | POA: Diagnosis not present

## 2018-05-17 DIAGNOSIS — J449 Chronic obstructive pulmonary disease, unspecified: Secondary | ICD-10-CM | POA: Diagnosis not present

## 2018-05-17 DIAGNOSIS — S42291D Other displaced fracture of upper end of right humerus, subsequent encounter for fracture with routine healing: Secondary | ICD-10-CM | POA: Diagnosis not present

## 2018-05-18 DIAGNOSIS — E1142 Type 2 diabetes mellitus with diabetic polyneuropathy: Secondary | ICD-10-CM | POA: Diagnosis not present

## 2018-05-18 DIAGNOSIS — F251 Schizoaffective disorder, depressive type: Secondary | ICD-10-CM | POA: Diagnosis not present

## 2018-05-18 DIAGNOSIS — S42291D Other displaced fracture of upper end of right humerus, subsequent encounter for fracture with routine healing: Secondary | ICD-10-CM | POA: Diagnosis not present

## 2018-05-18 DIAGNOSIS — F17210 Nicotine dependence, cigarettes, uncomplicated: Secondary | ICD-10-CM | POA: Diagnosis not present

## 2018-05-18 DIAGNOSIS — J449 Chronic obstructive pulmonary disease, unspecified: Secondary | ICD-10-CM | POA: Diagnosis not present

## 2018-05-18 DIAGNOSIS — Z794 Long term (current) use of insulin: Secondary | ICD-10-CM | POA: Diagnosis not present

## 2018-05-19 ENCOUNTER — Ambulatory Visit: Payer: Medicare Other | Admitting: Dietician

## 2018-05-19 DIAGNOSIS — Z20828 Contact with and (suspected) exposure to other viral communicable diseases: Secondary | ICD-10-CM | POA: Diagnosis not present

## 2018-05-19 DIAGNOSIS — Z7189 Other specified counseling: Secondary | ICD-10-CM | POA: Diagnosis not present

## 2018-05-19 DIAGNOSIS — R509 Fever, unspecified: Secondary | ICD-10-CM | POA: Diagnosis not present

## 2018-05-19 DIAGNOSIS — R52 Pain, unspecified: Secondary | ICD-10-CM | POA: Diagnosis not present

## 2018-05-22 DIAGNOSIS — R509 Fever, unspecified: Secondary | ICD-10-CM | POA: Diagnosis not present

## 2018-05-22 DIAGNOSIS — Z20828 Contact with and (suspected) exposure to other viral communicable diseases: Secondary | ICD-10-CM | POA: Diagnosis not present

## 2018-05-22 DIAGNOSIS — R52 Pain, unspecified: Secondary | ICD-10-CM | POA: Diagnosis not present

## 2018-05-23 DIAGNOSIS — S42291D Other displaced fracture of upper end of right humerus, subsequent encounter for fracture with routine healing: Secondary | ICD-10-CM | POA: Diagnosis not present

## 2018-05-23 DIAGNOSIS — Z794 Long term (current) use of insulin: Secondary | ICD-10-CM | POA: Diagnosis not present

## 2018-05-23 DIAGNOSIS — F17210 Nicotine dependence, cigarettes, uncomplicated: Secondary | ICD-10-CM | POA: Diagnosis not present

## 2018-05-23 DIAGNOSIS — E1142 Type 2 diabetes mellitus with diabetic polyneuropathy: Secondary | ICD-10-CM | POA: Diagnosis not present

## 2018-05-23 DIAGNOSIS — J449 Chronic obstructive pulmonary disease, unspecified: Secondary | ICD-10-CM | POA: Diagnosis not present

## 2018-05-23 DIAGNOSIS — F251 Schizoaffective disorder, depressive type: Secondary | ICD-10-CM | POA: Diagnosis not present

## 2018-05-25 DIAGNOSIS — S42291D Other displaced fracture of upper end of right humerus, subsequent encounter for fracture with routine healing: Secondary | ICD-10-CM | POA: Diagnosis not present

## 2018-05-25 DIAGNOSIS — J449 Chronic obstructive pulmonary disease, unspecified: Secondary | ICD-10-CM | POA: Diagnosis not present

## 2018-05-25 DIAGNOSIS — E1142 Type 2 diabetes mellitus with diabetic polyneuropathy: Secondary | ICD-10-CM | POA: Diagnosis not present

## 2018-05-25 DIAGNOSIS — F251 Schizoaffective disorder, depressive type: Secondary | ICD-10-CM | POA: Diagnosis not present

## 2018-05-25 DIAGNOSIS — Z794 Long term (current) use of insulin: Secondary | ICD-10-CM | POA: Diagnosis not present

## 2018-05-25 DIAGNOSIS — F17210 Nicotine dependence, cigarettes, uncomplicated: Secondary | ICD-10-CM | POA: Diagnosis not present

## 2018-05-29 DIAGNOSIS — F251 Schizoaffective disorder, depressive type: Secondary | ICD-10-CM | POA: Diagnosis not present

## 2018-05-29 DIAGNOSIS — Z794 Long term (current) use of insulin: Secondary | ICD-10-CM | POA: Diagnosis not present

## 2018-05-29 DIAGNOSIS — Z9981 Dependence on supplemental oxygen: Secondary | ICD-10-CM | POA: Diagnosis not present

## 2018-05-29 DIAGNOSIS — J449 Chronic obstructive pulmonary disease, unspecified: Secondary | ICD-10-CM | POA: Diagnosis not present

## 2018-05-29 DIAGNOSIS — E1142 Type 2 diabetes mellitus with diabetic polyneuropathy: Secondary | ICD-10-CM | POA: Diagnosis not present

## 2018-05-29 DIAGNOSIS — S42201A Unspecified fracture of upper end of right humerus, initial encounter for closed fracture: Secondary | ICD-10-CM | POA: Diagnosis not present

## 2018-05-29 DIAGNOSIS — R2689 Other abnormalities of gait and mobility: Secondary | ICD-10-CM | POA: Diagnosis not present

## 2018-05-29 DIAGNOSIS — S42291D Other displaced fracture of upper end of right humerus, subsequent encounter for fracture with routine healing: Secondary | ICD-10-CM | POA: Diagnosis not present

## 2018-05-29 DIAGNOSIS — Z9181 History of falling: Secondary | ICD-10-CM | POA: Diagnosis not present

## 2018-05-29 DIAGNOSIS — F17210 Nicotine dependence, cigarettes, uncomplicated: Secondary | ICD-10-CM | POA: Diagnosis not present

## 2018-05-29 DIAGNOSIS — M6281 Muscle weakness (generalized): Secondary | ICD-10-CM | POA: Diagnosis not present

## 2018-05-29 DIAGNOSIS — Z6837 Body mass index (BMI) 37.0-37.9, adult: Secondary | ICD-10-CM | POA: Diagnosis not present

## 2018-05-29 DIAGNOSIS — I10 Essential (primary) hypertension: Secondary | ICD-10-CM | POA: Diagnosis not present

## 2018-05-31 DIAGNOSIS — F251 Schizoaffective disorder, depressive type: Secondary | ICD-10-CM | POA: Diagnosis not present

## 2018-05-31 DIAGNOSIS — S42291D Other displaced fracture of upper end of right humerus, subsequent encounter for fracture with routine healing: Secondary | ICD-10-CM | POA: Diagnosis not present

## 2018-05-31 DIAGNOSIS — F17210 Nicotine dependence, cigarettes, uncomplicated: Secondary | ICD-10-CM | POA: Diagnosis not present

## 2018-05-31 DIAGNOSIS — E1142 Type 2 diabetes mellitus with diabetic polyneuropathy: Secondary | ICD-10-CM | POA: Diagnosis not present

## 2018-05-31 DIAGNOSIS — Z794 Long term (current) use of insulin: Secondary | ICD-10-CM | POA: Diagnosis not present

## 2018-05-31 DIAGNOSIS — J449 Chronic obstructive pulmonary disease, unspecified: Secondary | ICD-10-CM | POA: Diagnosis not present

## 2018-06-02 DIAGNOSIS — F251 Schizoaffective disorder, depressive type: Secondary | ICD-10-CM | POA: Diagnosis not present

## 2018-06-02 DIAGNOSIS — E1142 Type 2 diabetes mellitus with diabetic polyneuropathy: Secondary | ICD-10-CM | POA: Diagnosis not present

## 2018-06-02 DIAGNOSIS — F17210 Nicotine dependence, cigarettes, uncomplicated: Secondary | ICD-10-CM | POA: Diagnosis not present

## 2018-06-02 DIAGNOSIS — S42291D Other displaced fracture of upper end of right humerus, subsequent encounter for fracture with routine healing: Secondary | ICD-10-CM | POA: Diagnosis not present

## 2018-06-02 DIAGNOSIS — Z794 Long term (current) use of insulin: Secondary | ICD-10-CM | POA: Diagnosis not present

## 2018-06-02 DIAGNOSIS — J449 Chronic obstructive pulmonary disease, unspecified: Secondary | ICD-10-CM | POA: Diagnosis not present

## 2018-06-07 DIAGNOSIS — F251 Schizoaffective disorder, depressive type: Secondary | ICD-10-CM | POA: Diagnosis not present

## 2018-06-07 DIAGNOSIS — Z794 Long term (current) use of insulin: Secondary | ICD-10-CM | POA: Diagnosis not present

## 2018-06-07 DIAGNOSIS — S42291D Other displaced fracture of upper end of right humerus, subsequent encounter for fracture with routine healing: Secondary | ICD-10-CM | POA: Diagnosis not present

## 2018-06-07 DIAGNOSIS — J449 Chronic obstructive pulmonary disease, unspecified: Secondary | ICD-10-CM | POA: Diagnosis not present

## 2018-06-07 DIAGNOSIS — E1142 Type 2 diabetes mellitus with diabetic polyneuropathy: Secondary | ICD-10-CM | POA: Diagnosis not present

## 2018-06-07 DIAGNOSIS — F17210 Nicotine dependence, cigarettes, uncomplicated: Secondary | ICD-10-CM | POA: Diagnosis not present

## 2018-06-13 DIAGNOSIS — R1010 Upper abdominal pain, unspecified: Secondary | ICD-10-CM | POA: Diagnosis not present

## 2018-06-16 ENCOUNTER — Ambulatory Visit: Payer: Managed Care, Other (non HMO) | Admitting: Dietician

## 2018-06-20 DIAGNOSIS — F259 Schizoaffective disorder, unspecified: Secondary | ICD-10-CM | POA: Diagnosis not present

## 2018-06-20 DIAGNOSIS — F331 Major depressive disorder, recurrent, moderate: Secondary | ICD-10-CM | POA: Diagnosis not present

## 2018-06-20 DIAGNOSIS — F419 Anxiety disorder, unspecified: Secondary | ICD-10-CM | POA: Diagnosis not present

## 2018-06-21 ENCOUNTER — Ambulatory Visit (INDEPENDENT_AMBULATORY_CARE_PROVIDER_SITE_OTHER): Payer: Medicare Other

## 2018-06-21 ENCOUNTER — Ambulatory Visit (INDEPENDENT_AMBULATORY_CARE_PROVIDER_SITE_OTHER): Payer: Medicare Other | Admitting: Orthopedic Surgery

## 2018-06-21 ENCOUNTER — Encounter: Payer: Self-pay | Admitting: Orthopedic Surgery

## 2018-06-21 ENCOUNTER — Other Ambulatory Visit: Payer: Self-pay

## 2018-06-21 VITALS — Ht 62.0 in | Wt 204.6 lb

## 2018-06-21 DIAGNOSIS — S42411P Displaced simple supracondylar fracture without intercondylar fracture of right humerus, subsequent encounter for fracture with malunion: Secondary | ICD-10-CM

## 2018-06-21 MED ORDER — OXYCODONE-ACETAMINOPHEN 5-325 MG PO TABS: 1.0000 | ORAL_TABLET | Freq: Three times a day (TID) | ORAL | 0 refills | Status: DC | PRN

## 2018-06-21 NOTE — Progress Notes (Signed)
Office Visit Note   Patient: [name]           Date of Birth: 09-27-1945           MRN: [id number] Visit Date: 06/21/2018              Requested by: Kristen Loader, [address] [place], [place] [address] PCP: Kristen Loader, FNP  Chief Complaint  Patient presents with  . Right Shoulder - Follow-up      HPI: The patient is a 73 year old female here with her son for follow-up of her right proximal humerus fracture which occurred on 02/27/2018.  She has been working on rom at home as well as strengthening. She reports that her function is improving some.  She is occasionally requiring pain medication. Requests refill.  Family who accompanies reports some improvement rom.   Assessment & Plan: Visit Diagnoses:  1. Closed supracondylar fracture of right humerus with malunion, subsequent encounter     Plan: Patient will work on strengthening to try to get the humeral head and better association with the socket.  Continue to work on range of motion.   She may require at some point a reverse total shoulder if further strengthening does not improve her function and motion.  She can follow-up here in 6 weeks or sooner should she have difficulties in the interim.   Discussed radiographs with Dr. Sharol Given. He is agreement with treatment plan, non operative intervention at this time.   Follow-Up Instructions: Return in about 6 weeks (around 08/03/2018).   Ortho Exam  Patient is alert, oriented, no adenopathy, well-dressed, normal affect, normal respiratory effort. Examination of the right shoulder   She has good motion at the elbow wrist and hand and is functionally utilizing these.  She is able to raise her arm, has some  forward flexion and some in abduction but continues to have limitations with active motion.  She is neurovascularly intact distally. Pain with passive range of motion of shoulder.  Imaging: Xr Shoulder Right  Result Date: 06/21/2018 Radiographs of the right  shoulder show further collapse of the humeral head. Some reduction of humeral head into socket since last viewing.   No images are attached to the encounter.  Labs: Lab Results  Component Value Date   HGBA1C 7.8 (H) 10/29/2016   HGBA1C 9.9 (H) 07/10/2007   HGBA1C 10.7 (H) 03/16/2007   ESRSEDRATE 45 (H) 05/11/2007     Lab Results  Component Value Date   ALBUMIN 3.7 04/10/2018   ALBUMIN 4.1 01/22/2016   ALBUMIN 4.3 10/06/2009    Body mass index is 37.42 kg/m.  Orders:  Orders Placed This Encounter  Procedures  . XR Shoulder Right   Meds ordered this encounter  Medications  . oxyCODONE-acetaminophen (PERCOCET/ROXICET) 5-325 MG tablet    Sig: Take 1 tablet by mouth every 8 (eight) hours as needed for moderate pain or severe pain.    Dispense:  21 tablet    Refill:  0     Procedures: No procedures performed  Clinical Data: No additional findings.  ROS:  All other systems negative, except as noted in the HPI. Review of Systems  Constitutional: Negative for chills and fever.  Musculoskeletal: Positive for arthralgias and myalgias. Negative for joint swelling.    Objective: Vital Signs: Ht 5\' 2"  (1.575 m)   Wt 204 lb 9.4 oz (92.8 kg)   BMI 37.42 kg/m   Specialty Comments:  No specialty comments available.  [place]  History: Patient Active Problem List   Diagnosis Date Noted  . Idiopathic chronic venous hypertension of both lower extremities with inflammation 06/07/2017  . Ulcer of toe of left foot, limited to breakdown of skin ([place]) 06/07/2017  . History of amputation of lesser toe of right foot ([place]) 11/05/2016  . Diabetic polyneuropathy associated with type 2 diabetes mellitus ([place]) 10/05/2016  . Fracture of one rib, left side, initial encounter for closed fracture 01/22/2016  . Postinflammatory pulmonary fibrosis ([place]) 09/21/2015  . COPD mixed type ([place]) 09/18/2015  . FREQUENCY, URINARY 07/10/2007  . 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 Smoker  02/23/2007  . Essential hypertension 02/23/2007  . DIAB W/O MENTION COMP TYPE II/UNS TYPE UNCNTRL 01/20/2007  . HYPERLIPIDEMIA 01/20/2007  . HEADACHE 01/20/2007   Past Medical History:  Diagnosis Date  . Anxiety   . Arthritis   . Cataract   . COPD (chronic obstructive pulmonary disease) ([place])   . Depression   . Diabetes mellitus without complication ([place])   . Glaucoma   . Headache    "migraines im my 20's"  . Hyperlipidemia   . Hypertension   . Neuromuscular disorder ([place])   . Osteomyelitis ([place])    right second toe  . Osteoporosis   . Schizophrenia ([place])    schizoaffective  . Substance abuse ([place])     Family History  Problem Relation Age of Onset  . Heart disease Father     Past Surgical History:  Procedure Laterality Date  . AMPUTATION Right 10/29/2016   Procedure: Right Foot 2nd Toe Amputation at Metatarsophalangeal Joint;  Surgeon: Newt Minion, MD;  Location: [facility];  Service: Orthopedics;  Laterality: Right;  . EYE SURGERY    . KNEE SURGERY    . MULTIPLE TOOTH EXTRACTIONS    . VASCULAR SURGERY     vein surgery   Social History   Occupational History  . Not on file  Tobacco Use  . Smoking status: Current Every Day Smoker    Packs/day: 2.00    Years: 53.00    Pack years: 106.00    Types: Cigarettes  . Smokeless tobacco: Never Used  Substance and Sexual Activity  . Alcohol use: No    Alcohol/week: 0.0 standard drinks  . Drug use: No  . Sexual activity: Not on file

## 2018-06-22 DIAGNOSIS — Z794 Long term (current) use of insulin: Secondary | ICD-10-CM | POA: Diagnosis not present

## 2018-06-22 DIAGNOSIS — F251 Schizoaffective disorder, depressive type: Secondary | ICD-10-CM | POA: Diagnosis not present

## 2018-06-22 DIAGNOSIS — S42291D Other displaced fracture of upper end of right humerus, subsequent encounter for fracture with routine healing: Secondary | ICD-10-CM | POA: Diagnosis not present

## 2018-06-22 DIAGNOSIS — F17210 Nicotine dependence, cigarettes, uncomplicated: Secondary | ICD-10-CM | POA: Diagnosis not present

## 2018-06-22 DIAGNOSIS — E1142 Type 2 diabetes mellitus with diabetic polyneuropathy: Secondary | ICD-10-CM | POA: Diagnosis not present

## 2018-06-22 DIAGNOSIS — J449 Chronic obstructive pulmonary disease, unspecified: Secondary | ICD-10-CM | POA: Diagnosis not present

## 2018-06-25 ENCOUNTER — Telehealth: Payer: Self-pay

## 2018-06-25 ENCOUNTER — Telehealth: Payer: Self-pay | Admitting: Hematology

## 2018-06-25 NOTE — Telephone Encounter (Signed)
Unable to contact patient re: covid testing d/t potential exposure in [facility]. PEC number and hours provided. UTA s/sx of covid following potential exposure.

## 2018-06-25 NOTE — Telephone Encounter (Signed)
Patient returned called for possible exposure / Patient states she had a test back in March. / I explained that she may need a new test since this incident happened in May. / Patient states she will monitor for symptoms and call back if needed.

## 2018-06-26 ENCOUNTER — Telehealth: Payer: Self-pay | Admitting: Internal Medicine

## 2018-06-26 DIAGNOSIS — Z20822 Contact with and (suspected) exposure to covid-19: Secondary | ICD-10-CM

## 2018-06-26 NOTE — Telephone Encounter (Signed)
Pt called back and decided that she wanted to be tested for covid-19. She is not having any symptoms. Pt advised that it will be June 2 nd at 2:30 and it is a drive thru and she should wear a mask and stay in the car with the windows rolled up until ready to be tested. Pt voiced understanding.

## 2018-06-27 ENCOUNTER — Other Ambulatory Visit: Payer: Managed Care, Other (non HMO)

## 2018-06-27 DIAGNOSIS — Z20822 Contact with and (suspected) exposure to covid-19: Secondary | ICD-10-CM

## 2018-06-27 DIAGNOSIS — R6889 Other general symptoms and signs: Secondary | ICD-10-CM | POA: Diagnosis not present

## 2018-06-28 LAB — NOVEL CORONAVIRUS, NAA: SARS-CoV-2, NAA: NOT DETECTED

## 2018-06-29 ENCOUNTER — Telehealth: Payer: Self-pay | Admitting: Family Medicine

## 2018-06-29 NOTE — Telephone Encounter (Signed)
Pt calling for covid results.  Results negative, reviewed with pt.

## 2018-08-02 ENCOUNTER — Encounter: Payer: Self-pay | Admitting: Family

## 2018-08-02 ENCOUNTER — Ambulatory Visit: Payer: Managed Care, Other (non HMO) | Admitting: Orthopedic Surgery

## 2018-08-02 ENCOUNTER — Other Ambulatory Visit: Payer: Self-pay

## 2018-08-02 ENCOUNTER — Ambulatory Visit (INDEPENDENT_AMBULATORY_CARE_PROVIDER_SITE_OTHER): Payer: Medicare Other | Admitting: Family

## 2018-08-02 VITALS — Ht 62.0 in | Wt 204.6 lb

## 2018-08-02 DIAGNOSIS — S42411P Displaced simple supracondylar fracture without intercondylar fracture of right humerus, subsequent encounter for fracture with malunion: Secondary | ICD-10-CM

## 2018-08-02 DIAGNOSIS — M25511 Pain in right shoulder: Secondary | ICD-10-CM | POA: Diagnosis not present

## 2018-08-02 DIAGNOSIS — G8929 Other chronic pain: Secondary | ICD-10-CM | POA: Diagnosis not present

## 2018-08-02 MED ORDER — TRAMADOL HCL 50 MG PO TABS: 50.0000 mg | ORAL_TABLET | Freq: Two times a day (BID) | ORAL | 0 refills | Status: DC | PRN

## 2018-08-02 NOTE — Progress Notes (Signed)
Office Visit Note   Patient: [name]           Date of Birth: [birth year]           MRN: [id number] Visit Date: 08/02/2018              Requested by: Kristen Loader, [address],  [place] [id number] PCP: Kristen Loader, FNP  Chief Complaint  Patient presents with  . Right Shoulder - Follow-up      HPI: The patient is a 73 year old female here with her son for follow-up of her right proximal humerus fracture which occurred on 02/27/2018.  She has been working on rom at home as well as strengthening. She reports that her function continues to improve.  She is occasionally requiring pain medication.  Requesting a refill of something that is not as strong as her Percocet.  Overall is feeling much better. Assessment & Plan: Visit Diagnoses:  1. Closed supracondylar fracture of right humerus with malunion, subsequent encounter   2. Chronic right shoulder pain     Plan: Pleased with her progress.  She will follow-up in the office as needed.  Follow-Up Instructions: Return in about 2 months (around 10/03/2018), or if symptoms worsen or fail to improve.   Ortho Exam  Patient is alert, oriented, no adenopathy, well-dressed, normal affect, normal respiratory effort. Examination of the right shoulder   She has good motion at the elbow wrist and hand and is functionally utilizing these.  She is able to raise her arm, has some  forward flexion and some in abduction but continues to have limitations with active motion.  She is neurovascularly intact distally.  She is carrying her oxygen tank in her right hand upon exiting the visit. Imaging: No results found. No images are attached to the encounter.  Labs: Lab Results  Component Value Date   HGBA1C 7.8 (H) 10/29/2016   HGBA1C 9.9 (H) 07/10/2007   HGBA1C 10.7 (H) 03/16/2007   ESRSEDRATE 45 (H) 05/11/2007     Lab Results  Component Value Date   ALBUMIN 3.7 04/10/2018   ALBUMIN 4.1 01/22/2016   ALBUMIN 4.3  10/06/2009    Body mass index is 37.42 kg/m.  Orders:  No orders of the defined types were placed in this encounter.  Meds ordered this encounter  Medications  . traMADol (ULTRAM) 50 MG tablet    Sig: Take 1 tablet (50 mg total) by mouth every 12 (twelve) hours as needed for moderate pain.    Dispense:  14 tablet    Refill:  0     Procedures: No procedures performed  Clinical Data: No additional findings.  ROS:  All other systems negative, except as noted in the HPI. Review of Systems  Constitutional: Negative for chills and fever.  Musculoskeletal: Positive for arthralgias and myalgias. Negative for joint swelling.    Objective: Vital Signs: Ht 5\' 2"  (1.575 m)   Wt 204 lb 9.4 oz (92.8 kg)   BMI 37.42 kg/m   Specialty Comments:  No specialty comments available.  PMFS History: Patient Active Problem List   Diagnosis Date Noted  . Pain in right shoulder 08/02/2018  . Idiopathic chronic venous hypertension of both lower extremities with inflammation 06/07/2017  . Ulcer of toe of left foot, limited to breakdown of skin ([place]) 06/07/2017  . History of amputation of lesser toe of right foot ([place]) 11/05/2016  . Diabetic polyneuropathy associated with type 2 diabetes mellitus ([place]) 10/05/2016  .  Fracture of one rib, left side, initial encounter for closed fracture 01/22/2016  . Postinflammatory pulmonary fibrosis ([place]) 09/21/2015  . COPD mixed type ([place]) 09/18/2015  . FREQUENCY, URINARY 07/10/2007  . 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 Smoker 02/23/2007  . Essential hypertension 02/23/2007  . DIAB W/O MENTION COMP TYPE II/UNS TYPE UNCNTRL 01/20/2007  . HYPERLIPIDEMIA 01/20/2007  . HEADACHE 01/20/2007   Past Medical History:  Diagnosis Date  . Anxiety   . Arthritis   . Cataract   . COPD (chronic obstructive pulmonary disease) ([place])   . Depression   . Diabetes mellitus without complication ([place])   . Glaucoma   . Headache    "migraines im my 20's"  .  Hyperlipidemia   . Hypertension   . Neuromuscular disorder ([place])   . Osteomyelitis ([place])    right second toe  . Osteoporosis   . Schizophrenia ([place])    schizoaffective  . Substance abuse ([place])     Family History  Problem Relation Age of Onset  . Heart disease Father     Past Surgical History:  Procedure Laterality Date  . AMPUTATION Right 10/29/2016   Procedure: Right Foot 2nd Toe Amputation at Metatarsophalangeal Joint;  Surgeon: Newt Minion, MD;  Location: [facility];  Service: Orthopedics;  Laterality: Right;  . EYE SURGERY    . KNEE SURGERY    . MULTIPLE TOOTH EXTRACTIONS    . VASCULAR SURGERY     vein surgery   Social History   Occupational History  . Not on file  Tobacco Use  . Smoking status: Current Every Day Smoker    Packs/day: 2.00    Years: 53.00    Pack years: 106.00    Types: Cigarettes  . Smokeless tobacco: Never Used  Substance and Sexual Activity  . Alcohol use: No    Alcohol/week: 0.0 standard drinks  . Drug use: No  . Sexual activity: Not on file

## 2018-09-14 DIAGNOSIS — F331 Major depressive disorder, recurrent, moderate: Secondary | ICD-10-CM | POA: Diagnosis not present

## 2018-09-14 DIAGNOSIS — F259 Schizoaffective disorder, unspecified: Secondary | ICD-10-CM | POA: Diagnosis not present

## 2018-09-14 DIAGNOSIS — F419 Anxiety disorder, unspecified: Secondary | ICD-10-CM | POA: Diagnosis not present

## 2018-09-26 ENCOUNTER — Telehealth: Payer: Self-pay | Admitting: Dietician

## 2018-09-26 NOTE — Telephone Encounter (Signed)
Called and left a message for patient to call regarding a nutrition appointment.    Antonieta Iba, RD, LDN, CDE

## 2018-09-26 NOTE — Telephone Encounter (Signed)
Appointment made for 10/19/18 at 2:30.  She states that she is doing well.  Broken shoulder is healing and she has been going to PT.  She is still smoking but less as she has to go outside.  Continues oxygen.  Encouraged to stop smoking.  Continues to drink [employer] Drinks when she can get them.  Will discuss more at our appointment.  Antonieta Iba, RD, LDN, CDE

## 2018-10-03 ENCOUNTER — Ambulatory Visit: Payer: Managed Care, Other (non HMO) | Admitting: Family

## 2018-10-03 ENCOUNTER — Ambulatory Visit: Payer: Managed Care, Other (non HMO) | Admitting: Orthopedic Surgery

## 2018-10-19 ENCOUNTER — Encounter: Payer: Medicare Other | Attending: Family Medicine | Admitting: Dietician

## 2018-10-19 DIAGNOSIS — E118 Type 2 diabetes mellitus with unspecified complications: Secondary | ICD-10-CM | POA: Diagnosis not present

## 2018-10-20 ENCOUNTER — Encounter: Payer: Self-pay | Admitting: Dietician

## 2018-10-20 DIAGNOSIS — E782 Mixed hyperlipidemia: Secondary | ICD-10-CM | POA: Diagnosis not present

## 2018-10-20 DIAGNOSIS — Z794 Long term (current) use of insulin: Secondary | ICD-10-CM | POA: Diagnosis not present

## 2018-10-20 DIAGNOSIS — Z0000 Encounter for general adult medical examination without abnormal findings: Secondary | ICD-10-CM | POA: Diagnosis not present

## 2018-10-20 DIAGNOSIS — Z9981 Dependence on supplemental oxygen: Secondary | ICD-10-CM | POA: Diagnosis not present

## 2018-10-20 DIAGNOSIS — E1142 Type 2 diabetes mellitus with diabetic polyneuropathy: Secondary | ICD-10-CM | POA: Diagnosis not present

## 2018-10-20 DIAGNOSIS — J449 Chronic obstructive pulmonary disease, unspecified: Secondary | ICD-10-CM | POA: Diagnosis not present

## 2018-10-20 DIAGNOSIS — F251 Schizoaffective disorder, depressive type: Secondary | ICD-10-CM | POA: Diagnosis not present

## 2018-10-20 DIAGNOSIS — I10 Essential (primary) hypertension: Secondary | ICD-10-CM | POA: Diagnosis not present

## 2018-10-20 DIAGNOSIS — H409 Unspecified glaucoma: Secondary | ICD-10-CM | POA: Diagnosis not present

## 2018-10-20 DIAGNOSIS — F17210 Nicotine dependence, cigarettes, uncomplicated: Secondary | ICD-10-CM | POA: Diagnosis not present

## 2018-10-20 NOTE — Progress Notes (Signed)
  Medical Nutrition Therapy:  Appt start time: 1450 end time:  1610. This visit was completed via telephone due to the COVID-19 pandemic.   I spoke with [name] and verified that I was speaking with the correct person with two patient identifiers (full name and date of birth).   I discussed the limitations related to this kind of visit and the patient is willing to proceed. She was in her home and I was in my office. She is the only person present for this appointment.  Assessment:  Primary concerns today:  This is a follow up visit for type 2 diabetes.  Last appointment was 03/21/18 and other appointments cancelled/changed due to covid.  Patient states that she continues to require oxygen and probably will for her life.  She is smoking less as her son is rationing them.  She smokes without her oxygen outside.   Her son's continue to help care for her and do her shopping.  One son is with her each afternoon.  She reports that he is working with her to increase strength and has her doing an exercise regime daily. Dentition is poor and requires soft food. She continues to drink 1-2 [employer] drinks daily but this has decreased due to her son's not purchasing them often.    Labs:  A1C 7.3% decreased from 10.1% 02/27/18 Medications include glipizide, basaglar, metformin, and novolog Weight was 203 lbs 04/2018 and has increased patient.  History includes polyneuropathy, hyperlipidemia, HTN, COPD, osteoporosis, smokes, toe amputation, dental issues and few teeth, and schizoaffective disorder.  Patient lives alone.  She has 2 children nearby who are helpful.  She moved here 12 years ago from [place] and is retired from the [employer].  She relies on [employer] or the city bus for transportation. Her sons are currently helping her with transportation.  One son has type 2 diabetes.   Preferred Learning Style:   Auditory  Learning Readiness:   Contemplating  Ready  Change in  progress   DIETARY INTAKE: 24-hr recall:  B ( AM): 2 packets of Maple instant oatmeal  Snk ( AM):   L ( PM): yogurt, tuna or [place] and cheese sandwich, spinach, boiled egg, fruit cup Snk ( PM): D ( PM): homemade chili or homemade chicken soup Snk ( PM):   Beverages: water, coffee with sweet and low and whole milk 1-2 [employer] Drinks daily  Usual physical activity: ADL's and some with assistance from son  Progress Towards Goal(s):  In progress.   Nutritional Diagnosis:  NB-1.1 Food and nutrition-related knowledge deficit As related to balance of carbohydrate, protein, and fat.  As evidenced by diet hx and patient report.    Intervention:  Nutrition education continued with focus of change of beverages.  Encouragement provided..  Teaching Method Utilized:  Auditory   Barriers to learning/adherence to lifestyle change: physical ability, stress, addiction  Demonstrated degree of understanding via:  Teach Back   Monitoring/Evaluation:  Dietary intake, exercise, and body weight in 2 month(s).

## 2018-11-09 DIAGNOSIS — H401311 Pigmentary glaucoma, right eye, mild stage: Secondary | ICD-10-CM | POA: Diagnosis not present

## 2018-12-12 DIAGNOSIS — F259 Schizoaffective disorder, unspecified: Secondary | ICD-10-CM | POA: Diagnosis not present

## 2018-12-12 DIAGNOSIS — F419 Anxiety disorder, unspecified: Secondary | ICD-10-CM | POA: Diagnosis not present

## 2018-12-12 DIAGNOSIS — F331 Major depressive disorder, recurrent, moderate: Secondary | ICD-10-CM | POA: Diagnosis not present

## 2018-12-14 ENCOUNTER — Encounter: Payer: Self-pay | Admitting: Dietician

## 2018-12-14 ENCOUNTER — Encounter: Payer: Medicare Other | Attending: Family Medicine | Admitting: Dietician

## 2018-12-14 DIAGNOSIS — E118 Type 2 diabetes mellitus with unspecified complications: Secondary | ICD-10-CM | POA: Insufficient documentation

## 2018-12-14 DIAGNOSIS — E119 Type 2 diabetes mellitus without complications: Secondary | ICD-10-CM

## 2018-12-14 NOTE — Progress Notes (Signed)
  Medical Nutrition Therapy:  Appt start time: 1430 end time:  1450. This visit was completed via telephone due to the COVID-19 pandemic.   I spoke with [name].  She is at home and I am at my office.  Assessment:  Primary concerns today:  This is a follow up visit for type 2 diabetes. Her last appointment was 10/19/2018. Since last visit she states that she is gaining weight as her clothes are night. She has tried to quit smoking but has been unable to. She drinks fewer regular [place] as her son does not buy them as often. Her son continues to come mid-day and prepare her lunch, check her BG and make sure she is taking her medications.  History includes polyneuropathy, hyperlipidemia, HTN, COPD, osteoporosis, smokes, toe amputation, dental issues and few teeth, and schizoaffective disorder. Medications include:  Glipizide, basaglar 35 units q hs, Metformin XR, Novolog 7 units before breakfast, 12 units before lunch, and 12 units before dinner.  She does not take the breakfast dose if she skips this meal. Last known A1C  7.3% decreased from 10.1% 02/27/2018. She reports a BG of 89 recently "felt spacey".  Patient lives alone. She has 2 children nearby who are helpful. She moved here 12 years ago from [place] and is retired from the [employer]. She relies on [employer] or the city bus for transportation. Her sons are currently helping her with transportation.  One son has type 2 diabetes.  Dentition is poor and requires soft food.  Preferred Learning Style:   Auditory  Learning Readiness:   Ready   DIETARY INTAKE: 2 meals and snacks daily 24-hr recall:  B ( AM): skips  Snk ( AM): none L ( PM): yogurt, boiled egg, applesauce, spinach leaves, sandwich ([place] and cheese, tuna or chicken salald)  Snk ( PM): dried apricots, black licorice D ( PM): Healthy Choice TV dinner Snk ( PM):  Beverages: water, coffee with sweet and low, [employer] drink- regular  every other day, occasional diet coke  Usual physical activity: none  Progress Towards Goal(s):  In progress.   Nutritional Diagnosis:  NB-1.1 Food and nutrition-related knowledge deficit As related to balance of carbohydrate, protein, and fat.  As evidenced by diet hx and patient report.    Intervention:  Nutrition counseling/education continued.  Discussed that she should not skip breakfast and suggested eating the yogurt that she would usually have with lunch earlier in the morning.  Plan: Avoid skipping breakfast:  Breakfast ideas:  Instant oatmeal  Yogurt, fruit  Toast with cheese or peanut butter and fruit  Boiled egg, toast, fruit  Be mindful to smoke less. Continue to wean off of the [employer] drinks.  Teaching Method Utilized:  Auditory  Handouts given during visit include:  none  Barriers to learning/adherence to lifestyle change: physical ability, stress, addiction  Demonstrated degree of understanding via:  Teach Back   Monitoring/Evaluation:  Dietary intake, exercise and body weight in 1 month(s).  Patient's preference is to have an in person visit.  Encouraged a phone visit and discussed that we will have a phone visit if necessary.

## 2018-12-14 NOTE — Patient Instructions (Signed)
Plan: Avoid skipping breakfast:  Breakfast ideas:  Instant oatmeal  Yogurt, fruit  Toast with cheese or peanut butter and fruit  Boiled egg, toast, fruit  Be mindful to smoke less. Continue to wean off of the [employer] drinks.

## 2019-01-03 DIAGNOSIS — L089 Local infection of the skin and subcutaneous tissue, unspecified: Secondary | ICD-10-CM | POA: Diagnosis not present

## 2019-01-04 ENCOUNTER — Emergency Department (HOSPITAL_COMMUNITY)
Admission: EM | Admit: 2019-01-04 | Discharge: 2019-01-04 | Disposition: A | Discharge status: Home / Self Care | Payer: Medicare Other | Attending: Emergency Medicine | Admitting: Emergency Medicine

## 2019-01-04 ENCOUNTER — Encounter (HOSPITAL_COMMUNITY): Payer: Self-pay | Admitting: Emergency Medicine

## 2019-01-04 ENCOUNTER — Other Ambulatory Visit: Payer: Self-pay

## 2019-01-04 DIAGNOSIS — Z7984 Long term (current) use of oral hypoglycemic drugs: Secondary | ICD-10-CM | POA: Insufficient documentation

## 2019-01-04 DIAGNOSIS — L03115 Cellulitis of right lower limb: Secondary | ICD-10-CM | POA: Diagnosis not present

## 2019-01-04 DIAGNOSIS — Z7982 Long term (current) use of aspirin: Secondary | ICD-10-CM | POA: Diagnosis not present

## 2019-01-04 DIAGNOSIS — F17210 Nicotine dependence, cigarettes, uncomplicated: Secondary | ICD-10-CM | POA: Insufficient documentation

## 2019-01-04 DIAGNOSIS — I10 Essential (primary) hypertension: Secondary | ICD-10-CM | POA: Diagnosis not present

## 2019-01-04 DIAGNOSIS — L089 Local infection of the skin and subcutaneous tissue, unspecified: Secondary | ICD-10-CM | POA: Diagnosis present

## 2019-01-04 DIAGNOSIS — J449 Chronic obstructive pulmonary disease, unspecified: Secondary | ICD-10-CM | POA: Diagnosis not present

## 2019-01-04 DIAGNOSIS — E119 Type 2 diabetes mellitus without complications: Secondary | ICD-10-CM | POA: Insufficient documentation

## 2019-01-04 MED ORDER — DOXYCYCLINE HYCLATE 100 MG PO CAPS: 100.0000 mg | ORAL_CAPSULE | Freq: Two times a day (BID) | ORAL | 0 refills | Status: AC

## 2019-01-04 MED ORDER — CEPHALEXIN 500 MG PO CAPS: 500.0000 mg | ORAL_CAPSULE | Freq: Four times a day (QID) | ORAL | 0 refills | Status: AC

## 2019-01-04 NOTE — ED Triage Notes (Signed)
Pt reports Thanksgiving night she fell and scraped right leg. reports has gotten worse and tried treating at home herself but wasn't getting any better. Saw her PCP and given antibiotics last week. Went for shot of antibiotics yesterday. Reports leg looking worse and doesn't want to lose it.

## 2019-01-04 NOTE — ED Provider Notes (Signed)
[facility] DEPT Provider Note   CSN: [id number] Arrival date & time: 01/04/19  1453     History Chief Complaint  Patient presents with  . leg infection    [name] is a 73 y.o. female.  73 year old female with history of COPD on chronic oxygen presents for evaluation of right lower extremity wound.  Sustained the wound several weeks ago and has been treated by her PCP with antibiotics.  She is concerned that the wound is not improving.  Denies any fever or emesis.  No new numbness or tingling distal to the end side of injury.  No redness going up her leg.  No drainage from the wound itself.  Has recently treated with IV antibiotics.        Past Medical History:  Diagnosis Date  . Anxiety   . Arthritis   . Cataract   . COPD (chronic obstructive pulmonary disease) ([place])   . Depression   . Diabetes mellitus without complication ([place])   . Glaucoma   . Headache    "migraines im my 20's"  . Hyperlipidemia   . Hypertension   . Neuromuscular disorder ([place])   . Osteomyelitis ([place])    right second toe  . Osteoporosis   . Schizophrenia ([place])    schizoaffective  . Substance abuse [facility])     Patient Active Problem List   Diagnosis Date Noted  . Pain in right shoulder 08/02/2018  . Idiopathic chronic venous hypertension of both lower extremities with inflammation 06/07/2017  . Ulcer of toe of left foot, limited to breakdown of skin ([place]) 06/07/2017  . History of amputation of lesser toe of right foot ([place]) 11/05/2016  . Diabetic polyneuropathy associated with type 2 diabetes mellitus ([place]) 10/05/2016  . Fracture of one rib, left side, initial encounter for closed fracture 01/22/2016  . Postinflammatory pulmonary fibrosis ([place]) 09/21/2015  . COPD mixed type ([place]) 09/18/2015  . FREQUENCY, URINARY 07/10/2007  . 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 Smoker 02/23/2007  . Essential hypertension 02/23/2007  . DIAB W/O MENTION COMP TYPE  II/UNS TYPE UNCNTRL 01/20/2007  . HYPERLIPIDEMIA 01/20/2007  . HEADACHE 01/20/2007    Past Surgical History:  Procedure Laterality Date  . AMPUTATION Right 10/29/2016   Procedure: Right Foot 2nd Toe Amputation at Metatarsophalangeal Joint;  Surgeon: Newt Minion, MD;  Location: [facility];  Service: Orthopedics;  Laterality: Right;  . EYE SURGERY    . KNEE SURGERY    . MULTIPLE TOOTH EXTRACTIONS    . VASCULAR SURGERY     vein surgery     OB History   No obstetric history on file.     Family History  Problem Relation Age of Onset  . Heart disease Father     Social History   Tobacco Use  . Smoking status: Current Every Day Smoker    Packs/day: 2.00    Years: 53.00    Pack years: 106.00    Types: Cigarettes  . Smokeless tobacco: Never Used  Substance Use Topics  . Alcohol use: No    Alcohol/week: 0.0 standard drinks  . Drug use: No    Home Medications Prior to Admission medications   Medication Sig Start Date End Date Taking? Authorizing Provider  acetaminophen-codeine (TYLENOL #3) 300-30 MG tablet TK 1 T PO Q 6 H FOR 5 DAYS PRN 03/02/18   [provider]  albuterol (PROVENTIL HFA;VENTOLIN HFA) 108 (90 Base) MCG/ACT inhaler Inhale 2 puffs into the lungs every 6 (six) hours  as needed for wheezing or shortness of breath.    [provider]  amLODipine (NORVASC) 10 MG tablet Take 10 mg by mouth daily.    [provider]  aspirin EC 81 MG tablet Take 81 mg by mouth daily.    [provider]  atorvastatin (LIPITOR) 20 MG tablet Take 20 mg by mouth daily.    [provider]  BD PEN NEEDLE NANO U/F 32G X 4 MM MISC  12/23/15   [provider]  bimatoprost (LUMIGAN) 0.03 % ophthalmic solution Place 1 drop into both eyes at bedtime.    [provider]  budesonide-formoterol (SYMBICORT) 160-4.5 MCG/ACT inhaler Inhale 2 puffs into the lungs 2 (two) times daily.    [provider]  chlorproMAZINE (THORAZINE) 100 MG  tablet Take 100 mg by mouth at bedtime.     [provider]  doxycycline (VIBRAMYCIN) 100 MG capsule Take 1 capsule (100 mg total) by mouth 2 (two) times daily. 10/22/17   Wurst, [place], PA-C  glipiZIDE (GLUCOTROL XL) 10 MG 24 hr tablet Take 10 mg by mouth daily. 01/04/18   [provider]  hydrOXYzine (ATARAX/VISTARIL) 10 MG tablet Take 10 mg by mouth 2 (two) times daily.     [provider]  ibuprofen (ADVIL,MOTRIN) 400 MG tablet Take 1 tablet (400 mg total) by mouth every 6 (six) hours as needed for moderate pain. 10/22/17   Wurst, [place], PA-C  Insulin Glargine (BASAGLAR KWIKPEN) 100 UNIT/ML SOPN Inject 35 Units into the skin at bedtime.     [provider]  levofloxacin (LEVAQUIN) 500 MG tablet Take 1 tablet (500 mg total) by mouth daily. 04/10/18   [name], MD  metFORMIN (GLUCOPHAGE) 1000 MG tablet Take 1,000 mg by mouth 2 (two) times daily with a meal.     [provider]  metFORMIN (GLUCOPHAGE-XR) 750 MG 24 hr tablet Take 750 mg by mouth daily with breakfast.    [provider]  morphine (MSIR) 15 MG tablet Take 0.5 tablets (7.5 mg total) by mouth every 6 (six) hours as needed for moderate pain or severe pain. 02/27/18   Davonna Belling, MD  NOVOLOG FLEXPEN 100 UNIT/ML FlexPen Inject 7-9 Units into the skin 3 (three) times daily with meals. Take 7 units in the am, 9 units at lunch and 9 units at dinner. 11/15/15   [provider]  pregabalin (LYRICA) 100 MG capsule Take 100 mg by mouth 3 (three) times daily.    [provider]  sertraline (ZOLOFT) 100 MG tablet Take 100 mg by mouth daily.    [provider]  topiramate (TOPAMAX) 50 MG tablet Take 50 mg by mouth daily.  01/03/16   [provider]  traMADol (ULTRAM) 50 MG tablet Take 1 tablet (50 mg total) by mouth every 12 (twelve) hours as needed for moderate pain. 08/02/18   Suzan Slick, NP  valsartan (DIOVAN) 160 MG tablet Take 160 mg by mouth  daily.    [provider]  vitamin C (ASCORBIC ACID) 500 MG tablet Take 500 mg by mouth daily as needed (cold prevention).    [provider]    Allergies    Sitagliptin phosphate  Review of Systems   Review of Systems  All other systems reviewed and are negative.   Physical Exam Updated Vital Signs BP 125/72 (BP Location: Right Arm)   Pulse 75   Temp 99 F (37.2 C) (Oral)   Resp 19   SpO2 100%  Physical Exam Vitals and nursing note reviewed.  Constitutional:      General: She is not in acute distress.    Appearance: Normal appearance. She is well-developed. She is not toxic-appearing.  HENT:     Head: Normocephalic and atraumatic.  Eyes:     General: Lids are normal.     Conjunctiva/sclera: Conjunctivae normal.     Pupils: Pupils are equal, round, and reactive to light.  Neck:     Thyroid: No thyroid mass.     Trachea: No tracheal deviation.  Cardiovascular:     Rate and Rhythm: Normal rate and regular rhythm.     Heart sounds: Normal heart sounds. No murmur. No gallop.   Pulmonary:     Effort: Pulmonary effort is normal. No respiratory distress.     Breath sounds: Normal breath sounds. No stridor. No decreased breath sounds, wheezing, rhonchi or rales.  Abdominal:     General: Bowel sounds are normal. There is no distension.     Palpations: Abdomen is soft.     Tenderness: There is no abdominal tenderness. There is no rebound.  Musculoskeletal:        General: No tenderness. Normal range of motion.     Cervical back: Normal range of motion and neck supple.       Legs:  Skin:    General: Skin is warm and dry.     Findings: No abrasion or rash.  Neurological:     Mental Status: She is alert and oriented to person, place, and time.     GCS: GCS eye subscore is 4. GCS verbal subscore is 5. GCS motor subscore is 6.     Cranial Nerves: No cranial nerve deficit.     Sensory: No sensory deficit.  Psychiatric:        Speech: Speech normal.         Behavior: Behavior normal.     ED Results / Procedures / Treatments   Labs (all labs ordered are listed, but only abnormal results are displayed) Labs Reviewed - No data to display  EKG EKG Interpretation  Date/Time:  Thursday January 04 2019 15:06:04 EST Ventricular Rate:  67 PR Interval:    QRS Duration: 98 QT Interval:  396 QTC Calculation: 418 R Axis:   18 Text Interpretation: Sinus or ectopic atrial rhythm Short PR interval Anteroseptal infarct, old No significant change since last tracing Confirmed by [name] ([id number]) on 01/04/2019 3:16:06 PM   Radiology No results found.  Procedures Procedures (including critical care time)  Medications Ordered in ED Medications - No data to display  ED Course  I have reviewed the triage vital signs and the nursing notes.  Pertinent labs & imaging results that were available during my care of the patient were reviewed by me and considered in my medical decision making (see chart for details).    MDM Rules/Calculators/A&P     CHA2DS2/VAS Stroke Risk Points      N/A >= 2 Points: High Risk  1 - 1.99 Points: Medium Risk  0 Points: Low Risk    A final score could not be computed because of missing components.: Last  Change: N/A     This score determines the patient's risk of having a stroke if the  patient has atrial fibrillation.      This score is not applicable to this patient. Components are not  calculated.  Patient on doxycycline currently for her infection.  He has no systemic signs at this time.  Will add Keflex continue doxy and she will follow-up with her doctor.  Will give referral to the wound care clinic. Final Clinical Impression(s) / ED Diagnoses Final diagnoses:  None    Rx / DC Orders ED Discharge Orders    None       [name], MD 01/04/19 1654

## 2019-01-04 NOTE — ED Notes (Signed)
Pts wound on right lower leg dressed with Vaseline gauze and Kerlix

## 2019-01-04 NOTE — ED Notes (Signed)
MD aware son is at bedside.

## 2019-01-04 NOTE — ED Notes (Signed)
Attempted to call pts son. No answer

## 2019-01-04 NOTE — ED Notes (Signed)
EDP at bedside  

## 2019-01-05 ENCOUNTER — Encounter (HOSPITAL_BASED_OUTPATIENT_CLINIC_OR_DEPARTMENT_OTHER): Payer: Medicare Other | Attending: Internal Medicine | Admitting: Internal Medicine

## 2019-01-05 DIAGNOSIS — L97212 Non-pressure chronic ulcer of right calf with fat layer exposed: Secondary | ICD-10-CM | POA: Insufficient documentation

## 2019-01-05 DIAGNOSIS — Z89421 Acquired absence of other right toe(s): Secondary | ICD-10-CM | POA: Insufficient documentation

## 2019-01-05 DIAGNOSIS — F17210 Nicotine dependence, cigarettes, uncomplicated: Secondary | ICD-10-CM | POA: Diagnosis not present

## 2019-01-05 DIAGNOSIS — J449 Chronic obstructive pulmonary disease, unspecified: Secondary | ICD-10-CM | POA: Diagnosis not present

## 2019-01-05 DIAGNOSIS — L97219 Non-pressure chronic ulcer of right calf with unspecified severity: Secondary | ICD-10-CM | POA: Diagnosis not present

## 2019-01-05 DIAGNOSIS — W228XXA Striking against or struck by other objects, initial encounter: Secondary | ICD-10-CM | POA: Diagnosis not present

## 2019-01-05 DIAGNOSIS — Z8249 Family history of ischemic heart disease and other diseases of the circulatory system: Secondary | ICD-10-CM | POA: Diagnosis not present

## 2019-01-05 DIAGNOSIS — I872 Venous insufficiency (chronic) (peripheral): Secondary | ICD-10-CM | POA: Diagnosis not present

## 2019-01-05 DIAGNOSIS — S8011XA Contusion of right lower leg, initial encounter: Secondary | ICD-10-CM | POA: Insufficient documentation

## 2019-01-05 DIAGNOSIS — E11622 Type 2 diabetes mellitus with other skin ulcer: Secondary | ICD-10-CM | POA: Diagnosis not present

## 2019-01-05 DIAGNOSIS — F209 Schizophrenia, unspecified: Secondary | ICD-10-CM | POA: Diagnosis not present

## 2019-01-05 DIAGNOSIS — I10 Essential (primary) hypertension: Secondary | ICD-10-CM | POA: Diagnosis not present

## 2019-01-05 DIAGNOSIS — Z9981 Dependence on supplemental oxygen: Secondary | ICD-10-CM | POA: Insufficient documentation

## 2019-01-08 NOTE — Progress Notes (Signed)
[name], [name] ([id number]) Visit Report for 01/05/2019 Chief Complaint Document Details Patient Name: Date of Service: [name], [name] 01/05/2019 1:15 PM Medical Record [id number] Patient Account Number: [id number] Date of Birth/Sex: Treating RN: [birth year] (73 y.o. Female) Kela Millin Primary Care Provider: Jillyn Ledger Other Clinician: Referring Provider: Treating Provider/Extender:[name], [name], [name] in Treatment: 0 Information Obtained from: Patient Chief Complaint 01/05/2019; patient arrives in clinic for review of contusion on the right lateral calf area Electronic Signature(s) Signed: 01/05/2019 6:34:01 PM By: Linton Ham MD Entered By: Linton Ham on 01/05/2019 15:15:46 -------------------------------------------------------------------------------- HPI Details Patient Name: Date of Service: [name] 01/05/2019 1:15 PM Medical Record [id number] Patient Account Number: [id number] Date of Birth/Sex: Treating RN: 1945/04/19 (73 y.o. Female) Kela Millin Primary Care Provider: Jillyn Ledger Other Clinician: Referring Provider: Treating Provider/Extender:Otillia Cordone, [name], [name] in Treatment: 0 History of Present Illness HPI Description: ADMISSION 01/05/2019 This is a 73 year old woman with advanced COPD on chronic O2. She managed to strike her lower leg while getting out of a car on Thanksgiving day. She had a wound. The vent they tried to look after themselves at home eventually saw her primary doctor a week later. She was given Keflex and Neosporin. An x-ray was apparently done but I do not have these results. She was seen in the ER last night and given doxycycline this appointment was arranged. The patient has a 3.5 x 2 surface area of black eschar and wound. Swelling inferiorly suggestive of a hematoma. I see no current evidence of infection Past medical history type 2 diabetes recent  hemoglobin A1c of 7.6 in April. Chronic COPD the on oxygen. 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 ABI in this clinic was 1.3 on the right Electronic Signature(s) Signed: 01/05/2019 6:34:01 PM By: Linton Ham MD Entered By: Linton Ham on 01/05/2019 15:17:40 -------------------------------------------------------------------------------- Physical Exam Details Patient Name: Date of Service: [name] 01/05/2019 1:15 PM Medical Record [id number] Patient Account Number: [id number] Date of Birth/Sex: Treating RN: March 30, 1945 (73 y.o. Female) Kela Millin Primary Care Provider: Jillyn Ledger Other Clinician: Referring Provider: Treating Provider/Extender:[name], [name], [name] in Treatment: 0 Constitutional Sitting or standing Blood Pressure is within target range for patient.. Pulse regular and within target range for patient.Marland Kitchen Respirations regular, non-labored and within target range.. Temperature is normal and within the target range for the patient.Marland Kitchen Appears in no distress. Eyes Conjunctivae clear. No discharge.no icterus. Respiratory Work of breathing seem normal. She is breathing oxygen through a facemask. Decreased air entry bilaterally prolonged expiratory phase with expiratory wheezing. Cardiovascular Heart rhythm and rate regular, without murmur or gallop. JVP not elevated.. Pedal pulses are robust on the right. I do not believe there is a arterial issue.. Gastrointestinal (GI) Abdomen is soft and non-distended without masses or tenderness.. Integumentary (Hair, Skin) No erythema around the wound. Psychiatric appears at normal baseline. Notes Wound exam; area on the right lateral calf. Tightly adherent black eschar over the wound surface. This was not debrided. Inferior and superior to the wound a  fairly large area of subcutaneous swelling. This is compatible with a hematoma. I do not believe there is any active infection I did not alter her antibiotics Electronic Signature(s) Signed: 01/05/2019 6:34:01 PM By: Linton Ham MD Entered By: Linton Ham on 01/05/2019 15:19:33 -------------------------------------------------------------------------------- Physician Orders Details Patient Name: Date of Service: [name] 01/05/2019 1:15 PM Medical Record [id number] Patient Account Number: [id number] Date  of Birth/Sex: Treating RN: 1945-08-04 (73 y.o. Female) Kela Millin Primary Care Provider: Jillyn Ledger Other Clinician: Referring Provider: Treating Provider/Extender:[name], [name], [name] in Treatment: 0 Verbal / Phone Orders: No Diagnosis Coding Follow-up Appointments Return Appointment in 1 week. Dressing Change Frequency Do not change entire dressing for one week. Skin Barriers/Peri-Wound Care Moisturizing lotion Wound Cleansing May shower with protection. - can use cast protector Primary Wound Dressing Calcium Alginate with Silver Secondary Dressing ABD pad Edema Control 3 Layer Compression System - Right Lower Extremity Avoid standing for long periods of time Elevate legs to the level of the heart or above for 30 minutes daily and/or when sitting, a frequency of: Electronic Signature(s) Signed: 01/05/2019 6:34:01 PM By: Linton Ham MD Signed: 01/08/2019 5:56:42 PM By: Kela Millin Entered By: Kela Millin on 01/05/2019 15:02:41 -------------------------------------------------------------------------------- Problem List Details Patient Name: Date of Service: [name] 01/05/2019 1:15 PM Medical Record [id number] Patient Account Number: [id number] Date of Birth/Sex: Treating RN: 04/12/1945 (73 y.o. Female) Kela Millin Primary Care Provider: Jillyn Ledger Other Clinician: Referring  Provider: Treating Provider/Extender:[name], [name], [name] in Treatment: 0 Active Problems ICD-10 Evaluated Encounter Code Description Active Date Today Diagnosis L97.218 Non-pressure chronic ulcer of right calf with other 01/05/2019 No Yes specified severity S80.11XD Contusion of right lower leg, subsequent encounter 01/05/2019 No Yes Inactive Problems Resolved Problems Electronic Signature(s) Signed: 01/05/2019 6:34:01 PM By: Linton Ham MD Entered By: Linton Ham on 01/05/2019 15:14:51 -------------------------------------------------------------------------------- Progress Note Details Patient Name: Date of Service: [name] 01/05/2019 1:15 PM Medical Record [id number] Patient Account Number: [id number] Date of Birth/Sex: Treating RN: 1946/01/25 (73 y.o. Female) Kela Millin Primary Care Provider: Jillyn Ledger Other Clinician: Referring Provider: Treating Provider/Extender:Claudene Gatliff, [name], [name] in Treatment: 0 Subjective Chief Complaint Information obtained from Patient 01/05/2019; patient arrives in clinic for review of contusion on the right lateral calf area History of Present Illness (HPI) ADMISSION 01/05/2019 This is a 73 year old woman with advanced COPD on chronic O2. She managed to strike her lower leg while getting out of a car on Thanksgiving day. She had a wound. The vent they tried to look after themselves at home eventually saw her primary doctor a week later. She was given Keflex and Neosporin. An x-ray was apparently done but I do not have these results. She was seen in the ER last night and given doxycycline this appointment was arranged. The patient has a 3.5 x 2 surface area of black eschar and wound. Swelling inferiorly suggestive of a hematoma. I see no current evidence of infection Past medical history type 2 diabetes recent hemoglobin A1c of 7.6 in April. Chronic COPD the on oxygen. 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 ABI in this clinic was 1.3 on the right Patient History Information obtained from Patient. Allergies Januvia (Reaction: nausea) Family History Heart Disease - Father. Social History Current every day smoker - 15 cigs/per day, Marital Status - Single, Alcohol Use - Never, Drug Use - No History, Caffeine Use - Daily - Coffee. Medical History Eyes Patient has history of Glaucoma Respiratory Patient has history of Chronic Obstructive Pulmonary Disease (COPD) Cardiovascular Patient has history of Hypertension Endocrine Patient has history of Type II Diabetes Musculoskeletal Patient has history of Osteoarthritis, Osteomyelitis - Right 2nd toe 2018 Neurologic Patient has history of Neuropathy Psychiatric Patient has history of Confinement Anxiety Patient is treated with Insulin, Oral Agents. Blood sugar is tested.  Medical And Surgical History Notes Cardiovascular Hyperlipidemia Musculoskeletal right 2nd toe amp 2018, Osteoporosis Psychiatric Schizophrenia, Depression Review of Systems (ROS) Constitutional Symptoms (General Health) Complains or has symptoms of Fatigue. Eyes Complains or has symptoms of Glasses / Contacts - glasses. Ear/Nose/Mouth/Throat Denies complaints or symptoms of Chronic sinus problems or rhinitis. Genitourinary Denies complaints or symptoms of Frequent urination. Integumentary (Skin) Complains or has symptoms of Wounds - wound on right lower leg. Objective Constitutional Sitting or standing Blood Pressure is within target range for patient.. Pulse regular and within target range for patient.Marland Kitchen Respirations regular, non-labored and within target range.. Temperature is normal and within the target range for the patient.Marland Kitchen Appears in no distress. Vitals Time Taken: 2:15 PM, Height: 62 in, Source:  Stated, Temperature: 98.3 F, Pulse: 71 bpm, Respiratory Rate: 18 breaths/min, Blood Pressure: 106/44 mmHg, Capillary Blood Glucose: 160 mg/dl. General Notes: glucose per pt report Eyes Conjunctivae clear. No discharge.no icterus. Respiratory Work of breathing seem normal. She is breathing oxygen through a facemask. Decreased air entry bilaterally prolonged expiratory phase with expiratory wheezing. Cardiovascular Heart rhythm and rate regular, without murmur or gallop. JVP not elevated.. Pedal pulses are robust on the right. I do not believe there is a arterial issue.. Gastrointestinal (GI) Abdomen is soft and non-distended without masses or tenderness.Marland Kitchen Psychiatric appears at normal baseline. General Notes: Wound exam; area on the right lateral calf. Tightly adherent black eschar over the wound surface. This was not debrided. Inferior and superior to the wound a fairly large area of subcutaneous swelling. This is compatible with a hematoma. I do not believe there is any active infection I did not alter her antibiotics Integumentary (Hair, Skin) No erythema around the wound. Wound #1 status is Open. Original cause of wound was Trauma. The wound is located on the Right,Lateral Lower Leg. The wound measures 3.5cm length x 2cm width x 0.1cm depth; 5.498cm^2 area and 0.55cm^3 volume. There is no tunneling or undermining noted. There is a medium amount of serosanguineous drainage noted. The wound margin is flat and intact. There is no granulation within the wound bed. There is a large (67-100%) amount of necrotic tissue within the wound bed including Eschar. 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 There was a Three Layer Compression Therapy Procedure by  Deon Pilling, RN. Post procedure Diagnosis Wound #1: Same as Pre-Procedure Plan Follow-up Appointments: Return Appointment in 1 week. Dressing Change Frequency: Do not change entire dressing for one week. Skin Barriers/Peri-Wound Care: Moisturizing lotion Wound Cleansing: May shower with protection. - can use cast protector Primary Wound Dressing: Calcium Alginate with Silver Secondary Dressing: ABD pad Edema Control: 3 Layer Compression System - Right Lower Extremity Avoid standing for long periods of time Elevate legs to the level of the heart or above for 30 minutes daily and/or when sitting, a frequency of: 1. Silver alginate/ABDs over the eschared surface. 3 layer compression 2. I explained to the son in some detail the reasoning for not debriding the surface as of yet. I also told him that is likely the surface will open and there will be old sanguinous bleeding. He will call us on weekdays if this happens. 3. Otherwise I will see her back in 1 week's time. Electronic Signature(s) Signed: 01/05/2019 6:34:01 PM By: Linton Ham MD Entered By: Linton Ham  on 01/05/2019 15:20:40 -------------------------------------------------------------------------------- HxROS Details Patient Name: Date of Service: [name], [name] 01/05/2019 1:15 PM Medical Record [id number] Patient Account Number: [id number] Date of Birth/Sex: Treating RN: Aug 29, 1945 (73 y.o. Female) Levan Hurst Primary Care Provider: Jillyn Ledger Other Clinician: Referring Provider: Treating Provider/Extender:Maleia Weems, [name], [name] in Treatment: 0 Information Obtained From Patient Constitutional Symptoms ([place]) Complaints and Symptoms: Positive for: Fatigue Eyes Complaints and Symptoms: Positive for: Glasses / Contacts - glasses Medical History: Positive for: Glaucoma Ear/Nose/Mouth/Throat Complaints and Symptoms: Negative for: Chronic sinus problems or  rhinitis Genitourinary Complaints and Symptoms: Negative for: Frequent urination Integumentary (Skin) Complaints and Symptoms: Positive for: Wounds - wound on right lower leg Hematologic/Lymphatic Respiratory Medical History: Positive for: Chronic Obstructive Pulmonary Disease (COPD) Cardiovascular Medical History: Positive for: Hypertension Past Medical History Notes: Hyperlipidemia Gastrointestinal Endocrine Medical History: Positive for: Type II Diabetes Time with diabetes: 20 years Treated with: Insulin, Oral agents Blood sugar tested every day: Yes Tested : once a day Immunological Musculoskeletal Medical History: Positive for: Osteoarthritis; Osteomyelitis - Right 2nd toe 2018 Past Medical History Notes: right 2nd toe amp 2018, Osteoporosis Neurologic Medical History: Positive for: Neuropathy Oncologic Psychiatric Medical History: Positive for: Confinement Anxiety Past Medical History Notes: Schizophrenia, Depression HBO Extended History Items Eyes: Glaucoma Immunizations Pneumococcal Vaccine: Received Pneumococcal Vaccination: Yes Implantable Devices None Family and Social History Heart Disease: Yes - Father; Current every day smoker - 15 cigs/per day; Marital Status - Single; Alcohol Use: Never; Drug Use: No History; Caffeine Use: Daily - Coffee; Financial Concerns: No; Food, Clothing or Shelter Needs: No; Support System Lacking: No; Transportation Concerns: No Electronic Signature(s) Signed: 01/05/2019 6:34:01 PM By: Linton Ham MD Signed: 01/08/2019 6:02:21 PM By: Levan Hurst RN, BSN Entered By: Levan Hurst on 01/05/2019 14:38:59 -------------------------------------------------------------------------------- [place] Details Patient Name: Date of Service: [name] 01/05/2019 Medical Record [id number] Patient Account Number: [id number] Date of Birth/Sex: Treating RN: 1945/08/19 (73 y.o. Female) Kela Millin Primary Care Provider: Jillyn Ledger Other Clinician: Referring Provider: Treating Provider/Extender:Montie Swiderski, [name], [name] in Treatment: 0 Diagnosis Coding ICD-10 Codes Code Description [phone] Non-pressure chronic ulcer of right calf with other specified severity S80.11XD Contusion of right lower leg, subsequent encounter Facility Procedures CPT4 Code: [id number] Description: 99213 - WOUND CARE VISIT-LEV 3 EST PT Modifier: Quantity: 1 Physician Procedures CPT4 Code Description: [id number] WC PHYS LEVEL 3 NEW PT ICD-10 Diagnosis Description L97.218 Non-pressure chronic ulcer of right calf with other sp S80.11XD Contusion of right lower leg, subsequent encounter Modifier: ecified severity Quantity: 1 Electronic Signature(s) Signed: 01/05/2019 6:34:01 PM By: Linton Ham MD Entered By: Linton Ham on 01/05/2019 15:21:02

## 2019-01-08 NOTE — Progress Notes (Signed)
AYA, KLINKHAMMER ([id number]) Visit Report for 01/05/2019 Abuse/Suicide Risk Screen Details Patient Name: Date of Service: [name], [name] 01/05/2019 1:15 PM Medical Record [id number] Patient Account Number: [id number] Date of Birth/Sex: Treating RN: 03-Mar-1945 (73 y.o. Female) Levan Hurst Primary Care Lynessa Almanzar: Jillyn Ledger Other Clinician: Referring Linh Hedberg: Treating Halea Lieb/Extender:Robson, [name], [name] in Treatment: 0 Abuse/Suicide Risk Screen Items Answer ABUSE RISK SCREEN: Has anyone close to you tried to hurt or harm you recentlyo No Do you feel uncomfortable with anyone in your familyo No Has anyone forced you do things that you didnt want to doo No Electronic Signature(s) Signed: 01/08/2019 6:02:21 PM By: Levan Hurst RN, BSN Entered By: Levan Hurst on 01/05/2019 14:39:09 -------------------------------------------------------------------------------- Activities of Daily Living Details Patient Name: Date of Service: [name] 01/05/2019 1:15 PM Medical Record [id number] Patient Account Number: [id number] Date of Birth/Sex: Treating RN: [birth year] (73 y.o. Female) Levan Hurst Primary Care Sherma Vanmetre: Jillyn Ledger Other Clinician: Referring Reva Pinkley: Treating Nysha Koplin/Extender:Robson, [name], [name] in Treatment: 0 Activities of Daily Living Items Answer Activities of Daily Living (Please select one for each item) Drive Automobile Not Able Take Medications Completely Able Use Telephone Completely Able Care for Appearance Need Assistance Use Toilet Need Assistance Bath / Shower Need Assistance Dress Self Need Assistance Feed Self Need Assistance Walk Need Assistance Get In / Out Bed Need Assistance Housework Need Assistance Prepare Meals Need Assistance Handle Money Completely Able Shop for Self Need Assistance Electronic Signature(s) Signed: 01/08/2019 6:02:21 PM By: Levan Hurst RN,  BSN Entered By: Levan Hurst on 01/05/2019 14:40:06 -------------------------------------------------------------------------------- Education Screening Details Patient Name: Date of Service: [name] 01/05/2019 1:15 PM Medical Record [id number] Patient Account Number: [id number] Date of Birth/Sex: Treating RN: 1945-04-01 (73 y.o. Female) Levan Hurst Primary Care Asia Dusenbury: Jillyn Ledger Other Clinician: Referring Josia Cueva: Treating Cathalina Barcia/Extender:Robson, [name], [name] in Treatment: 0 Primary Learner Assessed: Patient Learning Preferences/Education Level/Primary Language Learning Preference: Explanation, Demonstration, Printed Material Highest Education Level: High School Preferred Language: English Cognitive Barrier Language Barrier: No Translator Needed: No Memory Deficit: No Emotional Barrier: No Cultural/Religious Beliefs Affecting Medical Care: No Physical Barrier Impaired Vision: No Impaired Hearing: No Decreased Hand dexterity: No Knowledge/Comprehension Knowledge Level: High Comprehension Level: High Ability to understand written High instructions: Ability to understand verbal High instructions: Motivation Anxiety Level: Calm Cooperation: Cooperative Education Importance: Acknowledges Need Interest in Health Problems: Asks Questions Perception: Coherent Willingness to Engage in Self- High Management Activities: Readiness to Engage in Self- High Management Activities: Electronic Signature(s) Signed: 01/08/2019 6:02:21 PM By: Levan Hurst RN, BSN Entered By: Levan Hurst on 01/05/2019 14:40:28 -------------------------------------------------------------------------------- Fall Risk Assessment Details Patient Name: Date of Service: [name] 01/05/2019 1:15 PM Medical Record [id number] Patient Account Number: [id number] Date of Birth/Sex: Treating RN: 1945/11/18 (73 y.o. Female) Levan Hurst Primary Care Myriah Boggus: BRAKE, ANDREW Other Clinician: Referring Ambert Virrueta: Treating Yedidya Duddy/Extender:Robson, [name], [name] in Treatment: 0 Fall Risk Assessment Items Have you had 2 or more falls in the last 12 monthso 0 Yes Have you had any fall that resulted in injury in the last 12 monthso 0 No FALLS RISK SCREEN History of falling - immediate or within 3 months 25 Yes Secondary diagnosis (Do you have 2 or more medical diagnoseso) 0 No Ambulatory aid None/bed rest/wheelchair/nurse 0 No Crutches/cane/walker 15 Yes Furniture 0 No Intravenous therapy Access/Saline/Heparin Lock 0 No Weak (short steps with or without shuffle, stooped but able to lift head 0 No while walking, may seek support from furniture) Impaired (short  steps with shuffle, may have difficulty arising from chair, 0 No head down, impaired balance) Mental Status Oriented to own ability 0 Yes Overestimates or forgets limitations 0 No Risk Level: Medium Risk Score: 40 Electronic Signature(s) Signed: 01/08/2019 6:02:21 PM By: Levan Hurst RN, BSN Entered By: Levan Hurst on 01/05/2019 14:41:04 -------------------------------------------------------------------------------- Foot Assessment Details Patient Name: Date of Service: [name] 01/05/2019 1:15 PM Medical Record [id number] Patient Account Number: [id number] Date of Birth/Sex: Treating RN: [birth year] (73 y.o. Female) Levan Hurst Primary Care Atley Neubert: BRAKE, ANDREW Other Clinician: Referring Leigha Olberding: Treating Azucena Dart/Extender:Robson, [name], [name] in Treatment: 0 Foot Assessment Items Site Locations + = Sensation present, - = Sensation absent, C = Callus, U = Ulcer R = Redness, W = Warmth, M = Maceration, PU = Pre-ulcerative lesion F = Fissure, S = Swelling, D = Dryness Assessment Right: Left: Other Deformity: No No Prior Foot Ulcer: No No Prior Amputation: No No Charcot Joint: No  No Ambulatory Status: Ambulatory With Help Assistance Device: Wheelchair Gait: Administrator, arts) Signed: 01/08/2019 6:02:21 PM By: Levan Hurst RN, BSN Entered By: Levan Hurst on 01/05/2019 14:42:41 -------------------------------------------------------------------------------- Nutrition Risk Screening Details Patient Name: Date of Service: [name] 01/05/2019 1:15 PM Medical Record [id number] Patient Account Number: [id number] Date of Birth/Sex: Treating RN: March 01, 1945 (73 y.o. Female) Levan Hurst Primary Care Laporsche Hoeger: Jillyn Ledger Other Clinician: Referring Mykenzie Ebanks: Treating Taiylor Virden/Extender:Robson, [name], [name] in Treatment: 0 Height (in): 62 Weight (lbs): Body Mass Index (BMI): Nutrition Risk Screening Items Score Screening NUTRITION RISK SCREEN: I have an illness or condition that made me change the kind and/or 2 Yes amount of food I eat I eat fewer than two meals per day 0 No I eat few fruits and vegetables, or milk products 0 No I have three or more drinks of beer, liquor or wine almost every day 0 No I have tooth or mouth problems that make it hard for me to eat 0 No I don't always have enough money to buy the food I need 0 No I eat alone most of the time 0 No I take three or more different prescribed or over-the-counter drugs a day 1 Yes 0 No Without wanting to, I have lost or gained 10 pounds in the last six months I am not always physically able to shop, cook and/or feed myself 2 Yes Nutrition Protocols Good Risk Protocol Provide education on elevated blood sugars and Moderate Risk Protocol 0 impact on wound healing, as applicable High Risk Proctocol Risk Level: Moderate Risk Score: 5 Electronic Signature(s) Signed: 01/08/2019 6:02:21 PM By: Levan Hurst RN, BSN Entered By: Levan Hurst on 01/05/2019 14:41:13

## 2019-01-08 NOTE — Progress Notes (Signed)
MITZIE, SCHILKE ([id number]) Visit Report for 01/05/2019 Allergy List Details Patient Name: Date of Service: [name], [name] 01/05/2019 1:15 PM Medical Record [id number] Patient Account Number: [id number] Date of Birth/Sex: Treating RN: 1945/02/06 (73 y.o. Female) Levan Hurst Primary Care Retha Bither: Jillyn Ledger Other Clinician: Referring Jagar Lua: Treating Zarielle Cea/Extender:Robson, [name], [name] in Treatment: 0 Allergies Active Allergies Januvia Reaction: nausea Allergy Notes Electronic Signature(s) Signed: 01/08/2019 6:02:21 PM By: Levan Hurst RN, BSN Entered By: Levan Hurst on 01/05/2019 14:20:09 -------------------------------------------------------------------------------- Arrival Information Details Patient Name: Date of Service: [name] 01/05/2019 1:15 PM Medical Record [id number] Patient Account Number: [id number] Date of Birth/Sex: Treating RN: 25-Mar-1945 (73 y.o. Female) Levan Hurst Primary Care Shantara Goosby: Jillyn Ledger Other Clinician: Referring Venia Riveron: Treating Taquan Bralley/Extender:Robson, [name], [name] in Treatment: 0 Visit Information Patient Arrived: Wheel Chair Arrival Time: 14:10 Accompanied By: son Transfer Assistance: None Patient Identification Verified: Yes Secondary Verification Process Completed: Yes Patient Requires Transmission-Based No Precautions: Patient Has Alerts: No Electronic Signature(s) Signed: 01/08/2019 6:02:21 PM By: Levan Hurst RN, BSN Entered By: Levan Hurst on 01/05/2019 14:11:34 -------------------------------------------------------------------------------- Clinic Level of Care Assessment Details Patient Name: Date of Service: [name], [name] 01/05/2019 1:15 PM Medical Record [id number] Patient Account Number: [id number] Date of Birth/Sex: Treating RN: 27-Feb-1945 (73 y.o. Female) Kela Millin Primary Care Orine Goga: Jillyn Ledger Other  Clinician: Referring Colie Josten: Treating Nikitta Sobiech/Extender:Robson, [name], [name] in Treatment: 0 Clinic Level of Care Assessment Items TOOL 1 Quantity Score X - Use when EandM and Procedure is performed on INITIAL visit 1 0 ASSESSMENTS - Nursing Assessment / Reassessment X - General Physical Exam (combine w/ comprehensive assessment (listed just below) 1 20 when performed on new pt. evals) X - Comprehensive Assessment (HX, ROS, Risk Assessments, Wounds Hx, etc.) 1 25 ASSESSMENTS - Wound and Skin Assessment / Reassessment X - Dermatologic / Skin Assessment (not related to wound area) 1 10 ASSESSMENTS - Ostomy and/or Continence Assessment and Care []  - Incontinence Assessment and Management 0 []  - Ostomy Care Assessment and Management (repouching, etc.) 0 PROCESS - Coordination of Care X - Simple Patient / Family Education for ongoing care 1 15 []  - Complex (extensive) Patient / Family Education for ongoing care 0 X - Staff obtains Programmer, systems, Records, Test Results / Process Orders 1 10 []  - Staff telephones HHA, Nursing Homes / Clarify orders / etc 0 []  - Routine Transfer to another Facility (non-emergent condition) 0 []  - Routine Hospital Admission (non-emergent condition) 0 X - New Admissions / Biomedical engineer / Ordering NPWT, Apligraf, etc. 1 15 []  - Emergency Hospital Admission (emergent condition) 0 PROCESS - Special Needs []  - Pediatric / Minor Patient Management 0 []  - Isolation Patient Management 0 []  - Hearing / Language / Visual special needs 0 []  - Assessment of Community assistance (transportation, D/C planning, etc.) 0 []  - Additional assistance / Altered mentation 0 []  - Support Surface(s) Assessment (bed, cushion, seat, etc.) 0 INTERVENTIONS - Miscellaneous []  - External ear exam 0 []  - Patient Transfer (multiple staff / Civil Service fast streamer / Similar devices) 0 []  - Simple Staple / Suture removal (25 or less) 0 []  - Complex Staple / Suture removal (26 or  more) 0 []  - Hypo/Hyperglycemic Management (do not check if billed separately) 0 X - Ankle / Brachial Index (ABI) - do not check if billed separately 1 15 Has the patient been seen at the hospital within the last three years: Yes Total Score: 110 Level Of Care: New/Established - Level 3 Electronic Signature(s)  Signed: 01/08/2019 5:56:42 PM By: Kela Millin Entered By: Kela Millin on 01/05/2019 15:04:35 -------------------------------------------------------------------------------- Compression Therapy Details Patient Name: Date of Service: [name], [name] 01/05/2019 1:15 PM Medical Record [id number] Patient Account Number: [id number] Date of Birth/Sex: Jan 17, 1946 (73 y.o. Female) Treating RN: Kela Millin Primary Care Esbeidy Mclaine: BRAKE, ANDREW Other Clinician: Referring Danika Kluender: Treating Kayley Zeiders/Extender:Robson, [name], [name] in Treatment: 0 Compression Therapy Performed for Wound Wound #1 Right,Lateral Lower Leg Assessment: Performed By: Clinician Deon Pilling, RN Compression Type: Three Layer Post Procedure Diagnosis Same as Pre-procedure Electronic Signature(s) Signed: 01/08/2019 5:56:42 PM By: Kela Millin Entered By: Kela Millin on 01/05/2019 15:03:14 -------------------------------------------------------------------------------- Encounter Discharge Information Details Patient Name: Date of Service: [name] 01/05/2019 1:15 PM Medical Record [id number] Patient Account Number: [id number] Date of Birth/Sex: Treating RN: 01-19-1946 (73 y.o. Female) Deon Pilling Primary Care Charise Leinbach: Jillyn Ledger Other Clinician: Referring Maston Wight: Treating Lanetta Figuero/Extender:Robson, [name], [name] in Treatment: 0 Encounter Discharge Information Items Discharge Condition: Stable Ambulatory Status: Wheelchair Discharge Destination: Home Transportation: Private Auto Accompanied By: son Schedule Follow-up  Appointment: Yes Clinical Summary of Care: Electronic Signature(s) Signed: 01/05/2019 6:25:32 PM By: Deon Pilling Entered By: Deon Pilling on 01/05/2019 15:34:53 -------------------------------------------------------------------------------- Lower Extremity Assessment Details Patient Name: Date of Service: [name], [name] 01/05/2019 1:15 PM Medical Record [id number] Patient Account Number: [id number] Date of Birth/Sex: Treating RN: Feb 25, 1945 (73 y.o. Female) Levan Hurst Primary Care Christeena Krogh: BRAKE, ANDREW Other Clinician: Referring Carollee Nussbaumer: Treating Sieara Bremer/Extender:Robson, [name], [name] in Treatment: 0 Edema Assessment Assessed: [Left: No] [Right: No] Edema: [Left: Ye] [Right: s] Calf Left: Right: Point of Measurement: 28 cm From Medial Instep cm 42.5 cm Ankle Left: Right: Point of Measurement: 10 cm From Medial Instep cm 22.4 cm Vascular Assessment Pulses: Dorsalis Pedis Palpable: [Right:Yes] Doppler Audible: [Right:Yes] Blood Pressure: Brachial: [Right:106] Ankle: [Right:Dorsalis Pedis: 120 1.13] Electronic Signature(s) Signed: 01/08/2019 6:02:21 PM By: Levan Hurst RN, BSN Entered By: Levan Hurst on 01/05/2019 14:47:56 -------------------------------------------------------------------------------- Multi Wound Chart Details Patient Name: Date of Service: [name] 01/05/2019 1:15 PM Medical Record [id number] Patient Account Number: [id number] Date of Birth/Sex: Treating RN: 11-28-1945 (73 y.o. Female) Dwiggins, [facility] Primary Care Randol Zumstein: BRAKE, ANDREW Other Clinician: Referring Frieda Arnall: Treating Avram Danielson/Extender:Robson, [name], [name] in Treatment: 0 Vital Signs Height(in): 62 Capillary Blood 160 Glucose(mg/dl): Weight(lbs): Pulse(bpm): 71 Body Mass Index(BMI): Blood Pressure(mmHg): 106/44 Temperature(F): 98.3 Respiratory 18 Rate(breaths/min): Photos: [1:No Photos] [N/A:N/A] Wound  Location: [1:Right Lower Leg - Lateral] [N/A:N/A] Wounding Event: [1:Trauma] [N/A:N/A] Primary Etiology: [1:Venous Leg Ulcer] [N/A:N/A] Comorbid History: [1:Glaucoma, Chronic Obstructive Pulmonary Disease (COPD), Hypertension, Type II Diabetes, Osteoarthritis, Osteomyelitis, Neuropathy, Confinement Anxiety] [N/A:N/A] Date Acquired: [1:12/21/2018] [N/A:N/A] Weeks of Treatment: [1:0] [N/A:N/A] Wound Status: [1:Open] [N/A:N/A] Measurements L x W x D 3.5x2x0.1 [N/A:N/A] (cm) Area (cm) : [1:5.498] [N/A:N/A] Volume (cm) : [1:0.55] [N/A:N/A] Classification: [1:Unclassifiable] [N/A:N/A] Exudate Amount: [1:Medium] [N/A:N/A] Exudate Type: [1:Serosanguineous] [N/A:N/A] Exudate Color: [1:red, brown] [N/A:N/A] Wound Margin: [1:Flat and Intact] [N/A:N/A] Granulation Amount: [1:None Present (0%)] [N/A:N/A] Necrotic Amount: [1:Large (67-100%)] [N/A:N/A] Necrotic Tissue: [1:Eschar] [N/A:N/A] Exposed Structures: [1:Fascia: No Fat Layer (Subcutaneous Tissue) Exposed: No Tendon: No Muscle: No Joint: No Bone: No] [N/A:N/A] Epithelialization: [1:None Compression Therapy] [N/A:N/A N/A] Treatment Notes Electronic Signature(s) Signed: 01/05/2019 6:34:01 PM By: Linton Ham MD Signed: 01/08/2019 5:56:42 PM By: Kela Millin Entered By: Linton Ham on 01/05/2019 15:15:05 -------------------------------------------------------------------------------- Multi-Disciplinary Care Plan Details Patient Name: Date of Service: [name] 01/05/2019 1:15 PM Medical Record [id number] Patient Account Number: [id number] Date of Birth/Sex: Treating RN: [birth year] (73 y.o. Female)  Kela Millin Primary Care Denita Lun: BRAKE, Mitzi Hansen Other Clinician: Referring Cordia Miklos: Treating Tykwon Fera/Extender:Robson, [name], [name] in Treatment: 0 Active Inactive Orientation to the Wound Care Program Nursing Diagnoses: Knowledge deficit related to the wound healing center  program Goals: Patient/caregiver will verbalize understanding of the [facility] Program Date Initiated: 01/05/2019 Target Resolution Date: 02/02/2019 Goal Status: Active Interventions: Provide education on orientation to the wound center Notes: Wound/Skin Impairment Nursing Diagnoses: Impaired tissue integrity Goals: Ulcer/skin breakdown will have a volume reduction of 30% by week 4 Date Initiated: 01/05/2019 Target Resolution Date: 02/02/2019 Goal Status: Active Interventions: Provide education on smoking Provide education on ulcer and skin care Notes: Electronic Signature(s) Signed: 01/08/2019 5:56:42 PM By: Kela Millin Entered By: Kela Millin on 01/05/2019 14:59:36 -------------------------------------------------------------------------------- Pain Assessment Details Patient Name: Date of Service: [name], [name] 01/05/2019 1:15 PM Medical Record [id number] Patient Account Number: [id number] Date of Birth/Sex: Treating RN: 10-22-1945 (73 y.o. Female) Levan Hurst Primary Care Tristan Proto: Jillyn Ledger Other Clinician: Referring Jashley Yellin: Treating Owenn Rothermel/Extender:Robson, [name], [name] in Treatment: 0 Active Problems Location of Pain Severity and Description of Pain Patient Has Paino Yes Site Locations Pain Location: Pain in Ulcers With Dressing Change: Yes Duration of the Pain. Constant / Intermittento Intermittent Rate the pain. Current Pain Level: 4 Character of Pain Describe the Pain: Throbbing Pain Management and Medication Current Pain Management: Medication: Yes Cold Application: No Rest: No Massage: No Activity: No T.E.N.S.: No Heat Application: No Leg drop or elevation: No Is the Current Pain Management Adequate: Adequate How does your wound impact your activities of daily livingo Sleep: No Bathing: No Appetite: No Relationship With Others: No Bladder Continence: No Emotions: No Bowel Continence:  No Work: No Toileting: No Drive: No Dressing: No Hobbies: No Electronic Signature(s) Signed: 01/08/2019 6:02:21 PM By: Levan Hurst RN, BSN Entered By: Levan Hurst on 01/05/2019 14:44:07 -------------------------------------------------------------------------------- Patient/Caregiver Education Details Patient Name: [name] 12/11/2020andnbsp1:15 Date of Service: PM Medical Record [id number] Number: Patient Account Number: [id number] Treating RN: 1945-09-19 (73 y.o. Kela Millin Date of Birth/Gender: Female) Other Clinician: Primary Care Treating BRAKE, Rexene Agent Physician: Physician/Extender: Referring Physician: Faythe Ghee in Treatment: 0 Education Assessment Education Provided To: Patient Education Topics Provided Smoking and Wound Healing: Methods: Explain/Verbal Responses: State content correctly Welcome To [facility]: Methods: Explain/Verbal Responses: State content correctly Wound/Skin Impairment: Methods: Explain/Verbal Responses: State content correctly Electronic Signature(s) Signed: 01/08/2019 5:56:42 PM By: Kela Millin Entered By: Kela Millin on 01/05/2019 14:59:51 -------------------------------------------------------------------------------- Wound Assessment Details Patient Name: Date of Service: [name] 01/05/2019 1:15 PM Medical Record [id number] Patient Account Number: [id number] Date of Birth/Sex: Treating RN: 06/13/1945 (73 y.o. Female) Levan Hurst Primary Care Dahlia Nifong: BRAKE, ANDREW Other Clinician: Referring Tagen Brethauer: Treating Marjan Rosman/Extender:Robson, [name], [name] in Treatment: 0 Wound Status Wound Number: 1 Primary Venous Leg Ulcer Etiology: Wound Location: Right Lower Leg - Lateral Wound Open Wounding Event: Trauma Status: Date Acquired: 12/21/2018 Comorbid Glaucoma, Chronic Obstructive Pulmonary Weeks Of Treatment: 0 History:  Disease (COPD), Hypertension, Type II Clustered Wound: No Diabetes, Osteoarthritis, Osteomyelitis, Neuropathy, Confinement Anxiety Photos Wound Measurements Length: (cm) 3.5 % Reduction in Ar Width: (cm) 2 % Reduction in Vo Depth: (cm) 0.1 Epithelialization Area: (cm) 5.498 Tunneling: Volume: (cm) 0.55 Undermining: Wound Description Classification: Unclassifiable Foul Odor Aft Wound Margin: Flat and Intact Slough/Fibrin Exudate Amount: Medium Exudate Type: Serosanguineous Exudate Color: red, brown Wound Bed Granulation Amount: None Present (0%) Necrotic Amount: Large (67-100%) Fascia Expose Necrotic Quality: Eschar Fat Layer (Su  Tendon Expose Muscle Expose Joint Exposed Bone Exposed: Treatment Notes Wound #1 (Right, Lateral Lower Leg) 1. Cleanse With Soap and water 2. Periwound Care Moisturizing lotion 3. Primary Dressing Applied Calcium Alginate Ag 4. Secondary Dressing ABD Pad 6. Support Layer Applied 3 layer compression wrap er Cleansing: No o No Exposed Structure d: No bcutaneous Tissue) Exposed: No d: No d: No : No No ea: 0% lume: 0% : None No No Notes netting. explained the orders, dressings, frequency of change, and when to return to wound center. Electronic Signature(s) Signed: 01/08/2019 3:44:56 PM By: Mikeal Hawthorne EMT/HBOT Signed: 01/08/2019 6:02:21 PM By: Levan Hurst RN, BSN Entered By: Mikeal Hawthorne on 01/08/2019 13:52:46 -------------------------------------------------------------------------------- Vitals Details Patient Name: Date of Service: [name] 01/05/2019 1:15 PM Medical Record [id number] Patient Account Number: [id number] Date of Birth/Sex: Treating RN: 09-May-1945 (73 y.o. Female) Levan Hurst Primary Care Francisca Harbuck: BRAKE, ANDREW Other Clinician: Referring Berta Denson: Treating Dreyton Roessner/Extender:Robson, [name], [name] in Treatment: 0 Vital Signs Time Taken: 14:15 Temperature (F):  98.3 Height (in): 62 Pulse (bpm): 71 Source: Stated Respiratory Rate (breaths/min): 18 Blood Pressure (mmHg): 106/44 Capillary Blood Glucose (mg/dl): 160 Reference Range: 80 - 120 mg / dl Notes glucose per pt report Electronic Signature(s) Signed: 01/08/2019 6:02:21 PM By: Levan Hurst RN, BSN Entered By: Levan Hurst on 01/05/2019 14:19:32

## 2019-01-11 ENCOUNTER — Encounter: Payer: Medicare Other | Attending: Family Medicine | Admitting: Dietician

## 2019-01-11 ENCOUNTER — Other Ambulatory Visit: Payer: Self-pay

## 2019-01-11 DIAGNOSIS — E118 Type 2 diabetes mellitus with unspecified complications: Secondary | ICD-10-CM | POA: Insufficient documentation

## 2019-01-11 DIAGNOSIS — E119 Type 2 diabetes mellitus without complications: Secondary | ICD-10-CM

## 2019-01-11 NOTE — Patient Instructions (Addendum)
Consider choosing 1 protein shake (such as premier, Glucerna or boost glucose control) or 1 diet pepsi instead of 1 Monster drink  Continue to eat balanced meals. You need more protein for wound healing. Do not take the extra potassium unless approved by your doctor.

## 2019-01-11 NOTE — Progress Notes (Signed)
Medical Nutrition Therapy:  Appt start time: [id number] end time:  1430.   Assessment:  Primary concerns today:   Patient is here today with her son.  Telephone visit offered and patient declined.  We last had a telephone visit 12/14/18.  She fell Thanksgiving night.  She has a wound on her leg now and is going to the wound clinic.   She continues to smoke on leaning out of her door. She continues to need home oxygen and has an oxygen concentrator. She continues to drink the [employer] drinks.  The son that is with her today will not buy them but the other son does. Son also reports that his brother has been giving Ms. Geffrard a potassium supplement.  Discussed that this should be stopped and given only at the advice of her MD. She eats 2 meals per day and snacks. Concern is her protein intake especially due to her wound.  Based on usual intake, her protein intake is about 55 grams daily.   Her son continues to come midday and check her BG and prepare her lunch.  She has started to make a favorite soup again as well.  History includes polyneuropathy, hyperlipidemia, HTN, COPD, osteoporosis, smokes, toe amputation, dental issues and few teeth, and schizoaffective disorder. Medications include:  Glipizide, basaglar 34 units q hs, Metformin XR, Novolog 7 units before breakfast, 12 units before lunch, and 12 units before dinner.  She does not take the breakfast dose if she skips this meal. Last known A1C  7.3% decreased from 10.1% 02/27/2018. She reports a BG of 89 recently "felt spacey".  Patient lives alone. She has 2 children nearby who are helpful. She moved here 12 years ago from [place] and is retired from the [employer]. She relies on [employer] or the city bus for transportation.Her sons are currently helping her with transportation. One son has type 2 diabetes.  Dentition is poor and requires soft food.  Preferred Learning Style:   No preference indicated   Learning Readiness:    Contemplating   DIETARY INTAKE:  24-hr recall:  B ( AM): skips  Snk ( AM): none  L ( PM): yogurt, boiled egg, applesauce, spinach leaves, sandwich ([place] and cheese, tuna or chicken salad) Snk ( PM): dried apricots, black licorice D ( PM): Healthy Choice frozen meal Snk ( PM):  Beverages: water, coffee with sweet and low, [employer] Drink (2 daily), occasional diet coke  Usual physical activity: ADL's  Estimated energy needs: 1400 calories 75-85 g protein due to wound  Progress Towards Goal(s):  In progress.   Nutritional Diagnosis:  NB-1.1 Food and nutrition-related knowledge deficit As related to balance of carbohydrates, protein, and fat.  As evidenced by diet hx and patient report.    Intervention:  Nutrition counseling/education continued.  Ms. Verhoeven stated that she is willing to choose a protein drink or a diet soda instead of the Monster drink each day.  Instructed not to take the Potassium without MD approval.    Plan: Consider choosing 1 protein shake (such as premier, Glucerna or boost glucose control) or 1 diet pepsi instead of 1 Monster drink Continue to eat balanced meals. You need more protein for wound healing. Do not take the extra potassium unless approved by your doctor.  Teaching Method Utilized:  Visual Auditory Hands on  Barriers to learning/adherence to lifestyle change: Health status, Readiness for change  Demonstrated degree of understanding via:  Teach Back   Monitoring/Evaluation:  Dietary intake,  exercise, and body weight in 3 month(s).

## 2019-01-12 ENCOUNTER — Emergency Department (HOSPITAL_COMMUNITY)
Admission: EM | Admit: 2019-01-12 | Discharge: 2019-01-12 | Disposition: A | Discharge status: Home / Self Care | Payer: Medicare Other | Attending: Emergency Medicine | Admitting: Emergency Medicine

## 2019-01-12 ENCOUNTER — Emergency Department (HOSPITAL_COMMUNITY): Payer: Medicare Other

## 2019-01-12 ENCOUNTER — Encounter (HOSPITAL_BASED_OUTPATIENT_CLINIC_OR_DEPARTMENT_OTHER): Payer: Medicare Other | Admitting: Internal Medicine

## 2019-01-12 ENCOUNTER — Encounter (HOSPITAL_COMMUNITY): Payer: Self-pay

## 2019-01-12 ENCOUNTER — Other Ambulatory Visit: Payer: Self-pay

## 2019-01-12 DIAGNOSIS — J449 Chronic obstructive pulmonary disease, unspecified: Secondary | ICD-10-CM | POA: Diagnosis not present

## 2019-01-12 DIAGNOSIS — Z7982 Long term (current) use of aspirin: Secondary | ICD-10-CM | POA: Insufficient documentation

## 2019-01-12 DIAGNOSIS — F17210 Nicotine dependence, cigarettes, uncomplicated: Secondary | ICD-10-CM | POA: Insufficient documentation

## 2019-01-12 DIAGNOSIS — E11649 Type 2 diabetes mellitus with hypoglycemia without coma: Secondary | ICD-10-CM | POA: Diagnosis not present

## 2019-01-12 DIAGNOSIS — I10 Essential (primary) hypertension: Secondary | ICD-10-CM | POA: Insufficient documentation

## 2019-01-12 DIAGNOSIS — R5383 Other fatigue: Secondary | ICD-10-CM

## 2019-01-12 DIAGNOSIS — Z79899 Other long term (current) drug therapy: Secondary | ICD-10-CM | POA: Insufficient documentation

## 2019-01-12 DIAGNOSIS — E162 Hypoglycemia, unspecified: Secondary | ICD-10-CM

## 2019-01-12 DIAGNOSIS — Z794 Long term (current) use of insulin: Secondary | ICD-10-CM | POA: Insufficient documentation

## 2019-01-12 LAB — GLUCOSE, CAPILLARY
Glucose-Capillary: 27 mg/dL — CL (ref 70–99)
Glucose-Capillary: 28 mg/dL — CL (ref 70–99)
Glucose-Capillary: 43 mg/dL — CL (ref 70–99)
Glucose-Capillary: 49 mg/dL — ABNORMAL LOW (ref 70–99)

## 2019-01-12 LAB — URINALYSIS, ROUTINE W REFLEX MICROSCOPIC
Bilirubin Urine: NEGATIVE
Glucose, UA: NEGATIVE mg/dL
Hgb urine dipstick: NEGATIVE
Ketones, ur: NEGATIVE mg/dL
Leukocytes,Ua: NEGATIVE
Nitrite: NEGATIVE
Protein, ur: NEGATIVE mg/dL
Specific Gravity, Urine: 1.005 (ref 1.005–1.030)
pH: 6 (ref 5.0–8.0)

## 2019-01-12 LAB — LACTIC ACID, PLASMA: Lactic Acid, Venous: 1.7 mmol/L (ref 0.5–1.9)

## 2019-01-12 LAB — CBC WITH DIFFERENTIAL/PLATELET
Abs Immature Granulocytes: 0.2 10*3/uL — ABNORMAL HIGH (ref 0.00–0.07)
Basophils Absolute: 0.1 10*3/uL (ref 0.0–0.1)
Basophils Relative: 1 %
Eosinophils Absolute: 0.2 10*3/uL (ref 0.0–0.5)
Eosinophils Relative: 2 %
HCT: 43.3 % (ref 36.0–46.0)
Hemoglobin: 13 g/dL (ref 12.0–15.0)
Immature Granulocytes: 2 %
Lymphocytes Relative: 17 %
Lymphs Abs: 2.3 10*3/uL (ref 0.7–4.0)
MCH: 29.9 pg (ref 26.0–34.0)
MCHC: 30 g/dL (ref 30.0–36.0)
MCV: 99.5 fL (ref 80.0–100.0)
Monocytes Absolute: 1.3 10*3/uL — ABNORMAL HIGH (ref 0.1–1.0)
Monocytes Relative: 10 %
Neutro Abs: 9.1 10*3/uL — ABNORMAL HIGH (ref 1.7–7.7)
Neutrophils Relative %: 68 %
Platelets: 262 10*3/uL (ref 150–400)
RBC: 4.35 MIL/uL (ref 3.87–5.11)
RDW: 14.7 % (ref 11.5–15.5)
WBC: 13.2 10*3/uL — ABNORMAL HIGH (ref 4.0–10.5)
nRBC: 0 % (ref 0.0–0.2)

## 2019-01-12 LAB — COMPREHENSIVE METABOLIC PANEL
ALT: 22 U/L (ref 0–44)
AST: 38 U/L (ref 15–41)
Albumin: 3.8 g/dL (ref 3.5–5.0)
Alkaline Phosphatase: 94 U/L (ref 38–126)
Anion gap: 8 (ref 5–15)
BUN: 24 mg/dL — ABNORMAL HIGH (ref 8–23)
CO2: 32 mmol/L (ref 22–32)
Calcium: 9.7 mg/dL (ref 8.9–10.3)
Chloride: 100 mmol/L (ref 98–111)
Creatinine, Ser: 0.76 mg/dL (ref 0.44–1.00)
GFR calc Af Amer: >60 mL/min (ref >60)
GFR calc non Af Amer: >60 mL/min (ref >60)
Glucose, Bld: 85 mg/dL (ref 70–99)
Potassium: 5.3 mmol/L — ABNORMAL HIGH (ref 3.5–5.1)
Sodium: 140 mmol/L (ref 135–145)
Total Bilirubin: 1 mg/dL (ref 0.3–1.2)
Total Protein: 7.2 g/dL (ref 6.5–8.1)

## 2019-01-12 LAB — CBG MONITORING, ED
Glucose-Capillary: 64 mg/dL — ABNORMAL LOW (ref 70–99)
Glucose-Capillary: 182 mg/dL — ABNORMAL HIGH (ref 70–99)
Glucose-Capillary: 183 mg/dL — ABNORMAL HIGH (ref 70–99)

## 2019-01-12 MED ORDER — DEXTROSE 50 % IV SOLN
1.0000 | Freq: Once | INTRAVENOUS | Status: AC
  Administered 2019-01-12: 50 mL via INTRAVENOUS
  Filled 2019-01-12: qty 50

## 2019-01-12 NOTE — ED Notes (Signed)
[name] (son): [phone]

## 2019-01-12 NOTE — ED Triage Notes (Signed)
Pt arrives via WC from wound care clinic. Pt was found to be hypoglycemic at clinic . Pt original CBG was 28. Pt was provided with 2 glucose tabs, 2 Glucerna shake, 2 packs of graham crackers, sweet tea and carrot cake. Pts CBG upon arrival to ED is 64mg /dL

## 2019-01-12 NOTE — ED Notes (Signed)
Son of patient, [name] said call him to give his mother a ride home upon discharge, [phone].

## 2019-01-12 NOTE — Progress Notes (Signed)
KENZA, LEMOS ([id number]) Visit Report for 01/12/2019 HPI Details Patient Name: Date of Service: LORING, FREYTES 01/12/2019 1:00 PM Medical Record [id number] Patient Account Number: [id number] Date of Birth/Sex: Treating RN: May 20, 1945 (73 y.o. F) Primary Care Provider: Jillyn Ledger Other Clinician: Referring Provider: Treating Provider/Extender:Zonya Gudger, [name], [name] in Treatment: 1 History of Present Illness HPI Description: ADMISSION 01/05/2019 This is a 73 year old woman with advanced COPD on chronic O2. She managed to strike her lower leg while getting out of a car on Thanksgiving day. She had a wound. The vent they tried to look after themselves at home eventually saw her primary doctor a week later. She was given Keflex and Neosporin. An x-ray was apparently done but I do not have these results. She was seen in the ER last night and given doxycycline this appointment was arranged. The patient has a 3.5 x 2 surface area of black eschar and wound. Swelling inferiorly suggestive of a hematoma. I see no current evidence of infection Past medical history type 2 diabetes recent hemoglobin A1c of 7.6 in April. Chronic COPD the on oxygen. 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 ABI in this clinic was 1.3 on the right 12/18; the necrotic surface of this hematoma area remained intact which is somewhat surprising. The swelling around the area has gotten better. She is still having some discomfort managed with as needed tramadol Electronic Signature(s) Signed: 01/12/2019 6:01:02 PM By: Linton Ham MD Entered By: Linton Ham on 01/12/2019 14:17:33 -------------------------------------------------------------------------------- Physical Exam Details Patient Name: Date of Service: [name] 01/12/2019 1:00  PM Medical Record [id number] Patient Account Number: [id number] Date of Birth/Sex: Treating RN: 1945-04-09 (73 y.o. F) Primary Care Provider: BRAKE, ANDREW Other Clinician: Referring Provider: Treating Provider/Extender:Akina Maish, [name], [name] in Treatment: 1 Constitutional Sitting or standing Blood Pressure is within target range for patient.. Pulse regular and within target range for patient.Marland Kitchen Respirations regular, non-labored and within target range.. Temperature is normal and within the target range for the patient.Marland Kitchen Appears in no distress. Eyes Conjunctivae clear. No discharge.no icterus. Respiratory work of breathing is normal. Cardiovascular Pedal pulses palpable and strong bilaterally.. Much better edema control. Integumentary (Hair, Skin) Tissue around the actual trauma area is still swollen but a lot less than last week. No erythema no suggestion of cellulitis. Psychiatric appears at normal baseline. Notes Wound exam; area on the right lateral calf. Tightly adherent black eschar over the wound surface is still there. This was not debrided. The swelling around the area is a lot better suggesting ongoing absorption which is what we want to see happen. There is no evidence of infection Electronic Signature(s) Signed: 01/12/2019 6:01:02 PM By: Linton Ham MD Entered By: Linton Ham on 01/12/2019 14:18:49 -------------------------------------------------------------------------------- Physician Orders Details Patient Name: Date of Service: [name] 01/12/2019 1:00 PM Medical Record [id number] Patient Account Number: [id number] Date of Birth/Sex: Treating RN: June 28, 1945 (73 y.o. Clearnce Sorrel Primary Care Provider: Jillyn Ledger Other Clinician: Referring Provider: Treating Provider/Extender:Marili Vader, [name], [name] in Treatment: 1 Verbal / Phone Orders: No Diagnosis Coding ICD-10 Coding Code  Description [id number] Non-pressure chronic ulcer of right calf with other specified severity S80.11XD Contusion of right lower leg, subsequent encounter Follow-up Appointments Return Appointment in 2 weeks. - to see MD Nurse Visit: - next week Dressing Change Frequency Do not change entire dressing for one week. Skin Barriers/Peri-Wound Care Moisturizing lotion Wound Cleansing May shower with  protection. - can use cast protector Primary Wound Dressing Calcium Alginate with Silver Secondary Dressing ABD pad Edema Control 3 Layer Compression System - Right Lower Extremity Avoid standing for long periods of time Elevate legs to the level of the heart or above for 30 minutes daily and/or when sitting, a frequency of: Electronic Signature(s) Signed: 01/12/2019 5:42:17 PM By: Kela Millin Signed: 01/12/2019 6:01:02 PM By: Linton Ham MD Entered By: Kela Millin on 01/12/2019 14:10:24 -------------------------------------------------------------------------------- Problem List Details Patient Name: Date of Service: [name] 01/12/2019 1:00 PM Medical Record [id number] Patient Account Number: [id number] Date of Birth/Sex: Treating RN: [birth year] (73 y.o. F) Primary Care Provider: Jillyn Ledger Other Clinician: Referring Provider: Treating Provider/Extender:Jocelin Schuelke, [name], [name] in Treatment: 1 Active Problems ICD-10 Evaluated Encounter Code Description Active Date Today Diagnosis L97.218 Non-pressure chronic ulcer of right calf with other 01/05/2019 No Yes specified severity S80.11XD Contusion of right lower leg, subsequent encounter 01/05/2019 No Yes Inactive Problems Resolved Problems Electronic Signature(s) Signed: 01/12/2019 6:01:02 PM By: Linton Ham MD Entered By: Linton Ham on 01/12/2019 14:16:52 -------------------------------------------------------------------------------- Progress Note Details Patient  Name: Date of Service: [name] 01/12/2019 1:00 PM Medical Record [id number] Patient Account Number: [id number] Date of Birth/Sex: Treating RN: [birth year] (73 y.o. F) Primary Care Provider: BRAKE, ANDREW Other Clinician: Referring Provider: Treating Provider/Extender:Masami Plata, [name], [name] in Treatment: 1 Subjective History of Present Illness (HPI) ADMISSION 01/05/2019 This is a 73 year old woman with advanced COPD on chronic O2. She managed to strike her lower leg while getting out of a car on Thanksgiving day. She had a wound. The vent they tried to look after themselves at home eventually saw her primary doctor a week later. She was given Keflex and Neosporin. An x-ray was apparently done but I do not have these results. She was seen in the ER last night and given doxycycline this appointment was arranged. The patient has a 3.5 x 2 surface area of black eschar and wound. Swelling inferiorly suggestive of a hematoma. I see no current evidence of infection Past medical history type 2 diabetes recent hemoglobin A1c of 7.6 in April. Chronic COPD the on oxygen. 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 ABI in this clinic was 1.3 on the right 12/18; the necrotic surface of this hematoma area remained intact which is somewhat surprising. The swelling around the area has gotten better. She is still having some discomfort managed with as needed tramadol Objective Constitutional Sitting or standing Blood Pressure is within target range for patient.. Pulse regular and within target range for patient.Marland Kitchen Respirations regular, non-labored and within target range.. Temperature is normal and within the target range for the patient.Marland Kitchen Appears in no distress. Vitals Time Taken: 1:40 PM, Height: 62 in, Temperature: 98.5 F, Pulse: 59 bpm,  Respiratory Rate: 20 breaths/min, Blood Pressure: 112/55 mmHg, Capillary Blood Glucose: 130 mg/dl. Eyes Conjunctivae clear. No discharge.no icterus. Respiratory work of breathing is normal. Cardiovascular Pedal pulses palpable and strong bilaterally.. Much better edema control. Psychiatric appears at normal baseline. General Notes: Wound exam; area on the right lateral calf. Tightly adherent black eschar over the wound surface is still there. This was not debrided. The swelling around the area is a lot better suggesting ongoing absorption which is what we want to see happen. There is no evidence of infection Integumentary (Hair, Skin) Tissue around the actual trauma area is still swollen but a lot less than last week.  No erythema no suggestion of cellulitis. Wound #1 status is Open. Original cause of wound was Trauma. The wound is located on the Right,Lateral Lower Leg. The wound measures 4.5cm length x 2.4cm width x 0.1cm depth; 8.482cm^2 area and 0.848cm^3 volume. There is no tunneling or undermining noted. There is a none present amount of drainage noted. The wound margin is flat and intact. There is no granulation within the wound bed. There is a large (67-100%) amount of necrotic tissue within the wound bed including Eschar. 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 There was a Three Layer Compression Therapy Procedure by Deon Pilling, RN. Post procedure Diagnosis Wound #1: Same as Pre-Procedure Plan Follow-up Appointments: Return Appointment in 2 weeks. - to see MD Nurse Visit: - next week Dressing Change Frequency: Do not change entire dressing for one week. Skin Barriers/Peri-Wound Care: Moisturizing lotion Wound Cleansing: May shower with  protection. - can use cast protector Primary Wound Dressing: Calcium Alginate with Silver Secondary Dressing: ABD pad Edema Control: 3 Layer Compression System - Right Lower Extremity Avoid standing for long periods of time Elevate legs to the level of the heart or above for 30 minutes daily and/or when sitting, a frequency of: 1. Continue silver alginate ABDs 3 layer compression to the right lower extremity 2. We went over this with the son/family member. I would like to keep this intact as long as possible. At some point debridement is going to be necessary but I would like to delay this. Electronic Signature(s) Signed: 01/12/2019 6:01:02 PM By: Linton Ham MD Entered By: Linton Ham on 01/12/2019 14:19:47 -------------------------------------------------------------------------------- SuperBill Details Patient Name: Date of Service: [name] 01/12/2019 Medical Record [id number] Patient Account Number: [id number] Date of Birth/Sex: Treating RN: 02-Oct-1945 (73 y.o. Clearnce Sorrel Primary Care Provider: Jillyn Ledger Other Clinician: Referring Provider: Treating Provider/Extender:Zadkiel Dragan, [name], [name] in Treatment: 1 Diagnosis Coding ICD-10 Codes Code Description [id number] Non-pressure chronic ulcer of right calf with other specified severity S80.11XD Contusion of right lower leg, subsequent encounter Facility Procedures CPT4 Code Description: [id number] (Facility Use Only) [phone] - APPLY MULTLAY COMPRS LWR RT LEG Modifier: Quantity: 1 Physician Procedures CPT4 Code Description: [id number] [place] - WC PHYS LEVEL 3 - EST PT ICD-10 Diagnosis Description [id number] Non-pressure chronic ulcer of right calf with other specifi S80.11XD Contusion of right lower leg, subsequent encounter Modifier: ed severity Quantity: 1 Electronic Signature(s) Signed: 01/12/2019 6:01:02 PM By: Linton Ham MD Entered By: Linton Ham on 01/12/2019 14:20:10

## 2019-01-12 NOTE — Progress Notes (Addendum)
[name], [name] ([id number]) Visit Report for 01/12/2019 Arrival Information Details Patient Name: Date of Service: [name], [name] 01/12/2019 1:00 PM Medical Record [id number] Patient Account Number: [id number] Date of Birth/Sex: Treating RN: 1945-10-20 (73 y.o. [name], [name] Primary Care Ashby Leflore: BRAKE, Mitzi Hansen Other Clinician: Referring Emonte Dieujuste: Treating Chasya Zenz/Extender:Robson, [name], [name] in Treatment: 1 Visit Information History Since Last Visit Added or deleted any medications: No Patient Arrived: Wheel Chair Any new allergies or adverse reactions: No Arrival Time: 13:40 Had a fall or experienced change in No activities of daily living that may affect Accompanied By: son risk of falls: Transfer Assistance: None Signs or symptoms of abuse/neglect since last No Patient Identification Verified: Yes visito Secondary Verification Process Completed: Yes Hospitalized since last visit: No Patient Requires Transmission-Based No Implantable device outside of the clinic excluding No Precautions: cellular tissue based products placed in the center Patient Has Alerts: No since last visit: Has Dressing in Place as Prescribed: Yes Has Compression in Place as Prescribed: Yes Pain Present Now: Yes Electronic Signature(s) Signed: 01/12/2019 5:50:42 PM By: Deon Pilling Entered By: Deon Pilling on 01/12/2019 13:45:32 -------------------------------------------------------------------------------- Compression Therapy Details Patient Name: Date of Service: [name] 01/12/2019 1:00 PM Medical Record [id number] Patient Account Number: [id number] Date of Birth/Sex: Treating RN: 05/03/1945 (73 y.o. [name] Primary Care Jacoya Bauman: Jillyn Ledger Other Clinician: Referring Samarth Ogle: Treating Elsie Sakuma/Extender:Robson, [name], [name] in Treatment: 1 Compression Therapy Performed for Wound Wound #1 Right,Lateral  Lower Leg Assessment: Performed By: Clinician Deon Pilling, RN Compression Type: Three Layer Post Procedure Diagnosis Same as Pre-procedure Electronic Signature(s) Signed: 01/12/2019 5:42:17 PM By: Kela Millin Entered By: Kela Millin on 01/12/2019 14:08:27 -------------------------------------------------------------------------------- Encounter Discharge Information Details Patient Name: Date of Service: [name] 01/12/2019 1:00 PM Medical Record [id number] Patient Account Number: [id number] Date of Birth/Sex: Treating RN: 1945/07/30 (73 y.o. [name] Primary Care Kollins Fenter: Jillyn Ledger Other Clinician: Referring Baley Lorimer: Treating Dillin Lofgren/Extender:Robson, [name], [name] in Treatment: 1 Encounter Discharge Information Items Discharge Condition: Stable Ambulatory Status: Wheelchair Discharge Destination: Emergency Room Telephoned: Yes Spoke With: Charge Nurse Orders Sent: No Transportation: Other Accompanied By: son Schedule Follow-up Appointment: Yes Clinical Summary of Care: Notes 1430- Patient stood from chair and walked to wheelchair. Son states, "I think mom's sugar is dropping. We got here early the bus picked [place] up too early. I gave her 12 units of rapid insulin, but we did not have time to eat anything the bus pulled up." Patient noted to have stronger tremors than her normal. Upon observation she was pale and lethargic. checked blood glucose 28. MD made aware. Patient was able to drink a glucerna shake. Patient drank the entire shake. 1450- Patient talking but still lethargic. Rechecked blood glucose 27. MD made aware. 2nd glucerna shake, x2 glucose tabs, and x2 packs graham crackers given. Patient drank and ate the entire glucose tabs and crackers. MD made aware. Explained to patient and son patient will need to go to the ED for evaluation if sugar does not increase. 1508- Blood glucose recheck 43. Patient less  lethargic and states does not want to go to ED related to taking forever to be seen. Explained to patient the ED staff would take her right back due to her critical status. Patient request something else to increase sugar. Carrot cake and a glass of sweet tea provided. MD made aware. Per MD send patient to ED in 15 minutes after recheck if not increase over 70. 1525- patient's color is  reading, patient tremors down to her norm, but surgar recheck noted only 49. MD made aware and send to send to ED. Patient in agreement to go. ED charge nurse phoned and made aware of patient's status. Patient was transported via wheelchair to ED by [employer] Tech/paramedic along with her son. Electronic Signature(s) Signed: 01/12/2019 5:50:42 PM By: Deon Pilling Entered By: Deon Pilling on 01/12/2019 17:50:29 -------------------------------------------------------------------------------- Lower Extremity Assessment Details Patient Name: Date of Service: [name] 01/12/2019 1:00 PM Medical Record [id number] Patient Account Number: [id number] Date of Birth/Sex: Treating RN: 1945/08/01 (73 y.o. [name] Primary Care Wandra Babin: Jillyn Ledger Other Clinician: Referring Sadi Arave: Treating Emillie Chasen/Extender:Robson, [name], [name] in Treatment: 1 Edema Assessment Assessed: [Left: No] [Right: Yes] Edema: [Left: Ye] [Right: s] Calf Left: Right: Point of Measurement: 28 cm From Medial Instep cm 43 cm Ankle Left: Right: Point of Measurement: 10 cm From Medial Instep cm 21.5 cm Electronic Signature(s) Signed: 01/12/2019 5:50:42 PM By: Deon Pilling Entered By: Deon Pilling on 01/12/2019 13:41:32 -------------------------------------------------------------------------------- Multi Wound Chart Details Patient Name: Date of Service: [name] 01/12/2019 1:00 PM Medical Record [id number] Patient Account Number: [id number] Date of Birth/Sex: Treating  RN: 12-Jul-1945 (73 y.o. F) Primary Care Jayshawn Colston: BRAKE, ANDREW Other Clinician: Referring Kataya Guimont: Treating Samaia Iwata/Extender:Robson, [name], [name] in Treatment: 1 Vital Signs Height(in): 62 Capillary Blood 130 Glucose(mg/dl): Weight(lbs): Pulse(bpm): 10 Body Mass Index(BMI): Blood Pressure(mmHg): 112/55 Temperature(F): 98.5 Respiratory 20 Rate(breaths/min): Photos: [1:No Photos] [N/A:N/A] Wound Location: [1:Right Lower Leg - Lateral] [N/A:N/A] Wounding Event: [1:Trauma] [N/A:N/A] Primary Etiology: [1:Venous Leg Ulcer] [N/A:N/A] Comorbid History: [1:Glaucoma, Chronic Obstructive Pulmonary Disease (COPD), Hypertension, Type II Diabetes, Osteoarthritis, Osteomyelitis, Neuropathy, Confinement Anxiety] [N/A:N/A] Date Acquired: [1:12/21/2018] [N/A:N/A] Weeks of Treatment: [1:1] [N/A:N/A] Wound Status: [1:Open] [N/A:N/A] Measurements L x W x D 4.5x2.4x0.1 [N/A:N/A] (cm) Area (cm) : [1:8.482] [N/A:N/A] Volume (cm) : [1:0.848] [N/A:N/A] % Reduction in Area: [1:-54.30%] [N/A:N/A] % Reduction in Volume: -54.20% [N/A:N/A] Classification: [1:Unclassifiable] [N/A:N/A] Exudate Amount: [1:None Present] [N/A:N/A] Wound Margin: [1:Flat and Intact] [N/A:N/A] Granulation Amount: [1:None Present (0%)] [N/A:N/A] Necrotic Amount: [1:Large (67-100%)] [N/A:N/A] Necrotic Tissue: [1:Eschar] [N/A:N/A] Exposed Structures: [1:Fascia: No Fat Layer (Subcutaneous Tissue) Exposed: No Tendon: No Muscle: No Joint: No Bone: No] [N/A:N/A] Epithelialization: [1:None] [N/A:N/A N/A] Treatment Notes Electronic Signature(s) Signed: 01/12/2019 6:01:02 PM By: Linton Ham MD Entered By: Linton Ham on 01/12/2019 14:17:00 -------------------------------------------------------------------------------- Multi-Disciplinary Care Plan Details Patient Name: Date of Service: [name]. 01/12/2019 1:00 PM Medical Record [id number] Patient Account Number: [id number] Date of  Birth/Sex: Treating RN: [birth year] (73 y.o. F) Kela Millin Primary Care Lexxus Underhill: Jillyn Ledger Other Clinician: Referring Keyauna Graefe: Treating London Tarnowski/Extender:Robson, [name], [name] in Treatment: 1 Active Inactive Orientation to the Wound Care Program Nursing Diagnoses: Knowledge deficit related to the wound healing center program Goals: Patient/caregiver will verbalize understanding of the [place] Program Date Initiated: 01/05/2019 Target Resolution Date: 02/02/2019 Goal Status: Active Interventions: Provide education on orientation to the wound center Notes: Wound/Skin Impairment Nursing Diagnoses: Impaired tissue integrity Goals: Ulcer/skin breakdown will have a volume reduction of 30% by week 4 Date Initiated: 01/05/2019 Target Resolution Date: 02/02/2019 Goal Status: Active Interventions: Provide education on smoking Provide education on ulcer and skin care Notes: Electronic Signature(s) Signed: 01/12/2019 5:42:17 PM By: Kela Millin Entered By: Kela Millin on 01/12/2019 14:09:22 -------------------------------------------------------------------------------- Pain Assessment Details Patient Name: Date of Service: [name] 01/12/2019 1:00 PM Medical Record [id number] Patient Account Number: [id number] Date of Birth/Sex: Treating RN: 1945/07/22 (73 y.o. [name] Primary Care  Iram Astorino: Tree surgeon Other Clinician: Referring Othmar Ringer: Treating Shikita Vaillancourt/Extender:Robson, [name], [name] in Treatment: 1 Active Problems Location of Pain Severity and Description of Pain Patient Has Paino Yes Site Locations Pain Location: Pain in Ulcers Duration of the Pain. Constant / Intermittento Intermittent Rate the pain. Current Pain Level: 9 Worst Pain Level: 10 Least Pain Level: 1 Tolerable Pain Level: 8 Character of Pain Describe the Pain: Sharp Pain Management and Medication Current Pain  Management: Medication: Yes Cold Application: No Rest: Yes Massage: No Activity: No T.E.N.S.: No Heat Application: No Leg drop or elevation: No Is the Current Pain Management Adequate: Adequate How does your wound impact your activities of daily livingo Sleep: No Bathing: No Appetite: No Relationship With Others: No Bladder Continence: No Emotions: No Bowel Continence: No Work: No Toileting: No Drive: No Dressing: No Hobbies: No Electronic Signature(s) Signed: 01/12/2019 5:50:42 PM By: Deon Pilling Entered By: Deon Pilling on 01/12/2019 13:46:31 -------------------------------------------------------------------------------- Patient/Caregiver Education Details Patient Name: Date of Service: Brule, Geet A. 12/18/2020andnbsp1:00 PM Medical Record [phone] Patient Account Number: [id number] Date of Birth/Gender: Treating RN: [birth year] (73 y.o. [name] Primary Care Physician: Jillyn Ledger Other Clinician: Referring Physician: Treating Physician/Extender:Robson, [name], [name] in Treatment: 1 Education Assessment Education Provided To: Patient Education Topics Provided Smoking and Wound Healing: Methods: Explain/Verbal Responses: State content correctly Welcome To [facility] [place]: Methods: Explain/Verbal Responses: State content correctly Wound/Skin Impairment: Methods: Explain/Verbal Responses: State content correctly Electronic Signature(s) Signed: 01/12/2019 5:42:17 PM By: Kela Millin Entered By: Kela Millin on 01/12/2019 14:09:45 -------------------------------------------------------------------------------- Wound Assessment Details Patient Name: Date of Service: [name] 01/12/2019 1:00 PM Medical Record [id number] Patient Account Number: [id number] Date of Birth/Sex: Treating RN: 1946-01-06 (73 y.o. [name] Primary Care Hildreth Orsak: BRAKE, Mitzi Hansen Other  Clinician: Referring Dalaina Tates: Treating Lesslie Mossa/Extender:Robson, [name], [name] in Treatment: 1 Wound Status Wound Number: 1 Primary Venous Leg Ulcer Etiology: Wound Location: Right Lower Leg - Lateral Wound Open Wounding Event: Trauma Status: Date Acquired: 12/21/2018 Comorbid Glaucoma, Chronic Obstructive Pulmonary Weeks Of Treatment: 1 History: Disease (COPD), Hypertension, Type II Clustered Wound: No Diabetes, Osteoarthritis, Osteomyelitis, Neuropathy, Confinement Anxiety Photos Wound Measurements Length: (cm) 4.5 Width: (cm) 2.4 Depth: (cm) 0.1 Area: (cm) 8.482 Volume: (cm) 0.848 Wound Description Classification: Unclassifiable Wound Margin: Flat and Intact Exudate Amount: None Present Wound Bed Granulation Amount: None Present (0%) Necrotic Amount: Large (67-100%) Necrotic Quality: Eschar Foul Odor After Cleansing: N Slough/Fibrino N Exposed Structure Fascia Exposed: Fat Layer (Subcutaneous Tissue) Exposed: Tendon Exposed: Muscle Exposed: Joint Exposed: Bone Exposed: % Reduction in Area: -54.3% % Reduction in Volume: -54.2% Epithelialization: None Tunneling: No Undermining: No o o No No No No No No Treatment Notes Wound #1 (Right, Lateral Lower Leg) 1. Cleanse With Wound Cleanser Soap and water 2. Periwound Care Moisturizing lotion 3. Primary Dressing Applied Calcium Alginate Ag 4. Secondary Dressing ABD Pad 6. Support Layer Applied 3 layer compression wrap Notes netting. Electronic Signature(s) Signed: 01/15/2019 6:06:17 PM By: Mikeal Hawthorne EMT/HBOT Signed: 01/16/2019 5:14:15 PM By: Deon Pilling Previous Signature: 01/12/2019 5:50:42 PM Version By: Deon Pilling Entered By: Mikeal Hawthorne on 01/15/2019 17:14:17 -------------------------------------------------------------------------------- Vitals Details Patient Name: Date of Service: [name] 01/12/2019 1:00 PM Medical Record [id number] Patient  Account Number: [id number] Date of Birth/Sex: Treating RN: 04/18/1945 (73 y.o. [name] Primary Care Clementina Mareno: Jillyn Ledger Other Clinician: Referring Britt Theard: Treating Seanmichael Salmons/Extender:Robson, [name], [name] in Treatment: 1 Vital Signs Time Taken: 13:40 Temperature (F): 98.5 Height (in): 62 Pulse (  bpm): 59 Respiratory Rate (breaths/min): 20 Blood Pressure (mmHg): 112/55 Capillary Blood Glucose (mg/dl): 130 Reference Range: 80 - 120 mg / dl Electronic Signature(s) Signed: 01/12/2019 5:50:42 PM By: Deon Pilling Entered By: Deon Pilling on 01/12/2019 13:46:03

## 2019-01-12 NOTE — ED Provider Notes (Signed)
[facility] DEPT Provider Note   CSN: [id number] Arrival date & time: 01/12/19  1548     History Chief Complaint  Patient presents with  . Hypoglycemia    [name] is a 73 y.o. female.  The history is provided by the patient and medical records. No language interpreter was used.  Hypoglycemia Initial blood sugar:  28 Blood sugar after intervention:  63 Severity:  Severe Onset quality:  Gradual Duration:  1 day Timing:  Rare Progression:  Improving Chronicity:  New Diabetic status:  Controlled with insulin and controlled with oral medications Context: decreased oral intake   Relieved by:  Nothing Ineffective treatments:  None tried Associated symptoms: altered mental status (fatigue) and sweats        Past Medical History:  Diagnosis Date  . Anxiety   . Arthritis   . Cataract   . COPD (chronic obstructive pulmonary disease) ([place])   . Depression   . Diabetes mellitus without complication ([place])   . Glaucoma   . Headache    "migraines im my 20's"  . Hyperlipidemia   . Hypertension   . Neuromuscular disorder ([place])   . Osteomyelitis ([place])    right second toe  . Osteoporosis   . Schizophrenia ([place])    schizoaffective  . Substance abuse [facility])     Patient Active Problem List   Diagnosis Date Noted  . Pain in right shoulder 08/02/2018  . Idiopathic chronic venous hypertension of both lower extremities with inflammation 06/07/2017  . Ulcer of toe of left foot, limited to breakdown of skin ([place]) 06/07/2017  . History of amputation of lesser toe of right foot ([place]) 11/05/2016  . Diabetic polyneuropathy associated with type 2 diabetes mellitus ([place]) 10/05/2016  . Fracture of one rib, left side, initial encounter for closed fracture 01/22/2016  . Postinflammatory pulmonary fibrosis ([place]) 09/21/2015  . COPD mixed type ([place]) 09/18/2015  . FREQUENCY, URINARY 07/10/2007  . 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 Smoker 02/23/2007   . Essential hypertension 02/23/2007  . DIAB W/O MENTION COMP TYPE II/UNS TYPE UNCNTRL 01/20/2007  . HYPERLIPIDEMIA 01/20/2007  . HEADACHE 01/20/2007    Past Surgical History:  Procedure Laterality Date  . AMPUTATION Right 10/29/2016   Procedure: Right Foot 2nd Toe Amputation at Metatarsophalangeal Joint;  Surgeon: Newt Minion, MD;  Location: [facility];  Service: Orthopedics;  Laterality: Right;  . EYE SURGERY    . KNEE SURGERY    . MULTIPLE TOOTH EXTRACTIONS    . VASCULAR SURGERY     vein surgery     OB History   No obstetric history on file.     Family History  Problem Relation Age of Onset  . Heart disease Father     Social History   Tobacco Use  . Smoking status: Current Every Day Smoker    Packs/day: 2.00    Years: 53.00    Pack years: 106.00    Types: Cigarettes  . Smokeless tobacco: Never Used  Substance Use Topics  . Alcohol use: No    Alcohol/week: 0.0 standard drinks  . Drug use: No    Home Medications Prior to Admission medications   Medication Sig Start Date End Date Taking? Authorizing Provider  albuterol (PROVENTIL HFA;VENTOLIN HFA) 108 (90 Base) MCG/ACT inhaler Inhale 2 puffs into the lungs every 6 (six) hours as needed for wheezing or shortness of breath.    [provider]  amLODipine (NORVASC) 10 MG tablet Take 10 mg by mouth daily.  [provider]  aspirin EC 81 MG tablet Take 81 mg by mouth daily.    [provider]  atorvastatin (LIPITOR) 20 MG tablet Take 20 mg by mouth daily.    [provider]  BD PEN NEEDLE NANO U/F 32G X 4 MM MISC  12/23/15   [provider]  budesonide-formoterol (SYMBICORT) 160-4.5 MCG/ACT inhaler Inhale 2 puffs into the lungs 2 (two) times daily.    [provider]  cephALEXin (KEFLEX) 500 MG capsule Take 1 capsule (500 mg total) by mouth 4 (four) times daily. 01/04/19   [name], MD  chlorproMAZINE (THORAZINE) 100 MG tablet Take 100 mg by mouth at bedtime.      [provider]  doxycycline (VIBRAMYCIN) 100 MG capsule Take 1 capsule (100 mg total) by mouth 2 (two) times daily. 10/22/17   Wurst, [place], PA-C  doxycycline (VIBRAMYCIN) 100 MG capsule Take 1 capsule (100 mg total) by mouth 2 (two) times daily. 01/04/19   [name], MD  glipiZIDE (GLUCOTROL XL) 10 MG 24 hr tablet Take 10 mg by mouth daily. 01/04/18   [provider]  hydrOXYzine (ATARAX/VISTARIL) 10 MG tablet Take 10 mg by mouth 2 (two) times daily.     [provider]  ibuprofen (ADVIL,MOTRIN) 400 MG tablet Take 1 tablet (400 mg total) by mouth every 6 (six) hours as needed for moderate pain. 10/22/17   Wurst, [place], PA-C  Insulin Glargine (BASAGLAR KWIKPEN) 100 UNIT/ML SOPN Inject 35 Units into the skin at bedtime.     [provider]  levofloxacin (LEVAQUIN) 500 MG tablet Take 1 tablet (500 mg total) by mouth daily. Patient not taking: Reported on 01/04/2019 04/10/18   [name], MD  LUMIGAN 0.01 % SOLN Place 1 drop into both eyes at bedtime.  11/09/18   [provider]  metFORMIN (GLUCOPHAGE) 1000 MG tablet Take 1,000 mg by mouth 2 (two) times daily with a meal.     [provider]  metFORMIN (GLUCOPHAGE-XR) 750 MG 24 hr tablet Take 750 mg by mouth daily with breakfast.    [provider]  morphine (MSIR) 15 MG tablet Take 0.5 tablets (7.5 mg total) by mouth every 6 (six) hours as needed for moderate pain or severe pain. Patient not taking: Reported on 01/04/2019 02/27/18   Davonna Belling, MD  NOVOLOG FLEXPEN 100 UNIT/ML FlexPen Inject 12-15 Units into the skin 3 (three) times daily with meals.  11/15/15   [provider]  pregabalin (LYRICA) 100 MG capsule Take 100 mg by mouth 3 (three) times daily.    [provider]  sertraline (ZOLOFT) 100 MG tablet Take 100 mg by mouth daily.    [provider]  topiramate (TOPAMAX) 50 MG tablet Take 50 mg by mouth daily.  01/03/16   [provider]  traMADol (ULTRAM) 50 MG tablet Take 1 tablet (50 mg total) by mouth every 12 (twelve) hours as needed for moderate pain. 08/02/18   Suzan Slick, NP  valsartan (DIOVAN) 160 MG tablet Take 160 mg by mouth daily.    [provider]  vitamin C (ASCORBIC ACID) 500 MG tablet Take 500 mg by mouth daily as needed (cold prevention).    [provider]    Allergies    Sitagliptin phosphate  Review of Systems   Review of Systems  Constitutional: Positive for diaphoresis and fatigue. Negative for chills and fever.  HENT: Negative for congestion.   Respiratory: Negative for cough.   Cardiovascular: Negative  for chest pain.  Gastrointestinal: Negative for abdominal pain.  Genitourinary: Negative for dysuria and flank pain.  Musculoskeletal: Negative for back pain.  Neurological: Negative for headaches.  Psychiatric/Behavioral: Negative for agitation and confusion.  All other systems reviewed and are negative.   Physical Exam Updated Vital Signs BP (!) 149/59 (BP Location: Left Arm)   Pulse 64   Temp 97.9 F (36.6 C) (Oral)   SpO2 100%   Physical Exam Vitals and nursing note reviewed.  Constitutional:      General: She is not in acute distress.    Appearance: She is well-developed. She is not ill-appearing, toxic-appearing or diaphoretic.  HENT:     Head: Normocephalic and atraumatic.     Nose: Nose normal. No congestion or rhinorrhea.     Mouth/Throat:     Mouth: Mucous membranes are moist.  Eyes:     Conjunctiva/sclera: Conjunctivae normal.     Pupils: Pupils are equal, round, and reactive to light.  Cardiovascular:     Rate and Rhythm: Normal rate and regular rhythm.     Pulses: Normal pulses.     Heart sounds: No murmur.  Pulmonary:     Effort: Pulmonary effort is normal. No respiratory distress.     Breath sounds: Normal breath sounds. No wheezing, rhonchi or rales.  Chest:     Chest wall: No tenderness.  Abdominal:     General: Abdomen  is flat.     Palpations: Abdomen is soft.     Tenderness: There is no abdominal tenderness. There is no right CVA tenderness or left CVA tenderness.  Musculoskeletal:        General: No tenderness.     Cervical back: Neck supple. No tenderness.  Skin:    General: Skin is warm and dry.     Capillary Refill: Capillary refill takes less than 2 seconds.     Findings: No erythema.  Neurological:     General: No focal deficit present.     Mental Status: She is alert.     Sensory: No sensory deficit.     Motor: No weakness.  Psychiatric:        Mood and Affect: Mood normal.     ED Results / Procedures / Treatments   Labs (all labs ordered are listed, but only abnormal results are displayed) Labs Reviewed  CBC WITH DIFFERENTIAL/PLATELET - Abnormal; Notable for the following components:      Result Value   WBC 13.2 (*)    Neutro Abs 9.1 (*)    Monocytes Absolute 1.3 (*)    Abs Immature Granulocytes 0.20 (*)    All other components within normal limits  COMPREHENSIVE METABOLIC PANEL - Abnormal; Notable for the following components:   Potassium 5.3 (*)    BUN 24 (*)    All other components within normal limits  CBG MONITORING, ED - Abnormal; Notable for the following components:   Glucose-Capillary 64 (*)    All other components within normal limits  CBG MONITORING, ED - Abnormal; Notable for the following components:   Glucose-Capillary 182 (*)    All other components within normal limits  URINE CULTURE  LACTIC ACID, PLASMA  LACTIC ACID, PLASMA  URINALYSIS, ROUTINE W REFLEX MICROSCOPIC    EKG None  Radiology DG Chest Portable 1 View  Result Date: 01/12/2019 CLINICAL DATA:  Fatigue, hypoglycemia. EXAM: PORTABLE CHEST 1 VIEW COMPARISON:  April 10, 2018. FINDINGS: The heart size and mediastinal contours are within normal limits. Both lungs are clear. The  visualized skeletal structures are unremarkable. IMPRESSION: No active disease. Electronically Signed   By: Marijo Conception  M.D.   On: 01/12/2019 16:37    Procedures Procedures (including critical care time)  CRITICAL CARE Performed by: Gwenyth Allegra Pharell Rolfson Total critical care time: 35 minutes Critical care time was exclusive of separately billable procedures and treating other patients. Critical care was necessary to treat or prevent imminent or life-threatening deterioration. Hypoglycemia with glucose in the 20s. Critical care was time spent personally by me on the following activities: development of treatment plan with patient and/or surrogate as well as nursing, discussions with consultants, evaluation of patient's response to treatment, examination of patient, obtaining history from patient or surrogate, ordering and performing treatments and interventions, ordering and review of laboratory studies, ordering and review of radiographic studies, pulse oximetry and re-evaluation of patient's condition.   Medications Ordered in ED Medications  dextrose 50 % solution 50 mL (50 mLs Intravenous Given 01/12/19 1627)    ED Course  I have reviewed the triage vital signs and the nursing notes.  Pertinent labs & imaging results that were available during my care of the patient were reviewed by me and considered in my medical decision making (see chart for details).    MDM Rules/Calculators/A&P                      MADILINE EUSTICE is a 73 y.o. female with a past medical history significant for COPD, hypertension, diabetes, hyperlipidemia, and right leg wound who presents for hypoglycemia and fatigue.  According to patient and family, patient took her medication this morning including 34 units of Lantus, Metformin, glipizide, and 12 units of NovoLog around 145 this morning.  They were going to eat breakfast but then the bus came and they had to catch it to get to her doctor's appointment.  She was going to the wound clinic to assess her leg.  She was able to go to the clinic visit and they told her her wound was  looking good with no new infection or problems but she began feeling more confused and fatigued and was found to have a glucose of 28.  8 2 Glucerna shakes, graham crackers, 2 glucose tablets, some cake and tea but her glucose only improved into the 40s and in the 60s respectively on arrival to the emergency department.  Patient is still feeling fatigued and tired.  She otherwise has not had no fevers, chills, exertion, cough, GI symptoms or urinary symptoms.  She denies any current pain.  She denies any confusion but does feel tired.  She denies recent trauma.  Husband suspects that this is all due to too much of her glucose medications without eating or drinking.  He does note that typically after Glucerna shakes, her glucose normalizes.  On exam, lungs are clear and chest is nontender.  Abdomen is nontender.  Patient is resting comfortably and is on oxygen supplementation.  Patient moving all extremities.  Pupils are symmetric and reactive and I did not find any focal neurologic deficits.  Patient has a well dressed wound on the right leg which she was informed by wound team does not appear infected and looks like it is healing well.  As it was seen by her specialist today, will not take the dressing down at this time.  Clinically I suspect patient took too much insulin and did not eat.  However, as this is worse than she would typically have by report,  we will check for other occult infection.  Will get chest x-ray, urine, and labs.  We will give the patient D50 and monitor glucose closely.  If work-up is reassuring and her glucose stabilizes, anticipate discharge home when she is feeling better.  Care transferred to oncoming team while awaiting results of diagnostic work-up and glucose monitoring.   Final Clinical Impression(s) / ED Diagnoses Final diagnoses:  Hypoglycemia  Fatigue, unspecified type    Clinical Impression: 1. Hypoglycemia   2. Fatigue, unspecified type     Disposition:  Care transferred to oncoming team while awaiting results of diagnostic work-up and glucose monitoring.  This note was prepared with assistance of Systems analyst. Occasional wrong-word or sound-a-like substitutions may have occurred due to the inherent limitations of voice recognition software.      Der Gagliano, Gwenyth Allegra, MD 01/12/19 1726

## 2019-01-14 LAB — URINE CULTURE: Culture: NO GROWTH

## 2019-01-15 LAB — GLUCOSE, CAPILLARY: Glucose-Capillary: 24 mg/dL — CL (ref 70–99)

## 2019-01-17 ENCOUNTER — Encounter (HOSPITAL_BASED_OUTPATIENT_CLINIC_OR_DEPARTMENT_OTHER): Payer: Medicare Other | Admitting: Physician Assistant

## 2019-01-17 ENCOUNTER — Other Ambulatory Visit: Payer: Self-pay

## 2019-01-17 DIAGNOSIS — E11622 Type 2 diabetes mellitus with other skin ulcer: Secondary | ICD-10-CM | POA: Diagnosis not present

## 2019-01-18 NOTE — Progress Notes (Signed)
[name], [name] ([id number]) Visit Report for 01/17/2019 Arrival Information Details Patient Name: Date of Service: [name], [name] 01/17/2019 3:30 PM Medical Record [id number] Patient Account Number: [id number] Date of Birth/Sex: Treating RN: 07-10-1945 (73 y.o. [name] Primary Care Savaya Hakes: BRAKE, ANDREW Other Clinician: Referring Callaghan Laverdure: Treating Camylle Whicker/Extender:Stone III, Lyndee Hensen, [name] in Treatment: 1 Visit Information History Since Last Visit All ordered tests and consults were completed: No Patient Arrived: Wheel Chair Added or deleted any medications: No Arrival Time: 16:08 Any new allergies or adverse reactions: No Accompanied By: son Had a fall or experienced change in No activities of daily living that may affect Transfer Assistance: Manual risk of falls: Patient Identification Verified: Yes Signs or symptoms of abuse/neglect since last No Secondary Verification Process Completed: Yes visito Patient Requires Transmission-Based No Hospitalized since last visit: No Precautions: Implantable device outside of the clinic excluding No Patient Has Alerts: No cellular tissue based products placed in the center since last visit: Has Dressing in Place as Prescribed: Yes Has Compression in Place as Prescribed: Yes Pain Present Now: No Electronic Signature(s) Signed: 01/18/2019 11:24:03 AM By: Carlene Coria RN Entered By: Carlene Coria on 01/17/2019 16:08:47 -------------------------------------------------------------------------------- Compression Therapy Details Patient Name: Date of Service: [name] 01/17/2019 3:30 PM Medical Record [id number] Patient Account Number: [id number] Date of Birth/Sex: Treating RN: 1945-10-31 (73 y.o. [name] Primary Care Cauy Melody: Jillyn Ledger Other Clinician: Referring Vincent Streater: Treating Jsiah Menta/Extender:Stone III, Lyndee Hensen, [name] in Treatment: 1 Compression Therapy  Performed for Wound Wound #1 Right,Lateral Lower Leg Assessment: Performed By: Clinician Carlene Coria, RN Compression Type: Three Layer Electronic Signature(s) Signed: 01/18/2019 11:24:03 AM By: Carlene Coria RN Entered By: Carlene Coria on 01/17/2019 16:09:44 -------------------------------------------------------------------------------- Encounter Discharge Information Details Patient Name: Date of Service: [name] 01/17/2019 3:30 PM Medical Record [phone] Patient Account Number: [id number] Date of Birth/Sex: Treating RN: 04/26/1945 (73 y.o. [name] Primary Care Jacqulynn Shappell: Jillyn Ledger Other Clinician: Referring Melrose Kearse: Treating Merrily Tegeler/Extender:Stone III, Lyndee Hensen, [name] in Treatment: 1 Encounter Discharge Information Items Discharge Condition: Stable Ambulatory Status: Wheelchair Discharge Destination: Home Transportation: Private Auto Accompanied By: son Schedule Follow-up Appointment: Yes Clinical Summary of Care: Patient Declined Electronic Signature(s) Signed: 01/18/2019 11:24:03 AM By: Carlene Coria RN Entered By: Carlene Coria on 01/17/2019 16:10:34 -------------------------------------------------------------------------------- Patient/Caregiver Education Details Patient Name: Date of Service: [name] 12/23/2020andnbsp3:30 PM Medical Record Patient Account Number: [id number] Number: Treating RN: Carlene Coria Date of Birth/Gender: [birth year] (73 y.o. F) Other Clinician: Primary Care Physician: BRAKE, ANDREW Treating Worthy Keeler Referring Physician: Physician/Extender: Faythe Ghee in Treatment: 1 Education Assessment Education Provided To: Patient Education Topics Provided Welcome To [facility]: Methods: Explain/Verbal Responses: State content correctly Wound/Skin Impairment: Methods: Explain/Verbal Responses: State content correctly Electronic Signature(s) Signed:  01/18/2019 11:24:03 AM By: Carlene Coria RN Entered By: Carlene Coria on 01/17/2019 16:10:15 -------------------------------------------------------------------------------- Wound Assessment Details Patient Name: Date of Service: [name], [name] 01/17/2019 3:30 PM Medical Record [id number] Patient Account Number: [id number] Date of Birth/Sex: Treating RN: 04-Jun-1945 (73 y.o. [name] Primary Care Samer Dutton: Jillyn Ledger Other Clinician: Referring Thaddeaus Monica: Treating Takoda Siedlecki/Extender:Stone III, Lyndee Hensen, [name] in Treatment: 1 Wound Status Wound Number: 1 Primary Venous Leg Ulcer Etiology: Wound Location: Right Lower Leg - Lateral Wound Open Wounding Event: Trauma Status: Date Acquired: 12/21/2018 Comorbid Glaucoma, Chronic Obstructive Pulmonary Weeks Of Treatment: 1 History: Disease (COPD), Hypertension, Type II Clustered Wound: No Diabetes, Osteoarthritis, Osteomyelitis, Neuropathy, Confinement Anxiety Wound Measurements Length: (cm) 4.5 %  Reducti Width: (cm) 2.4 % Reducti Depth: (cm) 0.1 Epithelia Area: (cm) 8.482 Tunnelin Volume: (cm) 0.848 Undermin Wound Description Classification: Unclassifiable Foul Od Wound Margin: Flat and Intact Slough/ Exudate Amount: None Present Wound Bed Granulation Amount: None Present (0%) Necrotic Amount: Large (67-100%) Fascia E Necrotic Quality: Eschar Fat Laye Tendon E Muscle E Joint Ex Bone Exp Electronic Signature(s) Signed: 01/18/2019 11:24:03 AM By: Carlene Coria RN Entered By: Carlene Coria on 12/23/2 or After Cleansing: No Fibrino No Exposed Structure xposed: No r (Subcutaneous Tissue) Exposed: No xposed: No xposed: No posed: No osed: No 020 16:09:28 on in Area: -54.3% on in Volume: -54.2% lization: None g: No ing: No -------------------------------------------------------------------------------- Vitals Details Patient Name: Date of Service: [name], [name] 01/17/2019 3:30  PM Medical Record [id number] Patient Account Number: [id number] Date of Birth/Sex: Treating RN: 1945-12-04 (73 y.o. [name] Primary Care Eithel Ryall: BRAKE, ANDREW Other Clinician: Referring Dexter Sauser: Treating Lynsay Fesperman/Extender:Stone III, Lyndee Hensen, [name] in Treatment: 1 Vital Signs Time Taken: 16:04 Temperature (F): 98.4 Height (in): 62 Pulse (bpm): 71 Respiratory Rate (breaths/min): 18 Blood Pressure (mmHg): 132/67 Reference Range: 80 - 120 mg / dl Electronic Signature(s) Signed: 01/18/2019 11:24:03 AM By: Carlene Coria RN Entered By: Carlene Coria on 01/17/2019 16:09:12

## 2019-01-18 NOTE — Progress Notes (Signed)
CELISSE, GRIBBEN ([id number]) Visit Report for 01/17/2019 SuperBill Details Patient Name: Date of Service: [name], [name] 01/17/2019 Medical Record [id number] Patient Account Number: [id number] Date of Birth/Sex: Treating RN: January 18, 1946 (73 y.o. [name] Primary Care Provider: Jillyn Ledger Other Clinician: Referring Provider: Treating Provider/Extender:Stone III, Lyndee Hensen, [name] in Treatment: 1 Diagnosis Coding ICD-10 Codes Code Description [phone] Non-pressure chronic ulcer of right calf with other specified severity S80.11XD Contusion of right lower leg, subsequent encounter Facility Procedures CPT4 Code Description Modifier Quantity [id number] (Facility Use Only) [phone] - APPLY [place] 1 Electronic Signature(s) Signed: 01/17/2019 4:54:22 PM By: Worthy Keeler PA-C Signed: 01/18/2019 11:24:03 AM By: Carlene Coria RN Entered By: Carlene Coria on 01/17/2019 16:10:46

## 2019-01-24 ENCOUNTER — Other Ambulatory Visit: Payer: Self-pay

## 2019-01-24 ENCOUNTER — Encounter (HOSPITAL_BASED_OUTPATIENT_CLINIC_OR_DEPARTMENT_OTHER): Payer: Medicare Other | Admitting: Internal Medicine

## 2019-01-24 DIAGNOSIS — E11622 Type 2 diabetes mellitus with other skin ulcer: Secondary | ICD-10-CM | POA: Diagnosis not present

## 2019-01-24 NOTE — Progress Notes (Signed)
[name], [name] ([id number]) Visit Report for 01/24/2019 Debridement Details Patient Name: Date of Service: YEXALEN, HELMING 01/24/2019 3:30 PM Medical Record [id number] Patient Account Number: [id number] Date of Birth/Sex: Treating RN: May 10, 1945 (73 y.o. [name] Primary Care Provider: Jillyn Ledger Other Clinician: Referring Provider: Treating Provider/Extender:Mehar Sagen, Elissa Hefty, [name] in Treatment: 2 Debridement Performed for Wound #1 Right,Lateral Lower Leg Assessment: Performed By: Physician Tobi Bastos, MD Debridement Type: Debridement Severity of Tissue Pre Fat layer exposed Debridement: Level of Consciousness (Pre- Awake and Alert procedure): Pre-procedure Verification/Time Out Taken: Yes - 16:10 Start Time: 16:15 Pain Control: Lidocaine 4% Topical Solution Total Area Debrided (L x W): 4 (cm) x 2.5 (cm) = 10 (cm) Tissue and other material Viable, Non-Viable, Eschar, Slough, Subcutaneous, Slough debrided: Level: Skin/Subcutaneous Tissue Debridement Description: Excisional Instrument: Blade, Forceps Bleeding: Minimum Hemostasis Achieved: Pressure End Time: 16:25 Procedural Pain: 5 Post Procedural Pain: 3 Response to Treatment: Procedure was tolerated well Level of Consciousness Awake and Alert (Post-procedure): Post Debridement Measurements of Total Wound Length: (cm) 4 Width: (cm) 2.5 Depth: (cm) 1.4 Volume: (cm) 10.996 Character of Wound/Ulcer Post Requires Further Debridement Debridement: Severity of Tissue Post Debridement: Fat layer exposed Post Procedure Diagnosis Same as Pre-procedure Notes undermines from 3-5 o'clock 3.8 cm Electronic Signature(s) Signed: 01/24/2019 5:10:58 PM By: Tobi Bastos MD, MBA Signed: 01/24/2019 5:40:33 PM By: Baruch Gouty RN, BSN Entered By: Baruch Gouty on 01/24/2019 16:27:40 -------------------------------------------------------------------------------- HPI  Details Patient Name: Date of Service: [name] 01/24/2019 3:30 PM Medical Record [id number] Patient Account Number: [id number] Date of Birth/Sex: Treating RN: Jul 29, 1945 (73 y.o. F) Primary Care Provider: Jillyn Ledger Other Clinician: Referring Provider: Treating Provider/Extender:Shirell Struthers, Elissa Hefty, [name] in Treatment: 2 History of Present Illness HPI Description: ADMISSION 01/05/2019 This is a 73 year old woman with advanced COPD on chronic O2. She managed to strike her lower leg while getting out of a car on Thanksgiving day. She had a wound. The vent they tried to look after themselves at home eventually saw her primary doctor a week later. She was given Keflex and Neosporin. An x-ray was apparently done but I do not have these results. She was seen in the ER last night and given doxycycline this appointment was arranged. The patient has a 3.5 x 2 surface area of black eschar and wound. Swelling inferiorly suggestive of a hematoma. I see no current evidence of infection Past medical history type 2 diabetes recent hemoglobin A1c of 7.6 in April. Chronic COPD the on oxygen. 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 ABI in this clinic was 1.3 on the right 12/18; the necrotic surface of this hematoma area remained intact which is somewhat surprising. The swelling around the area has gotten better. She is still having some discomfort managed with as needed tramadol 12/30-Patient returns with dense looking eschar on top of the hematoma on the right lateral leg calf area. Patient is on O2 around-the-clock Electronic Signature(s) Signed: 01/24/2019 4:33:37 PM By: Tobi Bastos MD, MBA Entered By: Tobi Bastos on 01/24/2019 16:33:36 -------------------------------------------------------------------------------- Physical Exam  Details Patient Name: Date of Service: [name] 01/24/2019 3:30 PM Medical Record [id number] Patient Account Number: [id number] Date of Birth/Sex: Treating RN: [birth year] (73 y.o. F) Primary Care Provider: BRAKE, ANDREW Other Clinician: Referring Provider: Treating Provider/Extender:Jakyri Brunkhorst, Elissa Hefty, [name] in Treatment: 2 Constitutional alert and oriented x 3. sitting or standing blood pressure is within target range for patient.. supine blood  pressure is within target range for patient.. pulse regular and within target range for patient.Marland Kitchen respirations regular, non-labored and within target range for patient.Marland Kitchen temperature within target range for patient.. . . Well-nourished and well-hydrated in no acute distress. Notes The eschar covering the wound was removed with pickup forceps and scalpel, leading to fairly copious venous drainage from the underlying wound, some subcutaneous fat was also removed that was necrosed, after expressing the drainage the wound now was measured post debridement-1.5 cm straight in and 4.2 cm 5:00 tunneling was noted Patient tolerated procedure well, surrounding skin otherwise looks intact Electronic Signature(s) Signed: 01/24/2019 4:35:27 PM By: Tobi Bastos MD, MBA Entered By: Tobi Bastos on 01/24/2019 16:35:26 -------------------------------------------------------------------------------- Physician Orders Details Patient Name: Date of Service: [name] 01/24/2019 3:30 PM Medical Record [id number] Patient Account Number: [id number] Date of Birth/Sex: Treating RN: [birth year] (73 y.o. [name] Primary Care Provider: Jillyn Ledger Other Clinician: Referring Provider: Treating Provider/Extender:Jaylina Ramdass, Elissa Hefty, [name] in Treatment: 2 Verbal / Phone Orders: No Diagnosis Coding ICD-10 Coding Code Description [id number] Non-pressure chronic ulcer of right calf with other specified  severity S80.11XD Contusion of right lower leg, subsequent encounter Follow-up Appointments Return Appointment in 1 week. Dressing Change Frequency Do not change entire dressing for one week. Skin Barriers/Peri-Wound Care Moisturizing lotion Wound Cleansing May shower with protection. - can use cast protector Primary Wound Dressing Wound #1 Right,Lateral Lower Leg Calcium Alginate with Silver - pack lightly into undermining Secondary Dressing Wound #1 Right,Lateral Lower Leg ABD pad Edema Control 3 Layer Compression System - Right Lower Extremity Avoid standing for long periods of time Elevate legs to the level of the heart or above for 30 minutes daily and/or when sitting, a frequency of: Exercise regularly Electronic Signature(s) Signed: 01/24/2019 5:10:58 PM By: Tobi Bastos MD, MBA Signed: 01/24/2019 5:40:33 PM By: Baruch Gouty RN, BSN Entered By: Baruch Gouty on 01/24/2019 16:29:28 -------------------------------------------------------------------------------- Problem List Details Patient Name: Date of Service: [name] 01/24/2019 3:30 PM Medical Record [id number] Patient Account Number: [id number] Date of Birth/Sex: Treating RN: 23-Oct-1945 (73 y.o. [name] Primary Care Provider: Jillyn Ledger Other Clinician: Referring Provider: Treating Provider/Extender:Morganna Styles, Elissa Hefty, [name] in Treatment: 2 Active Problems ICD-10 Evaluated Encounter Code Description Active Date Today Diagnosis L97.218 Non-pressure chronic ulcer of right calf with other 01/05/2019 No Yes specified severity S80.11XD Contusion of right lower leg, subsequent encounter 01/05/2019 No Yes Inactive Problems Resolved Problems Electronic Signature(s) Signed: 01/24/2019 5:10:58 PM By: Tobi Bastos MD, MBA Signed: 01/24/2019 5:40:33 PM By: Baruch Gouty RN, BSN Entered By: Baruch Gouty on 01/24/2019  16:12:49 -------------------------------------------------------------------------------- Progress Note Details Patient Name: Date of Service: [name] 01/24/2019 3:30 PM Medical Record [id number] Patient Account Number: [id number] Date of Birth/Sex: Treating RN: [birth year] (73 y.o. F) Primary Care Provider: BRAKE, ANDREW Other Clinician: Referring Provider: Treating Provider/Extender:Gabbi Whetstone, Elissa Hefty, [name] in Treatment: 2 Subjective History of Present Illness (HPI) ADMISSION 01/05/2019 This is a 73 year old woman with advanced COPD on chronic O2. She managed to strike her lower leg while getting out of a car on Thanksgiving day. She had a wound. The vent they tried to look after themselves at home eventually saw her primary doctor a week later. She was given Keflex and Neosporin. An x-ray was apparently done but I do not have these results. She was seen in the ER last night and given doxycycline this appointment was arranged. The patient has a 3.5 x 2 surface area of black eschar and wound. Swelling  inferiorly suggestive of a hematoma. I see no current evidence of infection Past medical history type 2 diabetes recent hemoglobin A1c of 7.6 in April. Chronic COPD the on oxygen. 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 ABI in this clinic was 1.3 on the right 12/18; the necrotic surface of this hematoma area remained intact which is somewhat surprising. The swelling around the area has gotten better. She is still having some discomfort managed with as needed tramadol 12/30-Patient returns with dense looking eschar on top of the hematoma on the right lateral leg calf area. Patient is on O2 around-the-clock Objective Constitutional alert and oriented x 3. sitting or standing blood pressure is within target range for patient..  supine blood pressure is within target range for patient.. pulse regular and within target range for patient.Marland Kitchen respirations regular, non-labored and within target range for patient.Marland Kitchen temperature within target range for patient.. Well-nourished and well-hydrated in no acute distress. Vitals Time Taken: 3:38 PM, Height: 62 in, Temperature: 97.5 F, Pulse: 69 bpm, Respiratory Rate: 16 breaths/min, Blood Pressure: 118/48 mmHg, Capillary Blood Glucose: 119 mg/dl. General Notes: The eschar covering the wound was removed with pickup forceps and scalpel, leading to fairly copious venous drainage from the underlying wound, some subcutaneous fat was also removed that was necrosed, after expressing the drainage the wound now was measured post debridement-1.5 cm straight in and 4.2 cm 5:00 tunneling was noted Patient tolerated procedure well, surrounding skin otherwise looks intact Integumentary (Hair, Skin) Wound #1 status is Open. Original cause of wound was Trauma. The wound is located on the Right,Lateral Lower Leg. The wound measures 4cm length x 2.5cm width x 0.1cm depth; 7.854cm^2 area and 0.785cm^3 volume. There is no tunneling or undermining noted. There is a small amount of sanguinous drainage noted. The wound margin is flat and intact. There is no granulation within the wound bed. There is a large (67-100%) amount of necrotic tissue within the wound bed including Eschar. 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 There was a Excisional Skin/Subcutaneous Tissue Debridement with a total area of 10 sq cm performed by Tobi Bastos, MD. With the following instrument(s): Blade, and Forceps to remove Viable and  Non-Viable tissue/material. Material removed includes Eschar, Subcutaneous Tissue, and Slough after achieving pain control using Lidocaine 4% Topical Solution. No specimens were taken. A time out was conducted at 16:10, prior to the start of the procedure. A Minimum amount of bleeding was controlled with Pressure. The procedure was tolerated well with a pain level of 5 throughout and a pain level of 3 following the procedure. Post Debridement Measurements: 4cm length x 2.5cm width x 1.4cm depth; 10.996cm^3 volume. Character of Wound/Ulcer Post Debridement requires further debridement. Severity of Tissue Post Debridement is: Fat layer exposed. Post procedure Diagnosis Wound #1: Same as Pre-Procedure General Notes: undermines from 3-5 o'clock 3.8 cm. Pre-procedure diagnosis of Wound #1 is a Venous Leg Ulcer located on the Right,Lateral Lower Leg . There was a Three Layer Compression Therapy Procedure by Carlene Coria, RN. Post procedure Diagnosis Wound #1: Same as Pre-Procedure Plan Follow-up Appointments: Return Appointment in 1 week. Dressing Change Frequency: Do not change entire dressing for one week. Skin Barriers/Peri-Wound  Care: Moisturizing lotion Wound Cleansing: May shower with protection. - can use cast protector Primary Wound Dressing: Wound #1 Right,Lateral Lower Leg: Calcium Alginate with Silver - pack lightly into undermining Secondary Dressing: Wound #1 Right,Lateral Lower Leg: ABD pad Edema Control: 3 Layer Compression System - Right Lower Extremity Avoid standing for long periods of time Elevate legs to the level of the heart or above for 30 minutes daily and/or when sitting, a frequency of: Exercise regularly 1. Patient will have silver alginate dressing with packing into the tunnel as well as the wound base, 2. We will continue with 3 layer compression for the leg 3. Patient return to clinic next week Electronic Signature(s) Signed: 01/24/2019 4:36:10 PM By:  Tobi Bastos MD, MBA Entered By: Tobi Bastos on 01/24/2019 16:36:09 -------------------------------------------------------------------------------- SuperBill Details Patient Name: Date of Service: [name] 01/24/2019 Medical Record [phone] Patient Account Number: [id number] Date of Birth/Sex: Treating RN: Jul 19, 1945 (73 y.o. [name] Primary Care Provider: Jillyn Ledger Other Clinician: Referring Provider: Treating Provider/Extender:Simonne Boulos, Elissa Hefty, [name] in Treatment: 2 Diagnosis Coding ICD-10 Codes Code Description [phone] Non-pressure chronic ulcer of right calf with other specified severity S80.11XD Contusion of right lower leg, subsequent encounter Facility Procedures CPT4 Code Description: [id number] [facility] TISSUE 20 SQ CM/< ICD-10 Diagnosis Description [id number] Non-pressure chronic ulcer of right calf with other specifie Modifier: d severity Quantity: 1 Physician Procedures Electronic Signature(s) Signed: 01/24/2019 4:36:20 PM By: Tobi Bastos MD, MBA Entered By: Tobi Bastos on 01/24/2019 16:36:19

## 2019-01-25 NOTE — Progress Notes (Addendum)
[name], [name] ([id number]) Visit Report for 01/24/2019 Arrival Information Details Patient Name: Date of Service: [name], [name] 01/24/2019 3:30 PM Medical Record [id number] Patient Account Number: [id number] Date of Birth/Sex: Treating RN: 05/10/1945 (73 y.o. Nancy Fetter Primary Care Pakou Rainbow: BRAKE, ANDREW Other Clinician: Referring Joni Norrod: Treating Anastacia Reinecke/Extender:Madduri, Elissa Hefty, [name] in Treatment: 2 Visit Information History Since Last Visit Added or deleted any medications: No Patient Arrived: Wheel Chair Any new allergies or adverse reactions: No Arrival Time: 15:37 Had a fall or experienced change in No activities of daily living that may affect Accompanied By: son risk of falls: Transfer Assistance: None Signs or symptoms of abuse/neglect since last No Patient Identification Verified: Yes visito Secondary Verification Process Yes Hospitalized since last visit: No Completed: Implantable device outside of the clinic excluding No Patient Requires Transmission-Based No cellular tissue based products placed in the center Precautions: since last visit: Patient Has Alerts: No Has Dressing in Place as Prescribed: Yes Has Compression in Place as Prescribed: Yes Pain Present Now: No Electronic Signature(s) Signed: 01/25/2019 3:10:57 PM By: Levan Hurst RN, BSN Entered By: Levan Hurst on 01/24/2019 15:37:31 -------------------------------------------------------------------------------- Compression Therapy Details Patient Name: Date of Service: [name] 01/24/2019 3:30 PM Medical Record [id number] Patient Account Number: [id number] Date of Birth/Sex: Treating RN: 07-15-1945 (73 y.o. Elam Dutch Primary Care Nash Bolls: Jillyn Ledger Other Clinician: Referring Hannah Strader: Treating Maryagnes Carrasco/Extender:Madduri, Elissa Hefty, [name] in Treatment: 2 Compression Therapy Performed for Wound Wound #1  Right,Lateral Lower Leg Assessment: Performed By: Clinician Carlene Coria, RN Compression Type: Three Layer Post Procedure Diagnosis Same as Pre-procedure Electronic Signature(s) Signed: 01/24/2019 5:40:33 PM By: Baruch Gouty RN, BSN Entered By: Baruch Gouty on 01/24/2019 16:23:23 -------------------------------------------------------------------------------- Encounter Discharge Information Details Patient Name: Date of Service: [name] 01/24/2019 3:30 PM Medical Record [phone] Patient Account Number: [id number] Date of Birth/Sex: Treating RN: 12/27/1945 (73 y.o. [name] Primary Care Gayl Ivanoff: Jillyn Ledger Other Clinician: Referring Karla Pavone: Treating Jestine Bicknell/Extender:Madduri, Elissa Hefty, [name] in Treatment: 2 Encounter Discharge Information Items Post Procedure Vitals Discharge Condition: Stable Temperature (F): 97.5 Ambulatory Status: Wheelchair Pulse (bpm): 65 Discharge Destination: Home Respiratory Rate (breaths/min): 16 Transportation: Private Auto Blood Pressure (mmHg): 118/48 Accompanied By: son Schedule Follow-up Appointment: No Clinical Summary of Care: Patient Declined Electronic Signature(s) Signed: 01/24/2019 5:40:54 PM By: Carlene Coria RN Entered By: Carlene Coria on 01/24/2019 16:53:18 -------------------------------------------------------------------------------- Lower Extremity Assessment Details Patient Name: Date of Service: [name], [name] 01/24/2019 3:30 PM Medical Record [id number] Patient Account Number: [id number] Date of Birth/Sex: Treating RN: 1945-03-25 (73 y.o. Nancy Fetter Primary Care Coral Soler: BRAKE, ANDREW Other Clinician: Referring Lilie Vezina: Treating Sydnie Sigmund/Extender:Madduri, Elissa Hefty, [name] in Treatment: 2 Edema Assessment Assessed: [Left: No] [Right: No] Edema: [Left: Ye] [Right: s] Calf Left: Right: Point of Measurement: 28 cm From Medial Instep cm 39  cm Ankle Left: Right: Point of Measurement: 10 cm From Medial Instep cm 21 cm Vascular Assessment Pulses: Dorsalis Pedis Palpable: [Right:Yes] Electronic Signature(s) Signed: 01/25/2019 3:10:57 PM By: Levan Hurst RN, BSN Entered By: Levan Hurst on 01/24/2019 15:41:44 -------------------------------------------------------------------------------- [place] Details Patient Name: Date of Service: [name] 01/24/2019 3:30 PM Medical Record [id number] Patient Account Number: [id number] Date of Birth/Sex: Treating RN: 1945-10-01 (73 y.o. Elam Dutch Primary Care Rosy Estabrook: Jillyn Ledger Other Clinician: Referring Duell Holdren: Treating Lucinda Spells/Extender:Madduri, Elissa Hefty, [name] in Treatment: 2 Active Inactive Wound/Skin Impairment Nursing Diagnoses: Impaired tissue integrity Goals: Ulcer/skin breakdown will have a volume reduction of 30% by week 4 Date  Initiated: 01/05/2019 Target Resolution Date: 02/02/2019 Goal Status: Active Interventions: Provide education on smoking Provide education on ulcer and skin care Notes: Electronic Signature(s) Signed: 01/24/2019 5:40:33 PM By: Baruch Gouty RN, BSN Entered By: Baruch Gouty on 01/24/2019 16:16:02 -------------------------------------------------------------------------------- Pain Assessment Details Patient Name: Date of Service: [name] 01/24/2019 3:30 PM Medical Record [id number] Patient Account Number: [id number] Date of Birth/Sex: Treating RN: 1945/02/17 (73 y.o. Nancy Fetter Primary Care Italia Wolfert: Jillyn Ledger Other Clinician: Referring Hersh Minney: Treating Kasi Lasky/Extender:Madduri, Elissa Hefty, [name] in Treatment: 2 Active Problems Location of Pain Severity and Description of Pain Patient Has Paino No Site Locations Pain Management and Medication Current Pain Management: Electronic Signature(s) Signed: 01/25/2019 3:10:57 PM  By: Levan Hurst RN, BSN Entered By: Levan Hurst on 01/24/2019 15:41:08 -------------------------------------------------------------------------------- Patient/Caregiver Education Details Patient Name: Date of Service: [name] 12/30/2020andnbsp3:30 PM Medical Record Patient Account Number: [id number] Number: Treating RN: Baruch Gouty Date of Birth/Gender: 20-Sep-1945 (72 y.o. F) Other Clinician: Primary Care Physician: BRAKE, ANDREW Treating Tobi Bastos Referring Physician: Physician/Extender: Faythe Ghee in Treatment: 2 Education Assessment Education Provided To: Patient Education Topics Provided Wound/Skin Impairment: Methods: Explain/Verbal Responses: Reinforcements needed, State content correctly Electronic Signature(s) Signed: 01/24/2019 5:40:33 PM By: Baruch Gouty RN, BSN Entered By: Baruch Gouty on 01/24/2019 16:17:40 -------------------------------------------------------------------------------- Wound Assessment Details Patient Name: Date of Service: [name] 01/24/2019 3:30 PM Medical Record [id number] Patient Account Number: [id number] Date of Birth/Sex: Treating RN: 1945-09-05 (73 y.o. Nancy Fetter Primary Care Zuri Lascala: BRAKE, ANDREW Other Clinician: Referring Shabria Egley: Treating Katie Faraone/Extender:Madduri, Elissa Hefty, [name] in Treatment: 2 Wound Status Wound Number: 1 Primary Venous Leg Ulcer Etiology: Wound Location: Right Lower Leg - Lateral Wound Open Wounding Event: Trauma Status: Date Acquired: 12/21/2018 Comorbid Glaucoma, Chronic Obstructive Pulmonary Weeks Of Treatment: 2 History: Disease (COPD), Hypertension, Type II Clustered Wound: No Diabetes, Osteoarthritis, Osteomyelitis, Neuropathy, Confinement Anxiety Photos Wound Measurements Length: (cm) 4 Width: (cm) 2.5 Depth: (cm) 0.1 Area: (cm) 7.854 Volume: (cm) 0.785 Wound Description Classification:  Unclassifiable Wound Margin: Flat and Intact Exudate Amount: Small Exudate Type: Sanguinous Exudate Color: red Wound Bed Granulation Amount: None Present (0%) Necrotic Amount: Large (67-100%) Necrotic Quality: Eschar After Cleansing: No rino No Exposed Structure sed: No Subcutaneous Tissue) Exposed: No sed: No sed: No ed: No d: No % Reduction in Area: -42.9% % Reduction in Volume: -42.7% Epithelialization: Small (1-33%) Tunneling: No Undermining: No Foul Odor Slough/Fib Fascia Expo Fat Layer ( Tendon Expo Muscle Expo Joint Expos Bone Expose Electronic Signature(s) Signed: 01/29/2019 4:12:00 PM By: Mikeal Hawthorne EMT/HBOT Signed: 01/29/2019 5:01:35 PM By: Levan Hurst RN, BSN Previous Signature: 01/25/2019 3:10:57 PM Version By: Levan Hurst RN, BSN Entered By: Mikeal Hawthorne on 01/29/2019 09:40:43 -------------------------------------------------------------------------------- Vitals Details Patient Name: Date of Service: [name] 01/24/2019 3:30 PM Medical Record [id number] Patient Account Number: [id number] Date of Birth/Sex: Treating RN: [birth year] (73 y.o. Nancy Fetter Primary Care Shontae Rosiles: BRAKE, ANDREW Other Clinician: Referring Saidah Kempton: Treating Aliegha Paullin/Extender:Madduri, Elissa Hefty, [name] in Treatment: 2 Vital Signs Time Taken: 15:38 Temperature (F): 97.5 Height (in): 62 Pulse (bpm): 69 Respiratory Rate (breaths/min): 16 Blood Pressure (mmHg): 118/48 Capillary Blood Glucose (mg/dl): 119 Reference Range: 80 - 120 mg / dl Electronic Signature(s) Signed: 01/25/2019 3:10:57 PM By: Levan Hurst RN, BSN Entered By: Levan Hurst on 01/24/2019 15:40:41

## 2019-01-29 ENCOUNTER — Other Ambulatory Visit: Payer: Self-pay

## 2019-01-29 ENCOUNTER — Encounter (HOSPITAL_BASED_OUTPATIENT_CLINIC_OR_DEPARTMENT_OTHER): Payer: Medicare Other | Admitting: Internal Medicine

## 2019-01-29 DIAGNOSIS — F17210 Nicotine dependence, cigarettes, uncomplicated: Secondary | ICD-10-CM | POA: Diagnosis not present

## 2019-01-29 DIAGNOSIS — F419 Anxiety disorder, unspecified: Secondary | ICD-10-CM | POA: Diagnosis not present

## 2019-01-29 DIAGNOSIS — L97219 Non-pressure chronic ulcer of right calf with unspecified severity: Secondary | ICD-10-CM | POA: Diagnosis not present

## 2019-01-29 DIAGNOSIS — Z89429 Acquired absence of other toe(s), unspecified side: Secondary | ICD-10-CM | POA: Insufficient documentation

## 2019-01-29 DIAGNOSIS — J449 Chronic obstructive pulmonary disease, unspecified: Secondary | ICD-10-CM | POA: Diagnosis not present

## 2019-01-29 DIAGNOSIS — M1990 Unspecified osteoarthritis, unspecified site: Secondary | ICD-10-CM | POA: Insufficient documentation

## 2019-01-29 DIAGNOSIS — Z9981 Dependence on supplemental oxygen: Secondary | ICD-10-CM | POA: Insufficient documentation

## 2019-01-29 DIAGNOSIS — E1140 Type 2 diabetes mellitus with diabetic neuropathy, unspecified: Secondary | ICD-10-CM | POA: Diagnosis not present

## 2019-01-29 DIAGNOSIS — E11622 Type 2 diabetes mellitus with other skin ulcer: Secondary | ICD-10-CM | POA: Insufficient documentation

## 2019-01-29 DIAGNOSIS — E785 Hyperlipidemia, unspecified: Secondary | ICD-10-CM | POA: Insufficient documentation

## 2019-01-29 DIAGNOSIS — I10 Essential (primary) hypertension: Secondary | ICD-10-CM | POA: Insufficient documentation

## 2019-01-29 DIAGNOSIS — E11621 Type 2 diabetes mellitus with foot ulcer: Secondary | ICD-10-CM | POA: Diagnosis present

## 2019-01-29 DIAGNOSIS — M810 Age-related osteoporosis without current pathological fracture: Secondary | ICD-10-CM | POA: Diagnosis not present

## 2019-01-29 DIAGNOSIS — F209 Schizophrenia, unspecified: Secondary | ICD-10-CM | POA: Diagnosis not present

## 2019-01-29 NOTE — Progress Notes (Signed)
BAYLE, SCHWAMBERGER ([id number]) Visit Report for 01/29/2019 SuperBill Details Patient Name: Date of Service: [name], [name] 01/29/2019 Medical Record [id number] Patient Account Number: [id number] Date of Birth/Sex: Treating RN: 1945/08/01 (74 y.o. [name] Primary Care Provider: Jillyn Duran Other Clinician: Referring Provider: Treating Provider/Extender:Sharman Garrott, [name], [name] in Treatment: 3 Diagnosis Coding ICD-10 Codes Code Description [phone] Non-pressure chronic ulcer of right calf with other specified severity S80.11XD Contusion of right lower leg, subsequent encounter Facility Procedures CPT4 Code Description Modifier Quantity [id number] (Facility Use Only) [phone] - APPLY MULTLAY COMPRS LWR RT LEG 1 Electronic Signature(s) Signed: 01/29/2019 4:44:52 PM By: Carlene Coria RN Signed: 01/29/2019 4:56:38 PM By: Linton Ham MD Entered By: Carlene Coria on 01/29/2019 15:27:43

## 2019-01-29 NOTE — Progress Notes (Signed)
Sally, Duran ([id number]) Visit Report for 01/29/2019 Arrival Information Details Patient Name: Date of Service: [name], [name] 01/29/2019 2:45 PM Medical Record [id number] Patient Account Number: [id number] Date of Birth/Sex: Treating RN: 1945/02/03 (74 y.o. [name] Primary Care Tajuana Kniskern: BRAKE, ANDREW Other Clinician: Referring Nature Kueker: Treating Francile Woolford/Extender:Robson, [name], [name] in Treatment: 3 Visit Information History Since Last Visit All ordered tests and consults were completed: No Patient Arrived: Wheel Chair Added or deleted any medications: No Arrival Time: 14:56 Any new allergies or adverse reactions: No Accompanied By: son Had a fall or experienced change in No activities of daily living that may affect Transfer Assistance: None risk of falls: Patient Identification Verified: Yes Signs or symptoms of abuse/neglect since last No Secondary Verification Process Yes visito Completed: Hospitalized since last visit: No Patient Requires Transmission-Based No Implantable device outside of the clinic excluding No Precautions: cellular tissue based products placed in the center Patient Has Alerts: No since last visit: Has Dressing in Place as Prescribed: Yes Has Compression in Place as Prescribed: Yes Pain Present Now: No Electronic Signature(s) Signed: 01/29/2019 4:44:52 PM By: Carlene Coria RN Entered By: Carlene Coria on 01/29/2019 15:25:05 -------------------------------------------------------------------------------- Compression Therapy Details Patient Name: Date of Service: [name] 01/29/2019 2:45 PM Medical Record [id number] Patient Account Number: [id number] Date of Birth/Sex: Treating RN: 01/05/1946 (74 y.o. [name] Primary Care Nixie Laube: Jillyn Ledger Other Clinician: Referring Syncere Kaminski: Treating Eulonda Andalon/Extender:Robson, [name], [name] in Treatment: 3 Compression Therapy Performed  for Wound Wound #1 Right,Lateral Lower Leg Assessment: Performed By: Clinician Carlene Coria, RN Compression Type: Three Layer Electronic Signature(s) Signed: 01/29/2019 4:44:52 PM By: Carlene Coria RN Entered By: Carlene Coria on 01/29/2019 15:26:31 -------------------------------------------------------------------------------- Encounter Discharge Information Details Patient Name: Date of Service: [name] 01/29/2019 2:45 PM Medical Record [id number] Patient Account Number: [id number] Date of Birth/Sex: Treating RN: [birth year] (74 y.o. [name] Primary Care Alayziah Tangeman: Jillyn Ledger Other Clinician: Referring Keimya Briddell: Treating Daijha Leggio/Extender:Robson, [name], [name] in Treatment: 3 Encounter Discharge Information Items Discharge Condition: Stable Ambulatory Status: Wheelchair Discharge Destination: Home Transportation: Private Auto Accompanied By: son Schedule Follow-up Appointment: Yes Clinical Summary of Care: Patient Declined Electronic Signature(s) Signed: 01/29/2019 4:44:52 PM By: Carlene Coria RN Entered By: Carlene Coria on 01/29/2019 15:27:33 -------------------------------------------------------------------------------- Patient/Caregiver Education Details Patient Name: Date of Service: [name] 1/4/2021andnbsp2:45 PM Medical Record [id number] Patient Account Number: [id number] Date of Birth/Gender: Treating RN: 04-24-1945 (73 y.o. [name] Primary Care Physician: Jillyn Ledger Other Clinician: Referring Physician: Treating Physician/Extender:Robson, [name], [name] in Treatment: 3 Education Assessment Education Provided To: Patient Education Topics Provided Wound/Skin Impairment: Methods: Explain/Verbal Responses: State content correctly Electronic Signature(s) Signed: 01/29/2019 4:44:52 PM By: Carlene Coria RN Entered By: Carlene Coria on 01/29/2019  15:27:16 -------------------------------------------------------------------------------- Wound Assessment Details Patient Name: Date of Service: Sally, Duran 01/29/2019 2:45 PM Medical Record [id number] Patient Account Number: [id number] Date of Birth/Sex: Treating RN: 09/25/1945 (74 y.o. [name] Primary Care Kion Huntsberry: BRAKE, ANDREW Other Clinician: Referring Clarie Camey: Treating Joseangel Nettleton/Extender:Robson, [name], [name] in Treatment: 3 Wound Status Wound Number: 1 Primary Venous Leg Ulcer Etiology: Wound Location: Right Lower Leg - Lateral Wound Open Wounding Event: Trauma Status: Date Acquired: 12/21/2018 Comorbid Glaucoma, Chronic Obstructive Pulmonary Weeks Of Treatment: 3 History: Disease (COPD), Hypertension, Type II Clustered Wound: No Diabetes, Osteoarthritis, Osteomyelitis, Neuropathy, Confinement Anxiety Wound Measurements Length: (cm) 4 % Reduct Width: (cm) 2.5 % Reduct Depth: (cm) 0.1 Epitheli Area: (cm) 7.854 Tunneli Volume: (cm) 0.785 Undermi  Wound Description Classification: Full Thickness Without Exposed Support Foul Od Structures Slough/ Wound Flat and Intact Margin: Exudate Medium Amount: Exudate Serosanguineous Type: Exudate red, brown Color: Wound Bed Granulation Amount: Small (1-33%) Necrotic Amount: Large (67-100%) Fascia E Necrotic Quality: Eschar Fat Laye Tendon E Muscle E Joint Expos Bone Expose or After Cleansing: No Fibrino Yes Exposed Structure xposed: No r (Subcutaneous Tissue) Exposed: Yes xposed: No xposed: No ed: No d: No ion in Area: -42.9% ion in Volume: -42.7% alization: Small (1-33%) ng: No ning: No Treatment Notes Wound #1 (Right, Lateral Lower Leg) 1. Cleanse With Wound Cleanser Soap and water 3. Primary Dressing Applied Calcium Alginate Ag 4. Secondary Dressing Dry Gauze 6. Support Layer Applied 3 layer compression wrap Notes netting. Electronic  Signature(s) Signed: 01/29/2019 4:44:52 PM By: Carlene Coria RN Entered By: Carlene Coria on 01/29/2019 15:26:12 -------------------------------------------------------------------------------- Vitals Details Patient Name: Date of Service: [name] 01/29/2019 2:45 PM Medical Record [id number] Patient Account Number: [id number] Date of Birth/Sex: Treating RN: 1945/08/10 (74 y.o. [name] Primary Care Nickolas Chalfin: BRAKE, ANDREW Other Clinician: Referring Ciaira Natividad: Treating Anaiya Wisinski/Extender:Robson, [name], [name] in Treatment: 3 Vital Signs Time Taken: 15:25 Temperature (F): 97.6 Height (in): 62 Pulse (bpm): 64 Respiratory Rate (breaths/min): 18 Blood Pressure (mmHg): 150/57 Reference Range: 80 - 120 mg / dl Electronic Signature(s) Signed: 01/29/2019 4:44:52 PM By: Carlene Coria RN Entered By: Carlene Coria on 01/29/2019 15:25:24

## 2019-01-31 ENCOUNTER — Encounter (HOSPITAL_BASED_OUTPATIENT_CLINIC_OR_DEPARTMENT_OTHER): Payer: Medicare Other | Admitting: Physician Assistant

## 2019-02-07 ENCOUNTER — Encounter (HOSPITAL_BASED_OUTPATIENT_CLINIC_OR_DEPARTMENT_OTHER): Payer: Medicare Other | Admitting: Physician Assistant

## 2019-02-07 ENCOUNTER — Other Ambulatory Visit: Payer: Self-pay

## 2019-02-07 DIAGNOSIS — E11622 Type 2 diabetes mellitus with other skin ulcer: Secondary | ICD-10-CM | POA: Diagnosis not present

## 2019-02-07 DIAGNOSIS — I872 Venous insufficiency (chronic) (peripheral): Secondary | ICD-10-CM | POA: Diagnosis not present

## 2019-02-07 DIAGNOSIS — L97219 Non-pressure chronic ulcer of right calf with unspecified severity: Secondary | ICD-10-CM | POA: Diagnosis not present

## 2019-02-07 DIAGNOSIS — L97212 Non-pressure chronic ulcer of right calf with fat layer exposed: Secondary | ICD-10-CM | POA: Diagnosis not present

## 2019-02-07 DIAGNOSIS — E1140 Type 2 diabetes mellitus with diabetic neuropathy, unspecified: Secondary | ICD-10-CM | POA: Diagnosis not present

## 2019-02-07 DIAGNOSIS — M810 Age-related osteoporosis without current pathological fracture: Secondary | ICD-10-CM | POA: Diagnosis not present

## 2019-02-07 DIAGNOSIS — F419 Anxiety disorder, unspecified: Secondary | ICD-10-CM | POA: Diagnosis not present

## 2019-02-07 DIAGNOSIS — M79661 Pain in right lower leg: Secondary | ICD-10-CM | POA: Diagnosis not present

## 2019-02-07 DIAGNOSIS — E785 Hyperlipidemia, unspecified: Secondary | ICD-10-CM | POA: Diagnosis not present

## 2019-02-07 LAB — GLUCOSE, CAPILLARY: Glucose-Capillary: 142 mg/dL — ABNORMAL HIGH (ref 70–99)

## 2019-02-07 NOTE — Progress Notes (Addendum)
Duran, Sally ([id number]) Visit Report for 02/07/2019 Chief Complaint Document Details Patient Name: Date of Service: Duran, Sally 02/07/2019 3:00 PM Medical Record [id number] Patient Account Number: [id number] Date of Birth/Sex: Treating RN: [birth year] (74 y.o. F) Primary Care Provider: BRAKE, ANDREW Other Clinician: Referring Provider: Treating Provider/Extender:Stone III, Lyndee Hensen, [name] in Treatment: 4 Information Obtained from: Patient Chief Complaint 01/05/2019; patient arrives in clinic for review of contusion on the right lateral calf area Electronic Signature(s) Signed: 02/07/2019 3:26:03 PM By: Worthy Keeler PA-C Entered By: Worthy Keeler on 02/07/2019 15:26:02 -------------------------------------------------------------------------------- Debridement Details Patient Name: Date of Service: [name]. 02/07/2019 3:00 PM Medical Record [id number] Patient Account Number: [id number] Date of Birth/Sex: Treating RN: 02-Apr-1945 (74 y.o. Elam Dutch Primary Care Provider: BRAKE, Mitzi Hansen Other Clinician: Referring Provider: Treating Provider/Extender:Stone III, Lyndee Hensen, [name] in Treatment: 4 Debridement Performed for Wound #1 Right,Lateral Lower Leg Assessment: Performed By: Physician Worthy Keeler, PA Debridement Type: Debridement Severity of Tissue Pre Fat layer exposed Debridement: Level of Consciousness (Pre- Awake and Alert procedure): Pre-procedure Verification/Time Out Taken: Yes - 17:05 Start Time: 17:06 Pain Control: Lidocaine 4% Topical Solution Total Area Debrided (L x W): 3 (cm) x 2.4 (cm) = 7.2 (cm) Tissue and other material Viable, Non-Viable, Slough, Subcutaneous, Slough debrided: Level: Skin/Subcutaneous Tissue Debridement Description: Excisional Instrument: Curette Bleeding: Minimum Hemostasis Achieved: Pressure Procedural Pain: 6 Post Procedural Pain: 3 Response to Treatment: Procedure  was tolerated well Level of Consciousness Awake and Alert (Post-procedure): Post Debridement Measurements of Total Wound Length: (cm) 3.7 Width: (cm) 2.4 Depth: (cm) 1.2 Volume: (cm) 8.369 Character of Wound/Ulcer Post Requires Further Debridement Debridement: Severity of Tissue Post Debridement: Fat layer exposed Post Procedure Diagnosis Same as Pre-procedure Electronic Signature(s) Signed: 02/07/2019 5:55:19 PM By: Worthy Keeler PA-C Signed: 02/07/2019 6:34:17 PM By: Baruch Gouty RN, BSN Entered By: Baruch Gouty on 02/07/2019 17:08:25 -------------------------------------------------------------------------------- HPI Details Patient Name: Date of Service: [name] 02/07/2019 3:00 PM Medical Record [id number] Patient Account Number: [id number] Date of Birth/Sex: Treating RN: Nov 09, 1945 (74 y.o. F) Primary Care Provider: BRAKE, ANDREW Other Clinician: Referring Provider: Treating Provider/Extender:Stone III, Lyndee Hensen, [name] in Treatment: 4 History of Present Illness HPI Description: ADMISSION 01/05/2019 This is a 74 year old woman with advanced COPD on chronic O2. She managed to strike her lower leg while getting out of a car on Thanksgiving day. She had a wound. The vent they tried to look after themselves at home eventually saw her primary doctor a week later. She was given Keflex and Neosporin. An x-ray was apparently done but I do not have these results. She was seen in the ER last night and given doxycycline this appointment was arranged. The patient has a 3.5 x 2 surface area of black eschar and wound. Swelling inferiorly suggestive of a hematoma. I see no current evidence of infection Past medical history type 2 diabetes recent hemoglobin A1c of 7.6 in April. Chronic COPD the on oxygen. 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 ABI in this clinic was 1.3 on the right 12/18; the necrotic surface of this hematoma area remained intact which is somewhat surprising. The swelling around the area has gotten better. She is still having some discomfort managed with as needed tramadol 12/30-Patient returns with dense looking eschar on top of the hematoma on the right lateral leg calf area. Patient is on O2 around-the-clock 02/07/2019 upon evaluation today  patient presents for reevaluation in the clinic concerning issues with her right lower extremity ulcer around the calf. She had a hematoma here that was debrided away. She does have a tunnel at 2:00 and overall this seems to be doing okay though there is some sign of infection at this point there is some erythema and warmth to touch around the wound that has me somewhat concerned. Fortunately there is no evidence of active infection systemically which is good news. The patient states in general that she is having some pain although this is not terrible she is okay with me attempting sharp debridement to clear away as much of the necrotic tissue as I can. Electronic Signature(s) Signed: 02/07/2019 5:12:42 PM By: Worthy Keeler PA-C Entered By: Worthy Keeler on 02/07/2019 17:12:42 -------------------------------------------------------------------------------- Physical Exam Details Patient Name: Date of Service: [name], [name] 02/07/2019 3:00 PM Medical Record [id number] Patient Account Number: [id number] Date of Birth/Sex: Treating RN: 24-Jan-1946 (74 y.o. F) Primary Care Provider: BRAKE, ANDREW Other Clinician: Referring Provider: Treating Provider/Extender:Stone III, Caven Perine BRAKE, [name] in Treatment: 4 Constitutional Well-nourished and well-hydrated in no acute distress. Respiratory normal breathing without difficulty. Psychiatric this patient is able to make decisions and demonstrates good insight into disease process. Alert  and Oriented x 3. pleasant and cooperative. Notes Patient's wound bed did require sharp debridement today to clear away necrotic tissue from the surface of the wound which she tolerated quite well. I did not get everything cleared away but it was significantly improved compared to prior evaluation. All visit is good to take some time and serial debridements to get this doing exactly how we wanted to. Fortunately however I do feel like she will be able to tolerate this little at a time. We will get a likely continue to pack this wound with a silver alginate dressing. Electronic Signature(s) Signed: 02/07/2019 5:13:10 PM By: Worthy Keeler PA-C Entered By: Worthy Keeler on 02/07/2019 17:13:10 -------------------------------------------------------------------------------- Physician Orders Details Patient Name: Date of Service: [name] 02/07/2019 3:00 PM Medical Record [id number] Patient Account Number: [id number] Date of Birth/Sex: Treating RN: 03/21/1945 (74 y.o. Elam Dutch Primary Care Provider: Jillyn Ledger Other Clinician: Referring Provider: Treating Provider/Extender:Stone III, Lyndee Hensen, [name] in Treatment: 4 Verbal / Phone Orders: No Diagnosis Coding ICD-10 Coding Code Description [id number] Non-pressure chronic ulcer of right calf with other specified severity S80.11XD Contusion of right lower leg, subsequent encounter Follow-up Appointments Return Appointment in 1 week. Dressing Change Frequency Do not change entire dressing for one week. Skin Barriers/Peri-Wound Care Moisturizing lotion Wound Cleansing May shower with protection. - can use cast protector Primary Wound Dressing Wound #1 Right,Lateral Lower Leg Calcium Alginate with Silver - pack lightly into undermining at 3 o'clock Secondary Dressing Wound #1 Right,Lateral Lower Leg Dry Gauze ABD pad Edema Control 3 Layer Compression System - Right Lower Extremity Avoid standing  for long periods of time Elevate legs to the level of the heart or above for 30 minutes daily and/or when sitting, a frequency of: Exercise regularly Laboratory Bacteria identified in Unspecified specimen by Anaerobe culture (MICRO) - right lower leg LOINC Code: [id number] Convenience Name: Anerobic culture Patient Medications Allergies: Januvia Notifications Medication Indication Start End doxycycline hyclate 02/07/2019 DOSE 1 - oral 100 mg capsule - 1 capsule oral taken 2 times a day for 14 days Electronic Signature(s) Signed: 02/07/2019 5:14:55 PM By: Worthy Keeler PA-C Entered By: Worthy Keeler on 02/07/2019 17:14:55 -------------------------------------------------------------------------------- Problem List Details Patient Name: Date  of Service: ADDALYN, GLADMAN 02/07/2019 3:00 PM Medical Record [id number] Patient Account Number: [id number] Date of Birth/Sex: Treating RN: Sep 22, 1945 (74 y.o. F) Primary Care Provider: BRAKE, ANDREW Other Clinician: Referring Provider: Treating Provider/Extender:Stone III, Lyndee Hensen, [name] in Treatment: 4 Active Problems ICD-10 Evaluated Encounter Code Description Active Date Today Diagnosis L97.218 Non-pressure chronic ulcer of right calf with other 01/05/2019 No Yes specified severity S80.11XD Contusion of right lower leg, subsequent encounter 01/05/2019 No Yes Inactive Problems Resolved Problems Electronic Signature(s) Signed: 02/07/2019 3:25:57 PM By: Worthy Keeler PA-C Entered By: Worthy Keeler on 02/07/2019 15:25:57 -------------------------------------------------------------------------------- Progress Note Details Patient Name: Date of Service: [name] 02/07/2019 3:00 PM Medical Record [id number] Patient Account Number: [id number] Date of Birth/Sex: Treating RN: 1945-04-05 (74 y.o. F) Primary Care Provider: Jillyn Ledger Other Clinician: Referring Provider: Treating Provider/Extender:Stone  III, Lyndee Hensen, [name] in Treatment: 4 Subjective Chief Complaint Information obtained from Patient 01/05/2019; patient arrives in clinic for review of contusion on the right lateral calf area History of Present Illness (HPI) ADMISSION 01/05/2019 This is a 74 year old woman with advanced COPD on chronic O2. She managed to strike her lower leg while getting out of a car on Thanksgiving day. She had a wound. The vent they tried to look after themselves at home eventually saw her primary doctor a week later. She was given Keflex and Neosporin. An x-ray was apparently done but I do not have these results. She was seen in the ER last night and given doxycycline this appointment was arranged. The patient has a 3.5 x 2 surface area of black eschar and wound. Swelling inferiorly suggestive of a hematoma. I see no current evidence of infection Past medical history type 2 diabetes recent hemoglobin A1c of 7.6 in April. Chronic COPD the on oxygen. 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 ABI in this clinic was 1.3 on the right 12/18; the necrotic surface of this hematoma area remained intact which is somewhat surprising. The swelling around the area has gotten better. She is still having some discomfort managed with as needed tramadol 12/30-Patient returns with dense looking eschar on top of the hematoma on the right lateral leg calf area. Patient is on O2 around-the-clock 02/07/2019 upon evaluation today patient presents for reevaluation in the clinic concerning issues with her right lower extremity ulcer around the calf. She had a hematoma here that was debrided away. She does have a tunnel at 2:00 and overall this seems to be doing okay though there is some sign of infection at this point there is some erythema and warmth to touch around the wound that has me  somewhat concerned. Fortunately there is no evidence of active infection systemically which is good news. The patient states in general that she is having some pain although this is not terrible she is okay with me attempting sharp debridement to clear away as much of the necrotic tissue as I can. Objective Constitutional Well-nourished and well-hydrated in no acute distress. Vitals Time Taken: 3:59 PM, Height: 62 in, Source: Stated, Temperature: 98.5 F, Pulse: 97 bpm, Respiratory Rate: 22 breaths/min, Blood Pressure: 107/36 mmHg, Capillary Blood Glucose: 142 mg/dl, Pulse Oximetry: 97 %. General Notes: son asked for BS to be checked secondary to lethargy and fatigue Respiratory normal breathing without difficulty. Psychiatric this patient is able to make decisions and demonstrates good insight into disease process. Alert and Oriented x 3. pleasant and cooperative.  General Notes: Patient's wound bed did require sharp debridement today to clear away necrotic tissue from the surface of the wound which she tolerated quite well. I did not get everything cleared away but it was significantly improved compared to prior evaluation. All visit is good to take some time and serial debridements to get this doing exactly how we wanted to. Fortunately however I do feel like she will be able to tolerate this little at a time. We will get a likely continue to pack this wound with a silver alginate dressing. Integumentary (Hair, Skin) Wound #1 status is Open. Original cause of wound was Trauma. The wound is located on the Right,Lateral Lower Leg. The wound measures 3.7cm length x 2.4cm width x 1.2cm depth; 6.974cm^2 area and 8.369cm^3 volume. There is Fat Layer (Subcutaneous Tissue) Exposed exposed. There is no undermining noted, however, there is tunneling at 3:00 with a maximum distance of 2.5cm. There is a medium amount of serosanguineous drainage noted. The wound margin is flat and intact. There is no  granulation within the wound bed. There is a large (67-100%) amount of necrotic tissue within the wound bed including Adherent Slough. 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 There was a Excisional Skin/Subcutaneous Tissue Debridement with a total area of 7.2 sq cm performed by Worthy Keeler, PA. With the following instrument(s): Curette to remove Viable and Non-Viable tissue/material. Material removed includes Subcutaneous Tissue and Slough and after achieving pain control using Lidocaine 4% Topical Solution. No specimens were taken. A time out was conducted at 17:05, prior to the start of the procedure. A Minimum amount of bleeding was controlled with Pressure. The procedure was tolerated well with a pain level of 6 throughout and a pain level of 3 following the procedure. Post Debridement Measurements: 3.7cm length x 2.4cm width x 1.2cm depth; 8.369cm^3 volume. Character of Wound/Ulcer Post Debridement requires further debridement. Severity of Tissue Post Debridement is: Fat layer exposed. 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 There was a Three Layer Compression Therapy Procedure by Carlene Coria, RN. Post procedure Diagnosis Wound #1: Same as Pre-Procedure Plan Follow-up Appointments: Return Appointment in 1 week. Dressing Change Frequency: Do not change entire dressing for one week. Skin Barriers/Peri-Wound Care: Moisturizing lotion Wound Cleansing: May shower with protection. - can use cast protector Primary Wound Dressing: Wound #1 Right,Lateral Lower Leg: Calcium Alginate  with Silver - pack lightly into undermining at 3 o'clock Secondary Dressing: Wound #1 Right,Lateral Lower Leg: Dry Gauze ABD pad Edema Control: 3 Layer Compression System - Right Lower Extremity Avoid standing for long periods of time Elevate legs to the level of the heart or above for 30 minutes daily and/or when sitting, a frequency of: Exercise regularly Laboratory ordered were: Anerobic culture - right lower leg The following medication(s) was prescribed: doxycycline hyclate oral 100 mg capsule 1 1 capsule oral taken 2 times a day for 14 days starting 02/07/2019 1. At this time I do think the patient is going require an antibiotic I am get a go ahead and send in a prescription for doxycycline for her to the pharmacy. She is taken this before without complication. 2.  I am also going to go ahead and obtain a wound culture which I did send to the lab today. We will see what that shows and then make any adjustments as needed in her treatment regimen once that returns. 3. I am get a recommend that we continue with a 3 layer compression wrap as well I think that is beneficial and again were packing the wound with a silver alginate dressing. 4. I do recommend the patient is much as possible attempt elevate her legs to help with edema control as well. We will see patient back for reevaluation in 1 week here in the clinic. If anything worsens or changes patient will contact our office for additional recommendations. Electronic Signature(s) Signed: 02/07/2019 5:15:04 PM By: Worthy Keeler PA-C Entered By: Worthy Keeler on 02/07/2019 17:15:04 -------------------------------------------------------------------------------- SuperBill Details Patient Name: Date of Service: [name] 02/07/2019 Medical Record [id number] Patient Account Number: [id number] Date of Birth/Sex: Treating RN: 02-07-1945 (74 y.o. Elam Dutch Primary Care Provider: Jillyn Ledger Other  Clinician: Referring Provider: Treating Provider/Extender:Stone III, Lyndee Hensen, [name] in Treatment: 4 Diagnosis Coding ICD-10 Codes Code Description [id number] Non-pressure chronic ulcer of right calf with other specified severity S80.11XD Contusion of right lower leg, subsequent encounter Facility Procedures CPT4 Code Description: [id number] - DEB SUBQ TISSUE 20 SQ CM/< ICD-10 Diagnosis Description L97.218 Non-pressure chronic ulcer of right calf with other spec Modifier: ified severity Quantity: 1 Physician Procedures CPT4 Code Description: [id number] - WC PHYS LEVEL 4 - EST PT ICD-10 Diagnosis Description L97.218 Non-pressure chronic ulcer of right calf with other spec S80.11XD Contusion of right lower leg, subsequent encounter Modifier: 25 ified severity Quantity: 1 CPT4 Code Description: [id number] - WC PHYS SUBQ TISS 20 SQ CM ICD-10 Diagnosis Description [id number] Non-pressure chronic ulcer of right calf with other spec Modifier: ified severity Quantity: 1 Electronic Signature(s) Signed: 02/07/2019 5:15:52 PM By: Worthy Keeler PA-C Entered By: Worthy Keeler on 02/07/2019 17:15:52

## 2019-02-07 NOTE — Progress Notes (Signed)
[name], Duran ([id number]) Visit Report for 02/07/2019 Arrival Information Details Patient Name: Date of Service: [name], [name] 02/07/2019 3:00 PM Medical Record [id number] Patient Account Number: [id number] Date of Birth/Sex: Treating RN: Aug 14, 1945 (74 y.o. Elam Dutch Primary Care Stevie Ertle: BRAKE, Mitzi Hansen Other Clinician: Referring Libero Puthoff: Treating Neda Willenbring/Extender:Stone III, Lyndee Hensen, [name] in Treatment: 4 Visit Information History Since Last Visit Added or deleted any medications: No Patient Arrived: Ambulatory Any new allergies or adverse reactions: No Arrival Time: 15:56 Had a fall or experienced change in No Accompanied By: son activities of daily living that may affect Transfer Assistance: None risk of falls: Patient Identification Verified: Yes Signs or symptoms of abuse/neglect since last No Secondary Verification Process Yes visito Completed: Hospitalized since last visit: No Patient Requires Transmission-Based No Implantable device outside of the clinic excluding No Precautions: cellular tissue based products placed in the center Patient Has Alerts: No since last visit: Has Dressing in Place as Prescribed: Yes Has Compression in Place as Prescribed: Yes Pain Present Now: No Electronic Signature(s) Signed: 02/07/2019 6:34:17 PM By: Baruch Gouty RN, BSN Entered By: Baruch Gouty on 02/07/2019 15:57:25 -------------------------------------------------------------------------------- Compression Therapy Details Patient Name: Date of Service: [name] 02/07/2019 3:00 PM Medical Record [id number] Patient Account Number: [id number] Date of Birth/Sex: Treating RN: 1945-12-30 (74 y.o. Elam Dutch Primary Care Kaylum Shrum: Jillyn Ledger Other Clinician: Referring Bernadette Armijo: Treating Helina Hullum/Extender:Stone III, Lyndee Hensen, [name] in Treatment: 4 Compression Therapy Performed for Wound Wound #1  Right,Lateral Lower Leg Assessment: Performed By: Clinician Carlene Coria, RN Compression Type: Three Layer Post Procedure Diagnosis Same as Pre-procedure Electronic Signature(s) Signed: 02/07/2019 6:34:17 PM By: Baruch Gouty RN, BSN Entered By: Baruch Gouty on 02/07/2019 17:08:43 -------------------------------------------------------------------------------- Encounter Discharge Information Details Patient Name: Date of Service: [name] 02/07/2019 3:00 PM Medical Record [id number] Patient Account Number: [id number] Date of Birth/Sex: Treating RN: 1945-12-13 (74 y.o. [name] Primary Care Anny Sayler: Jillyn Ledger Other Clinician: Referring Oseas Detty: Treating Alexzandra Bilton/Extender:Stone III, Lyndee Hensen, [name] in Treatment: 4 Encounter Discharge Information Items Post Procedure Vitals Discharge Condition: Stable Temperature (F): 98.5 Ambulatory Status: Ambulatory Pulse (bpm): 97 Discharge Destination: Home Respiratory Rate (breaths/min): 22 Transportation: Private Auto Blood Pressure (mmHg): 107/36 Accompanied By: self Schedule Follow-up Appointment: Yes Clinical Summary of Care: Patient Declined Electronic Signature(s) Signed: 02/07/2019 6:22:51 PM By: Carlene Coria RN Entered By: Carlene Coria on 02/07/2019 17:37:29 -------------------------------------------------------------------------------- Lower Extremity Assessment Details Patient Name: Date of Service: [name] 02/07/2019 3:00 PM Medical Record [id number] Patient Account Number: [id number] Date of Birth/Sex: Treating RN: 1945/12/08 (74 y.o. Elam Dutch Primary Care Makynleigh Breslin: BRAKE, Mitzi Hansen Other Clinician: Referring Zoua Caporaso: Treating Zion Ta/Extender:Stone III, Lyndee Hensen, [name] in Treatment: 4 Edema Assessment Assessed: [Left: Yes] [Right: No] Edema: [Left: Ye] [Right: s] Calf Left: Right: Point of Measurement: 28 cm From Medial Instep cm 38.5  cm Ankle Left: Right: Point of Measurement: 10 cm From Medial Instep cm 21.8 cm Vascular Assessment Pulses: Dorsalis Pedis Palpable: [Right:Yes] Electronic Signature(s) Signed: 02/07/2019 6:34:17 PM By: Baruch Gouty RN, BSN Entered By: Baruch Gouty on 02/07/2019 16:13:26 -------------------------------------------------------------------------------- Multi-Disciplinary Care Plan Details Patient Name: Date of Service: [name]. 02/07/2019 3:00 PM Medical Record [id number] Patient Account Number: [id number] Date of Birth/Sex: Treating RN: 10-03-1945 (74 y.o. Elam Dutch Primary Care Kaceton Vieau: Jillyn Ledger Other Clinician: Referring Vonnetta Akey: Treating Duncan Alejandro/Extender:Stone III, Lyndee Hensen, [name] in Treatment: 4 Active Inactive Venous Leg Ulcer Nursing Diagnoses: Potential for venous Insuffiency (use before diagnosis confirmed) Goals: Patient will maintain  optimal edema control Date Initiated: 02/07/2019 Target Resolution Date: 03/07/2019 Goal Status: Active Interventions: Assess peripheral edema status every visit. Compression as ordered Provide education on venous insufficiency Treatment Activities: Therapeutic compression applied : 02/07/2019 Notes: Wound/Skin Impairment Nursing Diagnoses: Impaired tissue integrity Goals: Patient/caregiver will verbalize understanding of skin care regimen Date Initiated: 02/07/2019 Target Resolution Date: 03/07/2019 Goal Status: Active Ulcer/skin breakdown will have a volume reduction of 30% by week 4 Date Initiated: 01/05/2019 Date Inactivated: 02/07/2019 Target Resolution Date: 02/02/2019 Unmet Reason: hematoma Goal Status: Unmet unroofed Ulcer/skin breakdown will have a volume reduction of 50% by week 8 Date Initiated: 02/07/2019 Target Resolution Date: 03/07/2019 Goal Status: Active Interventions: Provide education on smoking Provide education on ulcer and skin care Notes: Electronic  Signature(s) Signed: 02/07/2019 6:34:17 PM By: Baruch Gouty RN, BSN Entered By: Baruch Gouty on 02/07/2019 [id number] -------------------------------------------------------------------------------- Pain Assessment Details Patient Name: Date of Service: [name] 02/07/2019 3:00 PM Medical Record [id number] Patient Account Number: [id number] Date of Birth/Sex: Treating RN: 21-Sep-1945 (74 y.o. Elam Dutch Primary Care Nasir Bright: Jillyn Ledger Other Clinician: Referring Jilliam Bellmore: Treating Everado Pillsbury/Extender:Stone III, Lyndee Hensen, [name] in Treatment: 4 Active Problems Location of Pain Severity and Description of Pain Patient Has Paino No Site Locations Rate the pain. Current Pain Level: 0 Pain Management and Medication Current Pain Management: Electronic Signature(s) Signed: 02/07/2019 6:34:17 PM By: Baruch Gouty RN, BSN Entered By: Baruch Gouty on 02/07/2019 16:02:34 -------------------------------------------------------------------------------- Patient/Caregiver Education Details Patient Name: Date of Service: [name] 1/13/2021andnbsp3:00 PM Medical Record [id number] Patient Account Number: [id number] Date of Birth/Gender: Treating RN: [birth year] (73 y.o. Elam Dutch Primary Care Physician: Jillyn Ledger Other Clinician: Referring Physician: Treating Physician/Extender:Stone III, Lyndee Hensen, [name] in Treatment: 4 Education Assessment Education Provided To: Patient Education Topics Provided Venous: Methods: Explain/Verbal Responses: Reinforcements needed, State content correctly Wound/Skin Impairment: Methods: Explain/Verbal Responses: Reinforcements needed, State content correctly Electronic Signature(s) Signed: 02/07/2019 6:34:17 PM By: Baruch Gouty RN, BSN Entered By: Baruch Gouty on 02/07/2019 17:06:36 -------------------------------------------------------------------------------- Wound  Assessment Details Patient Name: Date of Service: [name] 02/07/2019 3:00 PM Medical Record [id number] Patient Account Number: [id number] Date of Birth/Sex: Treating RN: 1945/08/04 (74 y.o. Elam Dutch Primary Care Corisa Montini: BRAKE, Mitzi Hansen Other Clinician: Referring Vanisha Whiten: Treating Elizaveta Mattice/Extender:Stone III, Lyndee Hensen, [name] in Treatment: 4 Wound Status Wound Number: 1 Primary Venous Leg Ulcer Etiology: Wound Location: Right Lower Leg - Lateral Wound Open Wounding Event: Trauma Status: Date Acquired: 12/21/2018 Comorbid Glaucoma, Chronic Obstructive Pulmonary Weeks Of Treatment: 4 History: Disease (COPD), Hypertension, Type II Clustered Wound: No Diabetes, Osteoarthritis, Osteomyelitis, Neuropathy, Confinement Anxiety Wound Measurements Length: (cm) 3.7 Width: (cm) 2.4 Depth: (cm) 1.2 Area: (cm) 6.974 Volume: (cm) 8.369 % Reduction in Area: -26.8% % Reduction in Volume: -1421.6% Epithelialization: Small (1-33%) Tunneling: Yes Position (o'clock): 3 Maximum Distance: (cm) 2.5 Undermining: No Wound Description Classification: Full Thickness Without Exposed Support Foul Od Structures Slough/ Wound Flat and Intact Margin: Exudate Medium Amount: Exudate Serosanguineous Type: Exudate red, brown Color: Wound Bed Granulation Amount: None Present (0%) Necrotic Amount: Large (67-100%) Fascia E Necrotic Quality: Adherent Slough Fat Laye Tendon E Muscle E Joint Ex Bone Exp or After Cleansing: No Fibrino Yes Exposed Structure xposed: No r (Subcutaneous Tissue) Exposed: Yes xposed: No xposed: No posed: No osed: No Treatment Notes Wound #1 (Right, Lateral Lower Leg) 1. Cleanse With Saline Soap and water 3. Primary Dressing Applied Calcium Alginate Ag 4. Secondary Dressing ABD Pad Dry Gauze 6. Support Layer Applied 3 layer compression wrap  Notes netting. Electronic Signature(s) Signed: 02/07/2019 6:34:17 PM By:  Baruch Gouty RN, BSN Entered By: Baruch Gouty on 02/07/2019 16:18:14 -------------------------------------------------------------------------------- [place] Details Patient Name: Date of Service: [name] 02/07/2019 3:00 PM Medical Record [id number] Patient Account Number: [id number] Date of Birth/Sex: Treating RN: 05-Dec-1945 (74 y.o. Elam Dutch Primary Care Angela Platner: BRAKE, Mitzi Hansen Other Clinician: Referring Keymarion Bearman: Treating Herley Bernardini/Extender:Stone III, Lyndee Hensen, [name] in Treatment: 4 Vital Signs Time Taken: 15:59 Temperature (F): 98.5 Height (in): 62 Pulse (bpm): 97 Source: Stated Respiratory Rate (breaths/min): 22 Blood Pressure (mmHg): 107/36 Capillary Blood Glucose (mg/dl): 142 Reference Range: 80 - 120 mg / dl Airway Pulse Oximetry (%): 97 Inhaled Oxygen Concentration (%): 3 Notes son asked for BS to be checked secondary to lethargy and fatigue Electronic Signature(s) Signed: 02/07/2019 6:34:17 PM By: Baruch Gouty RN, BSN Entered By: Baruch Gouty on 02/07/2019 16:06:45

## 2019-02-08 ENCOUNTER — Other Ambulatory Visit (HOSPITAL_COMMUNITY)
Admission: RE | Admit: 2019-02-08 | Discharge: 2019-02-08 | Disposition: A | Discharge status: Home / Self Care | Payer: Medicare Other | Source: Other Acute Inpatient Hospital | Attending: Physician Assistant | Admitting: Physician Assistant

## 2019-02-08 DIAGNOSIS — L97218 Non-pressure chronic ulcer of right calf with other specified severity: Secondary | ICD-10-CM | POA: Diagnosis not present

## 2019-02-11 LAB — AEROBIC CULTURE W GRAM STAIN (SUPERFICIAL SPECIMEN): Gram Stain: NONE SEEN

## 2019-02-14 ENCOUNTER — Other Ambulatory Visit: Payer: Self-pay

## 2019-02-14 ENCOUNTER — Encounter (HOSPITAL_BASED_OUTPATIENT_CLINIC_OR_DEPARTMENT_OTHER): Payer: Medicare Other | Attending: Physician Assistant | Admitting: Physician Assistant

## 2019-02-14 DIAGNOSIS — F419 Anxiety disorder, unspecified: Secondary | ICD-10-CM | POA: Diagnosis not present

## 2019-02-14 DIAGNOSIS — L97219 Non-pressure chronic ulcer of right calf with unspecified severity: Secondary | ICD-10-CM | POA: Diagnosis not present

## 2019-02-14 DIAGNOSIS — I872 Venous insufficiency (chronic) (peripheral): Secondary | ICD-10-CM | POA: Diagnosis not present

## 2019-02-14 DIAGNOSIS — E11622 Type 2 diabetes mellitus with other skin ulcer: Secondary | ICD-10-CM | POA: Diagnosis not present

## 2019-02-14 DIAGNOSIS — M810 Age-related osteoporosis without current pathological fracture: Secondary | ICD-10-CM | POA: Diagnosis not present

## 2019-02-14 DIAGNOSIS — E1140 Type 2 diabetes mellitus with diabetic neuropathy, unspecified: Secondary | ICD-10-CM | POA: Diagnosis not present

## 2019-02-14 DIAGNOSIS — L97212 Non-pressure chronic ulcer of right calf with fat layer exposed: Secondary | ICD-10-CM | POA: Diagnosis not present

## 2019-02-14 DIAGNOSIS — E785 Hyperlipidemia, unspecified: Secondary | ICD-10-CM | POA: Diagnosis not present

## 2019-02-14 NOTE — Progress Notes (Addendum)
[name], [name] ([id number]) Visit Report for 02/14/2019 Chief Complaint Document Details Patient Name: Date of Service: [name] 02/14/2019 3:00 PM Medical Record [id number] Patient Account Number: [id number] Date of Birth/Sex: Treating RN: [birth year] (74 y.o. F) Primary Care Provider: BRAKE, ANDREW Other Clinician: Referring Provider: Treating Provider/Extender:Stone III, Lyndee Hensen, [name] in Treatment: 5 Information Obtained from: Patient Chief Complaint 01/05/2019; patient arrives in clinic for review of contusion on the right lateral calf area Electronic Signature(s) Signed: 02/14/2019 4:16:27 PM By: Worthy Keeler PA-C Entered By: Worthy Keeler on 02/14/2019 16:16:26 -------------------------------------------------------------------------------- Debridement Details Patient Name: Date of Service: [name] 02/14/2019 3:00 PM Medical Record [id number] Patient Account Number: [id number] Date of Birth/Sex: Treating RN: 10/14/1945 (74 y.o. Elam Dutch Primary Care Provider: BRAKE, Mitzi Hansen Other Clinician: Referring Provider: Treating Provider/Extender:Stone III, Lyndee Hensen, [name] in Treatment: 5 Debridement Performed for Wound #1 Right,Lateral Lower Leg Assessment: Performed By: Physician Worthy Keeler, PA Debridement Type: Debridement Severity of Tissue Pre Fat layer exposed Debridement: Level of Consciousness (Pre- Awake and Alert procedure): Pre-procedure Verification/Time Out Taken: Yes - 17:07 Start Time: 17:08 Pain Control: Other : benzocaine 20% spray Total Area Debrided (L x W): 4 (cm) x 1.7 (cm) = 6.8 (cm) Tissue and other material Viable, Non-Viable, Slough, Subcutaneous, Skin: Epidermis, Slough debrided: Level: Skin/Subcutaneous Tissue Debridement Description: Excisional Instrument: Curette Bleeding: Minimum Hemostasis Achieved: Pressure End Time: 17:12 Procedural Pain: 5 Post Procedural Pain:  4 Response to Treatment: Procedure was tolerated well Level of Consciousness Awake and Alert (Post-procedure): Post Debridement Measurements of Total Wound Length: (cm) 4 Width: (cm) 1.7 Depth: (cm) 0.4 Volume: (cm) 2.136 Character of Wound/Ulcer Post Requires Further Debridement Debridement: Severity of Tissue Post Debridement: Fat layer exposed Post Procedure Diagnosis Same as Pre-procedure Electronic Signature(s) Signed: 02/14/2019 6:03:18 PM By: Baruch Gouty RN, BSN Signed: 02/14/2019 7:03:16 PM By: Worthy Keeler PA-C Entered By: Baruch Gouty on 02/14/2019 17:10:58 -------------------------------------------------------------------------------- HPI Details Patient Name: Date of Service: [name] 02/14/2019 3:00 PM Medical Record [id number] Patient Account Number: [id number] Date of Birth/Sex: Treating RN: August 14, 1945 (74 y.o. F) Primary Care Provider: BRAKE, ANDREW Other Clinician: Referring Provider: Treating Provider/Extender:Stone III, Lyndee Hensen, [name] in Treatment: 5 History of Present Illness HPI Description: ADMISSION 01/05/2019 This is a 74 year old woman with advanced COPD on chronic O2. She managed to strike her lower leg while getting out of a car on Thanksgiving day. She had a wound. The vent they tried to look after themselves at home eventually saw her primary doctor a week later. She was given Keflex and Neosporin. An x-ray was apparently done but I do not have these results. She was seen in the ER last night and given doxycycline this appointment was arranged. The patient has a 3.5 x 2 surface area of black eschar and wound. Swelling inferiorly suggestive of a hematoma. I see no current evidence of infection Past medical history type 2 diabetes recent hemoglobin A1c of 7.6 in April. Chronic COPD the on oxygen. 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 ABI in this clinic was 1.3 on the right 12/18; the necrotic surface of this hematoma area remained intact which is somewhat surprising. The swelling around the area has gotten better. She is still having some discomfort managed with as needed tramadol 12/30-Patient returns with dense looking eschar on top of the hematoma on the right lateral leg calf area. Patient is on  O2 around-the-clock 02/07/2019 upon evaluation today patient presents for reevaluation in the clinic concerning issues with her right lower extremity ulcer around the calf. She had a hematoma here that was debrided away. She does have a tunnel at 2:00 and overall this seems to be doing okay though there is some sign of infection at this point there is some erythema and warmth to touch around the wound that has me somewhat concerned. Fortunately there is no evidence of active infection systemically which is good news. The patient states in general that she is having some pain although this is not terrible she is okay with me attempting sharp debridement to clear away as much of the necrotic tissue as I can. 02/22/2019 upon evaluation today patient appears to be doing better with regard to her ulcer at this point. She has been tolerating the dressing changes without complication. I do feel like she is making good progress and I am very pleased in this regard. With that being said she is continuing to still have some discomfort but I do not feel like it is as bad as previous. I did review her culture today which revealed Staph epidermidis but I do not see really any need indicate any further antibiotics at this point it did show resistant to tetracycline but at the same time her wound appears to be doing better. I do not believe this is indicative of any type of infection at this point. My suggestion would be that she continue with her antibiotic for the time being to  completion. Electronic Signature(s) Signed: 02/14/2019 6:17:51 PM By: Worthy Keeler PA-C Entered By: Worthy Keeler on 02/14/2019 18:17:50 -------------------------------------------------------------------------------- Physical Exam Details Patient Name: Date of Service: DEBBYE, TIMBERMAN 02/14/2019 3:00 PM Medical Record [id number] Patient Account Number: [id number] Date of Birth/Sex: Treating RN: April 12, 1945 (74 y.o. F) Primary Care Provider: BRAKE, ANDREW Other Clinician: Referring Provider: Treating Provider/Extender:Stone III, Waylon Koffler BRAKE, [name] in Treatment: 5 Constitutional Well-nourished and well-hydrated in no acute distress. Respiratory normal breathing without difficulty. Patient is on oxygen by mask. That is standard for her.Marland Kitchen Psychiatric this patient is able to make decisions and demonstrates good insight into disease process. Alert and Oriented x 3. pleasant and cooperative. Notes Patient's wound bed did require sharp debridement today and this was performed in the office without any significant pain I was able to clear away necrotic tissue from the surface of the wound and this was down to good subcutaneous tissue she did have some bleeding this was minimal however which is good news and she again she did not have significant pain with the debridement procedure which is also good news. Electronic Signature(s) Signed: 02/14/2019 6:18:22 PM By: Worthy Keeler PA-C Entered By: Worthy Keeler on 02/14/2019 18:18:22 -------------------------------------------------------------------------------- Physician Orders Details Patient Name: Date of Service: JAZIEL, GASPARIAN 02/14/2019 3:00 PM Medical Record [id number] Patient Account Number: [id number] Date of Birth/Sex: Treating RN: [birth year] (74 y.o. Elam Dutch Primary Care Provider: Jillyn Ledger Other Clinician: Referring Provider: Treating Provider/Extender:Stone III, Lyndee Hensen,  [name] in Treatment: 5 Verbal / Phone Orders: No Diagnosis Coding ICD-10 Coding Code Description [id number] Non-pressure chronic ulcer of right calf with other specified severity S80.11XD Contusion of right lower leg, subsequent encounter Follow-up Appointments Return Appointment in 1 week. Dressing Change Frequency Do not change entire dressing for one week. Skin Barriers/Peri-Wound Care Barrier cream Moisturizing lotion Wound Cleansing May shower with protection. - can use cast protector Primary Wound Dressing Wound #1 Right,Lateral Lower  Leg Calcium Alginate with Silver - pack lightly into undermining at 3 o'clock Secondary Dressing Wound #1 Right,Lateral Lower Leg Dry Gauze ABD pad Edema Control 3 Layer Compression System - Right Lower Extremity Avoid standing for long periods of time Elevate legs to the level of the heart or above for 30 minutes daily and/or when sitting, a frequency of: Exercise regularly Patient Medications Allergies: Januvia Notifications Medication Indication Start End benzocaine prior to 02/14/2019 debridement DOSE topical 20 % aerosol - aerosol topical Electronic Signature(s) Signed: 02/14/2019 6:03:18 PM By: Baruch Gouty RN, BSN Signed: 02/14/2019 7:03:16 PM By: Worthy Keeler PA-C Entered By: Baruch Gouty on 02/14/2019 17:13:18 -------------------------------------------------------------------------------- Problem List Details Patient Name: Date of Service: [name] 02/14/2019 3:00 PM Medical Record [id number] Patient Account Number: [id number] Date of Birth/Sex: Treating RN: Jun 01, 1945 (74 y.o. F) Primary Care Provider: BRAKE, ANDREW Other Clinician: Referring Provider: Treating Provider/Extender:Stone III, Lyndee Hensen, [name] in Treatment: 5 Active Problems ICD-10 Evaluated Encounter Code Description Active Date Today Diagnosis L97.218 Non-pressure chronic ulcer of right calf with other 01/05/2019 No  Yes specified severity S80.11XD Contusion of right lower leg, subsequent encounter 01/05/2019 No Yes Inactive Problems Resolved Problems Electronic Signature(s) Signed: 02/14/2019 4:16:20 PM By: Worthy Keeler PA-C Entered By: Worthy Keeler on 02/14/2019 16:16:19 -------------------------------------------------------------------------------- Progress Note Details Patient Name: Date of Service: [name] 02/14/2019 3:00 PM Medical Record [id number] Patient Account Number: [id number] Date of Birth/Sex: Treating RN: July 27, 1945 (74 y.o. F) Primary Care Provider: Jillyn Ledger Other Clinician: Referring Provider: Treating Provider/Extender:Stone III, Lyndee Hensen, [name] in Treatment: 5 Subjective Chief Complaint Information obtained from Patient 01/05/2019; patient arrives in clinic for review of contusion on the right lateral calf area History of Present Illness (HPI) ADMISSION 01/05/2019 This is a 74 year old woman with advanced COPD on chronic O2. She managed to strike her lower leg while getting out of a car on Thanksgiving day. She had a wound. The vent they tried to look after themselves at home eventually saw her primary doctor a week later. She was given Keflex and Neosporin. An x-ray was apparently done but I do not have these results. She was seen in the ER last night and given doxycycline this appointment was arranged. The patient has a 3.5 x 2 surface area of black eschar and wound. Swelling inferiorly suggestive of a hematoma. I see no current evidence of infection Past medical history type 2 diabetes recent hemoglobin A1c of 7.6 in April. Chronic COPD the on oxygen. 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 ABI in this clinic was 1.3 on the right 12/18; the necrotic surface of this hematoma area remained intact which is  somewhat surprising. The swelling around the area has gotten better. She is still having some discomfort managed with as needed tramadol 12/30-Patient returns with dense looking eschar on top of the hematoma on the right lateral leg calf area. Patient is on O2 around-the-clock 02/07/2019 upon evaluation today patient presents for reevaluation in the clinic concerning issues with her right lower extremity ulcer around the calf. She had a hematoma here that was debrided away. She does have a tunnel at 2:00 and overall this seems to be doing okay though there is some sign of infection at this point there is some erythema and warmth to touch around the wound that has me somewhat concerned. Fortunately there is no evidence of active infection systemically which is good news. The  patient states in general that she is having some pain although this is not terrible she is okay with me attempting sharp debridement to clear away as much of the necrotic tissue as I can. 02/22/2019 upon evaluation today patient appears to be doing better with regard to her ulcer at this point. She has been tolerating the dressing changes without complication. I do feel like she is making good progress and I am very pleased in this regard. With that being said she is continuing to still have some discomfort but I do not feel like it is as bad as previous. I did review her culture today which revealed Staph epidermidis but I do not see really any need indicate any further antibiotics at this point it did show resistant to tetracycline but at the same time her wound appears to be doing better. I do not believe this is indicative of any type of infection at this point. My suggestion would be that she continue with her antibiotic for the time being to completion. Objective Constitutional Well-nourished and well-hydrated in no acute distress. Vitals Time Taken: 3:57 PM, Height: 62 in, Source: Stated, Temperature: 98.9 F, Pulse:  88 bpm, Respiratory Rate: 22 breaths/min, Blood Pressure: 122/90 mmHg. Respiratory normal breathing without difficulty. Patient is on oxygen by mask. That is standard for her.Marland Kitchen Psychiatric this patient is able to make decisions and demonstrates good insight into disease process. Alert and Oriented x 3. pleasant and cooperative. General Notes: Patient's wound bed did require sharp debridement today and this was performed in the office without any significant pain I was able to clear away necrotic tissue from the surface of the wound and this was down to good subcutaneous tissue she did have some bleeding this was minimal however which is good news and she again she did not have significant pain with the debridement procedure which is also good news. Integumentary (Hair, Skin) Wound #1 status is Open. Original cause of wound was Trauma. The wound is located on the Right,Lateral Lower Leg. The wound measures 4cm length x 1.7cm width x 0.4cm depth; 5.341cm^2 area and 2.136cm^3 volume. There is Fat Layer (Subcutaneous Tissue) Exposed exposed. There is no tunneling or undermining noted. There is a medium amount of serosanguineous drainage noted. The wound margin is flat and intact. There is medium (34-66%) granulation within the wound bed. There is a medium (34-66%) amount of necrotic tissue within the wound bed including Adherent Slough. 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 There was a Excisional Skin/Subcutaneous Tissue Debridement with a total area of 6.8 sq cm performed by Worthy Keeler, PA. With the following instrument(s): Curette to remove Viable and Non-Viable tissue/material. Material removed  includes Subcutaneous Tissue, Slough, and Skin: Epidermis after achieving pain control using Other (benzocaine 20% spray). No specimens were taken. A time out was conducted at 17:07, prior to the start of the procedure. A Minimum amount of bleeding was controlled with Pressure. The procedure was tolerated well with a pain level of 5 throughout and a pain level of 4 following the procedure. Post Debridement Measurements: 4cm length x 1.7cm width x 0.4cm depth; 2.136cm^3 volume. Character of Wound/Ulcer Post Debridement requires further debridement. Severity of Tissue Post Debridement is: Fat layer exposed. 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 There was a Three Layer Compression Therapy Procedure by Carlene Coria, RN. Post procedure Diagnosis Wound #1: Same as Pre-Procedure Plan Follow-up Appointments: Return Appointment in 1 week. Dressing Change Frequency: Do not change entire dressing for one week. Skin Barriers/Peri-Wound Care: Barrier cream Moisturizing lotion Wound Cleansing: May shower with protection. - can use cast protector Primary Wound Dressing: Wound #1 Right,Lateral Lower Leg: Calcium Alginate with Silver - pack lightly into undermining at 3 o'clock Secondary Dressing: Wound #1 Right,Lateral Lower Leg: Dry Gauze ABD pad Edema Control: 3 Layer Compression System - Right Lower Extremity Avoid standing for long periods of time Elevate legs to the level of the heart or above for 30 minutes daily and/or when sitting, a frequency of: Exercise regularly The following medication(s) was prescribed: benzocaine topical 20 % aerosol aerosol topical for prior to debridement was prescribed at facility 1. My suggestion at this time is good to be that we go ahead and continue with the current wound care measures which includes a silver alginate dressing I think that is  doing well for her. 2. I am also going to suggest that we continue with ABD pad to cover and then subsequently a 3 layer compression wrap to the right lower extremity which is doing quite well. We will see patient back for reevaluation in 1 week here in the clinic. If anything worsens or changes patient will contact our office for additional recommendations. Electronic Signature(s) Signed: 02/14/2019 6:18:50 PM By: Worthy Keeler PA-C Entered By: Worthy Keeler on 02/14/2019 18:18:50 -------------------------------------------------------------------------------- SuperBill Details Patient Name: Date of Service: [name] 02/14/2019 Medical Record [id number] Patient Account Number: [id number] Date of Birth/Sex: Treating RN: 02/27/1945 (74 y.o. Elam Dutch Primary Care Provider: Jillyn Ledger Other Clinician: Referring Provider: Treating Provider/Extender:Stone III, Lyndee Hensen, [name] in Treatment: 5 Diagnosis Coding ICD-10 Codes Code Description [phone] Non-pressure chronic ulcer of right calf with other specified severity S80.11XD Contusion of right lower leg, subsequent encounter Facility Procedures CPT4 Code Description: [id number] - DEB SUBQ TISSUE 20 SQ CM/< ICD-10 Diagnosis Description [id number] Non-pressure chronic ulcer of right calf with other spec Modifier: ified severity Quantity: 1 Physician Procedures CPT4 Code Description: [id number] [id number] - WC PHYS SUBQ TISS 20 SQ CM ICD-10 Diagnosis Description [id number] Non-pressure chronic ulcer of right calf with other spec Modifier: ified severity Quantity: 1 Electronic Signature(s) Signed: 02/14/2019 6:19:01 PM By: Worthy Keeler PA-C Previous Signature: 02/14/2019 6:03:18 PM Version By: Baruch Gouty RN, BSN Entered By: Worthy Keeler on 02/14/2019 18:19:01

## 2019-02-14 NOTE — Progress Notes (Addendum)
DANENE, SANTAMARINA ([id number]) Visit Report for 02/14/2019 Arrival Information Details Patient Name: Date of Service: [name], [name] 02/14/2019 3:00 PM Medical Record [id number] Patient Account Number: [id number] Date of Birth/Sex: Treating RN: [birth year] (74 y.o. [name] Primary Care Kambria Grima: BRAKE, ANDREW Other Clinician: Referring Shelden Raborn: Treating Sanjuanita Condrey/Extender:Stone III, Lyndee Hensen, [name] in Treatment: 5 Visit Information History Since Last Visit All ordered tests and consults were completed: No Patient Arrived: Wheel Chair Added or deleted any medications: No Arrival Time: 15:56 Any new allergies or adverse reactions: No Accompanied By: son Had a fall or experienced change in No activities of daily living that may affect Transfer Assistance: Manual risk of falls: Patient Identification Verified: Yes Signs or symptoms of abuse/neglect since last No Secondary Verification Process Completed: Yes visito Patient Requires Transmission-Based No Hospitalized since last visit: No Precautions: Implantable device outside of the clinic excluding No Patient Has Alerts: No cellular tissue based products placed in the center since last visit: Has Dressing in Place as Prescribed: Yes Has Compression in Place as Prescribed: Yes Pain Present Now: No Electronic Signature(s) Signed: 02/14/2019 5:42:11 PM By: Carlene Coria RN Entered By: Carlene Coria on 02/14/2019 15:57:21 -------------------------------------------------------------------------------- Compression Therapy Details Patient Name: Date of Service: [name], [name] 02/14/2019 3:00 PM Medical Record [id number] Patient Account Number: [id number] Date of Birth/Sex: Treating RN: Nov 12, 1945 (74 y.o. Elam Dutch Primary Care Lavarius Doughten: Jillyn Ledger Other Clinician: Referring Ezzie Senat: Treating Burna Atlas/Extender:Stone III, Lyndee Hensen, [name] in Treatment: 5 Compression Therapy  Performed for Wound Wound #1 Right,Lateral Lower Leg Assessment: Performed By: Clinician Carlene Coria, RN Compression Type: Three Layer Post Procedure Diagnosis Same as Pre-procedure Electronic Signature(s) Signed: 02/14/2019 6:03:18 PM By: Baruch Gouty RN, BSN Entered By: Baruch Gouty on 02/14/2019 17:07:15 -------------------------------------------------------------------------------- Lower Extremity Assessment Details Patient Name: Date of Service: [name], [name] 02/14/2019 3:00 PM Medical Record [id number] Patient Account Number: [id number] Date of Birth/Sex: Treating RN: 12/01/1945 (74 y.o. [name] Primary Care Mariza Bourget: Jillyn Ledger Other Clinician: Referring Malene Blaydes: Treating Geraldene Eisel/Extender:Stone III, Lyndee Hensen, [name] in Treatment: 5 Edema Assessment Assessed: [Left: No] [Right: No] Edema: [Left: Ye] [Right: s] Calf Left: Right: Point of Measurement: 28 cm From Medial Instep cm 38.5 cm Ankle Left: Right: Point of Measurement: 10 cm From Medial Instep cm 21.8 cm Electronic Signature(s) Signed: 02/14/2019 5:42:11 PM By: Carlene Coria RN Entered By: Carlene Coria on 02/14/2019 16:07:19 -------------------------------------------------------------------------------- Multi-Disciplinary Care Plan Details Patient Name: Date of Service: [name] 02/14/2019 3:00 PM Medical Record [id number] Patient Account Number: [id number] Date of Birth/Sex: Treating RN: 1945-08-17 (74 y.o. Elam Dutch Primary Care Yani Coventry: Jillyn Ledger Other Clinician: Referring Dameion Briles: Treating Deaglan Lile/Extender:Stone III, Lyndee Hensen, [name] in Treatment: 5 Active Inactive Venous Leg Ulcer Nursing Diagnoses: Potential for venous Insuffiency (use before diagnosis confirmed) Goals: Patient will maintain optimal edema control Date Initiated: 02/07/2019 Target Resolution Date: 03/07/2019 Goal Status: Active Interventions: Assess  peripheral edema status every visit. Compression as ordered Provide education on venous insufficiency Treatment Activities: Therapeutic compression applied : 02/07/2019 Notes: Wound/Skin Impairment Nursing Diagnoses: Impaired tissue integrity Goals: Patient/caregiver will verbalize understanding of skin care regimen Date Initiated: 02/07/2019 Target Resolution Date: 03/07/2019 Goal Status: Active Ulcer/skin breakdown will have a volume reduction of 30% by week 4 Date Initiated: 01/05/2019 Date Inactivated: 02/07/2019 Target Resolution Date: 02/02/2019 Unmet Reason: hematoma Goal Status: Unmet unroofed Ulcer/skin breakdown will have a volume reduction of 50% by week 8 Date Initiated: 02/07/2019 Target Resolution Date: 03/07/2019 Goal Status: Active Interventions: Provide education  on smoking Provide education on ulcer and skin care Notes: Electronic Signature(s) Signed: 02/14/2019 6:03:18 PM By: Baruch Gouty RN, BSN Entered By: Baruch Gouty on 02/14/2019 17:03:45 -------------------------------------------------------------------------------- Pain Assessment Details Patient Name: Date of Service: [name] 02/14/2019 3:00 PM Medical Record [id number] Patient Account Number: [id number] Date of Birth/Sex: Treating RN: 1945/05/07 (74 y.o. [name] Primary Care Inita Uram: Jillyn Ledger Other Clinician: Referring Jaquel Coomer: Treating Margree Gimbel/Extender:Stone III, Lyndee Hensen, [name] in Treatment: 5 Active Problems Location of Pain Severity and Description of Pain Patient Has Paino No Site Locations Pain Management and Medication Current Pain Management: Electronic Signature(s) Signed: 02/14/2019 5:42:11 PM By: Carlene Coria RN Entered By: Carlene Coria on 02/14/2019 15:58:19 -------------------------------------------------------------------------------- Patient/Caregiver Education Details Patient Name: Date of Service: [name], Sally A.  1/20/2021andnbsp3:00 PM Medical Record [id number] Patient Account Number: [id number] Date of Birth/Gender: Treating RN: 1945/09/08 (73 y.o. Elam Dutch Primary Care Physician: Jillyn Ledger Other Clinician: Referring Physician: Treating Physician/Extender:Stone III, Lyndee Hensen, [name] in Treatment: 5 Education Assessment Education Provided To: Patient Education Topics Provided Venous: Methods: Explain/Verbal Responses: Reinforcements needed, State content correctly Wound/Skin Impairment: Methods: Explain/Verbal Responses: Reinforcements needed, State content correctly Electronic Signature(s) Signed: 02/14/2019 6:03:18 PM By: Baruch Gouty RN, BSN Entered By: Baruch Gouty on 02/14/2019 17:05:26 -------------------------------------------------------------------------------- Wound Assessment Details Patient Name: Date of Service: [name] 02/14/2019 3:00 PM Medical Record [id number] Patient Account Number: [id number] Date of Birth/Sex: Treating RN: 04-27-1945 (74 y.o. [name] Primary Care Veatrice Eckstein: BRAKE, ANDREW Other Clinician: Referring Lissete Maestas: Treating Lenny Bouchillon/Extender:Stone III, Lyndee Hensen, [name] in Treatment: 5 Wound Status Wound Number: 1 Primary Venous Leg Ulcer Etiology: Wound Location: Right Lower Leg - Lateral Wound Open Wounding Event: Trauma Status: Date Acquired: 12/21/2018 Comorbid Glaucoma, Chronic Obstructive Pulmonary Weeks Of Treatment: 5 History: Disease (COPD), Hypertension, Type II Clustered Wound: No Diabetes, Osteoarthritis, Osteomyelitis, Neuropathy, Confinement Anxiety Photos Wound Measurements Length: (cm) 4 % Reduct Width: (cm) 1.7 % Reduct Depth: (cm) 0.4 Epitheli Area: (cm) 5.341 Tunneli Volume: (cm) 2.136 Undermi Wound Description Full Thickness Without Exposed Support Foul Od Classification: Structures Slough/ Wound Flat and  Intact Margin: Exudate Medium Medium Amount: Exudate Serosanguineous Type: Exudate red, brown Color: Wound Bed Granulation Amount: Medium (34-66%) Necrotic Amount: Medium (34-66%) Fascia E Necrotic Quality: Adherent Slough Fat Laye Tendon E Muscle E Joint Ex Bone Exp or After Cleansing: No Fibrino Yes Exposed Structure xposed: No r (Subcutaneous Tissue) Exposed: Yes xposed: No xposed: No posed: No osed: No ion in Area: 2.9% ion in Volume: -288.4% alization: Small (1-33%) ng: No ning: No Electronic Signature(s) Signed: 02/15/2019 4:35:12 PM By: Mikeal Hawthorne EMT/HBOT Signed: 02/19/2019 6:01:30 PM By: Carlene Coria RN Previous Signature: 02/14/2019 5:42:11 PM Version By: Carlene Coria RN Entered By: Mikeal Hawthorne on 02/15/2019 13:28:57 -------------------------------------------------------------------------------- Vitals Details Patient Name: Date of Service: [name] 02/14/2019 3:00 PM Medical Record [id number] Patient Account Number: [id number] Date of Birth/Sex: Treating RN: [birth year] (74 y.o. F) [place], [place] Primary Care Alazay Leicht: BRAKE, ANDREW Other Clinician: Referring Brayn Eckstein: Treating Sayre Mazor/Extender:Stone III, Lyndee Hensen, [name] in Treatment: 5 Vital Signs Time Taken: 15:57 Temperature (F): 98.9 Height (in): 62 Pulse (bpm): 88 Source: Stated Respiratory Rate (breaths/min): 22 Blood Pressure (mmHg): 122/90 Reference Range: 80 - 120 mg / dl Electronic Signature(s) Signed: 02/14/2019 5:42:11 PM By: Carlene Coria RN Entered By: Carlene Coria on 02/14/2019 15:58:12

## 2019-02-21 ENCOUNTER — Encounter (HOSPITAL_BASED_OUTPATIENT_CLINIC_OR_DEPARTMENT_OTHER): Payer: Medicare Other | Admitting: Physician Assistant

## 2019-02-21 ENCOUNTER — Other Ambulatory Visit: Payer: Self-pay

## 2019-02-21 DIAGNOSIS — L97219 Non-pressure chronic ulcer of right calf with unspecified severity: Secondary | ICD-10-CM | POA: Diagnosis not present

## 2019-02-21 DIAGNOSIS — E1140 Type 2 diabetes mellitus with diabetic neuropathy, unspecified: Secondary | ICD-10-CM | POA: Diagnosis not present

## 2019-02-21 DIAGNOSIS — I872 Venous insufficiency (chronic) (peripheral): Secondary | ICD-10-CM | POA: Diagnosis not present

## 2019-02-21 DIAGNOSIS — F419 Anxiety disorder, unspecified: Secondary | ICD-10-CM | POA: Diagnosis not present

## 2019-02-21 DIAGNOSIS — M810 Age-related osteoporosis without current pathological fracture: Secondary | ICD-10-CM | POA: Diagnosis not present

## 2019-02-21 DIAGNOSIS — L97212 Non-pressure chronic ulcer of right calf with fat layer exposed: Secondary | ICD-10-CM | POA: Diagnosis not present

## 2019-02-21 DIAGNOSIS — E11622 Type 2 diabetes mellitus with other skin ulcer: Secondary | ICD-10-CM | POA: Diagnosis not present

## 2019-02-21 DIAGNOSIS — E785 Hyperlipidemia, unspecified: Secondary | ICD-10-CM | POA: Diagnosis not present

## 2019-02-21 NOTE — Progress Notes (Addendum)
TUESDAY, SILVIS ([id number]) Visit Report for 02/21/2019 Chief Complaint Document Details Patient Name: Date of Service: Sally, Duran 02/21/2019 3:15 PM Medical Record [id number] Patient Account Number: [id number] Date of Birth/Sex: Treating RN: 1945-01-27 (74 y.o. F) Primary Care Provider: BRAKE, Duran Other Clinician: Referring Provider: Treating Provider/Extender:[name], Duran Weeks in Treatment: 6 Information Obtained from: Patient Chief Complaint 01/05/2019; patient arrives in clinic for review of contusion on the right lateral calf area Electronic Signature(s) Signed: 02/21/2019 3:29:49 PM By: Sally Keeler PA-C Entered By: [name] on 02/21/2019 15:29:48 -------------------------------------------------------------------------------- HPI Details Patient Name: Date of Service: [name] 02/21/2019 3:15 PM Medical Record [id number] Patient Account Number: [id number] Date of Birth/Sex: Treating RN: 02-20-1945 (74 y.o. F) Primary Care Provider: BRAKE, Duran Other Clinician: Referring Provider: Treating Provider/Extender:[name], Duran Weeks in Treatment: 6 History of Present Illness HPI Description: ADMISSION 01/05/2019 This is a 74 year old woman with advanced COPD on chronic O2. She managed to strike her lower leg while getting out of a car on Thanksgiving day. She had a wound. The vent they tried to look after themselves at home eventually saw her primary doctor a week later. She was given Keflex and Neosporin. An x-ray was apparently done but I do not have these results. She was seen in the ER last night and given doxycycline this appointment was arranged. The patient has a 3.5 x 2 surface area of black eschar and wound. Swelling inferiorly suggestive of a hematoma. I see no current evidence of infection Past medical history type 2 diabetes recent hemoglobin A1c of 7.6 in April. Chronic COPD the on  oxygen. 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 ABI in this clinic was 1.3 on the right 12/18; the necrotic surface of this hematoma area remained intact which is somewhat surprising. The swelling around the area has gotten better. She is still having some discomfort managed with as needed tramadol 12/30-Patient returns with dense looking eschar on top of the hematoma on the right lateral leg calf area. Patient is on O2 around-the-clock 02/07/2019 upon evaluation today patient presents for reevaluation in the clinic concerning issues with her right lower extremity ulcer around the calf. She had a hematoma here that was debrided away. She does have a tunnel at 2:00 and overall this seems to be doing okay though there is some sign of infection at this point there is some erythema and warmth to touch around the wound that has me somewhat concerned. Fortunately there is no evidence of active infection systemically which is good news. The patient states in general that she is having some pain although this is not terrible she is okay with me attempting sharp debridement to clear away as much of the necrotic tissue as I can. 02/22/2019 upon evaluation today patient appears to be doing better with regard to her ulcer at this point. She has been tolerating the dressing changes without complication. I do feel like she is making good progress and I am very pleased in this regard. With that being said she is continuing to still have some discomfort but I do not feel like it is as bad as previous. I did review her culture today which revealed Staph epidermidis but I do not see really any need indicate any further antibiotics at this point it did show resistant to tetracycline but at the same time her wound appears to be doing better.  I do not believe this is indicative of any type  of infection at this point. My suggestion would be that she continue with her antibiotic for the time being to completion. 02/21/2019 on evaluation today patient appears to be doing well with regard to her leg ulcer. She is showing some signs of good granulation. With that being said unfortunately this is still somewhat slow to heal she still does have some depth. I do believe that potentially a snap VAC could be of benefit for her this was discussed with the patient and her son today as well it was a question they initially brought up about a wound VAC. I believe that it may actually be an excellent option for her and in fact would probably speed up the recovery quite a bit. She is actually very pleased to hear this and would like to proceed if at all possible with that today. Electronic Signature(s) Signed: 02/21/2019 4:49:28 PM By: Sally Keeler PA-C Entered By: [name] on 02/21/2019 16:49:27 -------------------------------------------------------------------------------- Physical Exam Details Patient Name: Date of Service: [name] 02/21/2019 3:15 PM Medical Record [id number] Patient Account Number: [id number] Date of Birth/Sex: Treating RN: 11/30/1945 (74 y.o. F) Primary Care Provider: BRAKE, Duran Other Clinician: Referring Provider: Treating Provider/Extender:Sally III, Kenyah Duran [name] Weeks in Treatment: 6 Constitutional Well-nourished and well-hydrated in no acute distress. Respiratory normal breathing without difficulty on oxygen. Psychiatric this patient is able to make decisions and demonstrates good insight into disease process. Alert and Oriented x 3. pleasant and cooperative. Notes Patient's wound bed currently showed signs of good granulation at this time. Fortunately there is no evidence of significant slough buildup I was able to mechanically clean this away today no sharp debridement was necessary. She did have a little bit of discomfort  nothing too dramatic however which is good news. Electronic Signature(s) Signed: 02/21/2019 4:49:58 PM By: Sally Keeler PA-C Entered By: [name] on 02/21/2019 16:49:56 -------------------------------------------------------------------------------- Physician Orders Details Patient Name: Date of Service: [name] 02/21/2019 3:15 PM Medical Record [id number] Patient Account Number: [id number] Date of Birth/Sex: Treating RN: 1945-05-11 (74 y.o. Elam Dutch Primary Care Provider: Jillyn Ledger Other Clinician: Referring Provider: Treating Provider/Extender:[name], Duran Weeks in Treatment: 6 Verbal / Phone Orders: No Diagnosis Coding ICD-10 Coding Code Description [id number] Non-pressure chronic ulcer of right calf with other specified severity S80.11XD Contusion of right lower leg, subsequent encounter Follow-up Appointments Return Appointment in 1 week. Dressing Change Frequency Do not change entire dressing for one week. Skin Barriers/Peri-Wound Care Skin Prep Wound Cleansing May shower with protection. - can use cast protector Negative Presssure Wound Therapy SNAP Vac to wound continuously at 117mm/hg pressure - tuck into undermined area at 1 o'clock Edema Control 3 Layer Compression System - Right Lower Extremity Avoid standing for long periods of time Elevate legs to the level of the heart or above for 30 minutes daily and/or when sitting, a frequency of: Exercise regularly Electronic Signature(s) Signed: 02/21/2019 6:10:09 PM By: Sally Keeler PA-C Signed: 02/21/2019 6:17:53 PM By: Baruch Gouty RN, BSN Entered By: Baruch Gouty on 02/21/2019 16:47:55 -------------------------------------------------------------------------------- Problem List Details Patient Name: Date of Service: [name] 02/21/2019 3:15 PM Medical Record [id number] Patient Account Number: [id number] Date of Birth/Sex: Treating  RN: 07-11-1945 (74 y.o. F) Primary Care Provider: BRAKE, Duran Other Clinician: Referring Provider: Treating Provider/Extender:[name], Duran Weeks in Treatment: 6 Active Problems ICD-10 Evaluated Encounter Code Description Active Date Today Diagnosis L97.218  Non-pressure chronic ulcer of right calf with other 01/05/2019 No Yes specified severity S80.11XD Contusion of right lower leg, subsequent encounter 01/05/2019 No Yes Inactive Problems Resolved Problems Electronic Signature(s) Signed: 02/21/2019 3:29:43 PM By: Sally Keeler PA-C Entered By: [name] on 02/21/2019 15:29:42 -------------------------------------------------------------------------------- Progress Note Details Patient Name: Date of Service: [name] 02/21/2019 3:15 PM Medical Record [id number] Patient Account Number: [id number] Date of Birth/Sex: Treating RN: 1945/02/16 (74 y.o. F) Primary Care Provider: Jillyn Ledger Other Clinician: Referring Provider: Treating Provider/Extender:[name], Duran Weeks in Treatment: 6 Subjective Chief Complaint Information obtained from Patient 01/05/2019; patient arrives in clinic for review of contusion on the right lateral calf area History of Present Illness (HPI) ADMISSION 01/05/2019 This is a 74 year old woman with advanced COPD on chronic O2. She managed to strike her lower leg while getting out of a car on Thanksgiving day. She had a wound. The vent they tried to look after themselves at home eventually saw her primary doctor a week later. She was given Keflex and Neosporin. An x-ray was apparently done but I do not have these results. She was seen in the ER last night and given doxycycline this appointment was arranged. The patient has a 3.5 x 2 surface area of black eschar and wound. Swelling inferiorly suggestive of a hematoma. I see no current evidence of infection Past medical history type 2 diabetes recent  hemoglobin A1c of 7.6 in April. Chronic COPD the on oxygen. 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 ABI in this clinic was 1.3 on the right 12/18; the necrotic surface of this hematoma area remained intact which is somewhat surprising. The swelling around the area has gotten better. She is still having some discomfort managed with as needed tramadol 12/30-Patient returns with dense looking eschar on top of the hematoma on the right lateral leg calf area. Patient is on O2 around-the-clock 02/07/2019 upon evaluation today patient presents for reevaluation in the clinic concerning issues with her right lower extremity ulcer around the calf. She had a hematoma here that was debrided away. She does have a tunnel at 2:00 and overall this seems to be doing okay though there is some sign of infection at this point there is some erythema and warmth to touch around the wound that has me somewhat concerned. Fortunately there is no evidence of active infection systemically which is good news. The patient states in general that she is having some pain although this is not terrible she is okay with me attempting sharp debridement to clear away as much of the necrotic tissue as I can. 02/22/2019 upon evaluation today patient appears to be doing better with regard to her ulcer at this point. She has been tolerating the dressing changes without complication. I do feel like she is making good progress and I am very pleased in this regard. With that being said she is continuing to still have some discomfort but I do not feel like it is as bad as previous. I did review her culture today which revealed Staph epidermidis but I do not see really any need indicate any further antibiotics at this point it did show resistant to tetracycline but at the same time her wound appears to be doing  better. I do not believe this is indicative of any type of infection at this point. My suggestion would be that she continue with her antibiotic for the time  being to completion. 02/21/2019 on evaluation today patient appears to be doing well with regard to her leg ulcer. She is showing some signs of good granulation. With that being said unfortunately this is still somewhat slow to heal she still does have some depth. I do believe that potentially a snap VAC could be of benefit for her this was discussed with the patient and her son today as well it was a question they initially brought up about a wound VAC. I believe that it may actually be an excellent option for her and in fact would probably speed up the recovery quite a bit. She is actually very pleased to hear this and would like to proceed if at all possible with that today. Objective Constitutional Well-nourished and well-hydrated in no acute distress. Vitals Time Taken: 3:52 PM, Height: 62 in, Temperature: 97.9 F, Pulse: 74 bpm, Respiratory Rate: 20 breaths/min, Blood Pressure: 92/77 mmHg, Capillary Blood Glucose: 180 mg/dl. General Notes: glucose per pt report Respiratory normal breathing without difficulty on oxygen. Psychiatric this patient is able to make decisions and demonstrates good insight into disease process. Alert and Oriented x 3. pleasant and cooperative. General Notes: Patient's wound bed currently showed signs of good granulation at this time. Fortunately there is no evidence of significant slough buildup I was able to mechanically clean this away today no sharp debridement was necessary. She did have a little bit of discomfort nothing too dramatic however which is good news. Integumentary (Hair, Skin) Wound #1 status is Open. Original cause of wound was Trauma. The wound is located on the Right,Lateral Lower Leg. The wound measures 3.7cm length x 1.8cm width x 0.7cm depth; 5.231cm^2 area and 3.662cm^3  volume. There is Fat Layer (Subcutaneous Tissue) Exposed exposed. Tunneling has been noted at 1:00 with a maximum distance of 2.6cm. Undermining begins at 3:00 and ends at 5:00 with a maximum distance of 4.2cm. There is a medium amount of serosanguineous drainage noted. The wound margin is flat and intact. There is large (67-100%) red granulation within the wound bed. There is a small (1-33%) amount of necrotic tissue within the wound bed including Adherent Slough. 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 There was a Three Layer Compression Therapy Procedure by Carlene Coria, RN. Post procedure Diagnosis Wound #1: Same as Pre-Procedure Plan Follow-up Appointments: Return Appointment in 1 week. Dressing Change Frequency: Do not change entire dressing for one week. Skin Barriers/Peri-Wound Care: Skin Prep Wound Cleansing: May shower with protection. - can use cast protector Negative Presssure Wound Therapy: SNAP Vac to wound continuously at 150mm/hg pressure - tuck into undermined area at 1 o'clock Edema Control: 3 Layer Compression System - Right Lower Extremity Avoid standing for long periods of time Elevate legs to the level of the heart or above for 30 minutes daily and/or when sitting, a frequency of: Exercise regularly 1. We are going to go ahead and initiate treatment with the snap VAC today I believe this is an excellent option for the patient she is excited to start treatment as such. 2. I am to recommend as well that we go ahead and continue with the compression wrap over top of the snap I believe this will keep things moving in the right direction as far as her edema control is concerned between the 2  I think were going to be helping this to  proceed quite appropriately as far as healing over the next week is concerned. 3. I am also going to recommend that the patient continue to elevate her legs as much as possible. We will see patient back for reevaluation in 1 week here in the clinic. If anything worsens or changes patient will contact our office for additional recommendations. Electronic Signature(s) Signed: 02/21/2019 4:50:42 PM By: Sally Keeler PA-C Entered By: [name] on 02/21/2019 16:50:41 -------------------------------------------------------------------------------- SuperBill Details Patient Name: Date of Service: [name] 02/21/2019 Medical Record [id number] Patient Account Number: [id number] Date of Birth/Sex: Treating RN: [birth year] (74 y.o. Elam Dutch Primary Care Provider: Jillyn Ledger Other Clinician: Referring Provider: Treating Provider/Extender:[name], Duran Weeks in Treatment: 6 Diagnosis Coding ICD-10 Codes Code Description [phone] Non-pressure chronic ulcer of right calf with other specified severity S80.11XD Contusion of right lower leg, subsequent encounter Facility Procedures CPT4 Code Description: [id number] (Facility Use Only) [id number] 1939 - APPLY MULTLAY COMPRS LWR RT LEG Modifier: 59 Quantity: 1 CPT4 Code Description: [id number] NEG PRESS WND TX <=50 SQ CM Modifier: Quantity: 1 Physician Procedures Electronic Signature(s) Signed: 02/21/2019 4:52:02 PM By: Sally Keeler PA-C Entered By: [name] on 02/21/2019 16:52:01

## 2019-02-26 NOTE — Progress Notes (Addendum)
XYLINA, LANDRENEAU ([id number]) Visit Report for 02/21/2019 Arrival Information Details Patient Name: Date of Service: [name], [name] 02/21/2019 3:15 PM Medical Record [id number] Patient Account Number: [id number] Date of Birth/Sex: Treating RN: August 25, 1945 (74 y.o. Nancy Fetter Primary Care Hernando Reali: BRAKE, ANDREW Other Clinician: Referring Abdulkadir Emmanuel: Treating Derk Doubek/Extender:Stone III, Lyndee Hensen, [name] in Treatment: 6 Visit Information History Since Last Visit Added or deleted any medications: No Patient Arrived: Wheel Chair Any new allergies or adverse reactions: No Arrival Time: 15:51 Had a fall or experienced change in No activities of daily living that may affect Accompanied By: son risk of falls: Transfer Assistance: None Signs or symptoms of abuse/neglect since last No Patient Identification Verified: Yes visito Secondary Verification Process Yes Hospitalized since last visit: No Completed: Implantable device outside of the clinic excluding No Patient Requires Transmission-Based No cellular tissue based products placed in the center Precautions: since last visit: Patient Has Alerts: No Has Dressing in Place as Prescribed: Yes Has Compression in Place as Prescribed: Yes Pain Present Now: Yes Electronic Signature(s) Signed: 02/26/2019 6:39:44 PM By: Levan Hurst RN, BSN Entered By: Levan Hurst on 02/21/2019 15:52:16 -------------------------------------------------------------------------------- Compression Therapy Details Patient Name: Date of Service: [name] 02/21/2019 3:15 PM Medical Record [id number] Patient Account Number: [id number] Date of Birth/Sex: Treating RN: April 14, 1945 (74 y.o. Elam Dutch Primary Care Shandricka Monroy: Jillyn Ledger Other Clinician: Referring Jaeanna Mccomber: Treating Sricharan Lacomb/Extender:Stone III, Lyndee Hensen, [name] in Treatment: 6 Compression Therapy Performed for Wound Wound #1 Right,Lateral  Lower Leg Assessment: Performed By: Clinician Carlene Coria, RN Compression Type: Three Layer Post Procedure Diagnosis Same as Pre-procedure Electronic Signature(s) Signed: 02/21/2019 6:17:53 PM By: Baruch Gouty RN, BSN Entered By: Baruch Gouty on 02/21/2019 16:41:42 -------------------------------------------------------------------------------- Encounter Discharge Information Details Patient Name: Date of Service: [name] 02/21/2019 3:15 PM Medical Record [id number] Patient Account Number: [id number] Date of Birth/Sex: Treating RN: May 15, 1945 (74 y.o. [name] Primary Care Josclyn Rosales: Jillyn Ledger Other Clinician: Referring Niomie Englert: Treating Zacharee Gaddie/Extender:Stone III, Lyndee Hensen, [name] in Treatment: 6 Encounter Discharge Information Items Discharge Condition: Stable Ambulatory Status: Wheelchair Discharge Destination: Home Transportation: Private Auto Accompanied By: son Schedule Follow-up Appointment: Yes Clinical Summary of Care: Patient Declined Electronic Signature(s) Signed: 02/21/2019 5:50:55 PM By: Carlene Coria RN Entered By: Carlene Coria on 02/21/2019 17:15:59 -------------------------------------------------------------------------------- Lower Extremity Assessment Details Patient Name: Date of Service: [name], [name] 02/21/2019 3:15 PM Medical Record [id number] Patient Account Number: [id number] Date of Birth/Sex: Treating RN: Oct 29, 1945 (74 y.o. Nancy Fetter Primary Care Santa Abdelrahman: BRAKE, ANDREW Other Clinician: Referring Junell Cullifer: Treating Reynolds Kittel/Extender:Stone III, Lyndee Hensen, [name] in Treatment: 6 Edema Assessment Assessed: [Left: No] [Right: No] Edema: [Left: Ye] [Right: s] Calf Left: Right: Point of Measurement: 28 cm From Medial Instep cm 37 cm Ankle Left: Right: Point of Measurement: 10 cm From Medial Instep cm 20.5 cm Vascular Assessment Pulses: Dorsalis Pedis Palpable:  [Right:Yes] Electronic Signature(s) Signed: 02/26/2019 6:39:44 PM By: Levan Hurst RN, BSN Entered By: Levan Hurst on 02/21/2019 15:55:27 -------------------------------------------------------------------------------- Multi-Disciplinary Care Plan Details Patient Name: Date of Service: [name] 02/21/2019 3:15 PM Medical Record [id number] Patient Account Number: [id number] Date of Birth/Sex: Treating RN: 1945/11/09 (74 y.o. Elam Dutch Primary Care Mehak Roskelley: Jillyn Ledger Other Clinician: Referring Kaylor Maiers: Treating Mahdi Frye/Extender:Stone III, Lyndee Hensen, [name] in Treatment: 6 Active Inactive Venous Leg Ulcer Nursing Diagnoses: Potential for venous Insuffiency (use before diagnosis confirmed) Goals: Patient will maintain optimal edema control Date Initiated: 02/07/2019 Target Resolution Date: 03/07/2019 Goal Status: Active Interventions: Assess peripheral  edema status every visit. Compression as ordered Provide education on venous insufficiency Treatment Activities: Therapeutic compression applied : 02/07/2019 Notes: Wound/Skin Impairment Nursing Diagnoses: Impaired tissue integrity Goals: Patient/caregiver will verbalize understanding of skin care regimen Date Initiated: 02/07/2019 Target Resolution Date: 03/07/2019 Goal Status: Active Ulcer/skin breakdown will have a volume reduction of 30% by week 4 Date Initiated: 01/05/2019 Date Inactivated: 02/07/2019 Target Resolution Date: 02/02/2019 Unmet Reason: hematoma Goal Status: Unmet unroofed Ulcer/skin breakdown will have a volume reduction of 50% by week 8 Date Initiated: 02/07/2019 Target Resolution Date: 03/07/2019 Goal Status: Active Interventions: Provide education on smoking Provide education on ulcer and skin care Notes: Electronic Signature(s) Signed: 02/21/2019 6:17:53 PM By: Baruch Gouty RN, BSN Entered By: Baruch Gouty on 02/21/2019  16:39:50 -------------------------------------------------------------------------------- Negative Pressure Wound Therapy Application (NPWT) Details Patient Name: Date of Service: Sally, Duran 02/21/2019 3:15 PM Medical Record [id number] Patient Account Number: [id number] Date of Birth/Sex: Treating RN: 24-Aug-1945 (74 y.o. Elam Dutch Primary Care Johnavon Mcclafferty: Jillyn Ledger Other Clinician: Referring Mandisa Persinger: Treating Braley Luckenbaugh/Extender:Stone III, Lyndee Hensen, [name] in Treatment: 6 NPWT Application Performed for: Wound #1 Right,Lateral Lower Leg Performed By: Carlene Coria, RN Type: Other Coverage Size (sq cm): 6.66 Pressure Type: Constant Pressure Setting: 125 mmHG Drain Type: None Quantity of Sponges/Gauze Inserted: 1 Sponge/Dressing Type: Foam, Green Date Initiated: 02/21/2019 Response to Treatment: good Post Procedure Diagnosis Same as Pre-procedure Electronic Signature(s) Signed: 02/21/2019 6:17:53 PM By: Baruch Gouty RN, BSN Entered By: Baruch Gouty on 02/21/2019 16:45:38 -------------------------------------------------------------------------------- Pain Assessment Details Patient Name: Date of Service: [name] 02/21/2019 3:15 PM Medical Record [id number] Patient Account Number: [id number] Date of Birth/Sex: Treating RN: Aug 04, 1945 (74 y.o. Nancy Fetter Primary Care Noelani Harbach: Jillyn Ledger Other Clinician: Referring Seaira Byus: Treating Jasir Rother/Extender:Stone III, Lyndee Hensen, [name] in Treatment: 6 Active Problems Location of Pain Severity and Description of Pain Patient Has Paino Yes Site Locations Pain Location: Pain in Ulcers With Dressing Change: Yes Duration of the Pain. Constant / Intermittento Intermittent Rate the pain. Current Pain Level: 4 Character of Pain Describe the Pain: Throbbing Pain Management and Medication Current Pain Management: Medication: Yes Cold Application: No Rest:  No Massage: No Activity: No T.E.N.S.: No Heat Application: No Leg drop or elevation: No Is the Current Pain Management Adequate: Adequate How does your wound impact your activities of daily livingo Sleep: No Bathing: No Appetite: No Relationship With Others: No Bladder Continence: No Emotions: No Bowel Continence: No Work: No Toileting: No Drive: No Dressing: No Hobbies: No Electronic Signature(s) Signed: 02/26/2019 6:39:44 PM By: Levan Hurst RN, BSN Entered By: Levan Hurst on 02/21/2019 15:52:58 -------------------------------------------------------------------------------- Patient/Caregiver Education Details Patient Name: Date of Service: [name] 1/27/2021andnbsp3:15 PM Medical Record [id number] Patient Account Number: [id number] Date of Birth/Gender: Treating RN: 1945-09-07 (73 y.o. Elam Dutch Primary Care Physician: Jillyn Ledger Other Clinician: Referring Physician: Treating Physician/Extender:Stone III, Lyndee Hensen, [name] in Treatment: 6 Education Assessment Education Provided To: Patient Education Topics Provided Venous: Methods: Explain/Verbal Responses: Reinforcements needed, State content correctly Wound/Skin Impairment: Methods: Explain/Verbal Responses: Reinforcements needed, State content correctly Electronic Signature(s) Signed: 02/21/2019 6:17:53 PM By: Baruch Gouty RN, BSN Entered By: Baruch Gouty on 02/21/2019 16:40:11 -------------------------------------------------------------------------------- Wound Assessment Details Patient Name: Date of Service: [name] 02/21/2019 3:15 PM Medical Record [id number] Patient Account Number: [id number] Date of Birth/Sex: Treating RN: 03-14-1945 (74 y.o. F) Primary Care Janesia Joswick: BRAKE, ANDREW Other Clinician: Referring Vona Whiters: Treating Lilianne Delair/Extender:Stone III, Hoyt BRAKE, [name] in Treatment: 6 Wound Status Wound Number: 1 Primary  Venous Leg Ulcer Etiology: Wound Location: Right Lower Leg - Lateral Wound Open Wounding Event: Trauma Status: Date Acquired: 12/21/2018 Comorbid Glaucoma, Chronic Obstructive Pulmonary Weeks Of Treatment: 6 History: Disease (COPD), Hypertension, Type II Clustered Wound: No Diabetes, Osteoarthritis, Osteomyelitis, Neuropathy, Confinement Anxiety Photos Wound Measurements Length: (cm) 3.7 Width: (cm) 1.8 Depth: (cm) 0.7 Area: (cm) 5.231 Volume: (cm) 3.662 % Reduction in Area: 4.9% % Reduction in Volume: -565.8% Epithelialization: Small (1-33%) Tunneling: Yes Position (o'clock): 1 Maximum Distance: (cm) 2.6 Undermining: Yes Starting Position (o'clock): 3 Ending Position (o'clock): 5 Maximum Distance: (cm) 4.2 Wound Description Classification: Full Thickness Without Exposed Support Foul Od Structures Slough/ Wound Flat and Intact Margin: Exudate Medium Amount: Exudate Serosanguineous Type: Exudate red, brown Color: Wound Bed Granulation Amount: Large (67-100%) Granulation Quality: Red Fascia E Necrotic Amount: Small (1-33%) Fat Laye Necrotic Quality: Adherent Slough Tendon E Muscle E Joint Ex Bone Exp or After Cleansing: No Fibrino Yes Exposed Structure xposed: No r (Subcutaneous Tissue) Exposed: Yes xposed: No xposed: No posed: No osed: No Treatment Notes Wound #1 (Right, Lateral Lower Leg) 1. Cleanse With Wound Cleanser Soap and water 2. Periwound Care Skin Prep 3. Primary Dressing Applied Other primary dressing (specifiy in notes) 4. Secondary Dressing Dry Gauze Notes snap vac, 144mmhg used 2 pieces of blue foam , netting. Electronic Signature(s) Signed: 02/27/2019 5:19:10 PM By: Mikeal Hawthorne EMT/HBOT Previous Signature: 02/26/2019 6:39:44 PM Version By: Levan Hurst RN, BSN Entered By: Mikeal Hawthorne on 02/27/2019 08:07:40 -------------------------------------------------------------------------------- Vitals Details Patient  Name: Date of Service: [name] 02/21/2019 3:15 PM Medical Record [id number] Patient Account Number: [id number] Date of Birth/Sex: Treating RN: 07-30-1945 (74 y.o. Nancy Fetter Primary Care Lysha Schrade: BRAKE, ANDREW Other Clinician: Referring Brodin Gelpi: Treating Mariyah Upshaw/Extender:Stone III, Lyndee Hensen, [name] in Treatment: 6 Vital Signs Time Taken: 15:52 Temperature (F): 97.9 Height (in): 62 Pulse (bpm): 74 Respiratory Rate (breaths/min): 20 Blood Pressure (mmHg): 92/77 Capillary Blood Glucose (mg/dl): 180 Reference Range: 80 - 120 mg / dl Notes glucose per pt report Electronic Signature(s) Signed: 02/26/2019 6:39:44 PM By: Levan Hurst RN, BSN Entered By: Levan Hurst on 02/21/2019 15:52:40

## 2019-02-27 DIAGNOSIS — K5900 Constipation, unspecified: Secondary | ICD-10-CM | POA: Diagnosis not present

## 2019-02-28 ENCOUNTER — Other Ambulatory Visit: Payer: Self-pay

## 2019-02-28 ENCOUNTER — Encounter (HOSPITAL_BASED_OUTPATIENT_CLINIC_OR_DEPARTMENT_OTHER): Payer: Medicare Other | Admitting: Physician Assistant

## 2019-02-28 DIAGNOSIS — L97212 Non-pressure chronic ulcer of right calf with fat layer exposed: Secondary | ICD-10-CM | POA: Diagnosis not present

## 2019-02-28 DIAGNOSIS — I872 Venous insufficiency (chronic) (peripheral): Secondary | ICD-10-CM | POA: Diagnosis not present

## 2019-02-28 NOTE — Progress Notes (Addendum)
IONIA, MLECZKO ([id number]) Visit Report for 02/28/2019 Chief Complaint Document Details Patient Name: Date of Service: Sally, Duran 02/28/2019 3:15 PM Medical Record [id number] Patient Account Number: [id number] Date of Birth/Sex: Treating RN: April 11, 1945 (74 y.o. F) Primary Care Provider: BRAKE, ANDREW Other Clinician: Referring Provider: Treating Provider/Extender:Stone III, Lyndee Hensen, [name] in Treatment: 7 Information Obtained from: Patient Chief Complaint 01/05/2019; patient arrives in clinic for review of contusion on the right lateral calf area Electronic Signature(s) Signed: 02/28/2019 3:55:27 PM By: Worthy Keeler PA-C Entered By: Worthy Keeler on 02/28/2019 15:55:27 -------------------------------------------------------------------------------- HPI Details Patient Name: Date of Service: [name] 02/28/2019 3:15 PM Medical Record [id number] Patient Account Number: [id number] Date of Birth/Sex: Treating RN: Apr 05, 1945 (74 y.o. F) Primary Care Provider: BRAKE, ANDREW Other Clinician: Referring Provider: Treating Provider/Extender:Stone III, Lyndee Hensen, [name] in Treatment: 7 History of Present Illness HPI Description: ADMISSION 01/05/2019 This is a 74 year old woman with advanced COPD on chronic O2. She managed to strike her lower leg while getting out of a car on Thanksgiving day. She had a wound. The vent they tried to look after themselves at home eventually saw her primary doctor a week later. She was given Keflex and Neosporin. An x-ray was apparently done but I do not have these results. She was seen in the ER last night and given doxycycline this appointment was arranged. The patient has a 3.5 x 2 surface area of black eschar and wound. Swelling inferiorly suggestive of a hematoma. I see no current evidence of infection Past medical history type 2 diabetes recent hemoglobin A1c of 7.6 in April. Chronic COPD the on  oxygen. 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 ABI in this clinic was 1.3 on the right 12/18; the necrotic surface of this hematoma area remained intact which is somewhat surprising. The swelling around the area has gotten better. She is still having some discomfort managed with as needed tramadol 12/30-Patient returns with dense looking eschar on top of the hematoma on the right lateral leg calf area. Patient is on O2 around-the-clock 02/07/2019 upon evaluation today patient presents for reevaluation in the clinic concerning issues with her right lower extremity ulcer around the calf. She had a hematoma here that was debrided away. She does have a tunnel at 2:00 and overall this seems to be doing okay though there is some sign of infection at this point there is some erythema and warmth to touch around the wound that has me somewhat concerned. Fortunately there is no evidence of active infection systemically which is good news. The patient states in general that she is having some pain although this is not terrible she is okay with me attempting sharp debridement to clear away as much of the necrotic tissue as I can. 02/22/2019 upon evaluation today patient appears to be doing better with regard to her ulcer at this point. She has been tolerating the dressing changes without complication. I do feel like she is making good progress and I am very pleased in this regard. With that being said she is continuing to still have some discomfort but I do not feel like it is as bad as previous. I did review her culture today which revealed Staph epidermidis but I do not see really any need indicate any further antibiotics at this point it did show resistant to tetracycline but at the same time her wound appears to be doing better.  I do not believe this is indicative of any type  of infection at this point. My suggestion would be that she continue with her antibiotic for the time being to completion. 02/21/2019 on evaluation today patient appears to be doing well with regard to her leg ulcer. She is showing some signs of good granulation. With that being said unfortunately this is still somewhat slow to heal she still does have some depth. I do believe that potentially a snap VAC could be of benefit for her this was discussed with the patient and her son today as well it was a question they initially brought up about a wound VAC. I believe that it may actually be an excellent option for her and in fact would probably speed up the recovery quite a bit. She is actually very pleased to hear this and would like to proceed if at all possible with that today. 02/28/2019 upon evaluation today patient appears to be showing signs of excellent improvement based on what I am seeing currently. Fortunately there is no signs of active infection and in general I feel like she has been doing extremely well with regard to the snap VAC. In just 1 weeks time again this is excellent and dramatic improvement in my opinion. Overall I am very pleased. I definitely think we can continue as such. Electronic Signature(s) Signed: 02/28/2019 6:22:08 PM By: Worthy Keeler PA-C Entered By: Worthy Keeler on 02/28/2019 18:22:08 -------------------------------------------------------------------------------- Physical Exam Details Patient Name: Date of Service: Sally, Duran 02/28/2019 3:15 PM Medical Record [id number] Patient Account Number: [id number] Date of Birth/Sex: Treating RN: 1945-10-16 (74 y.o. F) Primary Care Provider: BRAKE, ANDREW Other Clinician: Referring Provider: Treating Provider/Extender:Stone III, Vianey Caniglia BRAKE, [name] in Treatment: 7 Constitutional Well-nourished and well-hydrated in no acute distress. Respiratory normal breathing without  difficulty. Psychiatric this patient is able to make decisions and demonstrates good insight into disease process. Alert and Oriented x 3. pleasant and cooperative. Notes Patient's wound currently did not require any sharp debridement everything appears to be doing quite well and I am extremely happy with that. I do think that we will continue with the snap VAC there is no reason to switch away since she is doing so great with the device at this point Electronic Signature(s) Signed: 02/28/2019 6:22:26 PM By: Worthy Keeler PA-C Entered By: Worthy Keeler on 02/28/2019 18:22:26 -------------------------------------------------------------------------------- Physician Orders Details Patient Name: Date of Service: [name] 02/28/2019 3:15 PM Medical Record [id number] Patient Account Number: [id number] Date of Birth/Sex: Treating RN: 07-20-1945 (74 y.o. Elam Dutch Primary Care Provider: Jillyn Ledger Other Clinician: Referring Provider: Treating Provider/Extender:Stone III, Lyndee Hensen, [name] in Treatment: 7 Verbal / Phone Orders: No Diagnosis Coding ICD-10 Coding Code Description [id number] Non-pressure chronic ulcer of right calf with other specified severity S80.11XD Contusion of right lower leg, subsequent encounter Follow-up Appointments Return Appointment in 1 week. Dressing Change Frequency Do not change entire dressing for one week. Skin Barriers/Duran-Wound Care Skin Prep Wound Cleansing May shower with protection. - can use cast protector Negative Presssure Wound Therapy SNAP Vac to wound continuously at 124mm/hg pressure - tuck into undermined area at 1 o'clock Edema Control 3 Layer Compression System - Right Lower Extremity Avoid standing for long periods of time Elevate legs to the level of the heart or above for 30 minutes daily and/or when sitting, a frequency of: Exercise regularly Electronic Signature(s) Signed: 02/28/2019 6:36:45 PM  By: Baruch Gouty RN, BSN Signed: 02/28/2019 7:52:58 PM  By: Worthy Keeler PA-C Entered By: Baruch Gouty on 02/28/2019 17:24:36 -------------------------------------------------------------------------------- Problem List Details Patient Name: Date of Service: Sally, Duran 02/28/2019 3:15 PM Medical Record [id number] Patient Account Number: [id number] Date of Birth/Sex: Treating RN: [birth year] (74 y.o. F) Primary Care Provider: BRAKE, ANDREW Other Clinician: Referring Provider: Treating Provider/Extender:Stone III, Lyndee Hensen, [name] in Treatment: 7 Active Problems ICD-10 Evaluated Encounter Code Description Active Date Today Diagnosis L97.218 Non-pressure chronic ulcer of right calf with other 01/05/2019 No Yes specified severity S80.11XD Contusion of right lower leg, subsequent encounter 01/05/2019 No Yes Inactive Problems Resolved Problems Electronic Signature(s) Signed: 02/28/2019 3:55:22 PM By: Worthy Keeler PA-C Entered By: Worthy Keeler on 02/28/2019 15:55:21 -------------------------------------------------------------------------------- Progress Note Details Patient Name: Date of Service: [name] 02/28/2019 3:15 PM Medical Record [id number] Patient Account Number: [id number] Date of Birth/Sex: Treating RN: [birth year] (74 y.o. F) Primary Care Provider: Jillyn Ledger Other Clinician: Referring Provider: Treating Provider/Extender:Stone III, Lyndee Hensen, [name] in Treatment: 7 Subjective Chief Complaint Information obtained from Patient 01/05/2019; patient arrives in clinic for review of contusion on the right lateral calf area History of Present Illness (HPI) ADMISSION 01/05/2019 This is a 74 year old woman with advanced COPD on chronic O2. She managed to strike her lower leg while getting out of a car on Thanksgiving day. She had a wound. The vent they tried to look after themselves at home eventually saw her primary  doctor a week later. She was given Keflex and Neosporin. An x-ray was apparently done but I do not have these results. She was seen in the ER last night and given doxycycline this appointment was arranged. The patient has a 3.5 x 2 surface area of black eschar and wound. Swelling inferiorly suggestive of a hematoma. I see no current evidence of infection Past medical history type 2 diabetes recent hemoglobin A1c of 7.6 in April. Chronic COPD the on oxygen. 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 ABI in this clinic was 1.3 on the right 12/18; the necrotic surface of this hematoma area remained intact which is somewhat surprising. The swelling around the area has gotten better. She is still having some discomfort managed with as needed tramadol 12/30-Patient returns with dense looking eschar on top of the hematoma on the right lateral leg calf area. Patient is on O2 around-the-clock 02/07/2019 upon evaluation today patient presents for reevaluation in the clinic concerning issues with her right lower extremity ulcer around the calf. She had a hematoma here that was debrided away. She does have a tunnel at 2:00 and overall this seems to be doing okay though there is some sign of infection at this point there is some erythema and warmth to touch around the wound that has me somewhat concerned. Fortunately there is no evidence of active infection systemically which is good news. The patient states in general that she is having some pain although this is not terrible she is okay with me attempting sharp debridement to clear away as much of the necrotic tissue as I can. 02/22/2019 upon evaluation today patient appears to be doing better with regard to her ulcer at this point. She has been tolerating the dressing changes without complication. I do feel like she is making good  progress and I am very pleased in this regard. With that being said she is continuing to still have some discomfort but I do not feel like it is as  bad as previous. I did review her culture today which revealed Staph epidermidis but I do not see really any need indicate any further antibiotics at this point it did show resistant to tetracycline but at the same time her wound appears to be doing better. I do not believe this is indicative of any type of infection at this point. My suggestion would be that she continue with her antibiotic for the time being to completion. 02/21/2019 on evaluation today patient appears to be doing well with regard to her leg ulcer. She is showing some signs of good granulation. With that being said unfortunately this is still somewhat slow to heal she still does have some depth. I do believe that potentially a snap VAC could be of benefit for her this was discussed with the patient and her son today as well it was a question they initially brought up about a wound VAC. I believe that it may actually be an excellent option for her and in fact would probably speed up the recovery quite a bit. She is actually very pleased to hear this and would like to proceed if at all possible with that today. 02/28/2019 upon evaluation today patient appears to be showing signs of excellent improvement based on what I am seeing currently. Fortunately there is no signs of active infection and in general I feel like she has been doing extremely well with regard to the snap VAC. In just 1 weeks time again this is excellent and dramatic improvement in my opinion. Overall I am very pleased. I definitely think we can continue as such. Objective Constitutional Well-nourished and well-hydrated in no acute distress. Vitals Time Taken: 4:22 PM, Height: 62 in, Temperature: 98.4 F, Pulse: 67 bpm, Respiratory Rate: 20 breaths/min, Blood Pressure: 93/57 mmHg, Capillary Blood Glucose: 164  mg/dl. General Notes: glucose per pt report Respiratory normal breathing without difficulty. Psychiatric this patient is able to make decisions and demonstrates good insight into disease process. Alert and Oriented x 3. pleasant and cooperative. General Notes: Patient's wound currently did not require any sharp debridement everything appears to be doing quite well and I am extremely happy with that. I do think that we will continue with the snap VAC there is no reason to switch away since she is doing so great with the device at this point Integumentary (Hair, Skin) Wound #1 status is Open. Original cause of wound was Trauma. The wound is located on the Right,Lateral Lower Leg. The wound measures 3.7cm length x 1.5cm width x 0.8cm depth; 4.359cm^2 area and 3.487cm^3 volume. There is Fat Layer (Subcutaneous Tissue) Exposed exposed. Tunneling has been noted at 1:00 with a maximum distance of 0.5cm. Undermining begins at 3:00 and ends at 5:00 with a maximum distance of 3.4cm. There is a medium amount of serosanguineous drainage noted. The wound margin is flat and intact. There is large (67-100%) red granulation within the wound bed. There is a small (1-33%) amount of necrotic tissue within the wound bed including Adherent Slough. 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 There was a Three Layer Compression Therapy Procedure by Baruch Gouty, RN. Post procedure Diagnosis Wound #1: Same as Pre-Procedure Plan Follow-up Appointments: Return Appointment in 1 week. Dressing Change Frequency: Do not change entire dressing for one week. Skin Barriers/Duran-Wound Care: Skin  Prep Wound Cleansing: May shower with protection. - can use cast protector Negative  Presssure Wound Therapy: SNAP Vac to wound continuously at 179mm/hg pressure - tuck into undermined area at 1 o'clock Edema Control: 3 Layer Compression System - Right Lower Extremity Avoid standing for long periods of time Elevate legs to the level of the heart or above for 30 minutes daily and/or when sitting, a frequency of: Exercise regularly 1. We will continue to utilize the snap VAC at this point. I think that she has experienced significant improvement and in just 1 week's time I definitely think this warrants a continuation. 2. I am also going to suggest that we continue with 3 layer compression wrap that is keeping her edema under control which is also doing very well for her. We will see patient back for reevaluation in 1 week here in the clinic. If anything worsens or changes patient will contact our office for additional recommendations. Electronic Signature(s) Signed: 02/28/2019 6:23:01 PM By: Worthy Keeler PA-C Entered By: Worthy Keeler on 02/28/2019 18:23:01 -------------------------------------------------------------------------------- SuperBill Details Patient Name: Date of Service: [name] 02/28/2019 Medical Record [id number] Patient Account Number: [id number] Date of Birth/Sex: Treating RN: [birth year] (74 y.o. Elam Dutch Primary Care Provider: Jillyn Ledger Other Clinician: Referring Provider: Treating Provider/Extender:Stone III, Lyndee Hensen, [name] in Treatment: 7 Diagnosis Coding ICD-10 Codes Code Description [phone] Non-pressure chronic ulcer of right calf with other specified severity S80.11XD Contusion of right lower leg, subsequent encounter Facility Procedures CPT4 Code: [id number] Description: [id number] NEG PRESS WND TX <=50 SQ CM Modifier: Quantity: 1 Physician Procedures CPT4 Code Description: [id number] - WC PHYS LEVEL 4 - EST PT ICD-10 Diagnosis Description [id number] Non-pressure chronic ulcer of right calf with other  specifie S80.11XD Contusion of right lower leg, subsequent encounter Modifier: 1 d severity Quantity: Electronic Signature(s) Signed: 02/28/2019 6:24:40 PM By: Worthy Keeler PA-C Entered By: Worthy Keeler on 02/28/2019 18:24:40

## 2019-03-06 NOTE — Progress Notes (Signed)
[name], [name] ([id number]) Visit Report for 02/28/2019 Arrival Information Details Patient Name: Date of Service: [name], [name] 02/28/2019 3:15 PM Medical Record [id number] Patient Account Number: [id number] Date of Birth/Sex: Treating RN: Sep 26, 1945 (74 y.o. Nancy Fetter Primary Care Shaman Muscarella: BRAKE, ANDREW Other Clinician: Referring Mandalyn Pasqua: Treating Kamila Broda/Extender:Stone III, Lyndee Hensen, [name] in Treatment: 7 Visit Information History Since Last Visit Added or deleted any medications: No Patient Arrived: Wheel Chair Any new allergies or adverse reactions: No Arrival Time: 16:21 Had a fall or experienced change in No activities of daily living that may affect Accompanied By: son risk of falls: Transfer Assistance: None Signs or symptoms of abuse/neglect since last No Patient Identification Verified: Yes visito Secondary Verification Process Yes Hospitalized since last visit: No Completed: Implantable device outside of the clinic excluding No Patient Requires Transmission-Based No cellular tissue based products placed in the center Precautions: since last visit: Patient Has Alerts: No Has Dressing in Place as Prescribed: Yes Has Compression in Place as Prescribed: Yes Pain Present Now: No Electronic Signature(s) Signed: 03/06/2019 5:30:42 PM By: Levan Hurst RN, BSN Entered By: Levan Hurst on 02/28/2019 16:21:43 -------------------------------------------------------------------------------- Compression Therapy Details Patient Name: Date of Service: [name] 02/28/2019 3:15 PM Medical Record [id number] Patient Account Number: [id number] Date of Birth/Sex: Treating RN: [birth year] (74 y.o. Elam Dutch Primary Care Arbie Reisz: Jillyn Ledger Other Clinician: Referring Bronislaus Verdell: Treating Taiquan Campanaro/Extender:Stone III, Lyndee Hensen, [name] in Treatment: 7 Compression Therapy Performed for Wound Wound #1 Right,Lateral  Lower Leg Assessment: Performed By: Clinician Baruch Gouty, RN Compression Type: Three Layer Post Procedure Diagnosis Same as Pre-procedure Electronic Signature(s) Signed: 02/28/2019 6:36:45 PM By: Baruch Gouty RN, BSN Entered By: Baruch Gouty on 02/28/2019 17:24:06 -------------------------------------------------------------------------------- Encounter Discharge Information Details Patient Name: Date of Service: [name] 02/28/2019 3:15 PM Medical Record [id number] Patient Account Number: [id number] Date of Birth/Sex: Treating RN: 1945-09-26 (74 y.o. Elam Dutch Primary Care Alfonso Shackett: Jillyn Ledger Other Clinician: Referring Sabatino Williard: Treating Kiron Osmun/Extender:Stone III, Lyndee Hensen, [name] in Treatment: 7 Encounter Discharge Information Items Discharge Condition: Stable Ambulatory Status: Wheelchair Discharge Destination: Home Transportation: Private Auto Accompanied By: son Schedule Follow-up Appointment: Yes Clinical Summary of Care: Patient Declined Electronic Signature(s) Signed: 02/28/2019 6:36:45 PM By: Baruch Gouty RN, BSN Entered By: Baruch Gouty on 02/28/2019 17:41:32 -------------------------------------------------------------------------------- Lower Extremity Assessment Details Patient Name: Date of Service: [name] 02/28/2019 3:15 PM Medical Record [id number] Patient Account Number: [id number] Date of Birth/Sex: Treating RN: 1945/04/01 (74 y.o. Nancy Fetter Primary Care Brittani Purdum: BRAKE, ANDREW Other Clinician: Referring Marcus Schwandt: Treating Jennifier Smitherman/Extender:Stone III, Lyndee Hensen, [name] in Treatment: 7 Edema Assessment Assessed: [Left: No] [Right: No] Edema: [Left: Ye] [Right: s] Calf Left: Right: Point of Measurement: 28 cm From Medial Instep cm 36.5 cm Ankle Left: Right: Point of Measurement: 10 cm From Medial Instep cm 21 cm Vascular Assessment Pulses: Dorsalis Pedis Palpable:  [Right:Yes] Electronic Signature(s) Signed: 03/06/2019 5:30:42 PM By: Levan Hurst RN, BSN Entered By: Levan Hurst on 02/28/2019 16:22:49 -------------------------------------------------------------------------------- Multi-Disciplinary Care Plan Details Patient Name: Date of Service: [name] 02/28/2019 3:15 PM Medical Record [id number] Patient Account Number: [id number] Date of Birth/Sex: Treating RN: Oct 06, 1945 (74 y.o. Elam Dutch Primary Care Dai Mcadams: Jillyn Ledger Other Clinician: Referring Telma Pyeatt: Treating Izzah Pasqua/Extender:Stone III, Lyndee Hensen, [name] in Treatment: 7 Active Inactive Venous Leg Ulcer Nursing Diagnoses: Potential for venous Insuffiency (use before diagnosis confirmed) Goals: Patient will maintain optimal edema control Date Initiated: 02/07/2019 Target Resolution Date: 03/07/2019 Goal Status: Active Interventions: Assess  peripheral edema status every visit. Compression as ordered Provide education on venous insufficiency Treatment Activities: Therapeutic compression applied : 02/07/2019 Notes: Wound/Skin Impairment Nursing Diagnoses: Impaired tissue integrity Goals: Patient/caregiver will verbalize understanding of skin care regimen Date Initiated: 02/07/2019 Target Resolution Date: 03/07/2019 Goal Status: Active Ulcer/skin breakdown will have a volume reduction of 30% by week 4 Date Initiated: 01/05/2019 Date Inactivated: 02/07/2019 Target Resolution Date: 02/02/2019 Unmet Reason: hematoma Goal Status: Unmet unroofed Ulcer/skin breakdown will have a volume reduction of 50% by week 8 Date Initiated: 02/07/2019 Target Resolution Date: 03/07/2019 Goal Status: Active Interventions: Provide education on smoking Provide education on ulcer and skin care Notes: Electronic Signature(s) Signed: 02/28/2019 6:36:45 PM By: Baruch Gouty RN, BSN Entered By: Baruch Gouty on 02/28/2019  17:21:58 -------------------------------------------------------------------------------- Negative Pressure Wound Therapy Maintenance (NPWT) Details Patient Name: Date of Service: [name], [name] 02/28/2019 3:15 PM Medical Record [id number] Patient Account Number: [id number] Date of Birth/Sex: Treating RN: 1945/12/13 (74 y.o. Elam Dutch Primary Care Kamori Kitchens: Jillyn Ledger Other Clinician: Referring Braddock Servellon: Treating Otis Burress/Extender:Stone III, Lyndee Hensen, [name] in Treatment: 7 NPWT Maintenance Performed for: Wound #1 Right, Lateral Lower Leg Performed By: Baruch Gouty, RN Type: Other Coverage Size (sq cm): 5.55 Pressure Type: Constant Pressure Setting: 125 mmHG Drain Type: None Sponge/Dressing Type: Foam, Green Date Initiated: 02/21/2019 Dressing Removed: No Quantity of Sponges/Gauze Removed: 1 Canister Changed: No Canister Exudate Volume: 20 Dressing Reapplied: No Quantity of Sponges/Gauze Inserted: 1 Respones To Treatment: good Days On NPWT: 8 Post Procedure Diagnosis Same as Pre-procedure Electronic Signature(s) Signed: 02/28/2019 6:36:45 PM By: Baruch Gouty RN, BSN Entered By: Baruch Gouty on 02/28/2019 17:23:48 -------------------------------------------------------------------------------- Pain Assessment Details Patient Name: Date of Service: [name] 02/28/2019 3:15 PM Medical Record [id number] Patient Account Number: [id number] Date of Birth/Sex: Treating RN: 02/06/1945 (74 y.o. Nancy Fetter Primary Care Justus Droke: Jillyn Ledger Other Clinician: Referring Derrick Orris: Treating Azaliah Carrero/Extender:Stone III, Lyndee Hensen, [name] in Treatment: 7 Active Problems Location of Pain Severity and Description of Pain Patient Has Paino No Site Locations Pain Management and Medication Current Pain Management: Electronic Signature(s) Signed: 03/06/2019 5:30:42 PM By: Levan Hurst RN, BSN Entered By: Levan Hurst on  02/28/2019 16:22:18 -------------------------------------------------------------------------------- Patient/Caregiver Education Details Patient Name: [name]. Date of Service: 2/3/2021andnbsp3:15 PM Medical Record [phone] Patient Account Number: [id number] Date of Birth/Gender: [birth year] (74 y.o. F) Treating RN: Baruch Gouty Primary Care Physician: Jillyn Ledger Other Clinician: Referring Physician: Treating Physician/Extender:Stone III, Lyndee Hensen, [name] in Treatment: 7 Education Assessment Education Provided To: Patient Education Topics Provided Venous: Methods: Explain/Verbal Responses: Reinforcements needed, State content correctly Wound/Skin Impairment: Methods: Explain/Verbal Responses: Reinforcements needed, State content correctly Electronic Signature(s) Signed: 02/28/2019 6:36:45 PM By: Baruch Gouty RN, BSN Entered By: Baruch Gouty on 02/28/2019 17:22:18 -------------------------------------------------------------------------------- Wound Assessment Details Patient Name: Date of Service: [name] 02/28/2019 3:15 PM Medical Record [id number] Patient Account Number: [id number] Date of Birth/Sex: Treating RN: 04-Aug-1945 (74 y.o. F) Primary Care Elfa Wooton: BRAKE, ANDREW Other Clinician: Referring Marquet Faircloth: Treating Addis Bennie/Extender:Stone III, Lyndee Hensen, [name] in Treatment: 7 Wound Status Wound Number: 1 Primary Venous Leg Ulcer Etiology: Wound Location: Right Lower Leg - Lateral Wound Open Wounding Event: Trauma Status: Date Acquired: 12/21/2018 Comorbid Glaucoma, Chronic Obstructive Pulmonary Weeks Of Treatment: 7 History: Disease (COPD), Hypertension, Type II Clustered Wound: No Diabetes, Osteoarthritis, Osteomyelitis, Neuropathy, Confinement Anxiety Photos Wound Measurements Length: (cm) 3.7 Width: (cm) 1.5 Depth: (cm) 0.8 Area: (cm) 4.359 Volume: (cm) 3.487 % Reduction in Area: 20.7% %  Reduction in Volume: -534%  Epithelialization: Small (1-33%) Tunneling: Yes Position (o'clock): 1 Maximum Distance: (cm) 0.5 Undermining: Yes Starting Position (o'clock): 3 Ending Position (o'clock): 5 Maximum Distance: (cm) 3.4 Wound Description Full Thickness Without Exposed Support Foul O Classification: Structures Slough Wound Flat and Intact Margin: Exudate Medium Amount: Exudate Serosanguineous Type: Exudate red, brown Color: Wound Bed Granulation Amount: Large (67-100%) Granulation Quality: Red Fascia Necrotic Amount: Small (1-33%) Fat Lay Necrotic Quality: Adherent Slough Tendon Muscle Joint E Bone Ex dor After Cleansing: No /Fibrino Yes Exposed Structure Exposed: No er (Subcutaneous Tissue) Exposed: Yes Exposed: No Exposed: No xposed: No posed: No Treatment Notes Wound #1 (Right, Lateral Lower Leg) 2. Periwound Care Moisturizing lotion 6. Support Layer Applied 3 layer compression wrap Notes snap vac, 136mmhg used 2 pieces of blue foam , netting. Electronic Signature(s) Signed: 03/06/2019 4:28:10 PM By: Mikeal Hawthorne EMT/HBOT Entered By: Mikeal Hawthorne on 03/06/2019 15:38:38 -------------------------------------------------------------------------------- Vitals Details Patient Name: Date of Service: [name] 02/28/2019 3:15 PM Medical Record [id number] Patient Account Number: [id number] Date of Birth/Sex: Treating RN: Jun 23, 1945 (74 y.o. Nancy Fetter Primary Care Jahniyah Revere: BRAKE, ANDREW Other Clinician: Referring Lukisha Procida: Treating Raiven Belizaire/Extender:Stone III, Lyndee Hensen, [name] in Treatment: 7 Vital Signs Time Taken: 16:22 Temperature (F): 98.4 Height (in): 62 Pulse (bpm): 67 Respiratory Rate (breaths/min): 20 Blood Pressure (mmHg): 93/57 Capillary Blood Glucose (mg/dl): 164 Reference Range: 80 - 120 mg / dl Notes glucose per pt report Electronic Signature(s) Signed: 03/06/2019 5:30:42 PM By: Levan Hurst RN,  BSN Entered By: Levan Hurst on 02/28/2019 16:22:13

## 2019-03-07 ENCOUNTER — Other Ambulatory Visit: Payer: Self-pay

## 2019-03-07 ENCOUNTER — Encounter (HOSPITAL_BASED_OUTPATIENT_CLINIC_OR_DEPARTMENT_OTHER): Payer: Medicare Other | Attending: Internal Medicine | Admitting: Physician Assistant

## 2019-03-07 DIAGNOSIS — L97212 Non-pressure chronic ulcer of right calf with fat layer exposed: Secondary | ICD-10-CM | POA: Diagnosis not present

## 2019-03-07 DIAGNOSIS — I872 Venous insufficiency (chronic) (peripheral): Secondary | ICD-10-CM | POA: Diagnosis not present

## 2019-03-07 NOTE — Progress Notes (Addendum)
Duran, Sally ([id number]) Visit Report for 03/07/2019 Arrival Information Details Patient Name: Date of Service: [name], BUECHLER 03/07/2019 3:00 PM Medical Record [id number] Patient Account Number: [id number] Date of Birth/Sex: Treating RN: 1945-12-28 (74 y.o. [name], Meta.Reding Primary Care Tinsley Lomas: BRAKE, ANDREW Other Clinician: Referring Aniyia Rane: Treating Kathrene Sinopoli/Extender:Stone III, Lyndee Hensen, [name] in Treatment: 8 Visit Information History Since Last Visit Added or deleted any medications: No Patient Arrived: Wheel Chair Any new allergies or adverse reactions: No Arrival Time: 15:38 Had a fall or experienced change in No activities of daily living that may affect Accompanied By: son risk of falls: Transfer Assistance: None Signs or symptoms of abuse/neglect since last No Patient Identification Verified: Yes visito Secondary Verification Process Yes Hospitalized since last visit: No Completed: Implantable device outside of the clinic excluding No Patient Requires Transmission-Based No cellular tissue based products placed in the center Precautions: since last visit: Patient Has Alerts: No Has Dressing in Place as Prescribed: Yes Has Compression in Place as Prescribed: Yes Pain Present Now: No Electronic Signature(s) Signed: 03/07/2019 5:06:10 PM By: Deon Pilling Entered By: Deon Pilling on 03/07/2019 15:42:53 -------------------------------------------------------------------------------- Compression Therapy Details Patient Name: Date of Service: Duran, Sally 03/07/2019 3:00 PM Medical Record [id number] Patient Account Number: [id number] Date of Birth/Sex: Treating RN: [birth year] (74 y.o. [name] Primary Care Lyncoln Ledgerwood: Jillyn Ledger Other Clinician: Referring Azaliah Carrero: Treating Laronda Lisby/Extender:Stone III, Lyndee Hensen, [name] in Treatment: 8 Compression Therapy Performed for Wound Wound #1 Right,Lateral Lower  Leg Assessment: Performed By: Clinician Carlene Coria, RN Compression Type: Three Layer Post Procedure Diagnosis Same as Pre-procedure Electronic Signature(s) Signed: 03/07/2019 5:39:41 PM By: Baruch Gouty RN, BSN Entered By: Baruch Gouty on 03/07/2019 16:03:20 -------------------------------------------------------------------------------- Encounter Discharge Information Details Patient Name: Date of Service: [name] 03/07/2019 3:00 PM Medical Record [id number] Patient Account Number: [id number] Date of Birth/Sex: Treating RN: 1945/08/26 (74 y.o. [name] Primary Care Denim Kalmbach: Jillyn Ledger Other Clinician: Referring Yoshie Kosel: Treating Watson Robarge/Extender:Stone III, Lyndee Hensen, [name] in Treatment: 8 Encounter Discharge Information Items Discharge Condition: Stable Ambulatory Status: Wheelchair Discharge Destination: Home Transportation: Private Auto Accompanied By: son Schedule Follow-up Appointment: Yes Clinical Summary of Care: Patient Declined Electronic Signature(s) Signed: 03/07/2019 4:53:37 PM By: Carlene Coria RN Entered By: Carlene Coria on 03/07/2019 16:36:09 -------------------------------------------------------------------------------- Lower Extremity Assessment Details Patient Name: Date of Service: [name], [name] 03/07/2019 3:00 PM Medical Record [id number] Patient Account Number: [id number] Date of Birth/Sex: Treating RN: Apr 26, 1945 (74 y.o. [name] Primary Care Andora Krull: BRAKE, ANDREW Other Clinician: Referring Lennix Rotundo: Treating Gita Dilger/Extender:Stone III, Lyndee Hensen, [name] in Treatment: 8 Edema Assessment Assessed: [Left: No] [Right: Yes] Edema: [Left: Ye] [Right: s] Calf Left: Right: Point of Measurement: 28 cm From Medial Instep cm 39 cm Ankle Left: Right: Point of Measurement: 10 cm From Medial Instep cm 21 cm Vascular Assessment Pulses: Dorsalis Pedis Palpable: [Right:Yes] Electronic  Signature(s) Signed: 03/07/2019 5:06:10 PM By: Deon Pilling Entered By: Deon Pilling on 03/07/2019 15:48:34 -------------------------------------------------------------------------------- Multi-Disciplinary Care Plan Details Patient Name: Date of Service: [name] 03/07/2019 3:00 PM Medical Record [id number] Patient Account Number: [id number] Date of Birth/Sex: Treating RN: 05/30/1945 (74 y.o. [name] Primary Care Starsha Morning: Jillyn Ledger Other Clinician: Referring Reghan Thul: Treating Meiah Zamudio/Extender:Stone III, Lyndee Hensen, [name] in Treatment: 8 Active Inactive Venous Leg Ulcer Nursing Diagnoses: Potential for venous Insuffiency (use before diagnosis confirmed) Goals: Patient will maintain optimal edema control Date Initiated: 02/07/2019 Target Resolution Date: 04/04/2019 Goal Status: Active Interventions: Assess peripheral edema status every visit.  Compression as ordered Provide education on venous insufficiency Treatment Activities: Therapeutic compression applied : 02/07/2019 Notes: Wound/Skin Impairment Nursing Diagnoses: Impaired tissue integrity Goals: Patient/caregiver will verbalize understanding of skin care regimen Date Initiated: 02/07/2019 Target Resolution Date: 04/04/2019 Goal Status: Active Ulcer/skin breakdown will have a volume reduction of 30% by week 4 Date Initiated: 01/05/2019 Date Inactivated: 02/07/2019 Target Resolution Date: 02/02/2019 Unmet Reason: hematoma Goal Status: Unmet unroofed Ulcer/skin breakdown will have a volume reduction of 50% by week 8 Date Initiated: 02/07/2019 Date Inactivated: 03/07/2019 Target Resolution Date: 03/07/2019 Goal Status: Met Ulcer/skin breakdown will have a volume reduction of 80% by week 12 Date Initiated: 03/07/2019 Target Resolution Date: 04/04/2019 Goal Status: Active Interventions: Provide education on smoking Provide education on ulcer and skin care Notes: Electronic  Signature(s) Signed: 03/07/2019 5:39:41 PM By: Baruch Gouty RN, BSN Entered By: Baruch Gouty on 03/07/2019 16:02:37 -------------------------------------------------------------------------------- Negative Pressure Wound Therapy Maintenance (NPWT) Details Patient Name: Date of Service: MIHO, [name] 03/07/2019 3:00 PM Medical Record [id number] Patient Account Number: [id number] Date of Birth/Sex: Treating RN: November 30, 1945 (74 y.o. [name] Primary Care Dacia Capers: Jillyn Ledger Other Clinician: Referring Duvid Smalls: Treating Katiya Fike/Extender:Stone III, Lyndee Hensen, [name] in Treatment: 8 NPWT Maintenance Performed for: Wound #1 Right, Lateral Lower Leg Performed By: Baruch Gouty, RN Type: Other Coverage Size (sq cm): 4.95 Pressure Type: Constant Pressure Setting: 125 mmHG Drain Type: None Sponge/Dressing Type: Foam, Green Date Initiated: 02/21/2019 Dressing Removed: No Quantity of Sponges/Gauze Removed: 1 Canister Changed: No Canister Exudate Volume: 30 Dressing Reapplied: No Quantity of Sponges/Gauze Inserted: 1 Respones To Treatment: good Days On NPWT: 15 Post Procedure Diagnosis Same as Pre-procedure Notes SNAP VAC Electronic Signature(s) Signed: 03/07/2019 5:39:41 PM By: Baruch Gouty RN, BSN Entered By: Baruch Gouty on 03/07/2019 16:04:05 -------------------------------------------------------------------------------- Pain Assessment Details Patient Name: Date of Service: [name] 03/07/2019 3:00 PM Medical Record [id number] Patient Account Number: [id number] Date of Birth/Sex: Treating RN: 09/03/1945 (74 y.o. [name] Primary Care Dove Gresham: Jillyn Ledger Other Clinician: Referring Dre Gamino: Treating Donne Robillard/Extender:Stone III, Lyndee Hensen, [name] in Treatment: 8 Active Problems Location of Pain Severity and Description of Pain Patient Has Paino No Site Locations Rate the pain. Current Pain Level:  0 Pain Management and Medication Current Pain Management: Medication: No Cold Application: No Rest: No Massage: No Activity: No T.E.N.S.: No Heat Application: No Leg drop or elevation: No Is the Current Pain Management Adequate: Adequate How does your wound impact your activities of daily livingo Sleep: No Bathing: No Appetite: No Relationship With Others: No Bladder Continence: No Emotions: No Bowel Continence: No Work: No Toileting: No Drive: No Dressing: No Hobbies: No Electronic Signature(s) Signed: 03/07/2019 5:06:10 PM By: Deon Pilling Entered By: Deon Pilling on 03/07/2019 15:43:04 -------------------------------------------------------------------------------- Patient/Caregiver Education Details Patient Name: Date of Service: Dols, Quinita A. 2/10/2021andnbsp3:00 PM Medical Record [id number] Patient Account Number: [id number] Date of Birth/Gender: Treating RN: [birth year] (74 y.o. [name] Primary Care Physician: Jillyn Ledger Other Clinician: Referring Physician: Treating Physician/Extender:Stone III, Lyndee Hensen, [name] in Treatment: 8 Education Assessment Education Provided To: Patient Education Topics Provided Venous: Methods: Explain/Verbal Responses: Reinforcements needed, State content correctly Wound/Skin Impairment: Methods: Explain/Verbal Responses: Reinforcements needed, State content correctly Electronic Signature(s) Signed: 03/07/2019 5:39:41 PM By: Baruch Gouty RN, BSN Entered By: Baruch Gouty on 03/07/2019 16:02:57 -------------------------------------------------------------------------------- Wound Assessment Details Patient Name: Date of Service: [name] 03/07/2019 3:00 PM Medical Record [id number] Patient Account Number: [id number] Date of Birth/Sex: Treating RN: [birth year] (74 y.o. Martyn Malay, [facility]  Olla Delancey: Tree surgeon Other Clinician: Referring Yannely Kintzel: Treating  Kortland Nichols/Extender:Stone III, Lyndee Hensen, [name] in Treatment: 8 Wound Status Wound Number: 1 Primary Venous Leg Ulcer Etiology: Wound Location: Right Lower Leg - Lateral Wound Open Wounding Event: Trauma Status: Date Acquired: 12/21/2018 Comorbid Glaucoma, Chronic Obstructive Pulmonary Weeks Of Treatment: 8 History: Disease (COPD), Hypertension, Type II Clustered Wound: No Diabetes, Osteoarthritis, Osteomyelitis, Neuropathy, Confinement Anxiety Photos Wound Measurements Length: (cm) 3.3 % Reduct Width: (cm) 1.5 % Reduct Depth: (cm) 0.3 Epitheli Area: (cm) 3.888 Tunneli Volume: (cm) 1.166 Undermi Wound Description Full Thickness Without Exposed Support Foul Od Classification: Structures Slough/ Wound Flat and Intact Margin: Exudate Medium Amount: Exudate Serosanguineous Type: Exudate red, brown Color: Wound Bed Granulation Amount: Large (67-100%) Granulation Quality: Red Fascia E Necrotic Amount: None Present (0%) Fat Laye Tendon E Muscle E Joint Ex Bone Exp or After Cleansing: No Fibrino No Exposed Structure xposed: No r (Subcutaneous Tissue) Exposed: Yes xposed: No xposed: No posed: No osed: No ion in Area: 29.3% ion in Volume: -112% alization: Medium (34-66%) ng: No ning: No Treatment Notes Wound #1 (Right, Lateral Lower Leg) 1. Cleanse With Wound Cleanser Soap and water 2. Periwound Care Moisturizing lotion Skin Prep 3. Primary Dressing Applied Other primary dressing (specifiy in notes) 6. Support Layer Applied 3 layer compression wrap Notes snap vac, 136mhg used 1 pieces of blue foam , netting. Electronic Signature(s) Signed: 03/08/2019 4:30:47 PM By: JMikeal HawthorneEMT/HBOT Signed: 03/09/2019 5:30:09 PM By: BBaruch GoutyRN, BSN Previous Signature: 03/07/2019 5:06:10 PM Version By: DDeon PillingEntered By: JMikeal Hawthorneon 03/08/2019  13:25:59 -------------------------------------------------------------------------------- Vitals Details Patient Name: Date of Service: AArther Abbott2/10/2019 3:00 PM Medical Record [id number] Account Number: [id number] of Birth/Sex: Treating RN: [birth year](74y.o. FDebby BudPrimary Care Donnavan Covault: BRAKE, ANDREW Other Clinician: Referring Spence Soberano: Treating Anik Wesch/Extender:Stone III, HLyndee Hensen [name] in Treatment: 8 Vital Signs Time Taken: 15:40 Temperature (F): 98.2 Height (in): 62 Pulse (bpm): 72 Respiratory Rate (breaths/min): 20 Blood Pressure (mmHg): 93/63 Capillary Blood Glucose (mg/dl): 174 Reference Range: 80 - 120 mg / dl Notes patient denies weakness, or dizziness. PA made aware of BP. Electronic Signature(s) Signed: 03/07/2019 5:06:10 PM By: DDeon PillingEntered By: DDeon Pillingon 03/07/2019 15:47:55

## 2019-03-07 NOTE — Progress Notes (Addendum)
[name], [name] ([id number]) Visit Report for 03/07/2019 Chief Complaint Document Details Patient Name: Date of Service: [name], [name] 03/07/2019 3:00 PM Medical Record [id number] Patient Account Number: [id number] Date of Birth/Sex: Treating RN: February 02, 1945 (74 y.o. Elam Dutch Primary Care Provider: Jillyn Ledger Other Clinician: Referring Provider: Treating Provider/Extender:Stone III, Lyndee Hensen, [name] in Treatment: 8 Information Obtained from: Patient Chief Complaint 01/05/2019; patient arrives in clinic for review of contusion on the right lateral calf area Electronic Signature(s) Signed: 03/07/2019 3:36:59 PM By: Worthy Keeler PA-C Entered By: Worthy Keeler on 03/07/2019 15:36:59 -------------------------------------------------------------------------------- HPI Details Patient Name: Date of Service: [name] 03/07/2019 3:00 PM Medical Record [id number] Patient Account Number: [id number] Date of Birth/Sex: Treating RN: [birth year] (74 y.o. Elam Dutch Primary Care Provider: Jillyn Ledger Other Clinician: Referring Provider: Treating Provider/Extender:Stone III, Lyndee Hensen, [name] in Treatment: 8 History of Present Illness HPI Description: ADMISSION 01/05/2019 This is a 74 year old woman with advanced COPD on chronic O2. She managed to strike her lower leg while getting out of a car on Thanksgiving day. She had a wound. The vent they tried to look after themselves at home eventually saw her primary doctor a week later. She was given Keflex and Neosporin. An x-ray was apparently done but I do not have these results. She was seen in the ER last night and given doxycycline this appointment was arranged. The patient has a 3.5 x 2 surface area of black eschar and wound. Swelling inferiorly suggestive of a hematoma. I see no current evidence of infection Past medical history type 2 diabetes recent hemoglobin A1c of 7.6 in  April. Chronic COPD the on oxygen. 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 ABI in this clinic was 1.3 on the right 12/18; the necrotic surface of this hematoma area remained intact which is somewhat surprising. The swelling around the area has gotten better. She is still having some discomfort managed with as needed tramadol 12/30-Patient returns with dense looking eschar on top of the hematoma on the right lateral leg calf area. Patient is on O2 around-the-clock 02/07/2019 upon evaluation today patient presents for reevaluation in the clinic concerning issues with her right lower extremity ulcer around the calf. She had a hematoma here that was debrided away. She does have a tunnel at 2:00 and overall this seems to be doing okay though there is some sign of infection at this point there is some erythema and warmth to touch around the wound that has me somewhat concerned. Fortunately there is no evidence of active infection systemically which is good news. The patient states in general that she is having some pain although this is not terrible she is okay with me attempting sharp debridement to clear away as much of the necrotic tissue as I can. 02/22/2019 upon evaluation today patient appears to be doing better with regard to her ulcer at this point. She has been tolerating the dressing changes without complication. I do feel like she is making good progress and I am very pleased in this regard. With that being said she is continuing to still have some discomfort but I do not feel like it is as bad as previous. I did review her culture today which revealed Staph epidermidis but I do not see really any need indicate any further antibiotics at this point it did show resistant to tetracycline but at the same time her wound appears  to be doing better. I do not believe  this is indicative of any type of infection at this point. My suggestion would be that she continue with her antibiotic for the time being to completion. 02/21/2019 on evaluation today patient appears to be doing well with regard to her leg ulcer. She is showing some signs of good granulation. With that being said unfortunately this is still somewhat slow to heal she still does have some depth. I do believe that potentially a snap VAC could be of benefit for her this was discussed with the patient and her son today as well it was a question they initially brought up about a wound VAC. I believe that it may actually be an excellent option for her and in fact would probably speed up the recovery quite a bit. She is actually very pleased to hear this and would like to proceed if at all possible with that today. 02/28/2019 upon evaluation today patient appears to be showing signs of excellent improvement based on what I am seeing currently. Fortunately there is no signs of active infection and in general I feel like she has been doing extremely well with regard to the snap VAC. In just 1 weeks time again this is excellent and dramatic improvement in my opinion. Overall I am very pleased. I definitely think we can continue as such. 03/07/2019 upon evaluation today patient actually appears to be doing quite well with regard to her wound at this point. She has been tolerating the dressing changes without complication. Fortunately there is no signs of active infection at this time. Overall I am very pleased with the progress. Snap VAC seems to be doing excellent for her. The undermining and tunneling areas of almost completely closed and there is just very tiny areas remaining in 2 spots at this time. I think we are very close to be able to switch to just something such as a Hydrofera Blue dressing. Electronic Signature(s) Signed: 03/07/2019 4:07:38 PM By: Worthy Keeler PA-C Entered By: Worthy Keeler on  03/07/2019 16:07:38 -------------------------------------------------------------------------------- Physical Exam Details Patient Name: Date of Service: [name], [name] 03/07/2019 3:00 PM Medical Record [id number] Patient Account Number: [id number] Date of Birth/Sex: Treating RN: 1945/04/01 (74 y.o. Elam Dutch Primary Care Provider: Jillyn Ledger Other Clinician: Referring Provider: Treating Provider/Extender:Stone III, Lyndee Hensen, [name] in Treatment: 8 Constitutional Well-nourished and well-hydrated in no acute distress. Respiratory normal breathing without difficulty. Psychiatric this patient is able to make decisions and demonstrates good insight into disease process. Alert and Oriented x 3. pleasant and cooperative. Notes Patient's wound currently showed signs of excellent granulation she seems to be measuring better and healing quite nicely. I am very pleased with how things have gone up to this point and I am hopeful that we may be able to get away with just 1 more week of the snap VAC and then transition to a Hydrofera Blue dressing to keep things moving in the correct direction. The patient is very pleased to hear that things are doing so well. Electronic Signature(s) Signed: 03/07/2019 4:08:15 PM By: Worthy Keeler PA-C Entered By: Worthy Keeler on 03/07/2019 16:08:14 -------------------------------------------------------------------------------- Physician Orders Details Patient Name: Date of Service: [name], [name] 03/07/2019 3:00 PM Medical Record [id number] Patient Account Number: [id number] Date of Birth/Sex: Treating RN: [birth year] (74 y.o. Elam Dutch Primary Care Provider: Jillyn Ledger Other Clinician: Referring Provider: Treating Provider/Extender:Stone III, Lyndee Hensen, [name] in Treatment: 8 Verbal / Phone Orders: No  Diagnosis Coding ICD-10 Coding Code Description [id number] Non-pressure chronic ulcer of  right calf with other specified severity S80.11XD Contusion of right lower leg, subsequent encounter Follow-up Appointments Return Appointment in 1 week. Dressing Change Frequency Do not change entire dressing for one week. Skin Barriers/Peri-Wound Care Skin Prep Wound Cleansing May shower with protection. - can use cast protector Negative Presssure Wound Therapy SNAP Vac to wound continuously at 139mm/hg pressure - tuck into undermined area at 1 o'clock Edema Control 3 Layer Compression System - Right Lower Extremity Avoid standing for long periods of time Elevate legs to the level of the heart or above for 30 minutes daily and/or when sitting, a frequency of: Exercise regularly Electronic Signature(s) Signed: 03/07/2019 5:39:41 PM By: Baruch Gouty RN, BSN Signed: 03/07/2019 6:32:46 PM By: Worthy Keeler PA-C Entered By: Baruch Gouty on 03/07/2019 16:04:39 -------------------------------------------------------------------------------- Problem List Details Patient Name: Date of Service: [name] 03/07/2019 3:00 PM Medical Record [id number] Patient Account Number: [id number] Date of Birth/Sex: Treating RN: 04/29/1945 (74 y.o. Elam Dutch Primary Care Provider: Jillyn Ledger Other Clinician: Referring Provider: Treating Provider/Extender:Stone III, Lyndee Hensen, [name] in Treatment: 8 Active Problems ICD-10 Evaluated Encounter Code Description Active Date Today Diagnosis L97.218 Non-pressure chronic ulcer of right calf with other 01/05/2019 No Yes specified severity S80.11XD Contusion of right lower leg, subsequent encounter 01/05/2019 No Yes Inactive Problems Resolved Problems Electronic Signature(s) Signed: 03/07/2019 3:36:52 PM By: Worthy Keeler PA-C Entered By: Worthy Keeler on 03/07/2019 15:36:52 -------------------------------------------------------------------------------- Progress Note Details Patient Name: Date of  Service: [name] 03/07/2019 3:00 PM Medical Record [id number] Patient Account Number: [id number] Date of Birth/Sex: Treating RN: [birth year] (74 y.o. Elam Dutch Primary Care Provider: Jillyn Ledger Other Clinician: Referring Provider: Treating Provider/Extender:Stone III, Lyndee Hensen, [name] in Treatment: 8 Subjective Chief Complaint Information obtained from Patient 01/05/2019; patient arrives in clinic for review of contusion on the right lateral calf area History of Present Illness (HPI) ADMISSION 01/05/2019 This is a 74 year old woman with advanced COPD on chronic O2. She managed to strike her lower leg while getting out of a car on Thanksgiving day. She had a wound. The vent they tried to look after themselves at home eventually saw her primary doctor a week later. She was given Keflex and Neosporin. An x-ray was apparently done but I do not have these results. She was seen in the ER last night and given doxycycline this appointment was arranged. The patient has a 3.5 x 2 surface area of black eschar and wound. Swelling inferiorly suggestive of a hematoma. I see no current evidence of infection Past medical history type 2 diabetes recent hemoglobin A1c of 7.6 in April. Chronic COPD the on oxygen. 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 ABI in this clinic was 1.3 on the right 12/18; the necrotic surface of this hematoma area remained intact which is somewhat surprising. The swelling around the area has gotten better. She is still having some discomfort managed with as needed tramadol 12/30-Patient returns with dense looking eschar on top of the hematoma on the right lateral leg calf area. Patient is on O2 around-the-clock 02/07/2019 upon evaluation today patient presents for reevaluation in the clinic concerning issues with  her right lower extremity ulcer around the calf. She had a hematoma here that was debrided away. She does have a tunnel at 2:00 and overall this seems to be doing okay though there  is some sign of infection at this point there is some erythema and warmth to touch around the wound that has me somewhat concerned. Fortunately there is no evidence of active infection systemically which is good news. The patient states in general that she is having some pain although this is not terrible she is okay with me attempting sharp debridement to clear away as much of the necrotic tissue as I can. 02/22/2019 upon evaluation today patient appears to be doing better with regard to her ulcer at this point. She has been tolerating the dressing changes without complication. I do feel like she is making good progress and I am very pleased in this regard. With that being said she is continuing to still have some discomfort but I do not feel like it is as bad as previous. I did review her culture today which revealed Staph epidermidis but I do not see really any need indicate any further antibiotics at this point it did show resistant to tetracycline but at the same time her wound appears to be doing better. I do not believe this is indicative of any type of infection at this point. My suggestion would be that she continue with her antibiotic for the time being to completion. 02/21/2019 on evaluation today patient appears to be doing well with regard to her leg ulcer. She is showing some signs of good granulation. With that being said unfortunately this is still somewhat slow to heal she still does have some depth. I do believe that potentially a snap VAC could be of benefit for her this was discussed with the patient and her son today as well it was a question they initially brought up about a wound VAC. I believe that it may actually be an excellent option for her and in fact would probably speed up the recovery quite  a bit. She is actually very pleased to hear this and would like to proceed if at all possible with that today. 02/28/2019 upon evaluation today patient appears to be showing signs of excellent improvement based on what I am seeing currently. Fortunately there is no signs of active infection and in general I feel like she has been doing extremely well with regard to the snap VAC. In just 1 weeks time again this is excellent and dramatic improvement in my opinion. Overall I am very pleased. I definitely think we can continue as such. 03/07/2019 upon evaluation today patient actually appears to be doing quite well with regard to her wound at this point. She has been tolerating the dressing changes without complication. Fortunately there is no signs of active infection at this time. Overall I am very pleased with the progress. Snap VAC seems to be doing excellent for her. The undermining and tunneling areas of almost completely closed and there is just very tiny areas remaining in 2 spots at this time. I think we are very close to be able to switch to just something such as a Hydrofera Blue dressing. Objective Constitutional Well-nourished and well-hydrated in no acute distress. Vitals Time Taken: 3:40 PM, Height: 62 in, Temperature: 98.2 F, Pulse: 72 bpm, Respiratory Rate: 20 breaths/min, Blood Pressure: 93/63 mmHg, Capillary Blood Glucose: 174 mg/dl. General Notes: patient denies weakness, or dizziness. PA made aware of BP. Respiratory normal breathing without difficulty. Psychiatric this patient is able to make decisions and demonstrates good insight into disease process. Alert and Oriented x 3. pleasant and cooperative. General Notes: Patient's wound currently showed signs of excellent granulation  she seems to be measuring better and healing quite nicely. I am very pleased with how things have gone up to this point and I am hopeful that we may be able to get away with just 1 more week of the  snap VAC and then transition to a Hydrofera Blue dressing to keep things moving in the correct direction. The patient is very pleased to hear that things are doing so well. Integumentary (Hair, Skin) Wound #1 status is Open. Original cause of wound was Trauma. The wound is located on the Right,Lateral Lower Leg. The wound measures 3.3cm length x 1.5cm width x 0.3cm depth; 3.888cm^2 area and 1.166cm^3 volume. There is Fat Layer (Subcutaneous Tissue) Exposed exposed. There is no tunneling or undermining noted. There is a medium amount of serosanguineous drainage noted. The wound margin is flat and intact. There is large (67-100%) red granulation within the wound bed. There is no necrotic tissue within the wound bed. 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 There was a Three Layer Compression Therapy Procedure by Carlene Coria, RN. Post procedure Diagnosis Wound #1: Same as Pre-Procedure Plan Follow-up Appointments: Return Appointment in 1 week. Dressing Change Frequency: Do not change entire dressing for one week. Skin Barriers/Peri-Wound Care: Skin Prep Wound Cleansing: May shower with protection. - can use cast protector Negative Presssure Wound Therapy: SNAP Vac to wound continuously at 174mm/hg pressure - tuck into undermined area at 1 o'clock Edema Control: 3 Layer Compression System - Right Lower Extremity Avoid standing for long periods of time Elevate legs to the level of the heart or above for 30 minutes daily and/or when sitting, a frequency of: Exercise regularly 1. I would recommend currently that we continue with the snap VAC for the lower extremity at this point that seems to be doing well the patient is in agreement the plan. 2. We will  also continue with a 3 layer compression wrap that seems to also have helped her and I think that we can continue as such. 3. I am also going to recommend she continue to elevate her legs as much as possible the more she is able to do this the better off she will be. We will see patient back for reevaluation in 1 week here in the clinic. If anything worsens or changes patient will contact our office for additional recommendations. Electronic Signature(s) Signed: 03/07/2019 4:08:44 PM By: Worthy Keeler PA-C Entered By: Worthy Keeler on 03/07/2019 16:08:43 -------------------------------------------------------------------------------- SuperBill Details Patient Name: Date of Service: [name] 03/07/2019 Medical Record [id number] Patient Account Number: [id number] Date of Birth/Sex: Treating RN: Nov 20, 1945 (74 y.o. Elam Dutch Primary Care Provider: Jillyn Ledger Other Clinician: Referring Provider: Treating Provider/Extender:Stone III, Lyndee Hensen, [name] in Treatment: 8 Diagnosis Coding ICD-10 Codes Code Description [phone] Non-pressure chronic ulcer of right calf with other specified severity S80.11XD Contusion of right lower leg, subsequent encounter Facility Procedures CPT4 Code Description: [id number] NEG PRESS WND TX <=50 SQ CM Modifier: Quantity: 1 CPT4 Code Description: [id number] (Facility Use Only) [id number] - APPLY MULTLAY COMPRS LWR RT LEG Modifier: 59 Quantity: 1 Physician Procedures CPT4 Code Description: [id number] - WC PHYS LEVEL 4 - EST PT ICD-10 Diagnosis Description L97.218 Non-pressure chronic ulcer of right calf with other spe S80.11XD Contusion of right lower leg, subsequent  encounter Modifier: cified severity Quantity: 1 Electronic Signature(s) Signed: 03/07/2019 4:08:56 PM By: Worthy Keeler PA-C Entered By: Worthy Keeler on 03/07/2019 16:08:55

## 2019-03-09 DIAGNOSIS — F259 Schizoaffective disorder, unspecified: Secondary | ICD-10-CM | POA: Diagnosis not present

## 2019-03-09 DIAGNOSIS — F419 Anxiety disorder, unspecified: Secondary | ICD-10-CM | POA: Diagnosis not present

## 2019-03-09 DIAGNOSIS — F331 Major depressive disorder, recurrent, moderate: Secondary | ICD-10-CM | POA: Diagnosis not present

## 2019-03-14 ENCOUNTER — Other Ambulatory Visit: Payer: Self-pay

## 2019-03-14 ENCOUNTER — Encounter (HOSPITAL_BASED_OUTPATIENT_CLINIC_OR_DEPARTMENT_OTHER): Payer: Medicare Other | Admitting: Physician Assistant

## 2019-03-14 DIAGNOSIS — L97212 Non-pressure chronic ulcer of right calf with fat layer exposed: Secondary | ICD-10-CM | POA: Diagnosis not present

## 2019-03-14 DIAGNOSIS — I872 Venous insufficiency (chronic) (peripheral): Secondary | ICD-10-CM | POA: Diagnosis not present

## 2019-03-14 NOTE — Progress Notes (Addendum)
ADDALYNE, AYOTTE ([id number]) Visit Report for 03/14/2019 Chief Complaint Document Details Patient Name: Date of Service: LEITA, FITCH 03/14/2019 3:15 PM Medical Record [id number] Patient Account Number: [id number] Date of Birth/Sex: Treating RN: 02-27-1945 (74 y.o. Elam Dutch Primary Care Provider: Jillyn Ledger Other Clinician: Referring Provider: Treating Provider/Extender:Stone III, Lyndee Hensen, [name] in Treatment: 9 Information Obtained from: Patient Chief Complaint 01/05/2019; patient arrives in clinic for review of contusion on the right lateral calf area Electronic Signature(s) Signed: 03/14/2019 3:53:27 PM By: Worthy Keeler PA-C Entered By: Worthy Keeler on 03/14/2019 15:53:26 -------------------------------------------------------------------------------- HPI Details Patient Name: Date of Service: [name] 03/14/2019 3:15 PM Medical Record [id number] Patient Account Number: [id number] Date of Birth/Sex: Treating RN: 1945-03-17 (74 y.o. Elam Dutch Primary Care Provider: Jillyn Ledger Other Clinician: Referring Provider: Treating Provider/Extender:Stone III, Lyndee Hensen, [name] in Treatment: 9 History of Present Illness HPI Description: ADMISSION 01/05/2019 This is a 74 year old woman with advanced COPD on chronic O2. She managed to strike her lower leg while getting out of a car on Thanksgiving day. She had a wound. The vent they tried to look after themselves at home eventually saw her primary doctor a week later. She was given Keflex and Neosporin. An x-ray was apparently done but I do not have these results. She was seen in the ER last night and given doxycycline this appointment was arranged. The patient has a 3.5 x 2 surface area of black eschar and wound. Swelling inferiorly suggestive of a hematoma. I see no current evidence of infection Past medical history type 2 diabetes recent hemoglobin A1c of 7.6 in  April. Chronic COPD the on oxygen. 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 ABI in this clinic was 1.3 on the right 12/18; the necrotic surface of this hematoma area remained intact which is somewhat surprising. The swelling around the area has gotten better. She is still having some discomfort managed with as needed tramadol 12/30-Patient returns with dense looking eschar on top of the hematoma on the right lateral leg calf area. Patient is on O2 around-the-clock 02/07/2019 upon evaluation today patient presents for reevaluation in the clinic concerning issues with her right lower extremity ulcer around the calf. She had a hematoma here that was debrided away. She does have a tunnel at 2:00 and overall this seems to be doing okay though there is some sign of infection at this point there is some erythema and warmth to touch around the wound that has me somewhat concerned. Fortunately there is no evidence of active infection systemically which is good news. The patient states in general that she is having some pain although this is not terrible she is okay with me attempting sharp debridement to clear away as much of the necrotic tissue as I can. 02/22/2019 upon evaluation today patient appears to be doing better with regard to her ulcer at this point. She has been tolerating the dressing changes without complication. I do feel like she is making good progress and I am very pleased in this regard. With that being said she is continuing to still have some discomfort but I do not feel like it is as bad as previous. I did review her culture today which revealed Staph epidermidis but I do not see really any need indicate any further antibiotics at this point it did show resistant to tetracycline but at the same time her wound appears  to be doing better. I do not believe  this is indicative of any type of infection at this point. My suggestion would be that she continue with her antibiotic for the time being to completion. 02/21/2019 on evaluation today patient appears to be doing well with regard to her leg ulcer. She is showing some signs of good granulation. With that being said unfortunately this is still somewhat slow to heal she still does have some depth. I do believe that potentially a snap VAC could be of benefit for her this was discussed with the patient and her son today as well it was a question they initially brought up about a wound VAC. I believe that it may actually be an excellent option for her and in fact would probably speed up the recovery quite a bit. She is actually very pleased to hear this and would like to proceed if at all possible with that today. 02/28/2019 upon evaluation today patient appears to be showing signs of excellent improvement based on what I am seeing currently. Fortunately there is no signs of active infection and in general I feel like she has been doing extremely well with regard to the snap VAC. In just 1 weeks time again this is excellent and dramatic improvement in my opinion. Overall I am very pleased. I definitely think we can continue as such. 03/07/2019 upon evaluation today patient actually appears to be doing quite well with regard to her wound at this point. She has been tolerating the dressing changes without complication. Fortunately there is no signs of active infection at this time. Overall I am very pleased with the progress. Snap VAC seems to be doing excellent for her. The undermining and tunneling areas of almost completely closed and there is just very tiny areas remaining in 2 spots at this time. I think we are very close to be able to switch to just something such as a Hydrofera Blue dressing. 03/14/19 upon evaluation today patient appears to be doing very well in regard to her old lateral leg ulcer. She  is been tolerating the dressing changes without complication fortunately there's no evidence of active infection at this time. I do believe we can likely discontinue the Snap Vac as of today. Electronic Signature(s) Signed: 03/15/2019 12:37:21 PM By: Worthy Keeler PA-C Entered By: Worthy Keeler on 03/15/2019 11:03:03 -------------------------------------------------------------------------------- Physical Exam Details Patient Name: Date of Service: AVANA, JANIAK 03/14/2019 3:15 PM Medical Record [id number] Patient Account Number: [id number] Date of Birth/Sex: Treating RN: 29-Jul-1945 (74 y.o. Elam Dutch Primary Care Provider: Jillyn Ledger Other Clinician: Referring Provider: Treating Provider/Extender:Stone III, Lyndee Hensen, [name] in Treatment: 9 Constitutional Well-nourished and well-hydrated in no acute distress. Respiratory normal breathing without difficulty. Psychiatric this patient is able to make decisions and demonstrates good insight into disease process. Alert and Oriented x 3. pleasant and cooperative. Notes Patient's wound currently did not require any sharp debridement she also has no regions undermining or tunneling at this point which is great news and overall I feel like she is managing quite nicely here. She states she is happy to hear that she does not have to wear the Snap Vac any longer as she was getting tired of having to wear that. Electronic Signature(s) Signed: 03/15/2019 12:37:21 PM By: Worthy Keeler PA-C Entered By: Worthy Keeler on 03/15/2019 11:03:36 -------------------------------------------------------------------------------- Physician Orders Details Patient Name: Date of Service: [name] 03/14/2019 3:15 PM Medical Record [id number] Patient Account Number: [id number] Date  of Birth/Sex: Treating RN: October 06, 1945 (74 y.o. Elam Dutch Primary Care Provider: Jillyn Ledger Other  Clinician: Referring Provider: Treating Provider/Extender:Stone III, Lyndee Hensen, [name] in Treatment: 9 Verbal / Phone Orders: No Diagnosis Coding ICD-10 Coding Code Description [id number] Non-pressure chronic ulcer of right calf with other specified severity S80.11XD Contusion of right lower leg, subsequent encounter Follow-up Appointments Return Appointment in 1 week. Dressing Change Frequency Do not change entire dressing for one week. Skin Barriers/Peri-Wound Care Wound #1 Right,Lateral Lower Leg Moisturizing lotion - to leg Wound Cleansing May shower with protection. - can use cast protector Primary Wound Dressing Wound #1 Right,Lateral Lower Leg Hydrofera Blue Secondary Dressing Wound #1 Right,Lateral Lower Leg Dry Gauze Edema Control 3 Layer Compression System - Right Lower Extremity Avoid standing for long periods of time Elevate legs to the level of the heart or above for 30 minutes daily and/or when sitting, a frequency of: Exercise regularly Electronic Signature(s) Signed: 03/15/2019 12:37:21 PM By: Worthy Keeler PA-C Signed: 03/16/2019 6:14:01 PM By: Baruch Gouty RN, BSN Entered By: Baruch Gouty on 03/14/2019 17:17:28 -------------------------------------------------------------------------------- Problem List Details Patient Name: Date of Service: [name] 03/14/2019 3:15 PM Medical Record [id number] Patient Account Number: [id number] Date of Birth/Sex: Treating RN: May 09, 1945 (74 y.o. Elam Dutch Primary Care Provider: Jillyn Ledger Other Clinician: Referring Provider: Treating Provider/Extender:Stone III, Lyndee Hensen, [name] in Treatment: 9 Active Problems ICD-10 Evaluated Encounter Code Description Active Date Today Diagnosis L97.218 Non-pressure chronic ulcer of right calf with other 01/05/2019 No Yes specified severity S80.11XD Contusion of right lower leg, subsequent encounter 01/05/2019 No Yes Inactive  Problems Resolved Problems Electronic Signature(s) Signed: 03/14/2019 3:53:21 PM By: Worthy Keeler PA-C Entered By: Worthy Keeler on 03/14/2019 15:53:20 -------------------------------------------------------------------------------- Progress Note Details Patient Name: Date of Service: [name] 03/14/2019 3:15 PM Medical Record [id number] Patient Account Number: [id number] Date of Birth/Sex: Treating RN: 07/12/1945 (74 y.o. Elam Dutch Primary Care Provider: Jillyn Ledger Other Clinician: Referring Provider: Treating Provider/Extender:Stone III, Lyndee Hensen, [name] in Treatment: 9 Subjective Chief Complaint Information obtained from Patient 01/05/2019; patient arrives in clinic for review of contusion on the right lateral calf area History of Present Illness (HPI) ADMISSION 01/05/2019 This is a 74 year old woman with advanced COPD on chronic O2. She managed to strike her lower leg while getting out of a car on Thanksgiving day. She had a wound. The vent they tried to look after themselves at home eventually saw her primary doctor a week later. She was given Keflex and Neosporin. An x-ray was apparently done but I do not have these results. She was seen in the ER last night and given doxycycline this appointment was arranged. The patient has a 3.5 x 2 surface area of black eschar and wound. Swelling inferiorly suggestive of a hematoma. I see no current evidence of infection Past medical history type 2 diabetes recent hemoglobin A1c of 7.6 in April. Chronic COPD the on oxygen. 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 ABI in this clinic was 1.3 on the right 12/18; the necrotic surface of this hematoma area remained intact which is somewhat surprising. The swelling around the area has gotten better. She is still having some  discomfort managed with as needed tramadol 12/30-Patient returns with dense looking eschar on top of the hematoma on the right lateral leg calf area. Patient is on O2 around-the-clock 02/07/2019 upon evaluation today patient presents for  reevaluation in the clinic concerning issues with her right lower extremity ulcer around the calf. She had a hematoma here that was debrided away. She does have a tunnel at 2:00 and overall this seems to be doing okay though there is some sign of infection at this point there is some erythema and warmth to touch around the wound that has me somewhat concerned. Fortunately there is no evidence of active infection systemically which is good news. The patient states in general that she is having some pain although this is not terrible she is okay with me attempting sharp debridement to clear away as much of the necrotic tissue as I can. 02/22/2019 upon evaluation today patient appears to be doing better with regard to her ulcer at this point. She has been tolerating the dressing changes without complication. I do feel like she is making good progress and I am very pleased in this regard. With that being said she is continuing to still have some discomfort but I do not feel like it is as bad as previous. I did review her culture today which revealed Staph epidermidis but I do not see really any need indicate any further antibiotics at this point it did show resistant to tetracycline but at the same time her wound appears to be doing better. I do not believe this is indicative of any type of infection at this point. My suggestion would be that she continue with her antibiotic for the time being to completion. 02/21/2019 on evaluation today patient appears to be doing well with regard to her leg ulcer. She is showing some signs of good granulation. With that being said unfortunately this is still somewhat slow to heal she still does have some depth. I do believe that  potentially a snap VAC could be of benefit for her this was discussed with the patient and her son today as well it was a question they initially brought up about a wound VAC. I believe that it may actually be an excellent option for her and in fact would probably speed up the recovery quite a bit. She is actually very pleased to hear this and would like to proceed if at all possible with that today. 02/28/2019 upon evaluation today patient appears to be showing signs of excellent improvement based on what I am seeing currently. Fortunately there is no signs of active infection and in general I feel like she has been doing extremely well with regard to the snap VAC. In just 1 weeks time again this is excellent and dramatic improvement in my opinion. Overall I am very pleased. I definitely think we can continue as such. 03/07/2019 upon evaluation today patient actually appears to be doing quite well with regard to her wound at this point. She has been tolerating the dressing changes without complication. Fortunately there is no signs of active infection at this time. Overall I am very pleased with the progress. Snap VAC seems to be doing excellent for her. The undermining and tunneling areas of almost completely closed and there is just very tiny areas remaining in 2 spots at this time. I think we are very close to be able to switch to just something such as a Hydrofera Blue dressing. 03/14/19 upon evaluation today patient appears to be doing very well in regard to her old lateral leg ulcer. She is been tolerating the dressing changes without complication fortunately there's no evidence of active infection at this time. I do believe we can likely discontinue  the Snap Vac as of today. Objective Constitutional Well-nourished and well-hydrated in no acute distress. Vitals Time Taken: 4:15 PM, Height: 62 in, Temperature: 98.7 F, Pulse: 75 bpm, Respiratory Rate: 20 breaths/min, Blood Pressure: 99/40  mmHg, Capillary Blood Glucose: 214 mg/dl. Respiratory normal breathing without difficulty. Psychiatric this patient is able to make decisions and demonstrates good insight into disease process. Alert and Oriented x 3. pleasant and cooperative. General Notes: Patient's wound currently did not require any sharp debridement she also has no regions undermining or tunneling at this point which is great news and overall I feel like she is managing quite nicely here. She states she is happy to hear that she does not have to wear the Snap Vac any longer as she was getting tired of having to wear that. Integumentary (Hair, Skin) Wound #1 status is Open. Original cause of wound was Trauma. The wound is located on the Right,Lateral Lower Leg. The wound measures 2.8cm length x 1.3cm width x 0.2cm depth; 2.859cm^2 area and 0.572cm^3 volume. There is Fat Layer (Subcutaneous Tissue) Exposed exposed. There is no tunneling or undermining noted. There is a medium amount of serosanguineous drainage noted. The wound margin is flat and intact. There is large (67-100%) red granulation within the wound bed. There is no necrotic tissue within the wound bed. 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 There was a Three Layer Compression Therapy Procedure by Carlene Coria, RN. Post procedure Diagnosis Wound #1: Same as Pre-Procedure Plan Follow-up Appointments: Return Appointment in 1 week. Dressing Change Frequency: Do not change entire dressing for one week. Skin Barriers/Peri-Wound Care: Wound #1 Right,Lateral Lower Leg: Moisturizing lotion - to leg Wound Cleansing: May shower with protection. - can use cast protector Primary Wound Dressing: Wound #1 Right,Lateral Lower  Leg: Hydrofera Blue Secondary Dressing: Wound #1 Right,Lateral Lower Leg: Dry Gauze Edema Control: 3 Layer Compression System - Right Lower Extremity Avoid standing for long periods of time Elevate legs to the level of the heart or above for 30 minutes daily and/or when sitting, a frequency of: Exercise regularly Upon inspection today the patient's wound is making good progress and I do believe that we can switch to [facility] Dressing this point. She is in agreement with that plan. I will likely also recommend that we continue with the three layer compression wrap I still think that's gonna be necessary in order to help with edema control which will in turn help the wound heal more completely and quickly. Please see above for specific wound care orders. We will see patient for re-evaluation in 1 week(s) here in the clinic. If anything worsens or changes patient will contact our office for additional recommendations. Electronic Signature(s) Signed: 03/15/2019 12:37:21 PM By: Worthy Keeler PA-C Entered By: Worthy Keeler on 03/15/2019 11:04:34 -------------------------------------------------------------------------------- SuperBill Details Patient Name: Date of Service: [name] 03/14/2019 Medical Record [id number] Patient Account Number: [id number] Date of Birth/Sex: Treating RN: [birth year] (74 y.o. Elam Dutch Primary Care Provider: Jillyn Ledger Other Clinician: Referring Provider: Treating Provider/Extender:Stone III, Lyndee Hensen, [name] in Treatment: 9 Diagnosis Coding ICD-10 Codes Code Description L97.218 Non-pressure chronic ulcer of right calf with other specified severity S80.11XD Contusion of right lower leg, subsequent encounter Facility Procedures CPT4 Code Description: [id number] (Facility Use Only) [facility] LWR RT LEG Modifier: Quantity: 1 Physician Procedures CPT4 Code Description: [id number] - WC PHYS  LEVEL 3 - EST PT ICD-10 Diagnosis Description [id number] Non-pressure chronic ulcer of right calf with other spe S80.11XD Contusion of right lower leg, subsequent encounter Modifier: cified severity Quantity: 1 Electronic Signature(s) Signed: 03/15/2019 12:37:21 PM By: Worthy Keeler PA-C Entered By: Worthy Keeler on 03/15/2019 11:04:51

## 2019-03-16 NOTE — Progress Notes (Signed)
BRYNLEY, CUDDEBACK ([id number]) Visit Report for 03/14/2019 Arrival Information Details Patient Name: Date of Service: ANJELA, CASSARA 03/14/2019 3:15 PM Medical Record [id number] Patient Account Number: [id number] Date of Birth/Sex: Treating RN: 10-Nov-1945 (74 y.o. Helene Shoe, Meta.Reding Primary Care Nakeya Adinolfi: BRAKE, ANDREW Other Clinician: Referring Larkin Morelos: Treating Creedon Danielski/Extender:Stone III, Lyndee Hensen, [name] in Treatment: 9 Visit Information History Since Last Visit Added or deleted any medications: No Patient Arrived: Wheel Chair Any new allergies or adverse reactions: No Arrival Time: 16:10 Had a fall or experienced change in No activities of daily living that may affect Accompanied By: son risk of falls: Transfer Assistance: None Signs or symptoms of abuse/neglect since last No Patient Identification Verified: Yes visito Secondary Verification Process Yes Hospitalized since last visit: No Completed: Implantable device outside of the clinic excluding No Patient Requires Transmission-Based No cellular tissue based products placed in the center Precautions: since last visit: Patient Has Alerts: No Has Dressing in Place as Prescribed: Yes Has Compression in Place as Prescribed: Yes Pain Present Now: No Electronic Signature(s) Signed: 03/14/2019 5:44:15 PM By: Deon Pilling Entered By: Deon Pilling on 03/14/2019 16:16:54 -------------------------------------------------------------------------------- Compression Therapy Details Patient Name: Date of Service: LUCIELLE, VOKES 03/14/2019 3:15 PM Medical Record [id number] Patient Account Number: [id number] Date of Birth/Sex: Treating RN: [birth year] (74 y.o. Elam Dutch Primary Care Reda Gettis: Jillyn Ledger Other Clinician: Referring Selmer Adduci: Treating Brannen Koppen/Extender:Stone III, Lyndee Hensen, [name] in Treatment: 9 Compression Therapy Performed for Wound Wound #1 Right,Lateral Lower  Leg Assessment: Performed By: Clinician Carlene Coria, RN Compression Type: Three Layer Post Procedure Diagnosis Same as Pre-procedure Electronic Signature(s) Signed: 03/16/2019 6:14:01 PM By: Baruch Gouty RN, BSN Entered By: Baruch Gouty on 03/14/2019 17:16:20 -------------------------------------------------------------------------------- Encounter Discharge Information Details Patient Name: Date of Service: [name] 03/14/2019 3:15 PM Medical Record [id number] Patient Account Number: [id number] Date of Birth/Sex: Treating RN: 01-May-1945 (74 y.o. [name] Primary Care Elvenia Godden: Jillyn Ledger Other Clinician: Referring Hester Forget: Treating Ketty Bitton/Extender:Stone III, Lyndee Hensen, [name] in Treatment: 9 Encounter Discharge Information Items Discharge Condition: Stable Ambulatory Status: Wheelchair Discharge Destination: Home Transportation: Private Auto Accompanied By: son Schedule Follow-up Appointment: Yes Clinical Summary of Care: Electronic Signature(s) Signed: 03/14/2019 5:44:15 PM By: Deon Pilling Entered By: Deon Pilling on 03/14/2019 17:37:06 -------------------------------------------------------------------------------- Lower Extremity Assessment Details Patient Name: Date of Service: JENAYA, SAAR 03/14/2019 3:15 PM Medical Record [id number] Patient Account Number: [id number] Date of Birth/Sex: Treating RN: Feb 17, 1945 (74 y.o. [name] Primary Care Disney Ruggiero: BRAKE, ANDREW Other Clinician: Referring Jacoya Bauman: Treating Azana Kiesler/Extender:Stone III, Lyndee Hensen, [name] in Treatment: 9 Edema Assessment Assessed: [Left: No] [Right: Yes] Edema: [Left: Ye] [Right: s] Calf Left: Right: Point of Measurement: 28 cm From Medial Instep cm 37.5 cm Ankle Left: Right: Point of Measurement: 10 cm From Medial Instep cm 22 cm Vascular Assessment Pulses: Dorsalis Pedis Palpable: [Right:Yes] Electronic  Signature(s) Signed: 03/14/2019 5:44:15 PM By: Deon Pilling Entered By: Deon Pilling on 03/14/2019 16:19:58 -------------------------------------------------------------------------------- Multi-Disciplinary Care Plan Details Patient Name: Date of Service: [name] 03/14/2019 3:15 PM Medical Record [id number] Patient Account Number: [id number] Date of Birth/Sex: Treating RN: Jan 21, 1946 (74 y.o. Elam Dutch Primary Care Justus Droke: Jillyn Ledger Other Clinician: Referring Liborio Saccente: Treating Siyona Coto/Extender:Stone III, Lyndee Hensen, [name] in Treatment: 9 Active Inactive Venous Leg Ulcer Nursing Diagnoses: Potential for venous Insuffiency (use before diagnosis confirmed) Goals: Patient will maintain optimal edema control Date Initiated: 02/07/2019 Target Resolution Date: 04/04/2019 Goal Status: Active Interventions: Assess peripheral edema status every visit. Compression as ordered  Provide education on venous insufficiency Treatment Activities: Therapeutic compression applied : 02/07/2019 Notes: Wound/Skin Impairment Nursing Diagnoses: Impaired tissue integrity Goals: Patient/caregiver will verbalize understanding of skin care regimen Date Initiated: 02/07/2019 Target Resolution Date: 04/04/2019 Goal Status: Active Ulcer/skin breakdown will have a volume reduction of 30% by week 4 Date Initiated: 01/05/2019 Date Inactivated: 02/07/2019 Target Resolution Date: 02/02/2019 Unmet Reason: hematoma Goal Status: Unmet unroofed Ulcer/skin breakdown will have a volume reduction of 50% by week 8 Date Initiated: 02/07/2019 Date Inactivated: 03/07/2019 Target Resolution Date: 03/07/2019 Goal Status: Met Ulcer/skin breakdown will have a volume reduction of 80% by week 12 Date Initiated: 03/07/2019 Target Resolution Date: 04/04/2019 Goal Status: Active Interventions: Provide education on smoking Provide education on ulcer and skin care Notes: Electronic  Signature(s) Signed: 03/16/2019 6:14:01 PM By: Baruch Gouty RN, BSN Entered By: Baruch Gouty on 03/14/2019 17:15:34 -------------------------------------------------------------------------------- Pain Assessment Details Patient Name: Date of Service: [name] 03/14/2019 3:15 PM Medical Record [id number] Patient Account Number: [id number] Date of Birth/Sex: Treating RN: [birth year] (74 y.o. [name] Primary Care Larrisha Babineau: Jillyn Ledger Other Clinician: Referring Ndia Sampath: Treating Jacody Beneke/Extender:Stone III, Lyndee Hensen, [name] in Treatment: 9 Active Problems Location of Pain Severity and Description of Pain Patient Has Paino No Site Locations Rate the pain. Current Pain Level: 0 Pain Management and Medication Current Pain Management: Medication: No Cold Application: No Rest: No Massage: No Activity: No T.E.N.S.: No Heat Application: No Leg drop or elevation: No Is the Current Pain Management Adequate: Adequate How does your wound impact your activities of daily livingo Sleep: No Bathing: No Appetite: No Relationship With Others: No Bladder Continence: No Emotions: No Bowel Continence: No Work: No Toileting: No Drive: No Dressing: No Hobbies: No Electronic Signature(s) Signed: 03/14/2019 5:44:15 PM By: Deon Pilling Entered By: Deon Pilling on 03/14/2019 16:18:26 -------------------------------------------------------------------------------- Patient/Caregiver Education Details Patient Name: Date of Service: [name] 2/17/2021andnbsp3:15 PM Medical Record [id number] Patient Account Number: [id number] Date of Birth/Gender: Treating RN: 07-Aug-1945 (74 y.o. Elam Dutch Primary Care Physician: Jillyn Ledger Other Clinician: Referring Physician: Treating Physician/Extender:Stone III, Lyndee Hensen, [name] in Treatment: 9 Education Assessment Education Provided To: Patient Education Topics  Provided Venous: Methods: Explain/Verbal Responses: Reinforcements needed, State content correctly Wound/Skin Impairment: Methods: Explain/Verbal Responses: Reinforcements needed, State content correctly Electronic Signature(s) Signed: 03/16/2019 6:14:01 PM By: Baruch Gouty RN, BSN Entered By: Baruch Gouty on 03/14/2019 17:15:52 -------------------------------------------------------------------------------- Wound Assessment Details Patient Name: Date of Service: [name] 03/14/2019 3:15 PM Medical Record [id number] Patient Account Number: [id number] Date of Birth/Sex: Treating RN: 20-Feb-1945 (74 y.o. Elam Dutch Primary Care Paytin Ramakrishnan: BRAKE, Mitzi Hansen Other Clinician: Referring Darleene Cumpian: Treating Myrle Dues/Extender:Stone III, Lyndee Hensen, [name] in Treatment: 9 Wound Status Wound Number: 1 Primary Venous Leg Ulcer Etiology: Wound Location: Right Lower Leg - Lateral Wound Open Wounding Event: Trauma Status: Date Acquired: 12/21/2018 Comorbid Glaucoma, Chronic Obstructive Pulmonary Weeks Of Treatment: 9 History: Disease (COPD), Hypertension, Type II Clustered Wound: No Diabetes, Osteoarthritis, Osteomyelitis, Neuropathy, Confinement Anxiety Photos Wound Measurements Length: (cm) 2.8 % Reduct Width: (cm) 1.3 % Reduct Depth: (cm) 0.2 Epitheli Area: (cm) 2.859 Tunneli Volume: (cm) 0.572 Undermi Wound Description Classification: Full Thickness Without Exposed Support Foul Od Structures Slough/ Wound Flat and Intact Margin: Exudate Exudate Medium Amount: Exudate Serosanguineous Type: Exudate red, brown Color: Wound Bed Granulation Amount: Large (67-100%) Granulation Quality: Red Fascia Exp Necrotic Amount: None Present (0%) Fat Layer Tendon Exp Muscle Exp Joint Expo Bone Expos or After Cleansing: No Fibrino No Exposed Structure osed: No (  Subcutaneous Tissue) Exposed: Yes osed: No osed: No sed: No ed: No ion in Area:  48% ion in Volume: -4% alization: Large (67-100%) ng: No ning: No Treatment Notes Wound #1 (Right, Lateral Lower Leg) 1. Cleanse With Wound Cleanser Soap and water 2. Periwound Care Moisturizing lotion 3. Primary Dressing Applied Hydrofera Blue 4. Secondary Dressing Dry Gauze 6. Support Layer Applied 3 layer compression wrap Notes netting. Electronic Signature(s) Signed: 03/16/2019 5:06:31 PM By: Mikeal Hawthorne EMT/HBOT Signed: 03/16/2019 6:14:01 PM By: Baruch Gouty RN, BSN Previous Signature: 03/14/2019 5:44:15 PM Version By: Deon Pilling Entered By: Mikeal Hawthorne on 03/16/2019 14:26:49 -------------------------------------------------------------------------------- Vitals Details Patient Name: Date of Service: [name] 03/14/2019 3:15 PM Medical Record [id number] Patient Account Number: [id number] Date of Birth/Sex: Treating RN: 25-Mar-1945 (74 y.o. [name] Primary Care Jonia Oakey: BRAKE, ANDREW Other Clinician: Referring Omere Marti: Treating Fadi Menter/Extender:Stone III, Lyndee Hensen, [name] in Treatment: 9 Vital Signs Time Taken: 16:15 Temperature (F): 98.7 Height (in): 62 Pulse (bpm): 75 Respiratory Rate (breaths/min): 20 Blood Pressure (mmHg): 99/40 Capillary Blood Glucose (mg/dl): 214 Reference Range: 80 - 120 mg / dl Electronic Signature(s) Signed: 03/14/2019 5:44:15 PM By: Deon Pilling Entered By: Deon Pilling on 03/14/2019 16:17:28

## 2019-03-21 ENCOUNTER — Other Ambulatory Visit: Payer: Self-pay

## 2019-03-21 ENCOUNTER — Encounter (HOSPITAL_BASED_OUTPATIENT_CLINIC_OR_DEPARTMENT_OTHER): Payer: Medicare Other | Admitting: Physician Assistant

## 2019-03-21 DIAGNOSIS — I872 Venous insufficiency (chronic) (peripheral): Secondary | ICD-10-CM | POA: Diagnosis not present

## 2019-03-21 DIAGNOSIS — L97212 Non-pressure chronic ulcer of right calf with fat layer exposed: Secondary | ICD-10-CM | POA: Diagnosis not present

## 2019-03-21 NOTE — Progress Notes (Addendum)
[name], [name] ([id number]) Visit Report for 03/21/2019 Chief Complaint Document Details Patient Name: Date of Service: [name], [name] 03/21/2019 3:15 PM Medical Record [id number] Patient Account Number: [id number] Date of Birth/Sex: Treating RN: 1945/05/02 (74 y.o. Elam Dutch Primary Care Provider: Jillyn Ledger Other Clinician: Referring Provider: Treating Provider/Extender:Stone III, Lyndee Hensen, [name] in Treatment: 10 Information Obtained from: Patient Chief Complaint 01/05/2019; patient arrives in clinic for review of contusion on the right lateral calf area Electronic Signature(s) Signed: 03/21/2019 3:24:51 PM By: Worthy Keeler PA-C Entered By: Worthy Keeler on 03/21/2019 15:24:50 -------------------------------------------------------------------------------- HPI Details Patient Name: Date of Service: [name] 03/21/2019 3:15 PM Medical Record [id number] Patient Account Number: [id number] Date of Birth/Sex: Treating RN: [birth year] (74 y.o. Elam Dutch Primary Care Provider: Jillyn Ledger Other Clinician: Referring Provider: Treating Provider/Extender:Stone III, Lyndee Hensen, [name] in Treatment: 10 History of Present Illness HPI Description: ADMISSION 01/05/2019 This is a 74 year old woman with advanced COPD on chronic O2. She managed to strike her lower leg while getting out of a car on Thanksgiving day. She had a wound. The vent they tried to look after themselves at home eventually saw her primary doctor a week later. She was given Keflex and Neosporin. An x-ray was apparently done but I do not have these results. She was seen in the ER last night and given doxycycline this appointment was arranged. The patient has a 3.5 x 2 surface area of black eschar and wound. Swelling inferiorly suggestive of a hematoma. I see no current evidence of infection Past medical history type 2 diabetes recent hemoglobin A1c of 7.6  in April. Chronic COPD the on oxygen. 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 ABI in this clinic was 1.3 on the right 12/18; the necrotic surface of this hematoma area remained intact which is somewhat surprising. The swelling around the area has gotten better. She is still having some discomfort managed with as needed tramadol 12/30-Patient returns with dense looking eschar on top of the hematoma on the right lateral leg calf area. Patient is on O2 around-the-clock 02/07/2019 upon evaluation today patient presents for reevaluation in the clinic concerning issues with her right lower extremity ulcer around the calf. She had a hematoma here that was debrided away. She does have a tunnel at 2:00 and overall this seems to be doing okay though there is some sign of infection at this point there is some erythema and warmth to touch around the wound that has me somewhat concerned. Fortunately there is no evidence of active infection systemically which is good news. The patient states in general that she is having some pain although this is not terrible she is okay with me attempting sharp debridement to clear away as much of the necrotic tissue as I can. 02/22/2019 upon evaluation today patient appears to be doing better with regard to her ulcer at this point. She has been tolerating the dressing changes without complication. I do feel like she is making good progress and I am very pleased in this regard. With that being said she is continuing to still have some discomfort but I do not feel like it is as bad as previous. I did review her culture today which revealed Staph epidermidis but I do not see really any need indicate any further antibiotics at this point it did show resistant to tetracycline but at the same time her wound appears  to be doing better. I do not believe  this is indicative of any type of infection at this point. My suggestion would be that she continue with her antibiotic for the time being to completion. 02/21/2019 on evaluation today patient appears to be doing well with regard to her leg ulcer. She is showing some signs of good granulation. With that being said unfortunately this is still somewhat slow to heal she still does have some depth. I do believe that potentially a snap VAC could be of benefit for her this was discussed with the patient and her son today as well it was a question they initially brought up about a wound VAC. I believe that it may actually be an excellent option for her and in fact would probably speed up the recovery quite a bit. She is actually very pleased to hear this and would like to proceed if at all possible with that today. 02/28/2019 upon evaluation today patient appears to be showing signs of excellent improvement based on what I am seeing currently. Fortunately there is no signs of active infection and in general I feel like she has been doing extremely well with regard to the snap VAC. In just 1 weeks time again this is excellent and dramatic improvement in my opinion. Overall I am very pleased. I definitely think we can continue as such. 03/07/2019 upon evaluation today patient actually appears to be doing quite well with regard to her wound at this point. She has been tolerating the dressing changes without complication. Fortunately there is no signs of active infection at this time. Overall I am very pleased with the progress. Snap VAC seems to be doing excellent for her. The undermining and tunneling areas of almost completely closed and there is just very tiny areas remaining in 2 spots at this time. I think we are very close to be able to switch to just something such as a Hydrofera Blue dressing. 03/14/19 upon evaluation today patient appears to be doing very well in regard to her old lateral leg ulcer. She  is been tolerating the dressing changes without complication fortunately there's no evidence of active infection at this time. I do believe we can likely discontinue the Snap Vac as of today. 03/21/2019 upon evaluation today the patient appears to be doing excellent at this point with regard to her leg ulcer. She has been tolerating the dressing changes and overall seems to be faring quite nicely. I am very pleased with the progress she has made and how the appearance of the wound is today. Electronic Signature(s) Signed: 03/21/2019 5:44:02 PM By: Worthy Keeler PA-C Entered By: Worthy Keeler on 03/21/2019 17:44:01 -------------------------------------------------------------------------------- Physical Exam Details Patient Name: Date of Service: [name], [name] 03/21/2019 3:15 PM Medical Record [id number] Patient Account Number: [id number] Date of Birth/Sex: Treating RN: Sep 28, 1945 (74 y.o. Elam Dutch Primary Care Provider: Jillyn Ledger Other Clinician: Referring Provider: Treating Provider/Extender:Stone III, Lyndee Hensen, [name] in Treatment: 10 Constitutional Well-nourished and well-hydrated in no acute distress. Respiratory normal breathing without difficulty. Psychiatric this patient is able to make decisions and demonstrates good insight into disease process. Alert and Oriented x 3. pleasant and cooperative. Notes Patient's wound bed currently showed signs of excellent granulation and epithelization there is no depth to the wound at all and the epithelial tissue seems to be margin across the wound bed quite nicely. I am very pleased and hopeful this will continue to make good progress over the next couple of  weeks. Electronic Signature(s) Signed: 03/21/2019 5:44:20 PM By: Worthy Keeler PA-C Entered By: Worthy Keeler on 03/21/2019 17:44:19 -------------------------------------------------------------------------------- Physician Orders  Details Patient Name: Date of Service: [name] 03/21/2019 3:15 PM Medical Record [phone] Patient Account Number: [id number] Date of Birth/Sex: Treating RN: 22-Feb-1945 (74 y.o. Elam Dutch Primary Care Provider: Jillyn Ledger Other Clinician: Referring Provider: Treating Provider/Extender:Stone III, Lyndee Hensen, [name] in Treatment: 10 Verbal / Phone Orders: No Diagnosis Coding ICD-10 Coding Code Description [id number] Non-pressure chronic ulcer of right calf with other specified severity S80.11XD Contusion of right lower leg, subsequent encounter Follow-up Appointments Return Appointment in 1 week. Dressing Change Frequency Do not change entire dressing for one week. Skin Barriers/Peri-Wound Care Wound #1 Right,Lateral Lower Leg Moisturizing lotion - to leg Wound Cleansing May shower with protection. - can use cast protector Primary Wound Dressing Wound #1 Right,Lateral Lower Leg Hydrofera Blue Secondary Dressing Wound #1 Right,Lateral Lower Leg Dry Gauze Edema Control 3 Layer Compression System - Right Lower Extremity Avoid standing for long periods of time Elevate legs to the level of the heart or above for 30 minutes daily and/or when sitting, a frequency of: Exercise regularly Electronic Signature(s) Signed: 03/21/2019 5:55:18 PM By: Worthy Keeler PA-C Signed: 03/21/2019 6:08:21 PM By: Baruch Gouty RN, BSN Entered By: Baruch Gouty on 03/21/2019 17:11:20 -------------------------------------------------------------------------------- Problem List Details Patient Name: Date of Service: [name] 03/21/2019 3:15 PM Medical Record [id number] Patient Account Number: [id number] Date of Birth/Sex: Treating RN: [birth year] (74 y.o. Elam Dutch Primary Care Provider: Jillyn Ledger Other Clinician: Referring Provider: Treating Provider/Extender:Stone III, Lyndee Hensen, [name] in Treatment: 10 Active  Problems ICD-10 Evaluated Encounter Code Description Active Date Today Diagnosis L97.218 Non-pressure chronic ulcer of right calf with other 01/05/2019 No Yes specified severity S80.11XD Contusion of right lower leg, subsequent encounter 01/05/2019 No Yes Inactive Problems Resolved Problems Electronic Signature(s) Signed: 03/21/2019 3:24:45 PM By: Worthy Keeler PA-C Entered By: Worthy Keeler on 03/21/2019 15:24:44 -------------------------------------------------------------------------------- Progress Note Details Patient Name: Date of Service: [name] 03/21/2019 3:15 PM Medical Record [id number] Patient Account Number: [id number] Date of Birth/Sex: Treating RN: 1945-07-30 (74 y.o. Elam Dutch Primary Care Provider: Jillyn Ledger Other Clinician: Referring Provider: Treating Provider/Extender:Stone III, Lyndee Hensen, [name] in Treatment: 10 Subjective Chief Complaint Information obtained from Patient 01/05/2019; patient arrives in clinic for review of contusion on the right lateral calf area History of Present Illness (HPI) ADMISSION 01/05/2019 This is a 74 year old woman with advanced COPD on chronic O2. She managed to strike her lower leg while getting out of a car on Thanksgiving day. She had a wound. The vent they tried to look after themselves at home eventually saw her primary doctor a week later. She was given Keflex and Neosporin. An x-ray was apparently done but I do not have these results. She was seen in the ER last night and given doxycycline this appointment was arranged. The patient has a 3.5 x 2 surface area of black eschar and wound. Swelling inferiorly suggestive of a hematoma. I see no current evidence of infection Past medical history type 2 diabetes recent hemoglobin A1c of 7.6 in April. Chronic COPD the on oxygen. Continued smoker 15 cigarettes/day, hypertension, hyperlipidemia, osteoporosis, schizophrenia, prior amputation  of her right second toe secondary to osteomyelitis in 2018. ABI in this clinic was 1.3 on the right 12/18; the necrotic surface of this hematoma area remained intact which is somewhat surprising. The swelling around the area has  gotten better. She is still having some discomfort managed with as needed tramadol 12/30-Patient returns with dense looking eschar on top of the hematoma on the right lateral leg calf area. Patient is on O2 around-the-clock 02/07/2019 upon evaluation today patient presents for reevaluation in the clinic concerning issues with her right lower extremity ulcer around the calf. She had a hematoma here that was debrided away. She does have a tunnel at 2:00 and overall this seems to be doing okay though there is some sign of infection at this point there is some erythema and warmth to touch around the wound that has me somewhat concerned. Fortunately there is no evidence of active infection systemically which is good news. The patient states in general that she is having some pain although this is not terrible she is okay with me attempting sharp debridement to clear away as much of the necrotic tissue as I can. 02/22/2019 upon evaluation today patient appears to be doing better with regard to her ulcer at this point. She has been tolerating the dressing changes without complication. I do feel like she is making good progress and I am very pleased in this regard. With that being said she is continuing to still have some discomfort but I do not feel like it is as bad as previous. I did review her culture today which revealed Staph epidermidis but I do not see really any need indicate any further antibiotics at this point it did show resistant to tetracycline but at the same time her wound appears to be doing better. I do not believe this is indicative of any type of infection at this point. My suggestion would be that she continue with her antibiotic for the time being to  completion. 02/21/2019 on evaluation today patient appears to be doing well with regard to her leg ulcer. She is showing some signs of good granulation. With that being said unfortunately this is still somewhat slow to heal she still does have some depth. I do believe that potentially a snap VAC could be of benefit for her this was discussed with the patient and her son today as well it was a question they initially brought up about a wound VAC. I believe that it may actually be an excellent option for her and in fact would probably speed up the recovery quite a bit. She is actually very pleased to hear this and would like to proceed if at all possible with that today. 02/28/2019 upon evaluation today patient appears to be showing signs of excellent improvement based on what I am seeing currently. Fortunately there is no signs of active infection and in general I feel like she has been doing extremely well with regard to the snap VAC. In just 1 weeks time again this is excellent and dramatic improvement in my opinion. Overall I am very pleased. I definitely think we can continue as such. 03/07/2019 upon evaluation today patient actually appears to be doing quite well with regard to her wound at this point. She has been tolerating the dressing changes without complication. Fortunately there is no signs of active infection at this time. Overall I am very pleased with the progress. Snap VAC seems to be doing excellent for her. The undermining and tunneling areas of almost completely closed and there is just very tiny areas remaining in 2 spots at this time. I think we are very close to be able to switch to just something such as a Hydrofera Blue dressing. 03/14/19 upon  evaluation today patient appears to be doing very well in regard to her old lateral leg ulcer. She is been tolerating the dressing changes without complication fortunately there's no evidence of active infection at this time. I do believe  we can likely discontinue the Snap Vac as of today. 03/21/2019 upon evaluation today the patient appears to be doing excellent at this point with regard to her leg ulcer. She has been tolerating the dressing changes and overall seems to be faring quite nicely. I am very pleased with the progress she has made and how the appearance of the wound is today. Objective Constitutional Well-nourished and well-hydrated in no acute distress. Vitals Time Taken: 4:15 PM, Height: 62 in, Temperature: 98.5 F, Pulse: 72 bpm, Respiratory Rate: 18 breaths/min, Blood Pressure: 125/52 mmHg, Capillary Blood Glucose: 153 mg/dl. Respiratory normal breathing without difficulty. Psychiatric this patient is able to make decisions and demonstrates good insight into disease process. Alert and Oriented x 3. pleasant and cooperative. General Notes: Patient's wound bed currently showed signs of excellent granulation and epithelization there is no depth to the wound at all and the epithelial tissue seems to be margin across the wound bed quite nicely. I am very pleased and hopeful this will continue to make good progress over the next couple of weeks. Integumentary (Hair, Skin) Wound #1 status is Open. Original cause of wound was Trauma. The wound is located on the Right,Lateral Lower Leg. The wound measures 1.9cm length x 0.8cm width x 0.1cm depth; 1.194cm^2 area and 0.119cm^3 volume. There is Fat Layer (Subcutaneous Tissue) Exposed exposed. There is no tunneling or undermining noted. There is a medium amount of serosanguineous drainage noted. The wound margin is flat and intact. There is large (67-100%) red granulation within the wound bed. There is no necrotic tissue within the wound bed. 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 There was a Three Layer Compression Therapy Procedure by Carlene Coria, RN. Post procedure Diagnosis Wound #1: Same as Pre-Procedure Plan Follow-up Appointments: Return Appointment in 1 week. Dressing Change Frequency: Do not change entire dressing for one week. Skin Barriers/Peri-Wound Care: Wound #1 Right,Lateral Lower Leg: Moisturizing lotion - to leg Wound Cleansing: May shower with protection. - can use cast protector Primary Wound Dressing: Wound #1 Right,Lateral Lower Leg: Hydrofera Blue Secondary Dressing: Wound #1 Right,Lateral Lower Leg: Dry Gauze Edema Control: 3 Layer Compression System - Right Lower Extremity Avoid standing for long periods of time Elevate legs to the level of the heart or above for 30 minutes daily and/or when sitting, a frequency of: Exercise regularly 1. My suggestion at this time is good to be that we go ahead and continue with the [facility] that seems to have done excellent for her I see no reason to make any changes at this point. 2. I am also going to recommend that we continue with the 3 layer compression wrap that seems to be keeping her edema under good control. 3. I am also can recommend that she avoid standing long periods of time though I think she can be physically active. Obviously the less she stands but is able to move around and then elevate her legs when she sits the better her edema will be. We will see patient back for reevaluation in 1 week here in the  clinic. If anything worsens or changes patient will contact our office for additional recommendations. Electronic Signature(s) Signed: 03/21/2019 5:45:03 PM By: Worthy Keeler PA-C Entered By: Worthy Keeler on 03/21/2019 17:45:03 -------------------------------------------------------------------------------- SuperBill Details Patient Name: Date of Service: [name] 03/21/2019 Medical Record [id number] Patient  Account Number: [id number] Date of Birth/Sex: Treating RN: 1945-07-08 (74 y.o. Elam Dutch Primary Care Provider: Jillyn Ledger Other Clinician: Referring Provider: Treating Provider/Extender:Stone III, Lyndee Hensen, [name] in Treatment: 10 Diagnosis Coding ICD-10 Codes Code Description [phone] Non-pressure chronic ulcer of right calf with other specified severity S80.11XD Contusion of right lower leg, subsequent encounter Facility Procedures CPT4 Code Description: [id number] (Facility Use Only) [phone] - APPLY [place] RT LEG Modifier: Quantity: 1 Physician Procedures CPT4 Code Description: [id number] [place] - WC PHYS LEVEL 3 - EST PT ICD-10 Diagnosis Description [id number] Non-pressure chronic ulcer of right calf with other speci S80.11XD Contusion of right lower leg, subsequent encounter Modifier: fied severity Quantity: 1 Electronic Signature(s) Signed: 03/21/2019 5:45:13 PM By: Worthy Keeler PA-C Entered By: Worthy Keeler on 03/21/2019 17:45:13

## 2019-03-26 ENCOUNTER — Ambulatory Visit: Payer: Medicare Other | Attending: Internal Medicine

## 2019-03-26 DIAGNOSIS — Z23 Encounter for immunization: Secondary | ICD-10-CM

## 2019-03-26 NOTE — Progress Notes (Signed)
   Covid-19 Vaccination Clinic  Name:  SHATYRA CRIOLLO    MRN: [id number] DOB: [birth year]  03/26/2019  Ms. Simonian was observed post Covid-19 immunization for 15 minutes without incidence. She was provided with Vaccine Information Sheet and instruction to access the V-Safe system.   Ms. Lebowitz was instructed to call 911 with any severe reactions post vaccine: Marland Kitchen Difficulty breathing  . Swelling of your face and throat  . A fast heartbeat  . A bad rash all over your body  . Dizziness and weakness    Immunizations Administered    Name Date Dose VIS Date Route   Pfizer COVID-19 Vaccine 03/26/2019 12:43 PM 0.3 mL 01/05/2019 Intramuscular   Manufacturer: [place]   Lot: [id number]   Petal: [id number]

## 2019-03-27 NOTE — Progress Notes (Signed)
VAANYA, SHAMBAUGH ([id number]) Visit Report for 03/21/2019 Arrival Information Details Patient Name: Date of Service: YUMNA, EBERS 03/21/2019 3:15 PM Medical Record [id number] Patient Account Number: [id number] Date of Birth/Sex: Treating RN: 31-Mar-1945 (74 y.o. Helene Shoe, Meta.Reding Primary Care : BRAKE, ANDREW Other Clinician: Referring : Treating /Extender:Stone III, Lyndee Hensen, [name] in Treatment: 10 Visit Information History Since Last Visit Added or deleted any medications: No Patient Arrived: Wheel Chair Any new allergies or adverse reactions: No Arrival Time: 16:17 Had a fall or experienced change in No activities of daily living that may affect Accompanied By: son risk of falls: Transfer Assistance: None Signs or symptoms of abuse/neglect since last No Patient Identification Verified: Yes visito Secondary Verification Process Yes Hospitalized since last visit: No Completed: Implantable device outside of the clinic excluding No Patient Requires Transmission-Based No cellular tissue based products placed in the center Precautions: since last visit: Patient Has Alerts: No Has Dressing in Place as Prescribed: Yes Has Compression in Place as Prescribed: Yes Pain Present Now: No Electronic Signature(s) Signed: 03/22/2019 6:07:18 PM By: Deon Pilling Entered By: Deon Pilling on 03/21/2019 16:17:42 -------------------------------------------------------------------------------- Compression Therapy Details Patient Name: Date of Service: ALYDIA, GOSSER 03/21/2019 3:15 PM Medical Record [id number] Patient Account Number: [id number] Date of Birth/Sex: Treating RN: 11/18/1945 (74 y.o. Elam Dutch Primary Care : Jillyn Ledger Other Clinician: Referring : Treating /Extender:Stone III, Lyndee Hensen, [name] in Treatment: 10 Compression Therapy Performed for Wound Wound #1 Right,Lateral Lower  Leg Assessment: Performed By: Clinician Carlene Coria, RN Compression Type: Three Layer Post Procedure Diagnosis Same as Pre-procedure Electronic Signature(s) Signed: 03/21/2019 6:08:21 PM By: Baruch Gouty RN, BSN Entered By: Baruch Gouty on 03/21/2019 17:10:51 -------------------------------------------------------------------------------- Encounter Discharge Information Details Patient Name: Date of Service: [name] 03/21/2019 3:15 PM Medical Record [id number] Patient Account Number: [id number] Date of Birth/Sex: Treating RN: 09-11-1945 (74 y.o. [name] Primary Care : Jillyn Ledger Other Clinician: Referring : Treating /Extender:Stone III, Lyndee Hensen, [name] in Treatment: 10 Encounter Discharge Information Items Discharge Condition: Stable Ambulatory Status: Wheelchair Discharge Destination: Home Transportation: Private Auto Accompanied By: son Schedule Follow-up Appointment: Yes Clinical Summary of Care: Patient Declined Electronic Signature(s) Signed: 03/27/2019 9:14:02 AM By: Carlene Coria RN Entered By: Carlene Coria on 03/21/2019 17:35:39 -------------------------------------------------------------------------------- Lower Extremity Assessment Details Patient Name: Date of Service: JATZIRI, GOFFREDO 03/21/2019 3:15 PM Medical Record [id number] Patient Account Number: [id number] Date of Birth/Sex: Treating RN: 09/02/1945 (74 y.o. [name] Primary Care : BRAKE, ANDREW Other Clinician: Referring : Treating /Extender:Stone III, Lyndee Hensen, [name] in Treatment: 10 Edema Assessment Assessed: [Left: No] [Right: Yes] Edema: [Left: Ye] [Right: s] Calf Left: Right: Point of Measurement: 28 cm From Medial Instep cm 37.5 cm Ankle Left: Right: Point of Measurement: 10 cm From Medial Instep cm 21.5 cm Vascular Assessment Pulses: Dorsalis Pedis Palpable:  [Right:Yes] Electronic Signature(s) Signed: 03/22/2019 6:07:18 PM By: Deon Pilling Entered By: Deon Pilling on 03/21/2019 16:23:35 -------------------------------------------------------------------------------- Multi-Disciplinary Care Plan Details Patient Name: Date of Service: [name] 03/21/2019 3:15 PM Medical Record [id number] Patient Account Number: [id number] Date of Birth/Sex: Treating RN: 08/21/1945 (74 y.o. Elam Dutch Primary Care : Jillyn Ledger Other Clinician: Referring : Treating /Extender:Stone III, Lyndee Hensen, [name] in Treatment: 10 Active Inactive Venous Leg Ulcer Nursing Diagnoses: Potential for venous Insuffiency (use before diagnosis confirmed) Goals: Patient will maintain optimal edema control Date Initiated: 02/07/2019 Target Resolution Date: 04/04/2019 Goal Status: Active Interventions: Assess peripheral edema status every visit.  Compression as ordered Provide education on venous insufficiency Treatment Activities: Therapeutic compression applied : 02/07/2019 Notes: Wound/Skin Impairment Nursing Diagnoses: Impaired tissue integrity Goals: Patient/caregiver will verbalize understanding of skin care regimen Date Initiated: 02/07/2019 Target Resolution Date: 04/04/2019 Goal Status: Active Ulcer/skin breakdown will have a volume reduction of 30% by week 4 Date Initiated: 01/05/2019 Date Inactivated: 02/07/2019 Target Resolution Date: 02/02/2019 Unmet Reason: hematoma Goal Status: Unmet unroofed Ulcer/skin breakdown will have a volume reduction of 50% by week 8 Date Initiated: 02/07/2019 Date Inactivated: 03/07/2019 Target Resolution Date: 03/07/2019 Goal Status: Met Ulcer/skin breakdown will have a volume reduction of 80% by week 12 Date Initiated: 03/07/2019 Target Resolution Date: 04/04/2019 Goal Status: Active Interventions: Provide education on smoking Provide education on ulcer and skin  care Notes: Electronic Signature(s) Signed: 03/21/2019 6:08:21 PM By: Baruch Gouty RN, BSN Entered By: Baruch Gouty on 03/21/2019 17:09:58 -------------------------------------------------------------------------------- Pain Assessment Details Patient Name: Date of Service: [name] 03/21/2019 3:15 PM Medical Record [id number] Patient Account Number: [id number] Date of Birth/Sex: Treating RN: 1945/03/26 (75 y.o. [name] Primary Care : Jillyn Ledger Other Clinician: Referring : Treating /Extender:Stone III, Lyndee Hensen, [name] in Treatment: 10 Active Problems Location of Pain Severity and Description of Pain Patient Has Paino No Site Locations Rate the pain. Current Pain Level: 0 Pain Management and Medication Current Pain Management: Medication: No Cold Application: No Rest: No Massage: No Activity: No T.E.N.S.: No Heat Application: No Leg drop or elevation: No Is the Current Pain Management Adequate: Adequate How does your wound impact your activities of daily livingo Sleep: No Bathing: No Appetite: No Relationship With Others: No Bladder Continence: No Emotions: No Bowel Continence: No Work: No Toileting: No Drive: No Dressing: No Hobbies: No Electronic Signature(s) Signed: 03/22/2019 6:07:18 PM By: Deon Pilling Entered By: Deon Pilling on 03/21/2019 16:17:53 -------------------------------------------------------------------------------- Patient/Caregiver Education Details Patient Name: Date of Service: [name] 2/24/2021andnbsp3:15 PM Medical Record [id number] Patient Account Number: [id number] Date of Birth/Gender: Treating RN: [birth year] (74 y.o. Elam Dutch Primary Care Physician: Jillyn Ledger Other Clinician: Referring Physician: Treating Physician/Extender:Stone III, Lyndee Hensen, [name] in Treatment: 10 Education Assessment Education Provided  To: Patient Education Topics Provided Venous: Methods: Explain/Verbal Responses: Reinforcements needed, State content correctly Wound/Skin Impairment: Methods: Explain/Verbal Responses: Reinforcements needed, State content correctly Electronic Signature(s) Signed: 03/21/2019 6:08:21 PM By: Baruch Gouty RN, BSN Entered By: Baruch Gouty on 03/21/2019 17:10:18 -------------------------------------------------------------------------------- Wound Assessment Details Patient Name: Date of Service: [name] 03/21/2019 3:15 PM Medical Record [id number] Patient Account Number: [id number] Date of Birth/Sex: Treating RN: August 16, 1945 (74 y.o. Elam Dutch Primary Care : BRAKE, Mitzi Hansen Other Clinician: Referring : Treating /Extender:Stone III, Lyndee Hensen, [name] in Treatment: 10 Wound Status Wound Number: 1 Primary Venous Leg Ulcer Etiology: Wound Location: Right Lower Leg - Lateral Wound Open Wounding Event: Trauma Status: Date Acquired: 12/21/2018 Comorbid Glaucoma, Chronic Obstructive Pulmonary Weeks Of Treatment: 10 History: Disease (COPD), Hypertension, Type II Clustered Wound: No Diabetes, Osteoarthritis, Osteomyelitis, Neuropathy, Confinement Anxiety Photos Wound Measurements Length: (cm) 1.9 % Reduct Width: (cm) 0.8 % Reduct Depth: (cm) 0.1 Epitheli Area: (cm) 1.194 Tunneli Volume: (cm) 0.119 Undermi Wound Description Classification: Full Thickness Without Exposed Support Foul Od Structures Slough/ Wound Flat and Intact Margin: Exudate Exudate Medium Amount: Exudate Serosanguineous Type: Exudate red, brown Color: Wound Bed Granulation Amount: Large (67-100%) Granulation Quality: Red Fascia Expo Necrotic Amount: None Present (0%) Fat Layer ( Tendon Expo Muscle Expo Joint Expos Bone Expose or After Cleansing: No Fibrino No  Exposed Structure sed: No Subcutaneous Tissue) Exposed: Yes sed: No sed:  No ed: No d: No ion in Area: 78.3% ion in Volume: 78.4% alization: Large (67-100%) ng: No ning: No Treatment Notes Wound #1 (Right, Lateral Lower Leg) 1. Cleanse With Wound Cleanser Soap and water 3. Primary Dressing Applied Hydrofera Blue 4. Secondary Dressing Dry Gauze 6. Support Layer Applied 3 layer compression wrap Notes netting. Electronic Signature(s) Signed: 03/22/2019 5:25:06 PM By: Mikeal Hawthorne EMT/HBOT Signed: 03/22/2019 5:28:31 PM By: Baruch Gouty RN, BSN Entered By: Mikeal Hawthorne on 03/22/2019 15:18:45 -------------------------------------------------------------------------------- Vitals Details Patient Name: Date of Service: [name] 03/21/2019 3:15 PM Medical Record [id number] Patient Account Number: [id number] Date of Birth/Sex: Treating RN: 01-06-1946 (74 y.o. [name] Primary Care : BRAKE, ANDREW Other Clinician: Referring : Treating /Extender:Stone III, Lyndee Hensen, [name] in Treatment: 10 Vital Signs Time Taken: 16:15 Temperature (F): 98.5 Height (in): 62 Pulse (bpm): 72 Respiratory Rate (breaths/min): 18 Blood Pressure (mmHg): 125/52 Capillary Blood Glucose (mg/dl): 153 Reference Range: 80 - 120 mg / dl Electronic Signature(s) Signed: 03/22/2019 6:07:18 PM By: Deon Pilling Entered By: Deon Pilling on 03/21/2019 16:19:15

## 2019-03-28 ENCOUNTER — Other Ambulatory Visit: Payer: Self-pay

## 2019-03-28 ENCOUNTER — Encounter (HOSPITAL_BASED_OUTPATIENT_CLINIC_OR_DEPARTMENT_OTHER): Payer: Medicare Other | Attending: Physician Assistant | Admitting: Physician Assistant

## 2019-03-28 DIAGNOSIS — J449 Chronic obstructive pulmonary disease, unspecified: Secondary | ICD-10-CM | POA: Diagnosis not present

## 2019-03-28 DIAGNOSIS — I872 Venous insufficiency (chronic) (peripheral): Secondary | ICD-10-CM | POA: Diagnosis not present

## 2019-03-28 DIAGNOSIS — L97218 Non-pressure chronic ulcer of right calf with other specified severity: Secondary | ICD-10-CM | POA: Insufficient documentation

## 2019-03-28 DIAGNOSIS — W228XXA Striking against or struck by other objects, initial encounter: Secondary | ICD-10-CM | POA: Diagnosis not present

## 2019-03-28 DIAGNOSIS — Z9981 Dependence on supplemental oxygen: Secondary | ICD-10-CM | POA: Diagnosis not present

## 2019-03-28 DIAGNOSIS — F17210 Nicotine dependence, cigarettes, uncomplicated: Secondary | ICD-10-CM | POA: Diagnosis not present

## 2019-03-28 DIAGNOSIS — E11622 Type 2 diabetes mellitus with other skin ulcer: Secondary | ICD-10-CM | POA: Diagnosis not present

## 2019-03-28 DIAGNOSIS — S8011XA Contusion of right lower leg, initial encounter: Secondary | ICD-10-CM | POA: Insufficient documentation

## 2019-03-28 DIAGNOSIS — Z89421 Acquired absence of other right toe(s): Secondary | ICD-10-CM | POA: Insufficient documentation

## 2019-03-28 DIAGNOSIS — L97212 Non-pressure chronic ulcer of right calf with fat layer exposed: Secondary | ICD-10-CM | POA: Diagnosis not present

## 2019-03-28 NOTE — Progress Notes (Addendum)
[name] ([id number]) Visit Report for 03/28/2019 Chief Complaint Document Details Patient Name: Date of Service: [name] 03/28/2019 3:15 PM Medical Record [id number] Patient Account Number: [id number] Date of Birth/Sex: Treating RN: 30-Nov-1945 (74 y.o. Nancy Fetter Primary Care Provider: Jillyn Ledger Other Clinician: Referring Provider: Treating Provider/Extender:Stone III, Lyndee Hensen, [name] in Treatment: 11 Information Obtained from: Patient Chief Complaint 01/05/2019; patient arrives in clinic for review of contusion on the right lateral calf area Electronic Signature(s) Signed: 03/28/2019 3:34:10 PM By: Worthy Keeler PA-C Entered By: Worthy Keeler on 03/28/2019 15:34:10 -------------------------------------------------------------------------------- HPI Details Patient Name: Date of Service: [name] 03/28/2019 3:15 PM Medical Record [id number] Patient Account Number: [id number] Date of Birth/Sex: Treating RN: Dec 28, 1945 (74 y.o. Nancy Fetter Primary Care Provider: Jillyn Ledger Other Clinician: Referring Provider: Treating Provider/Extender:Stone III, Lyndee Hensen, [name] in Treatment: 11 History of Present Illness HPI Description: ADMISSION 01/05/2019 This is a 74 year old woman with advanced COPD on chronic O2. She managed to strike her lower leg while getting out of a car on Thanksgiving day. She had a wound. The vent they tried to look after themselves at home eventually saw her primary doctor a week later. She was given Keflex and Neosporin. An x-ray was apparently done but I do not have these results. She was seen in the ER last night and given doxycycline this appointment was arranged. The patient has a 3.5 x 2 surface area of black eschar and wound. Swelling inferiorly suggestive of a hematoma. I see no current evidence of infection Past medical history type 2 diabetes recent hemoglobin A1c of 7.6 in  April. Chronic COPD the on oxygen. 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 ABI in this clinic was 1.3 on the right 12/18; the necrotic surface of this hematoma area remained intact which is somewhat surprising. The swelling around the area has gotten better. She is still having some discomfort managed with as needed tramadol 12/30-Patient returns with dense looking eschar on top of the hematoma on the right lateral leg calf area. Patient is on O2 around-the-clock 02/07/2019 upon evaluation today patient presents for reevaluation in the clinic concerning issues with her right lower extremity ulcer around the calf. She had a hematoma here that was debrided away. She does have a tunnel at 2:00 and overall this seems to be doing okay though there is some sign of infection at this point there is some erythema and warmth to touch around the wound that has me somewhat concerned. Fortunately there is no evidence of active infection systemically which is good news. The patient states in general that she is having some pain although this is not terrible she is okay with me attempting sharp debridement to clear away as much of the necrotic tissue as I can. 02/22/2019 upon evaluation today patient appears to be doing better with regard to her ulcer at this point. She has been tolerating the dressing changes without complication. I do feel like she is making good progress and I am very pleased in this regard. With that being said she is continuing to still have some discomfort but I do not feel like it is as bad as previous. I did review her culture today which revealed Staph epidermidis but I do not see really any need indicate any further antibiotics at this point it did show resistant to tetracycline but at the same time her wound appears  to be doing better. I do not believe  this is indicative of any type of infection at this point. My suggestion would be that she continue with her antibiotic for the time being to completion. 02/21/2019 on evaluation today patient appears to be doing well with regard to her leg ulcer. She is showing some signs of good granulation. With that being said unfortunately this is still somewhat slow to heal she still does have some depth. I do believe that potentially a snap VAC could be of benefit for her this was discussed with the patient and her son today as well it was a question they initially brought up about a wound VAC. I believe that it may actually be an excellent option for her and in fact would probably speed up the recovery quite a bit. She is actually very pleased to hear this and would like to proceed if at all possible with that today. 02/28/2019 upon evaluation today patient appears to be showing signs of excellent improvement based on what I am seeing currently. Fortunately there is no signs of active infection and in general I feel like she has been doing extremely well with regard to the snap VAC. In just 1 weeks time again this is excellent and dramatic improvement in my opinion. Overall I am very pleased. I definitely think we can continue as such. 03/07/2019 upon evaluation today patient actually appears to be doing quite well with regard to her wound at this point. She has been tolerating the dressing changes without complication. Fortunately there is no signs of active infection at this time. Overall I am very pleased with the progress. Snap VAC seems to be doing excellent for her. The undermining and tunneling areas of almost completely closed and there is just very tiny areas remaining in 2 spots at this time. I think we are very close to be able to switch to just something such as a Hydrofera Blue dressing. 03/14/19 upon evaluation today patient appears to be doing very well in regard to her old lateral leg ulcer. She  is been tolerating the dressing changes without complication fortunately there's no evidence of active infection at this time. I do believe we can likely discontinue the Snap Vac as of today. 03/21/2019 upon evaluation today the patient appears to be doing excellent at this point with regard to her leg ulcer. She has been tolerating the dressing changes and overall seems to be faring quite nicely. I am very pleased with the progress she has made and how the appearance of the wound is today. 03/28/2019 on evaluation today patient appears to be doing well with regard to her wound. Unfortunately the [facility] is getting kind of stuck to the wound bed which is causing her some issues here. Fortunately there is no evidence of active infection which is good news. No fevers, chills, nausea, vomiting, or diarrhea. I do think we may need to use a contact layer between the [facility] and the wound in order to help with preventing any sticking severely to the wound which is causing some trauma around the periwound. Electronic Signature(s) Signed: 03/28/2019 4:17:07 PM By: Worthy Keeler PA-C Entered By: Worthy Keeler on 03/28/2019 16:17:07 -------------------------------------------------------------------------------- Physical Exam Details Patient Name: Date of Service: [name], [name] 03/28/2019 3:15 PM Medical Record [id number] Patient Account Number: [id number] Date of Birth/Sex: Treating RN: 1945-02-21 (74 y.o. Nancy Fetter Primary Care Provider: Jillyn Ledger Other Clinician: Referring Provider: Treating Provider/Extender:Stone III, Lyndee Hensen, [name]  in Treatment: 59 Constitutional Well-nourished and well-hydrated in no acute distress. Respiratory normal breathing without difficulty. Psychiatric this patient is able to make decisions and demonstrates good insight into disease process. Alert and Oriented x 3. pleasant and cooperative. Notes Patient's wound  currently showed signs of good granulation at this time. Fortunately there is no evidence of active infection which is good news. No fevers, chills, nausea, vomiting, or diarrhea. Electronic Signature(s) Signed: 03/28/2019 4:17:28 PM By: Worthy Keeler PA-C Entered By: Worthy Keeler on 03/28/2019 16:17:27 -------------------------------------------------------------------------------- Physician Orders Details Patient Name: Date of Service: [name] 03/28/2019 3:15 PM Medical Record [id number] Patient Account Number: [id number] Date of Birth/Sex: Treating RN: 08-07-1945 (74 y.o. Nancy Fetter Primary Care Provider: Jillyn Ledger Other Clinician: Referring Provider: Treating Provider/Extender:Stone III, Lyndee Hensen, [name] in Treatment: 11 Verbal / Phone Orders: No Diagnosis Coding ICD-10 Coding Code Description [id number] Non-pressure chronic ulcer of right calf with other specified severity S80.11XD Contusion of right lower leg, subsequent encounter Follow-up Appointments Return Appointment in 1 week. Dressing Change Frequency Wound #1 Right,Lateral Lower Leg Do not change entire dressing for one week. Skin Barriers/Peri-Wound Care Wound #1 Right,Lateral Lower Leg Moisturizing lotion - to leg Wound Cleansing May shower with protection. - can use cast protector Primary Wound Dressing Wound #1 Right,Lateral Lower Leg Hydrofera Blue Mepitel or Adaptic - under Hydrofera to prevent from sticking Secondary Dressing Wound #1 Right,Lateral Lower Leg Dry Gauze Edema Control 3 Layer Compression System - Right Lower Extremity Avoid standing for long periods of time Elevate legs to the level of the heart or above for 30 minutes daily and/or when sitting, a frequency of: Exercise regularly Electronic Signature(s) Signed: 03/28/2019 4:54:37 PM By: Worthy Keeler PA-C Signed: 03/28/2019 5:54:02 PM By: Levan Hurst RN, BSN Entered By: Levan Hurst on 03/28/2019  16:13:17 -------------------------------------------------------------------------------- Problem List Details Patient Name: Date of Service: [name] 03/28/2019 3:15 PM Medical Record [id number] Patient Account Number: [id number] Date of Birth/Sex: Treating RN: [birth year] (74 y.o. Nancy Fetter Primary Care Provider: Jillyn Ledger Other Clinician: Referring Provider: Treating Provider/Extender:Stone III, Lyndee Hensen, [name] in Treatment: 11 Active Problems ICD-10 Evaluated Encounter Code Description Active Date Today Diagnosis L97.218 Non-pressure chronic ulcer of right calf with other 01/05/2019 No Yes specified severity S80.11XD Contusion of right lower leg, subsequent encounter 01/05/2019 No Yes Inactive Problems Resolved Problems Electronic Signature(s) Signed: 03/28/2019 3:34:04 PM By: Worthy Keeler PA-C Entered By: Worthy Keeler on 03/28/2019 15:34:04 -------------------------------------------------------------------------------- Progress Note Details Patient Name: Date of Service: [name] 03/28/2019 3:15 PM Medical Record [id number] Patient Account Number: [id number] Date of Birth/Sex: Treating RN: [birth year] (74 y.o. Nancy Fetter Primary Care Provider: Jillyn Ledger Other Clinician: Referring Provider: Treating Provider/Extender:Stone III, Lyndee Hensen, [name] in Treatment: 11 Subjective Chief Complaint Information obtained from Patient 01/05/2019; patient arrives in clinic for review of contusion on the right lateral calf area History of Present Illness (HPI) ADMISSION 01/05/2019 This is a 74 year old woman with advanced COPD on chronic O2. She managed to strike her lower leg while getting out of a car on Thanksgiving day. She had a wound. The vent they tried to look after themselves at home eventually saw her primary doctor a week later. She was given Keflex and Neosporin. An x-ray was apparently done but I  do not have these results. She was seen in the ER last night and given doxycycline this appointment was arranged. The patient has a 3.5 x 2 surface area of  black eschar and wound. Swelling inferiorly suggestive of a hematoma. I see no current evidence of infection Past medical history type 2 diabetes recent hemoglobin A1c of 7.6 in April. Chronic COPD the on oxygen. 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 ABI in this clinic was 1.3 on the right 12/18; the necrotic surface of this hematoma area remained intact which is somewhat surprising. The swelling around the area has gotten better. She is still having some discomfort managed with as needed tramadol 12/30-Patient returns with dense looking eschar on top of the hematoma on the right lateral leg calf area. Patient is on O2 around-the-clock 02/07/2019 upon evaluation today patient presents for reevaluation in the clinic concerning issues with her right lower extremity ulcer around the calf. She had a hematoma here that was debrided away. She does have a tunnel at 2:00 and overall this seems to be doing okay though there is some sign of infection at this point there is some erythema and warmth to touch around the wound that has me somewhat concerned. Fortunately there is no evidence of active infection systemically which is good news. The patient states in general that she is having some pain although this is not terrible she is okay with me attempting sharp debridement to clear away as much of the necrotic tissue as I can. 02/22/2019 upon evaluation today patient appears to be doing better with regard to her ulcer at this point. She has been tolerating the dressing changes without complication. I do feel like she is making good progress and I am very pleased in this regard. With that being said she is continuing to  still have some discomfort but I do not feel like it is as bad as previous. I did review her culture today which revealed Staph epidermidis but I do not see really any need indicate any further antibiotics at this point it did show resistant to tetracycline but at the same time her wound appears to be doing better. I do not believe this is indicative of any type of infection at this point. My suggestion would be that she continue with her antibiotic for the time being to completion. 02/21/2019 on evaluation today patient appears to be doing well with regard to her leg ulcer. She is showing some signs of good granulation. With that being said unfortunately this is still somewhat slow to heal she still does have some depth. I do believe that potentially a snap VAC could be of benefit for her this was discussed with the patient and her son today as well it was a question they initially brought up about a wound VAC. I believe that it may actually be an excellent option for her and in fact would probably speed up the recovery quite a bit. She is actually very pleased to hear this and would like to proceed if at all possible with that today. 02/28/2019 upon evaluation today patient appears to be showing signs of excellent improvement based on what I am seeing currently. Fortunately there is no signs of active infection and in general I feel like she has been doing extremely well with regard to the snap VAC. In just 1 weeks time again this is excellent and dramatic improvement in my opinion. Overall I am very pleased. I definitely think we can continue as such. 03/07/2019 upon evaluation today patient actually appears to be doing quite well with regard to her wound at  this point. She has been tolerating the dressing changes without complication. Fortunately there is no signs of active infection at this time. Overall I am very pleased with the progress. Snap VAC seems to be doing excellent for her. The  undermining and tunneling areas of almost completely closed and there is just very tiny areas remaining in 2 spots at this time. I think we are very close to be able to switch to just something such as a Hydrofera Blue dressing. 03/14/19 upon evaluation today patient appears to be doing very well in regard to her old lateral leg ulcer. She is been tolerating the dressing changes without complication fortunately there's no evidence of active infection at this time. I do believe we can likely discontinue the Snap Vac as of today. 03/21/2019 upon evaluation today the patient appears to be doing excellent at this point with regard to her leg ulcer. She has been tolerating the dressing changes and overall seems to be faring quite nicely. I am very pleased with the progress she has made and how the appearance of the wound is today. 03/28/2019 on evaluation today patient appears to be doing well with regard to her wound. Unfortunately the [facility] is getting kind of stuck to the wound bed which is causing her some issues here. Fortunately there is no evidence of active infection which is good news. No fevers, chills, nausea, vomiting, or diarrhea. I do think we may need to use a contact layer between the [facility] and the wound in order to help with preventing any sticking severely to the wound which is causing some trauma around the periwound. Objective Constitutional Well-nourished and well-hydrated in no acute distress. Vitals Time Taken: 3:46 PM, Height: 62 in, Temperature: 98.7 F, Pulse: 71 bpm, Respiratory Rate: 18 breaths/min, Blood Pressure: 106/42 mmHg, Capillary Blood Glucose: 310 mg/dl. Respiratory normal breathing without difficulty. Psychiatric this patient is able to make decisions and demonstrates good insight into disease process. Alert and Oriented x 3. pleasant and cooperative. General Notes: Patient's wound currently showed signs of good granulation at this time.  Fortunately there is no evidence of active infection which is good news. No fevers, chills, nausea, vomiting, or diarrhea. Integumentary (Hair, Skin) Wound #1 status is Open. Original cause of wound was Trauma. The wound is located on the Right,Lateral Lower Leg. The wound measures 1.5cm length x 0.7cm width x 0.1cm depth; 0.825cm^2 area and 0.082cm^3 volume. There is Fat Layer (Subcutaneous Tissue) Exposed exposed. There is no tunneling or undermining noted. There is a small amount of serosanguineous drainage noted. The wound margin is flat and intact. There is large (67-100%) red granulation within the wound bed. There is a small (1-33%) amount of necrotic tissue within the wound bed including Adherent Slough. 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 There was a Three Layer Compression Therapy Procedure by Levan Hurst, RN. Post procedure Diagnosis Wound #1: Same as Pre-Procedure Plan Follow-up Appointments: Return Appointment in 1 week. Dressing Change Frequency: Wound #1 Right,Lateral Lower Leg: Do not change entire dressing for one week. Skin Barriers/Peri-Wound Care: Wound #1 Right,Lateral Lower Leg: Moisturizing lotion - to leg Wound Cleansing: May shower with protection. - can use cast protector Primary Wound Dressing: Wound #1 Right,Lateral Lower Leg: Hydrofera Blue Mepitel or Adaptic - under Hydrofera to prevent  from sticking Secondary Dressing: Wound #1 Right,Lateral Lower Leg: Dry Gauze Edema Control: 3 Layer Compression System - Right Lower Extremity Avoid standing for long periods of time Elevate legs to the level of the heart or above for 30 minutes daily and/or when sitting, a frequency of: Exercise regularly 1. My suggestion  at this time is given be that we go ahead and continue with the [facility] we will use a contact layer, Adaptic, between the [facility] and the patient's skin. 2. I would recommend a 3 layer compression wrap be continued as well. 3. Also recommend the patient continue to elevate her legs is much as possible. We will see patient back for reevaluation in 1 week here in the clinic. If anything worsens or changes patient will contact our office for additional recommendations. Electronic Signature(s) Signed: 03/28/2019 4:17:58 PM By: Worthy Keeler PA-C Entered By: Worthy Keeler on 03/28/2019 16:17:57 -------------------------------------------------------------------------------- SuperBill Details Patient Name: Date of Service: [name] 03/28/2019 Medical Record [id number] Patient Account Number: [id number] Date of Birth/Sex: Treating RN: [birth year] (74 y.o. Nancy Fetter Primary Care Provider: Jillyn Ledger Other Clinician: Referring Provider: Treating Provider/Extender:Stone III, Lyndee Hensen, [name] in Treatment: 11 Diagnosis Coding ICD-10 Codes Code Description [id number] Non-pressure chronic ulcer of right calf with other specified severity S80.11XD Contusion of right lower leg, subsequent encounter Facility Procedures CPT4 Code Description: [id number] (Facility Use Only) [phone] - [place] LEG Modifier: Quantity: 1 Physician Procedures Electronic Signature(s) Signed: 03/28/2019 4:18:06 PM By: Worthy Keeler PA-C Entered By: Worthy Keeler on 03/28/2019 16:18:06

## 2019-04-02 DIAGNOSIS — I420 Dilated cardiomyopathy: Secondary | ICD-10-CM | POA: Diagnosis not present

## 2019-04-02 DIAGNOSIS — Z148 Genetic carrier of other disease: Secondary | ICD-10-CM | POA: Diagnosis not present

## 2019-04-02 DIAGNOSIS — Z8249 Family history of ischemic heart disease and other diseases of the circulatory system: Secondary | ICD-10-CM | POA: Diagnosis not present

## 2019-04-02 DIAGNOSIS — I499 Cardiac arrhythmia, unspecified: Secondary | ICD-10-CM | POA: Diagnosis not present

## 2019-04-02 DIAGNOSIS — Z1589 Genetic susceptibility to other disease: Secondary | ICD-10-CM | POA: Diagnosis not present

## 2019-04-02 DIAGNOSIS — I10 Essential (primary) hypertension: Secondary | ICD-10-CM | POA: Diagnosis not present

## 2019-04-02 DIAGNOSIS — Z8481 Family history of carrier of genetic disease: Secondary | ICD-10-CM | POA: Diagnosis not present

## 2019-04-02 DIAGNOSIS — E7800 Pure hypercholesterolemia, unspecified: Secondary | ICD-10-CM | POA: Diagnosis not present

## 2019-04-04 ENCOUNTER — Encounter (HOSPITAL_BASED_OUTPATIENT_CLINIC_OR_DEPARTMENT_OTHER): Payer: Medicare Other | Admitting: Physician Assistant

## 2019-04-04 ENCOUNTER — Other Ambulatory Visit: Payer: Self-pay

## 2019-04-04 DIAGNOSIS — Z9981 Dependence on supplemental oxygen: Secondary | ICD-10-CM | POA: Diagnosis not present

## 2019-04-04 DIAGNOSIS — L97219 Non-pressure chronic ulcer of right calf with unspecified severity: Secondary | ICD-10-CM | POA: Diagnosis not present

## 2019-04-04 DIAGNOSIS — E11622 Type 2 diabetes mellitus with other skin ulcer: Secondary | ICD-10-CM | POA: Diagnosis not present

## 2019-04-04 DIAGNOSIS — I872 Venous insufficiency (chronic) (peripheral): Secondary | ICD-10-CM | POA: Diagnosis not present

## 2019-04-04 DIAGNOSIS — S8011XA Contusion of right lower leg, initial encounter: Secondary | ICD-10-CM | POA: Diagnosis not present

## 2019-04-04 DIAGNOSIS — J449 Chronic obstructive pulmonary disease, unspecified: Secondary | ICD-10-CM | POA: Diagnosis not present

## 2019-04-04 DIAGNOSIS — F17210 Nicotine dependence, cigarettes, uncomplicated: Secondary | ICD-10-CM | POA: Diagnosis not present

## 2019-04-04 DIAGNOSIS — L97218 Non-pressure chronic ulcer of right calf with other specified severity: Secondary | ICD-10-CM | POA: Diagnosis not present

## 2019-04-04 NOTE — Progress Notes (Addendum)
[name], [name] ([id number]) Visit Report for 04/04/2019 Chief Complaint Document Details Patient Name: Date of Service: [name] 04/04/2019 3:00 PM Medical Record [id number] Patient Account Number: [id number] Date of Birth/Sex: Treating RN: May 22, 1945 (74 y.o. [name] Primary Care Provider: Jillyn Ledger Other Clinician: Referring Provider: Treating Provider/Extender:Stone III, Lyndee Hensen, [name] in Treatment: 12 Information Obtained from: Patient Chief Complaint 01/05/2019; patient arrives in clinic for review of contusion on the right lateral calf area Electronic Signature(s) Signed: 04/04/2019 3:47:34 PM By: Worthy Keeler PA-C Entered By: Worthy Keeler on 04/04/2019 15:47:33 -------------------------------------------------------------------------------- HPI Details Patient Name: Date of Service: [name] 04/04/2019 3:00 PM Medical Record [id number] Patient Account Number: [id number] Date of Birth/Sex: Treating RN: December 23, 1945 (74 y.o. [name] Primary Care Provider: Jillyn Ledger Other Clinician: Referring Provider: Treating Provider/Extender:Stone III, Lyndee Hensen, [name] in Treatment: 12 History of Present Illness HPI Description: ADMISSION 01/05/2019 This is a 74 year old woman with advanced COPD on chronic O2. She managed to strike her lower leg while getting out of a car on Thanksgiving day. She had a wound. The vent they tried to look after themselves at home eventually saw her primary doctor a week later. She was given Keflex and Neosporin. An x-ray was apparently done but I do not have these results. She was seen in the ER last night and given doxycycline this appointment was arranged. The patient has a 3.5 x 2 surface area of black eschar and wound. Swelling inferiorly suggestive of a hematoma. I see no current evidence of infection Past medical history type 2 diabetes recent hemoglobin A1c of 7.6  in April. Chronic COPD the on oxygen. 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 ABI in this clinic was 1.3 on the right 12/18; the necrotic surface of this hematoma area remained intact which is somewhat surprising. The swelling around the area has gotten better. She is still having some discomfort managed with as needed tramadol 12/30-Patient returns with dense looking eschar on top of the hematoma on the right lateral leg calf area. Patient is on O2 around-the-clock 02/07/2019 upon evaluation today patient presents for reevaluation in the clinic concerning issues with her right lower extremity ulcer around the calf. She had a hematoma here that was debrided away. She does have a tunnel at 2:00 and overall this seems to be doing okay though there is some sign of infection at this point there is some erythema and warmth to touch around the wound that has me somewhat concerned. Fortunately there is no evidence of active infection systemically which is good news. The patient states in general that she is having some pain although this is not terrible she is okay with me attempting sharp debridement to clear away as much of the necrotic tissue as I can. 02/22/2019 upon evaluation today patient appears to be doing better with regard to her ulcer at this point. She has been tolerating the dressing changes without complication. I do feel like she is making good progress and I am very pleased in this regard. With that being said she is continuing to still have some discomfort but I do not feel like it is as bad as previous. I did review her culture today which revealed Staph epidermidis but I do not see really any need indicate any further antibiotics at this point it did show resistant to tetracycline but at the same time her wound appears  to be doing better. I do not believe  this is indicative of any type of infection at this point. My suggestion would be that she continue with her antibiotic for the time being to completion. 02/21/2019 on evaluation today patient appears to be doing well with regard to her leg ulcer. She is showing some signs of good granulation. With that being said unfortunately this is still somewhat slow to heal she still does have some depth. I do believe that potentially a snap VAC could be of benefit for her this was discussed with the patient and her son today as well it was a question they initially brought up about a wound VAC. I believe that it may actually be an excellent option for her and in fact would probably speed up the recovery quite a bit. She is actually very pleased to hear this and would like to proceed if at all possible with that today. 02/28/2019 upon evaluation today patient appears to be showing signs of excellent improvement based on what I am seeing currently. Fortunately there is no signs of active infection and in general I feel like she has been doing extremely well with regard to the snap VAC. In just 1 weeks time again this is excellent and dramatic improvement in my opinion. Overall I am very pleased. I definitely think we can continue as such. 03/07/2019 upon evaluation today patient actually appears to be doing quite well with regard to her wound at this point. She has been tolerating the dressing changes without complication. Fortunately there is no signs of active infection at this time. Overall I am very pleased with the progress. Snap VAC seems to be doing excellent for her. The undermining and tunneling areas of almost completely closed and there is just very tiny areas remaining in 2 spots at this time. I think we are very close to be able to switch to just something such as a Hydrofera Blue dressing. 03/14/19 upon evaluation today patient appears to be doing very well in regard to her old lateral leg ulcer. She  is been tolerating the dressing changes without complication fortunately there's no evidence of active infection at this time. I do believe we can likely discontinue the Snap Vac as of today. 03/21/2019 upon evaluation today the patient appears to be doing excellent at this point with regard to her leg ulcer. She has been tolerating the dressing changes and overall seems to be faring quite nicely. I am very pleased with the progress she has made and how the appearance of the wound is today. 03/28/2019 on evaluation today patient appears to be doing well with regard to her wound. Unfortunately the [facility] is getting kind of stuck to the wound bed which is causing her some issues here. Fortunately there is no evidence of active infection which is good news. No fevers, chills, nausea, vomiting, or diarrhea. I do think we may need to use a contact layer between the [facility] and the wound in order to help with preventing any sticking severely to the wound which is causing some trauma around the periwound. 04/04/2019 upon evaluation today patient appears to be doing well with regard to her wound in fact this appears to be pretty much completely healed though there is a very small area where she still has brand-new skin I think it would be best to probably wrap her 1 more week although again this will just give things a chance to somewhat toughen up. Fortunately there is no  signs of active infection at this time. Electronic Signature(s) Signed: 04/04/2019 4:25:50 PM By: Worthy Keeler PA-C Entered By: Worthy Keeler on 04/04/2019 16:25:50 -------------------------------------------------------------------------------- Physical Exam Details Patient Name: Date of Service: NAKITTA, WAFFLE 04/04/2019 3:00 PM Medical Record [id number] Patient Account Number: [id number] Date of Birth/Sex: Treating RN: 01-19-1946 (74 y.o. [name] Primary Care Provider: Jillyn Ledger Other  Clinician: Referring Provider: Treating Provider/Extender:Stone III, Lyndee Hensen, [name] in Treatment: 28 Constitutional Well-nourished and well-hydrated in no acute distress. Respiratory normal breathing without difficulty. Psychiatric this patient is able to make decisions and demonstrates good insight into disease process. Alert and Oriented x 3. pleasant and cooperative. Notes His wound bed currently showed signs of good granulation at this point there does not appear to be any evidence of active infection which is great news. Overall I am very pleased with how things seem to be progressing. No fevers, chills, nausea, vomiting, or diarrhea. Electronic Signature(s) Signed: 04/04/2019 4:26:05 PM By: Worthy Keeler PA-C Entered By: Worthy Keeler on 04/04/2019 16:26:05 -------------------------------------------------------------------------------- Physician Orders Details Patient Name: Date of Service: [name] 04/04/2019 3:00 PM Medical Record [id number] Patient Account Number: [id number] Date of Birth/Sex: Treating RN: [birth year] (74 y.o. [name] Primary Care Provider: Jillyn Ledger Other Clinician: Referring Provider: Treating Provider/Extender:Stone III, Lyndee Hensen, [name] in Treatment: 12 Verbal / Phone Orders: No Diagnosis Coding ICD-10 Coding Code Description [id number] Non-pressure chronic ulcer of right calf with other specified severity S80.11XD Contusion of right lower leg, subsequent encounter Follow-up Appointments Return Appointment in 1 week. Dressing Change Frequency Wound #1 Right,Lateral Lower Leg Do not change entire dressing for one week. Skin Barriers/Peri-Wound Care Wound #1 Right,Lateral Lower Leg Moisturizing lotion - to leg Wound Cleansing May shower with protection. - can use cast protector Primary Wound Dressing Wound #1 Right,Lateral Lower Leg Mepitel or Adaptic Secondary Dressing Wound #1 Right,Lateral  Lower Leg Dry Gauze Edema Control 3 Layer Compression System - Right Lower Extremity Avoid standing for long periods of time Elevate legs to the level of the heart or above for 30 minutes daily and/or when sitting, a frequency of: Exercise regularly Support Garment 20-30 mm/Hg pressure to: - pt to purchase and bring compression stockings to appointment next week Electronic Signature(s) Signed: 04/04/2019 5:29:37 PM By: Worthy Keeler PA-C Signed: 04/04/2019 5:43:44 PM By: Baruch Gouty RN, BSN Entered By: Baruch Gouty on 04/04/2019 16:27:23 -------------------------------------------------------------------------------- Problem List Details Patient Name: Date of Service: [name] 04/04/2019 3:00 PM Medical Record [id number] Patient Account Number: [id number] Date of Birth/Sex: Treating RN: 1945-11-02 (74 y.o. [name] Primary Care Provider: Jillyn Ledger Other Clinician: Referring Provider: Treating Provider/Extender:Stone III, Lyndee Hensen, [name] in Treatment: 12 Active Problems ICD-10 Evaluated Encounter Code Description Active Date Today Diagnosis L97.218 Non-pressure chronic ulcer of right calf with other 01/05/2019 No Yes specified severity S80.11XD Contusion of right lower leg, subsequent encounter 01/05/2019 No Yes Inactive Problems Resolved Problems Electronic Signature(s) Signed: 04/04/2019 3:47:24 PM By: Worthy Keeler PA-C Entered By: Worthy Keeler on 04/04/2019 15:47:24 -------------------------------------------------------------------------------- Progress Note Details Patient Name: Date of Service: [name] 04/04/2019 3:00 PM Medical Record [id number] Patient Account Number: [id number] Date of Birth/Sex: Treating RN: November 18, 1945 (74 y.o. [name] Primary Care Provider: Jillyn Ledger Other Clinician: Referring Provider: Treating Provider/Extender:Stone III, Lyndee Hensen, [name] in  Treatment: 12 Subjective Chief Complaint Information obtained from Patient 01/05/2019; patient arrives in clinic for review of contusion on the right lateral calf  area History of Present Illness (HPI) ADMISSION 01/05/2019 This is a 74 year old woman with advanced COPD on chronic O2. She managed to strike her lower leg while getting out of a car on Thanksgiving day. She had a wound. The vent they tried to look after themselves at home eventually saw her primary doctor a week later. She was given Keflex and Neosporin. An x-ray was apparently done but I do not have these results. She was seen in the ER last night and given doxycycline this appointment was arranged. The patient has a 3.5 x 2 surface area of black eschar and wound. Swelling inferiorly suggestive of a hematoma. I see no current evidence of infection Past medical history type 2 diabetes recent hemoglobin A1c of 7.6 in April. Chronic COPD the on oxygen. 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 ABI in this clinic was 1.3 on the right 12/18; the necrotic surface of this hematoma area remained intact which is somewhat surprising. The swelling around the area has gotten better. She is still having some discomfort managed with as needed tramadol 12/30-Patient returns with dense looking eschar on top of the hematoma on the right lateral leg calf area. Patient is on O2 around-the-clock 02/07/2019 upon evaluation today patient presents for reevaluation in the clinic concerning issues with her right lower extremity ulcer around the calf. She had a hematoma here that was debrided away. She does have a tunnel at 2:00 and overall this seems to be doing okay though there is some sign of infection at this point there is some erythema and warmth to touch around the wound that has me somewhat concerned. Fortunately there  is no evidence of active infection systemically which is good news. The patient states in general that she is having some pain although this is not terrible she is okay with me attempting sharp debridement to clear away as much of the necrotic tissue as I can. 02/22/2019 upon evaluation today patient appears to be doing better with regard to her ulcer at this point. She has been tolerating the dressing changes without complication. I do feel like she is making good progress and I am very pleased in this regard. With that being said she is continuing to still have some discomfort but I do not feel like it is as bad as previous. I did review her culture today which revealed Staph epidermidis but I do not see really any need indicate any further antibiotics at this point it did show resistant to tetracycline but at the same time her wound appears to be doing better. I do not believe this is indicative of any type of infection at this point. My suggestion would be that she continue with her antibiotic for the time being to completion. 02/21/2019 on evaluation today patient appears to be doing well with regard to her leg ulcer. She is showing some signs of good granulation. With that being said unfortunately this is still somewhat slow to heal she still does have some depth. I do believe that potentially a snap VAC could be of benefit for her this was discussed with the patient and her son today as well it was a question they initially brought up about a wound VAC. I believe that it may actually be an excellent option for her and in fact would probably speed up the recovery quite a bit. She is actually very pleased to hear this and would like to  proceed if at all possible with that today. 02/28/2019 upon evaluation today patient appears to be showing signs of excellent improvement based on what I am seeing currently. Fortunately there is no signs of active infection and in general I feel like she has been  doing extremely well with regard to the snap VAC. In just 1 weeks time again this is excellent and dramatic improvement in my opinion. Overall I am very pleased. I definitely think we can continue as such. 03/07/2019 upon evaluation today patient actually appears to be doing quite well with regard to her wound at this point. She has been tolerating the dressing changes without complication. Fortunately there is no signs of active infection at this time. Overall I am very pleased with the progress. Snap VAC seems to be doing excellent for her. The undermining and tunneling areas of almost completely closed and there is just very tiny areas remaining in 2 spots at this time. I think we are very close to be able to switch to just something such as a Hydrofera Blue dressing. 03/14/19 upon evaluation today patient appears to be doing very well in regard to her old lateral leg ulcer. She is been tolerating the dressing changes without complication fortunately there's no evidence of active infection at this time. I do believe we can likely discontinue the Snap Vac as of today. 03/21/2019 upon evaluation today the patient appears to be doing excellent at this point with regard to her leg ulcer. She has been tolerating the dressing changes and overall seems to be faring quite nicely. I am very pleased with the progress she has made and how the appearance of the wound is today. 03/28/2019 on evaluation today patient appears to be doing well with regard to her wound. Unfortunately the [facility] is getting kind of stuck to the wound bed which is causing her some issues here. Fortunately there is no evidence of active infection which is good news. No fevers, chills, nausea, vomiting, or diarrhea. I do think we may need to use a contact layer between the [facility] and the wound in order to help with preventing any sticking severely to the wound which is causing some trauma around the periwound. 04/04/2019  upon evaluation today patient appears to be doing well with regard to her wound in fact this appears to be pretty much completely healed though there is a very small area where she still has brand-new skin I think it would be best to probably wrap her 1 more week although again this will just give things a chance to somewhat toughen up. Fortunately there is no signs of active infection at this time. Objective Constitutional Well-nourished and well-hydrated in no acute distress. Vitals Time Taken: 3:52 PM, Height: 62 in, Temperature: 98.3 F, Pulse: 73 bpm, Respiratory Rate: 18 breaths/min, Blood Pressure: 117/42 mmHg, Capillary Blood Glucose: 159 mg/dl. Respiratory normal breathing without difficulty. Psychiatric this patient is able to make decisions and demonstrates good insight into disease process. Alert and Oriented x 3. pleasant and cooperative. General Notes: His wound bed currently showed signs of good granulation at this point there does not appear to be any evidence of active infection which is great news. Overall I am very pleased with how things seem to be progressing. No fevers, chills, nausea, vomiting, or diarrhea. Integumentary (Hair, Skin) Wound #1 status is Open. Original cause of wound was Trauma. The wound is located on the Right,Lateral Lower Leg. The wound measures 0.1cm length x 0.1cm width x  0.1cm depth; 0.008cm^2 area and 0.001cm^3 volume. There is no tunneling or undermining noted. There is a none present amount of drainage noted. The wound margin is flat and intact. There is no granulation within the wound bed. There is no necrotic tissue within the wound bed. 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 There was a Three Layer  Compression Therapy Procedure by Carlene Coria, RN. Post procedure Diagnosis Wound #1: Same as Pre-Procedure Plan Follow-up Appointments: Return Appointment in 1 week. Dressing Change Frequency: Wound #1 Right,Lateral Lower Leg: Do not change entire dressing for one week. Skin Barriers/Peri-Wound Care: Wound #1 Right,Lateral Lower Leg: Moisturizing lotion - to leg Wound Cleansing: May shower with protection. - can use cast protector Primary Wound Dressing: Wound #1 Right,Lateral Lower Leg: Mepitel or Adaptic Secondary Dressing: Wound #1 Right,Lateral Lower Leg: Dry Gauze Edema Control: 3 Layer Compression System - Right Lower Extremity Avoid standing for long periods of time Elevate legs to the level of the heart or above for 30 minutes daily and/or when sitting, a frequency of: Exercise regularly Support Garment 20-30 mm/Hg pressure to: - pt to purchase and bring compression stockings to appointment next week 1. I would recommend currently that we continue with the current wound care measures utilizing the compression wrap she seems to be doing well with this. We will use Adaptic over top of the wrap in order to ensure that this maintains good control of the edema also recommend she get compression stockings to transition to next week. 2. I am also can recommend she continue to elevate her legs much as possible. Absent more she does this the better. 3. We will continue to monitor for any signs of infection though honestly she seems to be doing very well today. We will see patient back for reevaluation in 1 week here in the clinic. If anything worsens or changes patient will contact our office for additional recommendations. Electronic Signature(s) Signed: 04/04/2019 4:34:01 PM By: Worthy Keeler PA-C Previous Signature: 04/04/2019 4:26:43 PM Version By: Worthy Keeler PA-C Entered By: Worthy Keeler on 04/04/2019  16:34:00 -------------------------------------------------------------------------------- SuperBill Details Patient Name: Date of Service: [name] 04/04/2019 Medical Record [id number] Patient Account Number: [id number] Date of Birth/Sex: Treating RN: November 01, 1945 (73 y.o. [name] Primary Care Provider: Jillyn Ledger Other Clinician: Referring Provider: Treating Provider/Extender:Stone III, Lyndee Hensen, [name] in Treatment: 12 Diagnosis Coding ICD-10 Codes Code Description [phone] Non-pressure chronic ulcer of right calf with other specified severity S80.11XD Contusion of right lower leg, subsequent encounter Facility Procedures CPT4 Code Description: [id number] (Facility Use Only) [phone] - APPLY [place] RT LEG Modifier: Quantity: 1 Physician Procedures CPT4 Code Description: [id number] [place] - WC PHYS LEVEL 3 - EST PT ICD-10 Diagnosis Description [id number] Non-pressure chronic ulcer of right calf with other spe S80.11XD Contusion of right lower leg, subsequent encounter Modifier: cified severity Quantity: 1 Electronic Signature(s) Signed: 04/04/2019 5:29:37 PM By: Worthy Keeler PA-C Signed: 04/04/2019 5:43:44 PM By: Baruch Gouty RN, BSN Previous Signature: 04/04/2019 4:27:04 PM Version By: Worthy Keeler PA-C Entered By: Baruch Gouty on 04/04/2019 16:27:51

## 2019-04-05 ENCOUNTER — Ambulatory Visit: Payer: Managed Care, Other (non HMO) | Admitting: Dietician

## 2019-04-11 ENCOUNTER — Other Ambulatory Visit: Payer: Self-pay

## 2019-04-11 ENCOUNTER — Encounter (HOSPITAL_BASED_OUTPATIENT_CLINIC_OR_DEPARTMENT_OTHER): Payer: Medicare Other | Admitting: Physician Assistant

## 2019-04-11 DIAGNOSIS — E11622 Type 2 diabetes mellitus with other skin ulcer: Secondary | ICD-10-CM | POA: Diagnosis not present

## 2019-04-11 DIAGNOSIS — J449 Chronic obstructive pulmonary disease, unspecified: Secondary | ICD-10-CM | POA: Diagnosis not present

## 2019-04-11 DIAGNOSIS — L97218 Non-pressure chronic ulcer of right calf with other specified severity: Secondary | ICD-10-CM | POA: Diagnosis not present

## 2019-04-11 DIAGNOSIS — F17210 Nicotine dependence, cigarettes, uncomplicated: Secondary | ICD-10-CM | POA: Diagnosis not present

## 2019-04-11 DIAGNOSIS — Z9981 Dependence on supplemental oxygen: Secondary | ICD-10-CM | POA: Diagnosis not present

## 2019-04-11 DIAGNOSIS — S8011XA Contusion of right lower leg, initial encounter: Secondary | ICD-10-CM | POA: Diagnosis not present

## 2019-04-11 NOTE — Progress Notes (Addendum)
DETA, DIPASQUALE ([id number]) Visit Report for 04/11/2019 Chief Complaint Document Details Patient Name: Date of Service: [name], WEICHMAN 04/11/2019 3:00 PM Medical Record [id number] Patient Account Number: [id number] Date of Birth/Sex: Treating RN: [birth year] (74 y.o. Elam Dutch Primary Care Provider: Jillyn Ledger Other Clinician: Referring Provider: Treating Provider/Extender:Stone III, Lyndee Hensen, [name] in Treatment: 13 Information Obtained from: Patient Chief Complaint 01/05/2019; patient arrives in clinic for review of contusion on the right lateral calf area Electronic Signature(s) Signed: 04/11/2019 3:12:53 PM By: Worthy Keeler PA-C Entered By: Worthy Keeler on 04/11/2019 15:12:53 -------------------------------------------------------------------------------- HPI Details Patient Name: Date of Service: [name] Abbott 04/11/2019 3:00 PM Medical Record [id number] Patient Account Number: [id number] Date of Birth/Sex: Treating RN: 1945-07-18 (75 y.o. Elam Dutch Primary Care Provider: Jillyn Ledger Other Clinician: Referring Provider: Treating Provider/Extender:Stone III, Lyndee Hensen, [name] in Treatment: 13 History of Present Illness HPI Description: ADMISSION 01/05/2019 This is a 74 year old woman with advanced COPD on chronic O2. She managed to strike her lower leg while getting out of a car on Thanksgiving day. She had a wound. The vent they tried to look after themselves at home eventually saw her primary doctor a week later. She was given Keflex and Neosporin. An x-ray was apparently done but I do not have these results. She was seen in the ER last night and given doxycycline this appointment was arranged. The patient has a 3.5 x 2 surface area of black eschar and wound. Swelling inferiorly suggestive of a hematoma. I see no current evidence of infection Past medical history type 2 diabetes recent hemoglobin A1c of 7.6  in April. Chronic COPD the on oxygen. 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 ABI in this clinic was 1.3 on the right 12/18; the necrotic surface of this hematoma area remained intact which is somewhat surprising. The swelling around the area has gotten better. She is still having some discomfort managed with as needed tramadol 12/30-Patient returns with dense looking eschar on top of the hematoma on the right lateral leg calf area. Patient is on O2 around-the-clock 02/07/2019 upon evaluation today patient presents for reevaluation in the clinic concerning issues with her right lower extremity ulcer around the calf. She had a hematoma here that was debrided away. She does have a tunnel at 2:00 and overall this seems to be doing okay though there is some sign of infection at this point there is some erythema and warmth to touch around the wound that has me somewhat concerned. Fortunately there is no evidence of active infection systemically which is good news. The patient states in general that she is having some pain although this is not terrible she is okay with me attempting sharp debridement to clear away as much of the necrotic tissue as I can. 02/22/2019 upon evaluation today patient appears to be doing better with regard to her ulcer at this point. She has been tolerating the dressing changes without complication. I do feel like she is making good progress and I am very pleased in this regard. With that being said she is continuing to still have some discomfort but I do not feel like it is as bad as previous. I did review her culture today which revealed Staph epidermidis but I do not see really any need indicate any further antibiotics at this point it did show resistant to tetracycline but at the same time her wound appears  to be doing better. I do not believe  this is indicative of any type of infection at this point. My suggestion would be that she continue with her antibiotic for the time being to completion. 02/21/2019 on evaluation today patient appears to be doing well with regard to her leg ulcer. She is showing some signs of good granulation. With that being said unfortunately this is still somewhat slow to heal she still does have some depth. I do believe that potentially a snap VAC could be of benefit for her this was discussed with the patient and her son today as well it was a question they initially brought up about a wound VAC. I believe that it may actually be an excellent option for her and in fact would probably speed up the recovery quite a bit. She is actually very pleased to hear this and would like to proceed if at all possible with that today. 02/28/2019 upon evaluation today patient appears to be showing signs of excellent improvement based on what I am seeing currently. Fortunately there is no signs of active infection and in general I feel like she has been doing extremely well with regard to the snap VAC. In just 1 weeks time again this is excellent and dramatic improvement in my opinion. Overall I am very pleased. I definitely think we can continue as such. 03/07/2019 upon evaluation today patient actually appears to be doing quite well with regard to her wound at this point. She has been tolerating the dressing changes without complication. Fortunately there is no signs of active infection at this time. Overall I am very pleased with the progress. Snap VAC seems to be doing excellent for her. The undermining and tunneling areas of almost completely closed and there is just very tiny areas remaining in 2 spots at this time. I think we are very close to be able to switch to just something such as a Hydrofera Blue dressing. 03/14/19 upon evaluation today patient appears to be doing very well in regard to her old lateral leg ulcer. She  is been tolerating the dressing changes without complication fortunately there's no evidence of active infection at this time. I do believe we can likely discontinue the Snap Vac as of today. 03/21/2019 upon evaluation today the patient appears to be doing excellent at this point with regard to her leg ulcer. She has been tolerating the dressing changes and overall seems to be faring quite nicely. I am very pleased with the progress she has made and how the appearance of the wound is today. 03/28/2019 on evaluation today patient appears to be doing well with regard to her wound. Unfortunately the [facility] is getting kind of stuck to the wound bed which is causing her some issues here. Fortunately there is no evidence of active infection which is good news. No fevers, chills, nausea, vomiting, or diarrhea. I do think we may need to use a contact layer between the [facility] and the wound in order to help with preventing any sticking severely to the wound which is causing some trauma around the periwound. 04/04/2019 upon evaluation today patient appears to be doing well with regard to her wound in fact this appears to be pretty much completely healed though there is a very small area where she still has brand-new skin I think it would be best to probably wrap her 1 more week although again this will just give things a chance to somewhat toughen up. Fortunately there is no  signs of active infection at this time. 04/11/2019 upon evaluation today patient appears to be doing well with regard to her leg ulcer this is completely healed and has done excellent. Fortunately there is no signs of active infection at this time. Electronic Signature(s) Signed: 04/11/2019 3:52:41 PM By: Worthy Keeler PA-C Entered By: Worthy Keeler on 04/11/2019 15:52:40 -------------------------------------------------------------------------------- Physical Exam Details Patient Name: Date of Service: [name], HARTVIGSEN 04/11/2019 3:00 PM Medical Record [id number] Patient Account Number: [id number] Date of Birth/Sex: Treating RN: 1945/12/28 (74 y.o. Elam Dutch Primary Care Provider: Jillyn Ledger Other Clinician: Referring Provider: Treating Provider/Extender:Stone III, Lyndee Hensen, [name] in Treatment: 13 Constitutional Obese and well-hydrated in no acute distress. Respiratory normal breathing without difficulty. Psychiatric this patient is able to make decisions and demonstrates good insight into disease process. Alert and Oriented x 3. pleasant and cooperative. Notes His wound showed signs of complete epithelization which is excellent there is no evidence of infection and overall she is done extremely well. I think she is ready for discharge as of today. The patient is extremely pleased to hear this. Electronic Signature(s) Signed: 04/11/2019 3:52:56 PM By: Worthy Keeler PA-C Entered By: Worthy Keeler on 04/11/2019 15:52:56 -------------------------------------------------------------------------------- Physician Orders Details Patient Name: Date of Service: [name] Abbott 04/11/2019 3:00 PM Medical Record [id number] Patient Account Number: [id number] Date of Birth/Sex: Treating RN: Jun 26, 1945 (74 y.o. Elam Dutch Primary Care Provider: BRAKE, Mitzi Hansen Other Clinician: Referring Provider: Treating Provider/Extender:Stone III, Lyndee Hensen, [name] in Treatment: 13 Verbal / Phone Orders: No Diagnosis Coding ICD-10 Coding Code Description [id number] Non-pressure chronic ulcer of right calf with other specified severity S80.11XD Contusion of right lower leg, subsequent encounter Discharge From [facility] Discharge from [place] lotion - to both legs daily at night Edema Control Avoid standing for long periods of time Elevate legs to the level of the heart or above for 30 minutes daily and/or when  sitting, a frequency of: Exercise regularly Support Garment 20-30 mm/Hg pressure to: - compression stockings to both legs daily Electronic Signature(s) Signed: 04/11/2019 4:26:16 PM By: Worthy Keeler PA-C Signed: 04/11/2019 4:42:28 PM By: Baruch Gouty RN, BSN Entered By: Baruch Gouty on 04/11/2019 15:51:08 -------------------------------------------------------------------------------- Problem List Details Patient Name: Date of Service: [name] Abbott 04/11/2019 3:00 PM Medical Record [id number] Patient Account Number: [id number] Date of Birth/Sex: Treating RN: 1945-07-05 (74 y.o. Elam Dutch Primary Care Provider: Jillyn Ledger Other Clinician: Referring Provider: Treating Provider/Extender:Stone III, Lyndee Hensen, [name] in Treatment: 13 Active Problems ICD-10 Evaluated Encounter Code Description Active Date Today Diagnosis L97.218 Non-pressure chronic ulcer of right calf with other 01/05/2019 No Yes specified severity S80.11XD Contusion of right lower leg, subsequent encounter 01/05/2019 No Yes Inactive Problems Resolved Problems Electronic Signature(s) Signed: 04/11/2019 3:12:47 PM By: Worthy Keeler PA-C Entered By: Worthy Keeler on 04/11/2019 15:12:47 -------------------------------------------------------------------------------- Progress Note Details Patient Name: Date of Service: [name] Abbott 04/11/2019 3:00 PM Medical Record [id number] Patient Account Number: [id number] Date of Birth/Sex: Treating RN: Mar 09, 1945 (74 y.o. Elam Dutch Primary Care Provider: Jillyn Ledger Other Clinician: Referring Provider: Treating Provider/Extender:Stone III, Lyndee Hensen, [name] in Treatment: 13 Subjective Chief Complaint Information obtained from Patient 01/05/2019; patient arrives in clinic for review of contusion on the right lateral calf area History of Present Illness (HPI) ADMISSION 01/05/2019 This is a 74 year old  woman with advanced COPD on chronic O2. She managed to strike her lower leg while getting out  of a car on Thanksgiving day. She had a wound. The vent they tried to look after themselves at home eventually saw her primary doctor a week later. She was given Keflex and Neosporin. An x-ray was apparently done but I do not have these results. She was seen in the ER last night and given doxycycline this appointment was arranged. The patient has a 3.5 x 2 surface area of black eschar and wound. Swelling inferiorly suggestive of a hematoma. I see no current evidence of infection Past medical history type 2 diabetes recent hemoglobin A1c of 7.6 in April. Chronic COPD the on oxygen. 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 ABI in this clinic was 1.3 on the right 12/18; the necrotic surface of this hematoma area remained intact which is somewhat surprising. The swelling around the area has gotten better. She is still having some discomfort managed with as needed tramadol 12/30-Patient returns with dense looking eschar on top of the hematoma on the right lateral leg calf area. Patient is on O2 around-the-clock 02/07/2019 upon evaluation today patient presents for reevaluation in the clinic concerning issues with her right lower extremity ulcer around the calf. She had a hematoma here that was debrided away. She does have a tunnel at 2:00 and overall this seems to be doing okay though there is some sign of infection at this point there is some erythema and warmth to touch around the wound that has me somewhat concerned. Fortunately there is no evidence of active infection systemically which is good news. The patient states in general that she is having some pain although this is not terrible she is okay with me attempting sharp debridement to clear away as much of the necrotic  tissue as I can. 02/22/2019 upon evaluation today patient appears to be doing better with regard to her ulcer at this point. She has been tolerating the dressing changes without complication. I do feel like she is making good progress and I am very pleased in this regard. With that being said she is continuing to still have some discomfort but I do not feel like it is as bad as previous. I did review her culture today which revealed Staph epidermidis but I do not see really any need indicate any further antibiotics at this point it did show resistant to tetracycline but at the same time her wound appears to be doing better. I do not believe this is indicative of any type of infection at this point. My suggestion would be that she continue with her antibiotic for the time being to completion. 02/21/2019 on evaluation today patient appears to be doing well with regard to her leg ulcer. She is showing some signs of good granulation. With that being said unfortunately this is still somewhat slow to heal she still does have some depth. I do believe that potentially a snap VAC could be of benefit for her this was discussed with the patient and her son today as well it was a question they initially brought up about a wound VAC. I believe that it may actually be an excellent option for her and in fact would probably speed up the recovery quite a bit. She is actually very pleased to hear this and would like to proceed if at all possible with that today. 02/28/2019 upon evaluation today patient appears to be showing signs of excellent improvement based on what I am seeing currently. Fortunately  there is no signs of active infection and in general I feel like she has been doing extremely well with regard to the snap VAC. In just 1 weeks time again this is excellent and dramatic improvement in my opinion. Overall I am very pleased. I definitely think we can continue as such. 03/07/2019 upon evaluation today  patient actually appears to be doing quite well with regard to her wound at this point. She has been tolerating the dressing changes without complication. Fortunately there is no signs of active infection at this time. Overall I am very pleased with the progress. Snap VAC seems to be doing excellent for her. The undermining and tunneling areas of almost completely closed and there is just very tiny areas remaining in 2 spots at this time. I think we are very close to be able to switch to just something such as a Hydrofera Blue dressing. 03/14/19 upon evaluation today patient appears to be doing very well in regard to her old lateral leg ulcer. She is been tolerating the dressing changes without complication fortunately there's no evidence of active infection at this time. I do believe we can likely discontinue the Snap Vac as of today. 03/21/2019 upon evaluation today the patient appears to be doing excellent at this point with regard to her leg ulcer. She has been tolerating the dressing changes and overall seems to be faring quite nicely. I am very pleased with the progress she has made and how the appearance of the wound is today. 03/28/2019 on evaluation today patient appears to be doing well with regard to her wound. Unfortunately the [facility] is getting kind of stuck to the wound bed which is causing her some issues here. Fortunately there is no evidence of active infection which is good news. No fevers, chills, nausea, vomiting, or diarrhea. I do think we may need to use a contact layer between the [facility] and the wound in order to help with preventing any sticking severely to the wound which is causing some trauma around the periwound. 04/04/2019 upon evaluation today patient appears to be doing well with regard to her wound in fact this appears to be pretty much completely healed though there is a very small area where she still has brand-new skin I think it would be best to  probably wrap her 1 more week although again this will just give things a chance to somewhat toughen up. Fortunately there is no signs of active infection at this time. 04/11/2019 upon evaluation today patient appears to be doing well with regard to her leg ulcer this is completely healed and has done excellent. Fortunately there is no signs of active infection at this time. Objective Constitutional Obese and well-hydrated in no acute distress. Vitals Time Taken: 3:28 PM, Height: 62 in, Temperature: 98.6 F, Pulse: 75 bpm, Respiratory Rate: 18 breaths/min, Blood Pressure: 144/52 mmHg, Capillary Blood Glucose: 168 mg/dl. Respiratory normal breathing without difficulty. Psychiatric this patient is able to make decisions and demonstrates good insight into disease process. Alert and Oriented x 3. pleasant and cooperative. General Notes: His wound showed signs of complete epithelization which is excellent there is no evidence of infection and overall she is done extremely well. I think she is ready for discharge as of today. The patient is extremely pleased to hear this. Integumentary (Hair, Skin) Wound #1 status is Healed - Epithelialized. Original cause of wound was Trauma. The wound is located on the Right,Lateral Lower Leg. The wound measures 0cm length x  0cm width x 0cm depth; 0cm^2 area and 0cm^3 volume. Assessment Active Problems ICD-10 Non-pressure chronic ulcer of right calf with other specified severity Contusion of right lower leg, subsequent encounter Plan Discharge From [facility] Services: Discharge from [place] [facility] Barriers/Peri-Wound Care: Moisturizing lotion - to both legs daily at night Edema Control: Avoid standing for long periods of time Elevate legs to the level of the heart or above for 30 minutes daily and/or when sitting, a frequency of: Exercise regularly Support Garment 20-30 mm/Hg pressure to: - compression stockings to both legs daily 1. I recommend we  discontinue wound care services at this point as the patient is completely healed. 2. Also can recommend that she continue to use a 20-30 mmHg compression stocking daily ongoing I think this will help prevent anything from breaking down or worsening. 3. Also can recommend that she elevate her legs especially if she has been standing for any length of time. The more she can do this it also help with edema control. We will see her back for follow-up visit as needed. Electronic Signature(s) Signed: 04/11/2019 3:53:26 PM By: Worthy Keeler PA-C Entered By: Worthy Keeler on 04/11/2019 15:53:26 -------------------------------------------------------------------------------- SuperBill Details Patient Name: Date of Service: [name] Abbott 04/11/2019 Medical Record [id number] Patient Account Number: [id number] Date of Birth/Sex: Treating RN: Jun 04, 1945 (74 y.o. Elam Dutch Primary Care Provider: Jillyn Ledger Other Clinician: Referring Provider: Treating Provider/Extender:Stone III, Lyndee Hensen, [name] in Treatment: 13 Diagnosis Coding ICD-10 Codes Code Description L97.218 Non-pressure chronic ulcer of right calf with other specified severity S80.11XD Contusion of right lower leg, subsequent encounter Facility Procedures CPT4 Code: [id number] Description: 99213 - WOUND CARE VISIT-LEV 3 EST PT Modifier: Quantity: 1 Physician Procedures CPT4 Code Description: [id number] - WC PHYS LEVEL 3 - EST PT ICD-10 Diagnosis Description [id number] Non-pressure chronic ulcer of right calf with other spe S80.11XD Contusion of right lower leg, subsequent encounter Modifier: cified severity Quantity: 1 Electronic Signature(s) Signed: 04/11/2019 3:53:36 PM By: Worthy Keeler PA-C Entered By: Worthy Keeler on 04/11/2019 15:53:36

## 2019-04-12 DIAGNOSIS — H401321 Pigmentary glaucoma, left eye, mild stage: Secondary | ICD-10-CM | POA: Diagnosis not present

## 2019-04-12 DIAGNOSIS — H401311 Pigmentary glaucoma, right eye, mild stage: Secondary | ICD-10-CM | POA: Diagnosis not present

## 2019-04-19 ENCOUNTER — Ambulatory Visit: Payer: Managed Care, Other (non HMO) | Admitting: Dietician

## 2019-04-19 DIAGNOSIS — K5900 Constipation, unspecified: Secondary | ICD-10-CM | POA: Diagnosis not present

## 2019-04-19 DIAGNOSIS — J410 Simple chronic bronchitis: Secondary | ICD-10-CM | POA: Diagnosis not present

## 2019-04-19 DIAGNOSIS — F251 Schizoaffective disorder, depressive type: Secondary | ICD-10-CM | POA: Diagnosis not present

## 2019-04-19 DIAGNOSIS — Z794 Long term (current) use of insulin: Secondary | ICD-10-CM | POA: Diagnosis not present

## 2019-04-19 DIAGNOSIS — F17210 Nicotine dependence, cigarettes, uncomplicated: Secondary | ICD-10-CM | POA: Diagnosis not present

## 2019-04-19 DIAGNOSIS — I10 Essential (primary) hypertension: Secondary | ICD-10-CM | POA: Diagnosis not present

## 2019-04-19 DIAGNOSIS — E782 Mixed hyperlipidemia: Secondary | ICD-10-CM | POA: Diagnosis not present

## 2019-04-19 DIAGNOSIS — E1165 Type 2 diabetes mellitus with hyperglycemia: Secondary | ICD-10-CM | POA: Diagnosis not present

## 2019-04-20 ENCOUNTER — Encounter: Payer: Self-pay | Admitting: Dietician

## 2019-04-20 ENCOUNTER — Encounter: Payer: Medicare Other | Attending: Family Medicine | Admitting: Dietician

## 2019-04-20 DIAGNOSIS — E118 Type 2 diabetes mellitus with unspecified complications: Secondary | ICD-10-CM | POA: Diagnosis not present

## 2019-04-20 NOTE — Progress Notes (Signed)
Medical Nutrition Therapy:  Appt start time: 1510 end time:  5797.   Assessment:  Primary concerns today: Spoke with patient today via phone due to the [place] pandemic as well as her own limited mobility and transportation issues.   I met with her last 01/11/20 which was an in person visit.  She states that she is now using nicotine patches to help her quit smoking but has continued to use cigarettes. She continues to drink 2 energy drinks per day. Continues 2 meals plus snacks daily Her leg wound is completely healed. Her son continues to come midday and check her blood sugar and prepare her lunch.  She makes a chicken soup for dinner at times or TV dinners. She reports average blood glucose after lunch History includes polyneuropathy, hyperlipidemia, HTN, COPD, osteoporosis, smokes, toe amputation, dental issues and few teeth, and schizoaffective disorder. Medications include: Glipizide, basaglar 34 units q hs, Metformin XR, Novolog  12 units before lunch, and 12 units before dinner. She does not take the breakfast dose as she skips this meal. Last known A1C 7.3% decreased from 10.1% 02/27/2018. She states that she had an A1C test yesterday but test results are pending.  Patient lives alone. She has 2 children nearby who are helpful. She moved here 12 years ago from [place] and is retired from the [employer]. She relies on [employer] or the city bus for transportation.Her sons are currently helping her with transportation. One son has type 2 diabetes.[place] is poor and requires soft food.  Preferred Learning Style:   No preference indicated   Learning Readiness:   Ready  MEDICATIONS: see list to include   DIETARY INTAKE:  24-hr recall:  B ( AM): skips  Snk ( AM): none  L ( PM): yogurt, boiled egg, applesauce, spinach leaves, sandwich ([place] and cheese, tuna or chicken salad) Snk ( PM): dried apricots, black licorice D ( PM): Healthy Choice frozen meal (2) Snk (  PM):  Beverages: water, coffee with sweet and low, [employer] Energy Drink (2 daily), occasional diet coke  Usual physical activity: ADL's  Estimated energy needs: 1400 calories 70 g protein  Progress Towards Goal(s):  In progress.   Nutritional Diagnosis:  NB-1.1 Food and nutrition-related knowledge deficit As related to balance of carbohydrate, protein, and fat.  As evidenced by diet hx and patient report.    Intervention:  Nutrition education/counseling continued related to nutrition with meals and snacks.  Discussed dangers of smoking and using the nicotine patch which patient was able to verbalize.  Advised against smoking.  Encouraged use of beverages without sugar and to decrease Monster energy drinks.  Teaching Method Utilized:  Auditory  Barriers to learning/adherence to lifestyle change: readiness to change, health status  Demonstrated degree of understanding via:  Teach Back   Monitoring/Evaluation:  Dietary intake, exercise, and body weight in 3 month(s).

## 2019-04-24 ENCOUNTER — Ambulatory Visit: Payer: Medicare Other | Attending: Internal Medicine

## 2019-04-24 DIAGNOSIS — I499 Cardiac arrhythmia, unspecified: Secondary | ICD-10-CM | POA: Diagnosis not present

## 2019-04-24 DIAGNOSIS — Z23 Encounter for immunization: Secondary | ICD-10-CM

## 2019-04-24 DIAGNOSIS — R52 Pain, unspecified: Secondary | ICD-10-CM | POA: Diagnosis not present

## 2019-04-24 DIAGNOSIS — R0902 Hypoxemia: Secondary | ICD-10-CM | POA: Diagnosis not present

## 2019-04-24 DIAGNOSIS — R531 Weakness: Secondary | ICD-10-CM | POA: Diagnosis not present

## 2019-04-24 DIAGNOSIS — M5489 Other dorsalgia: Secondary | ICD-10-CM | POA: Diagnosis not present

## 2019-05-09 NOTE — Progress Notes (Signed)
[name], [name] ([id number]) Visit Report for 03/28/2019 Arrival Information Details Patient Name: Date of Service: [name], [name] 03/28/2019 3:15 PM Medical Record [id number] Patient Account Number: [id number] Date of Birth/Sex: Treating RN: [birth year] (74 y.o. [name] Primary Care Larayah Clute: BRAKE, ANDREW Other Clinician: Referring Tiwanna Tuch: Treating Ysabelle Goodroe/Extender:Stone III, Lyndee Hensen, [name] in Treatment: 11 Visit Information History Since Last Visit Added or deleted any medications: No Patient Arrived: Wheel Chair Any new allergies or adverse reactions: No Arrival Time: 15:45 Had a fall or experienced change in No activities of daily living that may affect Accompanied By: son risk of falls: Transfer Assistance: None Signs or symptoms of abuse/neglect since last No Patient Identification Verified: Yes visito Secondary Verification Process Yes Hospitalized since last visit: No Completed: Implantable device outside of the clinic excluding No Patient Requires Transmission-Based No cellular tissue based products placed in the center Precautions: since last visit: Patient Has Alerts: No Has Dressing in Place as Prescribed: Yes Pain Present Now: No Electronic Signature(s) Signed: 05/09/2019 9:21:08 AM By: Sandre Kitty Entered By: Sandre Kitty on 03/28/2019 15:46:23 -------------------------------------------------------------------------------- Compression Therapy Details Patient Name: Date of Service: [name] 03/28/2019 3:15 PM Medical Record [id number] Patient Account Number: [id number] Date of Birth/Sex: Treating RN: 11-24-1945 (74 y.o. [name] Primary Care Alberto Pina: Jillyn Ledger Other Clinician: Referring Fatemah Pourciau: Treating Kylee Nardozzi/Extender:Stone III, Lyndee Hensen, [name] in Treatment: 11 Compression Therapy Performed for Wound Wound #1 Right,Lateral Lower Leg Assessment: Performed By: Clinician  Levan Hurst, RN Compression Type: Three Layer Post Procedure Diagnosis Same as Pre-procedure Electronic Signature(s) Signed: 03/28/2019 5:54:02 PM By: Levan Hurst RN, BSN Entered By: Levan Hurst on 03/28/2019 16:14:51 -------------------------------------------------------------------------------- Encounter Discharge Information Details Patient Name: Date of Service: [name] 03/28/2019 3:15 PM Medical Record [id number] Patient Account Number: [id number] Date of Birth/Sex: Treating RN: 1945/12/20 (74 y.o. [name] Primary Care Travis Mastel: Jillyn Ledger Other Clinician: Referring Kinzee Happel: Treating Eathon Valade/Extender:Stone III, Lyndee Hensen, [name] in Treatment: 11 Encounter Discharge Information Items Discharge Condition: Stable Ambulatory Status: Wheelchair Discharge Destination: Home Transportation: Private Auto Accompanied By: son Schedule Follow-up Appointment: Yes Clinical Summary of Care: Patient Declined Electronic Signature(s) Signed: 03/28/2019 5:16:33 PM By: Carlene Coria RN Entered By: Carlene Coria on 03/28/2019 16:36:52 -------------------------------------------------------------------------------- Lower Extremity Assessment Details Patient Name: Date of Service: [name], [name] 03/28/2019 3:15 PM Medical Record [id number] Patient Account Number: [id number] Date of Birth/Sex: Treating RN: [birth year] (74 y.o. [name] Primary Care Ayriana Wix: BRAKE, ANDREW Other Clinician: Referring Daegon Deiss: Treating Milta Croson/Extender:Stone III, Lyndee Hensen, [name] in Treatment: 11 Edema Assessment Assessed: [Left: No] [Right: Yes] Edema: [Left: Ye] [Right: s] Calf Left: Right: Point of Measurement: 28 cm From Medial Instep cm 37.5 cm Ankle Left: Right: Point of Measurement: 10 cm From Medial Instep cm 22 cm Vascular Assessment Pulses: Dorsalis Pedis Palpable: [Right:Yes] Electronic Signature(s) Signed: 03/28/2019 5:43:51 PM  By: Deon Pilling Entered By: Deon Pilling on 03/28/2019 16:02:15 -------------------------------------------------------------------------------- Multi-Disciplinary Care Plan Details Patient Name: Date of Service: [name] 03/28/2019 3:15 PM Medical Record [id number] Patient Account Number: [id number] Date of Birth/Sex: Treating RN: Oct 18, 1945 (74 y.o. [name] Primary Care Joahan Swatzell: Jillyn Ledger Other Clinician: Referring Samari Gorby: Treating Marjan Rosman/Extender:Stone III, Lyndee Hensen, [name] in Treatment: 11 Active Inactive Venous Leg Ulcer Nursing Diagnoses: Potential for venous Insuffiency (use before diagnosis confirmed) Goals: Patient will maintain optimal edema control Date Initiated: 02/07/2019 Target Resolution Date: 04/04/2019 Goal Status: Active Interventions: Assess peripheral edema status every visit. Compression as ordered Provide education on venous  insufficiency Treatment Activities: Therapeutic compression applied : 02/07/2019 Notes: Wound/Skin Impairment Nursing Diagnoses: Impaired tissue integrity Goals: Patient/caregiver will verbalize understanding of skin care regimen Date Initiated: 02/07/2019 Target Resolution Date: 04/04/2019 Goal Status: Active Ulcer/skin breakdown will have a volume reduction of 30% by week 4 Date Initiated: 01/05/2019 Date Inactivated: 02/07/2019 Target Resolution Date: 02/02/2019 Unmet Reason: hematoma Goal Status: Unmet unroofed Ulcer/skin breakdown will have a volume reduction of 50% by week 8 Date Initiated: 02/07/2019 Date Inactivated: 03/07/2019 Target Resolution Date: 03/07/2019 Goal Status: Met Ulcer/skin breakdown will have a volume reduction of 80% by week 12 Date Initiated: 03/07/2019 Target Resolution Date: 04/04/2019 Goal Status: Active Interventions: Provide education on smoking Provide education on ulcer and skin care Notes: Electronic Signature(s) Signed: 03/28/2019 5:54:02 PM By: Levan Hurst RN, BSN Entered By: Levan Hurst on 03/28/2019 17:28:08 -------------------------------------------------------------------------------- Pain Assessment Details Patient Name: Date of Service: [name] 03/28/2019 3:15 PM Medical Record [id number] Patient Account Number: [id number] Date of Birth/Sex: Treating RN: 04/19/1945 (74 y.o. [name] Primary Care Analaya Hoey: Jillyn Ledger Other Clinician: Referring Keeon Zurn: Treating Jearl Soto/Extender:Stone III, Lyndee Hensen, [name] in Treatment: 11 Active Problems Location of Pain Severity and Description of Pain Patient Has Paino No Site Locations Pain Management and Medication Current Pain Management: Electronic Signature(s) Signed: 03/28/2019 5:54:02 PM By: Levan Hurst RN, BSN Signed: 05/09/2019 9:21:08 AM By: Sandre Kitty Entered By: Sandre Kitty on 03/28/2019 15:46:54 -------------------------------------------------------------------------------- Patient/Caregiver Education Details Patient Name: Date of Service: [name] 3/3/2021andnbsp3:15 PM Medical Record [id number] Patient Account Number: [id number] Date of Birth/Gender: Treating RN: 1945/12/06 (74 y.o. [name] Primary Care Physician: Jillyn Ledger Other Clinician: Referring Physician: Treating Physician/Extender:Stone III, Lyndee Hensen, [name] in Treatment: 11 Education Assessment Education Provided To: Patient Education Topics Provided Wound/Skin Impairment: Methods: Explain/Verbal Responses: State content correctly Electronic Signature(s) Signed: 03/28/2019 5:54:02 PM By: Levan Hurst RN, BSN Entered By: Levan Hurst on 03/28/2019 17:28:19 -------------------------------------------------------------------------------- Wound Assessment Details Patient Name: Date of Service: [name] 03/28/2019 3:15 PM Medical Record [id number] Patient Account Number: [id number] Date of  Birth/Sex: Treating RN: 01/25/1946 (74 y.o. [name] Primary Care Ailany Koren: BRAKE, ANDREW Other Clinician: Referring Mazen Marcin: Treating Midori Dado/Extender:Stone III, Lyndee Hensen, [name] in Treatment: 11 Wound Status Wound Number: 1 Primary Venous Leg Ulcer Etiology: Wound Location: Right Lower Leg - Lateral Wound Open Wounding Event: Trauma Status: Date Acquired: 12/21/2018 Comorbid Glaucoma, Chronic Obstructive Pulmonary Weeks Of Treatment: 11 History: Disease (COPD), Hypertension, Type II Clustered Wound: No Diabetes, Osteoarthritis, Osteomyelitis, Neuropathy, Confinement Anxiety Photos Wound Measurements Length: (cm) 1.5 % Reduct Width: (cm) 0.7 % Reduct Depth: (cm) 0.1 Epitheli Area: (cm) 0.825 Tunneli Volume: (cm) 0.082 Undermi Wound Description Full Thickness Without Exposed Support Foul Od Classification: Structures Slough/ Wound Flat and Intact Margin: Exudate Small Amount: Exudate Serosanguineous Type: Exudate red, brown Color: Wound Bed Granulation Amount: Large (67-100%) Granulation Quality: Red Fascia E Necrotic Amount: Small (1-33%) Fat Laye Necrotic Quality: Adherent Slough Tendon E Muscle E Joint Expo Bone Expos or After Cleansing: No Fibrino Yes Exposed Structure xposed: No r (Subcutaneous Tissue) Exposed: Yes xposed: No xposed: No sed: No ed: No ion in Area: 85% ion in Volume: 85.1% alization: Large (67-100%) ng: No ning: No Electronic Signature(s) Signed: 03/30/2019 4:01:29 PM By: Mikeal Hawthorne EMT/HBOT Signed: 04/02/2019 5:59:01 PM By: Levan Hurst RN, BSN Previous Signature: 03/28/2019 5:43:51 PM Version By: Deon Pilling Entered By: Mikeal Hawthorne on 03/30/2019 11:51:30 -------------------------------------------------------------------------------- Vitals Details Patient Name: Date of Service: Sally Salvo A. 03/28/2019 3:15 PM  Medical Record [id number] Patient Account Number: [id number] Date of  Birth/Sex: Treating RN: 1945/03/22 (74 y.o. [name] Primary Care Kru Allman: BRAKE, ANDREW Other Clinician: Referring Alissia Lory: Treating Curry Dulski/Extender:Stone III, Lyndee Hensen, [name] in Treatment: 11 Vital Signs Time Taken: 15:46 Temperature (F): 98.7 Height (in): 62 Pulse (bpm): 71 Respiratory Rate (breaths/min): 18 Blood Pressure (mmHg): 106/42 Capillary Blood Glucose (mg/dl): 310 Reference Range: 80 - 120 mg / dl Electronic Signature(s) Signed: 05/09/2019 9:21:08 AM By: Sandre Kitty Entered By: Sandre Kitty on 03/28/2019 15:46:48

## 2019-05-09 NOTE — Progress Notes (Signed)
MOLLEIGH, DISHNER ([id number]) Visit Report for 04/11/2019 Arrival Information Details Patient Name: Date of Service: HARLEY, ANDRESEN 04/11/2019 3:00 PM Medical Record [id number] Patient Account Number: [id number] Date of Birth/Sex: Treating RN: Feb 15, 1945 (74 y.o. Elam Dutch Primary Care Ancel Easler: BRAKE, Mitzi Hansen Other Clinician: Referring Nerida Boivin: Treating Philis Doke/Extender:Stone III, Lyndee Hensen, [name] in Treatment: 13 Visit Information History Since Last Visit Added or deleted any medications: No Patient Arrived: Wheel Chair Any new allergies or adverse reactions: No Arrival Time: 15:27 Had a fall or experienced change in No activities of daily living that may affect Accompanied By: son risk of falls: Transfer Assistance: None Signs or symptoms of abuse/neglect since last No Patient Identification Verified: Yes visito Secondary Verification Process Yes Hospitalized since last visit: No Completed: Implantable device outside of the clinic excluding No Patient Requires Transmission-Based No cellular tissue based products placed in the center Precautions: since last visit: Patient Has Alerts: No Has Dressing in Place as Prescribed: Yes Pain Present Now: No Electronic Signature(s) Signed: 05/09/2019 9:21:08 AM By: Sandre Kitty Entered By: Sandre Kitty on 04/11/2019 15:28:08 -------------------------------------------------------------------------------- Clinic Level of Care Assessment Details Patient Name: Date of Service: GERMAINE, SITZES 04/11/2019 3:00 PM Medical Record [id number] Patient Account Number: [id number] Date of Birth/Sex: Treating RN: [birth year] (74 y.o. Elam Dutch Primary Care Tylek Boney: BRAKE, Mitzi Hansen Other Clinician: Referring Delayna Sparlin: Treating Davina Howlett/Extender:Stone III, Lyndee Hensen, [name] in Treatment: 13 Clinic Level of Care Assessment Items TOOL 4 Quantity Score []  - Use when only an EandM is  performed on FOLLOW-UP visit 0 ASSESSMENTS - Nursing Assessment / Reassessment X - Reassessment of Co-morbidities (includes updates in patient status) 1 10 X - Reassessment of Adherence to Treatment Plan 1 5 ASSESSMENTS - Wound and Skin Assessment / Reassessment X - Simple Wound Assessment / Reassessment - one wound 1 5 []  - Complex Wound Assessment / Reassessment - multiple wounds 0 []  - Dermatologic / Skin Assessment (not related to wound area) 0 ASSESSMENTS - Focused Assessment X - Circumferential Edema Measurements - multi extremities 1 5 []  - Nutritional Assessment / Counseling / Intervention 0 X - Lower Extremity Assessment (monofilament, tuning fork, pulses) 1 5 []  - Peripheral Arterial Disease Assessment (using hand held doppler) 0 ASSESSMENTS - Ostomy and/or Continence Assessment and Care []  - Incontinence Assessment and Management 0 []  - Ostomy Care Assessment and Management (repouching, etc.) 0 PROCESS - Coordination of Care X - Simple Patient / Family Education for ongoing care 1 15 []  - Complex (extensive) Patient / Family Education for ongoing care 0 X - Staff obtains Programmer, systems, Records, Test Results / Process Orders 1 10 []  - Staff telephones HHA, Nursing Homes / Clarify orders / etc 0 []  - Routine Transfer to another Facility (non-emergent condition) 0 []  - Routine Hospital Admission (non-emergent condition) 0 []  - New Admissions / Biomedical engineer / Ordering NPWT, Apligraf, etc. 0 []  - Emergency Hospital Admission (emergent condition) 0 X - Simple Discharge Coordination 1 10 []  - Complex (extensive) Discharge Coordination 0 PROCESS - Special Needs []  - Pediatric / Minor Patient Management 0 []  - Isolation Patient Management 0 []  - Hearing / Language / Visual special needs 0 []  - Assessment of Community assistance (transportation, D/C planning, etc.) 0 []  - Additional assistance / Altered mentation 0 []  - Support Surface(s) Assessment (bed, cushion, seat, etc.)  0 INTERVENTIONS - Wound Cleansing / Measurement X - Simple Wound Cleansing - one wound 1 5 []  - Complex Wound Cleansing - multiple wounds 0 X -  Wound Imaging (photographs - any number of wounds) 1 5 []  - Wound Tracing (instead of photographs) 0 []  - Simple Wound Measurement - one wound 0 []  - Complex Wound Measurement - multiple wounds 0 INTERVENTIONS - Wound Dressings []  - Small Wound Dressing one or multiple wounds 0 []  - Medium Wound Dressing one or multiple wounds 0 []  - Large Wound Dressing one or multiple wounds 0 []  - Application of Medications - topical 0 []  - Application of Medications - injection 0 INTERVENTIONS - Miscellaneous []  - External ear exam 0 []  - Specimen Collection (cultures, biopsies, blood, body fluids, etc.) 0 []  - Specimen(s) / Culture(s) sent or taken to Lab for analysis 0 []  - Patient Transfer (multiple staff / Civil Service fast streamer / Similar devices) 0 []  - Simple Staple / Suture removal (25 or less) 0 []  - Complex Staple / Suture removal (26 or more) 0 []  - Hypo / Hyperglycemic Management (close monitor of Blood Glucose) 0 []  - Ankle / Brachial Index (ABI) - do not check if billed separately 0 X - Vital Signs 1 5 Has the patient been seen at the hospital within the last three years: Yes Total Score: 80 Level Of Care: New/Established - Level 3 Electronic Signature(s) Signed: 04/11/2019 4:42:28 PM By: Baruch Gouty RN, BSN Entered By: Baruch Gouty on 04/11/2019 15:51:34 -------------------------------------------------------------------------------- Lower Extremity Assessment Details Patient Name: Date of Service: [name] 04/11/2019 3:00 PM Medical Record [id number] Patient Account Number: [id number] Date of Birth/Sex: Treating RN: 1945-04-10 (74 y.o. Elam Dutch Primary Care Nikhil Osei: BRAKE, Mitzi Hansen Other Clinician: Referring Erskine Steinfeldt: Treating Theone Bowell/Extender:Stone III, Lyndee Hensen, [name] in Treatment: 13 Edema  Assessment Assessed: [Left: No] [Right: No] Edema: [Left: Ye] [Right: s] Calf Left: Right: Point of Measurement: 28 cm From Medial Instep cm 37 cm Ankle Left: Right: Point of Measurement: 10 cm From Medial Instep cm 21 cm Vascular Assessment Pulses: Dorsalis Pedis Palpable: [Right:Yes] Electronic Signature(s) Signed: 04/11/2019 4:42:28 PM By: Baruch Gouty RN, BSN Signed: 05/09/2019 9:21:08 AM By: Sandre Kitty Entered By: Sandre Kitty on 04/11/2019 15:29:14 -------------------------------------------------------------------------------- Multi-Disciplinary Care Plan Details Patient Name: Date of Service: [name]. 04/11/2019 3:00 PM Medical Record [id number] Patient Account Number: [id number] Date of Birth/Sex: Treating RN: [birth year] (74 y.o. Elam Dutch Primary Care Bridget Westbrooks: Jillyn Ledger Other Clinician: Referring Alveena Taira: Treating Nafeesa Dils/Extender:Stone III, Lyndee Hensen, [name] in Treatment: 13 Active Inactive Electronic Signature(s) Signed: 04/11/2019 4:42:28 PM By: Baruch Gouty RN, BSN Entered By: Baruch Gouty on 04/11/2019 15:26:28 -------------------------------------------------------------------------------- Pain Assessment Details Patient Name: Date of Service: [name] 04/11/2019 3:00 PM Medical Record [id number] Patient Account Number: [id number] Date of Birth/Sex: Treating RN: 1945-06-04 (74 y.o. Elam Dutch Primary Care Cristopher Ciccarelli: Jillyn Ledger Other Clinician: Referring Kaziah Krizek: Treating Trev Boley/Extender:Stone III, Lyndee Hensen, [name] in Treatment: 13 Active Problems Location of Pain Severity and Description of Pain Patient Has Paino No Site Locations Pain Management and Medication Current Pain Management: Electronic Signature(s) Signed: 04/11/2019 4:42:28 PM By: Baruch Gouty RN, BSN Signed: 05/09/2019 9:21:08 AM By: Sandre Kitty Entered By: Sandre Kitty on 04/11/2019  15:28:54 -------------------------------------------------------------------------------- Patient/Caregiver Education Details Patient Name: Date of Service: Dassow, Kalisa A. 3/17/2021andnbsp3:00 PM Medical Record [id number] Patient Account Number: [id number] Date of Birth/Gender: Treating RN: Jan 31, 1945 (74 y.o. Elam Dutch Primary Care Physician: Jillyn Ledger Other Clinician: Referring Physician: Treating Physician/Extender:Stone III, Lyndee Hensen, [name] in Treatment: 13 Education Assessment Education Provided To: Patient Education Topics Provided Wound/Skin Impairment: Methods: Explain/Verbal Responses: Reinforcements needed, State content correctly Electronic  Signature(s) Signed: 04/11/2019 4:42:28 PM By: Baruch Gouty RN, BSN Entered By: Baruch Gouty on 04/11/2019 15:26:08 -------------------------------------------------------------------------------- Wound Assessment Details Patient Name: Date of Service: [name] 04/11/2019 3:00 PM Medical Record [id number] Patient Account Number: [id number] Date of Birth/Sex: Treating RN: 28-Aug-1945 (74 y.o. [name] Primary Care Aroura Vasudevan: BRAKE, ANDREW Other Clinician: Referring Aiyonna Lucado: Treating Charita Lindenberger/Extender:Stone III, Lyndee Hensen, [name] in Treatment: 13 Wound Status Wound Number: 1 Primary Etiology: Venous Leg Ulcer Wound Location: Right, Lateral Lower Leg Wound Status: Healed - Epithelialized Wounding Event: Trauma Date Acquired: 12/21/2018 Weeks Of Treatment: 13 Clustered Wound: No Photos Photo Uploaded By: Mikeal Hawthorne on 04/12/2019 11:37:39 Wound Measurements Length: (cm) 0 % Reduction Width: (cm) 0 % Reduction Depth: (cm) 0 Area: (cm) 0 Volume: (cm) 0 Wound Description Classification: Full Thickness Without Exposed Support Structures in Area: 100% in Volume: 100% Electronic Signature(s) Signed: 04/11/2019 4:31:34 PM By: Deon Pilling Entered By:  Deon Pilling on 04/11/2019 15:31:25 -------------------------------------------------------------------------------- Vitals Details Patient Name: Date of Service: [name] 04/11/2019 3:00 PM Medical Record [id number] Patient Account Number: [id number] Date of Birth/Sex: Treating RN: [birth year] (74 y.o. Elam Dutch Primary Care Shamone Winzer: BRAKE, Mitzi Hansen Other Clinician: Referring Nikolette Reindl: Treating Arabel Barcenas/Extender:Stone III, Lyndee Hensen, [name] in Treatment: 13 Vital Signs Time Taken: 15:28 Temperature (F): 98.6 Height (in): 62 Pulse (bpm): 75 Respiratory Rate (breaths/min): 18 Blood Pressure (mmHg): 144/52 Capillary Blood Glucose (mg/dl): 168 Reference Range: 80 - 120 mg / dl Electronic Signature(s) Signed: 05/09/2019 9:21:08 AM By: Sandre Kitty Entered By: Sandre Kitty on 04/11/2019 15:28:47

## 2019-05-09 NOTE — Progress Notes (Signed)
TOYA, PALACIOS ([id number]) Visit Report for 04/04/2019 Arrival Information Details Patient Name: Date of Service: [name], [name] 04/04/2019 3:00 PM Medical Record [id number] Patient Account Number: [id number] Date of Birth/Sex: Treating RN: 05-30-1945 (74 y.o. Elam Dutch Primary Care Alexander Mcauley: Jillyn Ledger Other Clinician: Referring Shanielle Correll: Treating Deona Novitski/Extender:Stone III, Lyndee Hensen, [name] in Treatment: 12 Visit Information History Since Last Visit Walker Added or deleted any medications: No Patient Arrived: Any new allergies or adverse reactions: No Arrival Time: 15:51 Had a fall or experienced change in No Accompanied By: son activities of daily living that may affect Transfer Assistance: None risk of falls: Patient Identification Verified: Yes Signs or symptoms of abuse/neglect since last No Secondary Verification Process Completed: Yes visito Patient Requires Transmission-Based No Hospitalized since last visit: No Precautions: Implantable device outside of the clinic excluding No Patient Has Alerts: No cellular tissue based products placed in the center since last visit: Has Dressing in Place as Prescribed: Yes Pain Present Now: No Electronic Signature(s) Signed: 05/09/2019 9:21:08 AM By: Sandre Kitty Entered By: Sandre Kitty on 04/04/2019 15:52:18 -------------------------------------------------------------------------------- Compression Therapy Details Patient Name: Date of Service: [name] 04/04/2019 3:00 PM Medical Record [id number] Patient Account Number: [id number] Date of Birth/Sex: Treating RN: March 24, 1945 (74 y.o. Elam Dutch Primary Care Giavonna Pflum: Jillyn Ledger Other Clinician: Referring Kohen Reither: Treating Krishana Lutze/Extender:Stone III, Lyndee Hensen, [name] in Treatment: 12 Compression Therapy Performed for Wound Wound #1 Right,Lateral Lower Leg Assessment: Performed By: Clinician  Carlene Coria, RN Compression Type: Three Layer Post Procedure Diagnosis Same as Pre-procedure Electronic Signature(s) Signed: 04/04/2019 5:43:44 PM By: Baruch Gouty RN, BSN Entered By: Baruch Gouty on 04/04/2019 16:25:34 -------------------------------------------------------------------------------- Encounter Discharge Information Details Patient Name: Date of Service: [name] 04/04/2019 3:00 PM Medical Record [id number] Patient Account Number: [id number] Date of Birth/Sex: Treating RN: 03/22/1945 (74 y.o. [name] Primary Care Kentravious Lipford: Jillyn Ledger Other Clinician: Referring Felipe Paluch: Treating Monaca Wadas/Extender:Stone III, Lyndee Hensen, [name] in Treatment: 12 Encounter Discharge Information Items Discharge Condition: Stable Ambulatory Status: Wheelchair Discharge Destination: Home Transportation: Private Auto Accompanied By: son Schedule Follow-up Appointment: Yes Clinical Summary of Care: Patient Declined Electronic Signature(s) Signed: 04/04/2019 5:35:23 PM By: Carlene Coria RN Entered By: Carlene Coria on 04/04/2019 16:42:08 -------------------------------------------------------------------------------- Lower Extremity Assessment Details Patient Name: Date of Service: FE, [place] 04/04/2019 3:00 PM Medical Record [id number] Patient Account Number: [id number] Date of Birth/Sex: Treating RN: Apr 24, 1945 (74 y.o. Elam Dutch Primary Care Karianne Nogueira: BRAKE, Mitzi Hansen Other Clinician: Referring Enes Wegener: Treating Rody Keadle/Extender:Stone III, Lyndee Hensen, [name] in Treatment: 12 Edema Assessment Assessed: [Left: No] [Right: No] Edema: [Left: Ye] [Right: s] Calf Left: Right: Point of Measurement: 28 cm From Medial Instep cm 36.4 cm Ankle Left: Right: Point of Measurement: 10 cm From Medial Instep cm 21.2 cm Vascular Assessment Pulses: Dorsalis Pedis Palpable: [Right:Yes] Electronic Signature(s) Signed: 04/04/2019  5:43:44 PM By: Baruch Gouty RN, BSN Entered By: Baruch Gouty on 04/04/2019 16:25:02 -------------------------------------------------------------------------------- Multi-Disciplinary Care Plan Details Patient Name: Date of Service: [name] 04/04/2019 3:00 PM Medical Record [id number] Patient Account Number: [id number] Date of Birth/Sex: Treating RN: 01-Dec-1945 (74 y.o. Elam Dutch Primary Care Shrihaan Porzio: Jillyn Ledger Other Clinician: Referring Kaina Orengo: Treating Madysyn Hanken/Extender:Stone III, Lyndee Hensen, [name] in Treatment: 12 Active Inactive Wound/Skin Impairment Nursing Diagnoses: Impaired tissue integrity Goals: Patient/caregiver will verbalize understanding of skin care regimen Date Initiated: 02/07/2019 Target Resolution Date: 05/02/2019 Goal Status: Active Ulcer/skin breakdown will have a volume reduction of 30% by week 4 Date Initiated: 01/05/2019 Date Inactivated:  02/07/2019 Target Resolution Date: 02/02/2019 Unmet Reason: hematoma Goal Status: Unmet unroofed Ulcer/skin breakdown will have a volume reduction of 50% by week 8 Date Initiated: 02/07/2019 Date Inactivated: 03/07/2019 Target Resolution Date: 03/07/2019 Goal Status: Met Ulcer/skin breakdown will have a volume reduction of 80% by week 12 Date Initiated: 03/07/2019 Date Inactivated: 04/04/2019 Target Resolution Date: 04/04/2019 Goal Status: Met Interventions: Provide education on smoking Provide education on ulcer and skin care Notes: Electronic Signature(s) Signed: 04/04/2019 5:43:44 PM By: Baruch Gouty RN, BSN Entered By: Baruch Gouty on 04/04/2019 16:22:04 -------------------------------------------------------------------------------- Pain Assessment Details Patient Name: Date of Service: [name] 04/04/2019 3:00 PM Medical Record [id number] Patient Account Number: [id number] Date of Birth/Sex: Treating RN: August 23, 1945 (74 y.o. Elam Dutch Primary  Care Renel Ende: Jillyn Ledger Other Clinician: Referring Jarrette Dehner: Treating Daneille Desilva/Extender:Stone III, Lyndee Hensen, [name] in Treatment: 12 Active Problems Location of Pain Severity and Description of Pain Patient Has Paino No Site Locations Pain Management and Medication Current Pain Management: Electronic Signature(s) Signed: 04/04/2019 5:43:44 PM By: Baruch Gouty RN, BSN Signed: 05/09/2019 9:21:08 AM By: Sandre Kitty Entered By: Sandre Kitty on 04/04/2019 15:52:25 -------------------------------------------------------------------------------- Patient/Caregiver Education Details Patient Name: [name]. Date of Service: 3/10/2021andnbsp3:00 PM Medical Record [id number] Patient Account Number: [id number] Date of Birth/Gender: August 21, 1945 (74 y.o. F) Treating RN: Baruch Gouty Primary Care Physician: Jillyn Ledger Other Clinician: Referring Physician: Treating Physician/Extender:Stone III, Lyndee Hensen, [name] in Treatment: 12 Education Assessment Education Provided To: Patient Education Topics Provided Wound/Skin Impairment: Methods: Explain/Verbal Responses: Reinforcements needed, State content correctly Electronic Signature(s) Signed: 04/04/2019 5:43:44 PM By: Baruch Gouty RN, BSN Entered By: Baruch Gouty on 04/04/2019 16:22:41 -------------------------------------------------------------------------------- Wound Assessment Details Patient Name: Date of Service: [name] 04/04/2019 3:00 PM Medical Record [id number] Patient Account Number: [id number] Date of Birth/Sex: Treating RN: 05/08/1945 (74 y.o. Elam Dutch Primary Care Nilo Fallin: BRAKE, Mitzi Hansen Other Clinician: Referring Laiah Pouncey: Treating Alverda Nazzaro/Extender:Stone III, Lyndee Hensen, [name] in Treatment: 12 Wound Status Wound Number: 1 Primary Venous Leg Ulcer Etiology: Wound Location: Right Lower Leg - Lateral Wound Open Wounding Event:  Trauma Status: Date Acquired: 12/21/2018 Comorbid Glaucoma, Chronic Obstructive Pulmonary Weeks Of Treatment: 12 History: Disease (COPD), Hypertension, Type II Clustered Wound: No Diabetes, Osteoarthritis, Osteomyelitis, Neuropathy, Confinement Anxiety Photos Wound Measurements Length: (cm) 0.1 % Reduct Width: (cm) 0.1 % Reduct Depth: (cm) 0.1 Epitheli Area: (cm) 0.008 Tunneli Volume: (cm) 0.001 Undermi Wound Description Classification: Full Thickness Without Exposed Support Foul Od Structures Slough/ Wound Flat and Intact Margin: Exudate None Present Amount: Wound Bed Granulation Amount: None Present (0%) Necrotic Amount: None Present (0%) Fascia E Fat Laye Tendon E Muscle E Joint Ex Bone Exp or After Cleansing: No Fibrino No Exposed Structure xposed: No r (Subcutaneous Tissue) Exposed: No xposed: No xposed: No posed: No osed: No ion in Area: 99.9% ion in Volume: 99.8% alization: Large (67-100%) ng: No ning: No Electronic Signature(s) Signed: 04/06/2019 4:47:30 PM By: Mikeal Hawthorne EMT/HBOT Signed: 04/06/2019 5:31:10 PM By: Baruch Gouty RN, BSN Previous Signature: 04/04/2019 5:43:44 PM Version By: Baruch Gouty RN, BSN Entered By: Mikeal Hawthorne on 04/06/2019 13:37:08 -------------------------------------------------------------------------------- Vitals Details Patient Name: Date of Service: [name]. 04/04/2019 3:00 PM Medical Record [id number] Patient Account Number: [id number] Date of Birth/Sex: Treating RN: 1945/03/17 (74 y.o. Elam Dutch Primary Care Carmelina Balducci: BRAKE, Mitzi Hansen Other Clinician: Referring Letticia Bhattacharyya: Treating Oluwafemi Villella/Extender:Stone III, Lyndee Hensen, [name] in Treatment: 12 Vital Signs Time Taken: 15:52 Temperature (F): 98.3 Height (in): 62 Pulse (bpm): 73 Respiratory Rate (breaths/min): 18 Blood  Pressure (mmHg): 117/42 Capillary Blood Glucose (mg/dl): 159 Reference Range: 80 - 120 mg /  dl Electronic Signature(s) Signed: 05/09/2019 9:21:08 AM By: Sandre Kitty Entered By: Sandre Kitty on 04/04/2019 15:54:36

## 2019-05-10 DIAGNOSIS — H401312 Pigmentary glaucoma, right eye, moderate stage: Secondary | ICD-10-CM | POA: Diagnosis not present

## 2019-06-04 DIAGNOSIS — F259 Schizoaffective disorder, unspecified: Secondary | ICD-10-CM | POA: Diagnosis not present

## 2019-06-04 DIAGNOSIS — F331 Major depressive disorder, recurrent, moderate: Secondary | ICD-10-CM | POA: Diagnosis not present

## 2019-06-04 DIAGNOSIS — F419 Anxiety disorder, unspecified: Secondary | ICD-10-CM | POA: Diagnosis not present

## 2019-07-19 ENCOUNTER — Encounter: Payer: Medicare Other | Attending: Family Medicine | Admitting: Dietician

## 2019-07-19 ENCOUNTER — Encounter: Payer: Self-pay | Admitting: Dietician

## 2019-07-19 DIAGNOSIS — E119 Type 2 diabetes mellitus without complications: Secondary | ICD-10-CM | POA: Insufficient documentation

## 2019-07-19 NOTE — Progress Notes (Signed)
Medical Nutrition Therapy:  Appt start time: 1400 end time:  2778. This visit was completed via telephone due to the COVID-19 pandemic.   I spoke with [name] and verified that I was speaking with the correct person with two patient identifiers (full name and date of birth).   I discussed the limitations related to this kind of visit and the patient is willing to proceed. Patient's son was also present.  Assessment:  Primary concerns today: I last spoke to her 04/20/2019. Patient reports a fall in April but states that she is now doing well.  She continues to use oxygen and smokes outside.  She continues to drink the [employer] drinks and her sone brings her some sugar free but she does not like these.  She uses a nicotine patch at times when she runs out of cigarettes. Her son requests ideas for lower carbohydrate snacks other than the Licorice and dried apricots. Blood glucose around 150 each am.  History includes polyneuropathy, hyperlipidemia, HTN, COPD, osteoporosis, smokes, toe amputation, dental issues and few teeth, and schizoaffective disorder. Medications include: Glipizide, basaglar 34units q hs, Metformin XR, Novolog  12 units before lunch, and 12 units before dinner. She does not take the breakfast dose as she skips this meal. Last known A1C 7.3% decreased from 10.1% 02/27/2018. She states that she had an A1C test yesterday but test results are pending.  Patient lives alone. She has 2 children nearby who are helpful, bring her food, bath her and help her with other tasks as needed. She moved here 12 years ago from [place] and is retired from the [employer]. She relies on [employer] or the city bus for transportation.Her sons are currently helping her with transportation. One son has type 2 diabetes.Dentition is poor and requires soft food.  Preferred Learning Style:   No preference indicated   Learning Readiness:   Ready  Change in  progress   MEDICATIONS: see list to include Glipizide, metformin, Basaglar 34 units q HS, and novolog 12 units before lunch and dinner (skips breakfast)   DIETARY INTAKE:  24-hr recall:  B ( AM): skips  Snk ( AM): none  L ( PM): boiled egg, yogurt, applesauce, spinach leaves, sandwich ([place] and cheese, tuna or chicken salad) Snk ( PM): dried apricots, black licorice D ( PM): Healthy Choice frozen meal (x2) or homemade soup Snk ( PM):  Beverages: water, coffee with sweet and low, [employer] Drink (2-3 daily), occasional diet coke  Usual physical activity: ADL's  Estimated energy needs: 1400 calories 70 g protein  Progress Towards Goal(s):  In progress.   Nutritional Diagnosis:  NB-1.1 Food and nutrition-related knowledge deficit As related to balance of carbohydrates, protein, and fat.  As evidenced by diet hx and patient report.    Intervention:  Nutrition education/counseling continued related to nutrition with meals and snacks.  She is not willing to decrease the [employer] drinks to 2 per day but is willing to keep at 2 drinks per day. Snack ideas discussed include low salt/low fat popcorn, rice cakes, cheese sticks (1-2 daily), diet jello.  Foods are limited due to her dentition.  She states that she does not get outside much so suggested that she speak with her MD about her vitamin D status or take a vitamin D supplement.  Teaching Method Utilized:  Auditory  Barriers to learning/adherence to lifestyle change: readiness to change, health status  Demonstrated degree of understanding via:  Teach Back   Monitoring/Evaluation:  Dietary intake, exercise, and body weight in 3 month(s).

## 2019-07-20 ENCOUNTER — Ambulatory Visit: Payer: Managed Care, Other (non HMO) | Admitting: Dietician

## 2019-07-25 DIAGNOSIS — I10 Essential (primary) hypertension: Secondary | ICD-10-CM | POA: Diagnosis not present

## 2019-07-25 DIAGNOSIS — E1142 Type 2 diabetes mellitus with diabetic polyneuropathy: Secondary | ICD-10-CM | POA: Diagnosis not present

## 2019-07-25 DIAGNOSIS — E782 Mixed hyperlipidemia: Secondary | ICD-10-CM | POA: Diagnosis not present

## 2019-07-25 DIAGNOSIS — R350 Frequency of micturition: Secondary | ICD-10-CM | POA: Diagnosis not present

## 2019-07-25 DIAGNOSIS — Z794 Long term (current) use of insulin: Secondary | ICD-10-CM | POA: Diagnosis not present

## 2019-07-25 DIAGNOSIS — F251 Schizoaffective disorder, depressive type: Secondary | ICD-10-CM | POA: Diagnosis not present

## 2019-07-25 DIAGNOSIS — J449 Chronic obstructive pulmonary disease, unspecified: Secondary | ICD-10-CM | POA: Diagnosis not present

## 2019-07-25 DIAGNOSIS — Z9981 Dependence on supplemental oxygen: Secondary | ICD-10-CM | POA: Diagnosis not present

## 2019-07-25 DIAGNOSIS — H409 Unspecified glaucoma: Secondary | ICD-10-CM | POA: Diagnosis not present

## 2019-07-25 DIAGNOSIS — G629 Polyneuropathy, unspecified: Secondary | ICD-10-CM | POA: Diagnosis not present

## 2019-07-25 DIAGNOSIS — Z0000 Encounter for general adult medical examination without abnormal findings: Secondary | ICD-10-CM | POA: Diagnosis not present

## 2019-08-23 DIAGNOSIS — B351 Tinea unguium: Secondary | ICD-10-CM | POA: Diagnosis not present

## 2019-08-28 DIAGNOSIS — F331 Major depressive disorder, recurrent, moderate: Secondary | ICD-10-CM | POA: Diagnosis not present

## 2019-08-28 DIAGNOSIS — F259 Schizoaffective disorder, unspecified: Secondary | ICD-10-CM | POA: Diagnosis not present

## 2019-08-28 DIAGNOSIS — F419 Anxiety disorder, unspecified: Secondary | ICD-10-CM | POA: Diagnosis not present

## 2019-08-31 DIAGNOSIS — B351 Tinea unguium: Secondary | ICD-10-CM | POA: Diagnosis not present

## 2019-08-31 DIAGNOSIS — L600 Ingrowing nail: Secondary | ICD-10-CM | POA: Diagnosis not present

## 2019-08-31 DIAGNOSIS — M79671 Pain in right foot: Secondary | ICD-10-CM | POA: Diagnosis not present

## 2019-08-31 DIAGNOSIS — M79672 Pain in left foot: Secondary | ICD-10-CM | POA: Diagnosis not present

## 2019-09-12 DIAGNOSIS — H401131 Primary open-angle glaucoma, bilateral, mild stage: Secondary | ICD-10-CM | POA: Diagnosis not present

## 2019-09-28 DIAGNOSIS — L600 Ingrowing nail: Secondary | ICD-10-CM | POA: Diagnosis not present

## 2019-09-28 DIAGNOSIS — M79674 Pain in right toe(s): Secondary | ICD-10-CM | POA: Diagnosis not present

## 2019-09-28 DIAGNOSIS — B351 Tinea unguium: Secondary | ICD-10-CM | POA: Diagnosis not present

## 2019-09-28 DIAGNOSIS — M79675 Pain in left toe(s): Secondary | ICD-10-CM | POA: Diagnosis not present

## 2019-09-29 ENCOUNTER — Other Ambulatory Visit: Payer: Self-pay

## 2019-09-29 ENCOUNTER — Emergency Department (HOSPITAL_COMMUNITY)
Admission: EM | Admit: 2019-09-29 | Discharge: 2019-09-29 | Disposition: A | Discharge status: Home / Self Care | Payer: Medicare Other | Attending: Emergency Medicine | Admitting: Emergency Medicine

## 2019-09-29 ENCOUNTER — Encounter (HOSPITAL_COMMUNITY): Payer: Self-pay

## 2019-09-29 ENCOUNTER — Emergency Department (HOSPITAL_COMMUNITY): Payer: Medicare Other

## 2019-09-29 DIAGNOSIS — Z794 Long term (current) use of insulin: Secondary | ICD-10-CM | POA: Diagnosis not present

## 2019-09-29 DIAGNOSIS — J449 Chronic obstructive pulmonary disease, unspecified: Secondary | ICD-10-CM | POA: Insufficient documentation

## 2019-09-29 DIAGNOSIS — M79671 Pain in right foot: Secondary | ICD-10-CM | POA: Diagnosis present

## 2019-09-29 DIAGNOSIS — S99921A Unspecified injury of right foot, initial encounter: Secondary | ICD-10-CM | POA: Diagnosis not present

## 2019-09-29 DIAGNOSIS — Y939 Activity, unspecified: Secondary | ICD-10-CM | POA: Diagnosis not present

## 2019-09-29 DIAGNOSIS — I10 Essential (primary) hypertension: Secondary | ICD-10-CM | POA: Insufficient documentation

## 2019-09-29 DIAGNOSIS — S90121A Contusion of right lesser toe(s) without damage to nail, initial encounter: Secondary | ICD-10-CM | POA: Insufficient documentation

## 2019-09-29 DIAGNOSIS — Y9289 Other specified places as the place of occurrence of the external cause: Secondary | ICD-10-CM | POA: Diagnosis not present

## 2019-09-29 DIAGNOSIS — M19071 Primary osteoarthritis, right ankle and foot: Secondary | ICD-10-CM | POA: Diagnosis not present

## 2019-09-29 DIAGNOSIS — Y999 Unspecified external cause status: Secondary | ICD-10-CM | POA: Insufficient documentation

## 2019-09-29 DIAGNOSIS — R238 Other skin changes: Secondary | ICD-10-CM | POA: Diagnosis not present

## 2019-09-29 DIAGNOSIS — X58XXXA Exposure to other specified factors, initial encounter: Secondary | ICD-10-CM | POA: Insufficient documentation

## 2019-09-29 DIAGNOSIS — E1142 Type 2 diabetes mellitus with diabetic polyneuropathy: Secondary | ICD-10-CM | POA: Diagnosis not present

## 2019-09-29 DIAGNOSIS — F17210 Nicotine dependence, cigarettes, uncomplicated: Secondary | ICD-10-CM | POA: Diagnosis not present

## 2019-09-29 DIAGNOSIS — Z7982 Long term (current) use of aspirin: Secondary | ICD-10-CM | POA: Diagnosis not present

## 2019-09-29 DIAGNOSIS — Z79899 Other long term (current) drug therapy: Secondary | ICD-10-CM | POA: Insufficient documentation

## 2019-09-29 MED ORDER — TRAMADOL HCL 50 MG PO TABS: 50.0000 mg | ORAL_TABLET | Freq: Four times a day (QID) | ORAL | 0 refills | Status: AC | PRN

## 2019-09-29 NOTE — ED Provider Notes (Signed)
[facility] DEPT Provider Note   CSN: [id number] Arrival date & time: 09/29/19  1921     History Chief Complaint  Patient presents with  . Foot Pain    [name] is a 74 y.o. female with history of COPD on supplemental oxygen 3 L via nonrebreather, presents to the ER for evaluation of right toe pain.  States she was walking last night trying to turn off her lights when she tripped over some cables and fell onto a carpeted floor.  She has pain associated with bruising in the right fourth toe and is afraid she has a fracture.  Took Motrin through the night but states the pain was no better.  Has been ambulatory with some pain.  Denies any other injuries after the fall.  Denies hitting her head, losing consciousness.  She was able to stand up on her own by pulling herself up on the nearby furniture.  She is not on any anticoagulants.  She lives alone but states both of her sons come check on her daily.  Has been feeling well and at her baseline the last few days.  She has no other complaints.  Of note, triage vital signs showed hypoxia 86% while on her oxygen.  Patient states she has not had any respiratory issues in the last few days.  Denies fever, cough, chest pain, shortness of breath.  She has not had to use increased oxygen on her NRB.  States she is on oxygen for her advanced COPD.  She prefers a nonrebreather instead of a nasal cannula.  Denies any significant leg swelling, cough, orthopnea.  HPI     Past Medical History:  Diagnosis Date  . Anxiety   . Arthritis   . Cataract   . COPD (chronic obstructive pulmonary disease) ([place])   . Depression   . Diabetes mellitus without complication ([place])   . Glaucoma   . Headache    "migraines im my 20's"  . Hyperlipidemia   . Hypertension   . Neuromuscular disorder ([place])   . Osteomyelitis ([place])    right second toe  . Osteoporosis   . Schizophrenia ([place])    schizoaffective  . Substance abuse [facility])       Patient Active Problem List   Diagnosis Date Noted  . Pain in right shoulder 08/02/2018  . Idiopathic chronic venous hypertension of both lower extremities with inflammation 06/07/2017  . Ulcer of toe of left foot, limited to breakdown of skin ([place]) 06/07/2017  . History of amputation of lesser toe of right foot ([place]) 11/05/2016  . Diabetic polyneuropathy associated with type 2 diabetes mellitus ([place]) 10/05/2016  . Fracture of one rib, left side, initial encounter for closed fracture 01/22/2016  . 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 COPD mixed type ([place]) 09/18/2015  . FREQUENCY, URINARY 07/10/2007  . 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 Smoker 02/23/2007  . Essential hypertension 02/23/2007  . DIAB W/O MENTION COMP TYPE II/UNS TYPE UNCNTRL 01/20/2007  . HYPERLIPIDEMIA 01/20/2007  . HEADACHE 01/20/2007    Past Surgical History:  Procedure Laterality Date  . AMPUTATION Right 10/29/2016   Procedure: Right Foot 2nd Toe Amputation at Metatarsophalangeal Joint;  Surgeon: Newt Minion, MD;  Location: [facility];  Service: Orthopedics;  Laterality: Right;  . EYE SURGERY    . KNEE SURGERY    . MULTIPLE TOOTH EXTRACTIONS    . VASCULAR SURGERY     vein surgery     OB History  No obstetric history on file.     Family History  Problem Relation Age of Onset  . Heart disease Father     Social History   Tobacco Use  . Smoking status: Current Every Day Smoker    Packs/day: 2.00    Years: 53.00    Pack years: 106.00    Types: Cigarettes  . Smokeless tobacco: Never Used  Vaping Use  . Vaping Use: Never used  Substance Use Topics  . Alcohol use: No    Alcohol/week: 0.0 standard drinks  . Drug use: No    Home Medications Prior to Admission medications   Medication Sig Start Date End Date Taking? Authorizing Provider  albuterol (PROVENTIL HFA;VENTOLIN HFA) 108 (90 Base) MCG/ACT inhaler Inhale 2 puffs into the lungs every 6 (six) hours as needed for  wheezing or shortness of breath.    [provider]  amLODipine (NORVASC) 10 MG tablet Take 10 mg by mouth daily.    [provider]  aspirin EC 81 MG tablet Take 81 mg by mouth daily.    [provider]  atorvastatin (LIPITOR) 20 MG tablet Take 20 mg by mouth daily.    [provider]  BD PEN NEEDLE NANO U/F 32G X 4 MM MISC  12/23/15   [provider]  budesonide-formoterol (SYMBICORT) 160-4.5 MCG/ACT inhaler Inhale 2 puffs into the lungs 2 (two) times daily.    [provider]  cephALEXin (KEFLEX) 500 MG capsule Take 1 capsule (500 mg total) by mouth 4 (four) times daily. 01/04/19   Sally Leigh, MD  chlorproMAZINE (THORAZINE) 100 MG tablet Take 100 mg by mouth at bedtime.     [provider]  doxycycline (VIBRAMYCIN) 100 MG capsule Take 1 capsule (100 mg total) by mouth 2 (two) times daily. 10/22/17   Wurst, [place], PA-C  doxycycline (VIBRAMYCIN) 100 MG capsule Take 1 capsule (100 mg total) by mouth 2 (two) times daily. 01/04/19   Sally Leigh, MD  glipiZIDE (GLUCOTROL XL) 10 MG 24 hr tablet Take 10 mg by mouth daily. 01/04/18   [provider]  hydrOXYzine (ATARAX/VISTARIL) 10 MG tablet Take 10 mg by mouth 2 (two) times daily.     [provider]  ibuprofen (ADVIL,MOTRIN) 400 MG tablet Take 1 tablet (400 mg total) by mouth every 6 (six) hours as needed for moderate pain. 10/22/17   Wurst, [place], PA-C  Insulin Glargine (BASAGLAR KWIKPEN) 100 UNIT/ML SOPN Inject 35 Units into the skin at bedtime.     [provider]  levofloxacin (LEVAQUIN) 500 MG tablet Take 1 tablet (500 mg total) by mouth daily. Patient not taking: Reported on 01/04/2019 04/10/18   Sally Muskrat, MD  LUMIGAN 0.01 % SOLN Place 1 drop into both eyes at bedtime.  11/09/18   [provider]  metFORMIN (GLUCOPHAGE) 1000 MG tablet Take 1,000 mg by mouth 2 (two) times daily with a meal.     [provider]  metFORMIN  (GLUCOPHAGE-XR) 750 MG 24 hr tablet Take 750 mg by mouth daily with breakfast.    [provider]  morphine (MSIR) 15 MG tablet Take 0.5 tablets (7.5 mg total) by mouth every 6 (six) hours as needed for moderate pain or severe pain. Patient not taking: Reported on 01/04/2019 02/27/18   Davonna Belling, MD  Multiple Vitamin (MULTIVITAMIN WITH MINERALS) TABS tablet Take 1 tablet by mouth daily.    [provider]  NOVOLOG FLEXPEN 100 UNIT/ML FlexPen Inject 12-15 Units into the skin 3 (three)  times daily with meals.  11/15/15   [provider]  pregabalin (LYRICA) 100 MG capsule Take 100 mg by mouth 3 (three) times daily.    [provider]  sertraline (ZOLOFT) 100 MG tablet Take 100 mg by mouth daily.    [provider]  topiramate (TOPAMAX) 50 MG tablet Take 50 mg by mouth daily.  01/03/16   [provider]  traMADol (ULTRAM) 50 MG tablet Take 1 tablet (50 mg total) by mouth every 6 (six) hours as needed for up to 3 days. 09/29/19 10/02/19  Kinnie Feil, PA-C  valsartan (DIOVAN) 160 MG tablet Take 160 mg by mouth daily.    [provider]  vitamin C (ASCORBIC ACID) 500 MG tablet Take 500 mg by mouth daily as needed (cold prevention).    [provider]    Allergies    Sitagliptin phosphate  Review of Systems   Review of Systems  Musculoskeletal: Positive for arthralgias and gait problem.  Skin: Positive for color change.  All other systems reviewed and are negative.   Physical Exam Updated Vital Signs BP (!) 143/75 (BP Location: Right Arm)   Pulse 75   Temp 98 F (36.7 C) (Oral)   Resp 20   Ht 5\' 2"  (1.575 m)   Wt 92.5 kg   SpO2 100%   BMI 37.31 kg/m   Physical Exam Vitals and nursing note reviewed.  Constitutional:      General: She is not in acute distress.    Appearance: She is well-developed.     Comments: NAD.  HENT:     Head: Normocephalic and atraumatic.     Right Ear: External ear normal.      Left Ear: External ear normal.     Nose: Nose normal.  Eyes:     Conjunctiva/sclera: Conjunctivae normal.  Cardiovascular:     Rate and Rhythm: Normal rate and regular rhythm.     Heart sounds: Normal heart sounds.     Comments: Minimal/trace pitting edema distal lower extremities, symmetric.  Patient reports chronic mild swelling in her legs and this is not new for her.  No calf tenderness.  1+ DP pulses bilaterally Pulmonary:     Effort: Pulmonary effort is normal.     Breath sounds: Normal breath sounds.     Comments: SPO2 is greater than 92% on 3 L.  Patient is on a nonrebreather that she brought from home.  States she prefers NRB over nasal cannula.  Speaking in full sentences.  I personally ambulated patient around her bed and SPO2 did not drop below 92% while on 3 L.  Lungs clear.  Diminished air sounds in lower lobes, exam limited due to body habitus.  No wheezing or crackles. Musculoskeletal:        General: No deformity. Normal range of motion.     Cervical back: Normal range of motion and neck supple.     Comments: Right fourth toe is diffusely tender and ecchymotic.  No other associated focal bony tenderness of the right foot, ankle.  Skin:    General: Skin is warm and dry.     Capillary Refill: Capillary refill takes less than 2 seconds.  Neurological:     Mental Status: She is alert and oriented to person, place, and time.  Psychiatric:        Behavior: Behavior normal.        Thought Content: Thought content normal.        Judgment: Judgment normal.  ED Results / Procedures / Treatments   Labs (all labs ordered are listed, but only abnormal results are displayed) Labs Reviewed - No data to display  EKG None  Radiology DG Foot Complete Right  Result Date: 09/29/2019 CLINICAL DATA:  Third through fifth toe pain after striking them on floor lamp. EXAM: RIGHT FOOT COMPLETE - 3+ VIEW COMPARISON:  None. FINDINGS: The second toe is absent. No evidence of acute  fracture, particularly of the third through fifth digits. No bony destruction or periosteal reaction. Osteoarthritis of the first metatarsal phalangeal joint. Mild osteoarthritis of the midfoot. There is a moderate plantar calcaneal spur and small Achilles tendon enthesophyte. No focal soft tissue abnormality. IMPRESSION: 1. No acute fracture of the right foot. 2. 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 Electronically Signed   By: Keith Rake M.D.   On: 09/29/2019 20:27    Procedures Procedures (including critical care time)  Medications Ordered in ED Medications - No data to display  ED Course  I have reviewed the triage vital signs and the nursing notes.  Pertinent labs & imaging results that were available during my care of the patient were reviewed by me and considered in my medical decision making (see chart for details).    MDM Rules/Calculators/A&P                          74 year old female presents for traumatic right fourth toe pain after a mechanical fall last night.  Exam overall reassuring.  X-ray obtained in triage negative for fracture.  I personally ambulated patient and she had steady gait around the bed without significant difficulty.  Gait was steady.  Will discharge with pain medicine, ice, rest, elevation.  Of note, triage documented SPO2 of 86% on her usual oxygen supplementation.  Patient has no respiratory complaints.  No fever, cough, shortness of breath, chest pain.  Has chronic peripheral edema unchanged.  Pulmonary exam benign.  I personally ambulated patient on her bed and she was not hypoxic on her usual 3 L.  She chooses to use a nonrebreather instead of a nasal cannula.  Given normal SPO2 I do not think further emergent work-up is necessary for this.  Return precautions discussed with patient.  She is comfortable with discharge.  Final Clinical Impression(s) / ED Diagnoses Final diagnoses:  Contusion of lesser toe of right foot  without damage to nail, initial encounter    Rx / DC Orders ED Discharge Orders         Ordered    traMADol (ULTRAM) 50 MG tablet  Every 6 hours PRN        09/29/19 2252           Kinnie Feil, PA-C 09/29/19 2252    Sally Leigh, MD 09/30/19 1646

## 2019-09-29 NOTE — ED Triage Notes (Signed)
Pt reports pain on her R last 3 toes after hitting them on a floor lamp while she was trying to turn off the lights last night. Pt reports that she is on 3L of O2 with a simple face mask all of the time. O2 is at 86%. She does not appear to be struggling to breathe and is speaking in complete sentences.

## 2019-09-29 NOTE — Discharge Instructions (Signed)
You were seen in the ED for right toe pain  X-ray did not show any fracture.  It is possible that there is a small hairline fracture that did not show up on x-rays today.  If your pain continues for more than 7 to 10 days, please call your primary care doctor make an appointment and you may need a repeat x-ray  Take Motrin for mild to moderate pain.  I have prescribed tramadol for more severe breakthrough pain.  Elevate your foot.  Ice.  Your oxygen was noted to be low once here in triage but after rechecking your oxygen was normal.  Return for fever, cough, chest pain, shortness of breath, increased leg swelling

## 2019-10-10 DIAGNOSIS — E782 Mixed hyperlipidemia: Secondary | ICD-10-CM | POA: Diagnosis not present

## 2019-10-10 DIAGNOSIS — E1142 Type 2 diabetes mellitus with diabetic polyneuropathy: Secondary | ICD-10-CM | POA: Diagnosis not present

## 2019-10-10 DIAGNOSIS — J449 Chronic obstructive pulmonary disease, unspecified: Secondary | ICD-10-CM | POA: Diagnosis not present

## 2019-10-10 DIAGNOSIS — R296 Repeated falls: Secondary | ICD-10-CM | POA: Diagnosis not present

## 2019-10-10 DIAGNOSIS — Z794 Long term (current) use of insulin: Secondary | ICD-10-CM | POA: Diagnosis not present

## 2019-10-10 DIAGNOSIS — I10 Essential (primary) hypertension: Secondary | ICD-10-CM | POA: Diagnosis not present

## 2019-10-10 DIAGNOSIS — F251 Schizoaffective disorder, depressive type: Secondary | ICD-10-CM | POA: Diagnosis not present

## 2019-10-10 DIAGNOSIS — G8929 Other chronic pain: Secondary | ICD-10-CM | POA: Diagnosis not present

## 2019-10-14 DIAGNOSIS — Z9181 History of falling: Secondary | ICD-10-CM | POA: Diagnosis not present

## 2019-10-14 DIAGNOSIS — Z7951 Long term (current) use of inhaled steroids: Secondary | ICD-10-CM | POA: Diagnosis not present

## 2019-10-14 DIAGNOSIS — I10 Essential (primary) hypertension: Secondary | ICD-10-CM | POA: Diagnosis not present

## 2019-10-14 DIAGNOSIS — F17210 Nicotine dependence, cigarettes, uncomplicated: Secondary | ICD-10-CM | POA: Diagnosis not present

## 2019-10-14 DIAGNOSIS — K5900 Constipation, unspecified: Secondary | ICD-10-CM | POA: Diagnosis not present

## 2019-10-14 DIAGNOSIS — G8929 Other chronic pain: Secondary | ICD-10-CM | POA: Diagnosis not present

## 2019-10-14 DIAGNOSIS — F251 Schizoaffective disorder, depressive type: Secondary | ICD-10-CM | POA: Diagnosis not present

## 2019-10-14 DIAGNOSIS — E1142 Type 2 diabetes mellitus with diabetic polyneuropathy: Secondary | ICD-10-CM | POA: Diagnosis not present

## 2019-10-14 DIAGNOSIS — Z89429 Acquired absence of other toe(s), unspecified side: Secondary | ICD-10-CM | POA: Diagnosis not present

## 2019-10-14 DIAGNOSIS — Z794 Long term (current) use of insulin: Secondary | ICD-10-CM | POA: Diagnosis not present

## 2019-10-14 DIAGNOSIS — J449 Chronic obstructive pulmonary disease, unspecified: Secondary | ICD-10-CM | POA: Diagnosis not present

## 2019-10-14 DIAGNOSIS — Z9981 Dependence on supplemental oxygen: Secondary | ICD-10-CM | POA: Diagnosis not present

## 2019-10-14 DIAGNOSIS — Z6838 Body mass index (BMI) 38.0-38.9, adult: Secondary | ICD-10-CM | POA: Diagnosis not present

## 2019-10-14 DIAGNOSIS — E782 Mixed hyperlipidemia: Secondary | ICD-10-CM | POA: Diagnosis not present

## 2019-10-14 DIAGNOSIS — H409 Unspecified glaucoma: Secondary | ICD-10-CM | POA: Diagnosis not present

## 2019-10-15 DIAGNOSIS — J449 Chronic obstructive pulmonary disease, unspecified: Secondary | ICD-10-CM | POA: Diagnosis not present

## 2019-10-15 DIAGNOSIS — F251 Schizoaffective disorder, depressive type: Secondary | ICD-10-CM | POA: Diagnosis not present

## 2019-10-15 DIAGNOSIS — I10 Essential (primary) hypertension: Secondary | ICD-10-CM | POA: Diagnosis not present

## 2019-10-15 DIAGNOSIS — E1142 Type 2 diabetes mellitus with diabetic polyneuropathy: Secondary | ICD-10-CM | POA: Diagnosis not present

## 2019-10-15 DIAGNOSIS — F17210 Nicotine dependence, cigarettes, uncomplicated: Secondary | ICD-10-CM | POA: Diagnosis not present

## 2019-10-17 DIAGNOSIS — J449 Chronic obstructive pulmonary disease, unspecified: Secondary | ICD-10-CM | POA: Diagnosis not present

## 2019-10-17 DIAGNOSIS — E1142 Type 2 diabetes mellitus with diabetic polyneuropathy: Secondary | ICD-10-CM | POA: Diagnosis not present

## 2019-10-17 DIAGNOSIS — F17210 Nicotine dependence, cigarettes, uncomplicated: Secondary | ICD-10-CM | POA: Diagnosis not present

## 2019-10-17 DIAGNOSIS — F251 Schizoaffective disorder, depressive type: Secondary | ICD-10-CM | POA: Diagnosis not present

## 2019-10-17 DIAGNOSIS — I10 Essential (primary) hypertension: Secondary | ICD-10-CM | POA: Diagnosis not present

## 2019-10-18 ENCOUNTER — Encounter: Payer: Medicare Other | Attending: Family Medicine | Admitting: Dietician

## 2019-10-18 ENCOUNTER — Encounter: Payer: Self-pay | Admitting: Dietician

## 2019-10-18 DIAGNOSIS — E1169 Type 2 diabetes mellitus with other specified complication: Secondary | ICD-10-CM | POA: Insufficient documentation

## 2019-10-18 DIAGNOSIS — J449 Chronic obstructive pulmonary disease, unspecified: Secondary | ICD-10-CM | POA: Diagnosis not present

## 2019-10-18 DIAGNOSIS — F251 Schizoaffective disorder, depressive type: Secondary | ICD-10-CM | POA: Diagnosis not present

## 2019-10-18 DIAGNOSIS — Z794 Long term (current) use of insulin: Secondary | ICD-10-CM | POA: Diagnosis not present

## 2019-10-18 DIAGNOSIS — F17210 Nicotine dependence, cigarettes, uncomplicated: Secondary | ICD-10-CM | POA: Diagnosis not present

## 2019-10-18 DIAGNOSIS — I10 Essential (primary) hypertension: Secondary | ICD-10-CM | POA: Diagnosis not present

## 2019-10-18 DIAGNOSIS — E1142 Type 2 diabetes mellitus with diabetic polyneuropathy: Secondary | ICD-10-CM | POA: Diagnosis not present

## 2019-10-18 NOTE — Progress Notes (Signed)
Medical Nutrition Therapy:  Appt start time: 1400 end time:  8250. This visit was completed via telephone due to the COVID-19 pandemic.   I spoke with [name] and verified that I was speaking with the correct person with two patient identifiers (full name and date of birth).   I discussed the limitations related to this kind of visit and the patient is willing to proceed.  Assessment:  Primary concerns today: I last spoke to her 07/19/2019. Patient fell again re injuring her shoulder and cutting her foot.  She states that she has not had much of an appetite.  She has not eaten yet today.  Son usually comes and prepares lunch but patient eats the same thing each lunch and that she can prepare this.  She continues to use oxygen and smokes outside.  She states that her son limits the number of cigarettes that he will bring.  She continues to drink the [employer] drinks and her sone brings her some sugar free but she does not like these.  She uses a nicotine patch at times when she runs out of cigarettes.  Blood glucose around 120-150 each am which has decreased.  History includes polyneuropathy, hyperlipidemia, HTN, COPD, osteoporosis, smokes, toe amputation, dental issues and few teeth, and schizoaffective disorder. Medications include: Glipizide, basaglar 34units q hs, Metformin XR, Novolog  10 units before breakfast (usually skips breakfast but drinks a regular [employer] Drink), 15 units before lunch, and 15 units before dinner.  Last known A1C 7.3% decreased from 10.1% 02/27/2018.  Patient lives alone. She has 2 children nearby who are helpful, bring her food, bath her and help her with other tasks such as transportation as needed. She moved here 12 years ago from [place] and is retired from the [employer]. One son has type 2 diabetes.Dentition is poor and requires soft food.  Preferred Learning Style:   No preference indicated   Learning Readiness:    Ready  Change in progress   MEDICATIONS: see list to include Glipizide, metformin, Basaglar 34 units q HS, and novolog 12 units before lunch and dinner (skips breakfast)   DIETARY INTAKE:  24-hr recall:  B ( AM): skips  Snk ( AM): none  L ( PM): boiled egg, yogurt, applesauce, spinach leaves, sandwich ([place] and cheese, tuna or chicken salad) Snk ( PM): dried apricots, black licorice D ( PM): Healthy Choice frozen meal (x2) or homemade soup Snk ( PM):  Beverages: water, coffee with sweet and low, [employer] Drink (2-3 daily), occasional diet coke  Usual physical activity: ADL's  Estimated energy needs: 1400 calories 70 g protein  Progress Towards Goal(s):  In progress.   Nutritional Diagnosis:  NB-1.1 Food and nutrition-related knowledge deficit As related to balance of carbohydrates, protein, and fat.  As evidenced by diet hx and patient report.    Intervention:  Nutrition education/counseling continued related to nutrition with meals and snacks.  She is not willing to decrease her intake of the [employer]. Snack ideas include low salt/low fat popcorn, rice cakes, cheese sticks (1-2 daily), diet jello.  Foods are limited due to her dentition.  She states that she does not get outside much so suggested that she speak with her MD about her vitamin D status or take a vitamin D supplement at last visit.  Teaching Method Utilized:  Auditory  Barriers to learning/adherence to lifestyle change: readiness to change, health status  Demonstrated degree of understanding via:  Teach Back  Monitoring/Evaluation:  Dietary intake, exercise, and body weight in 3 month(s).

## 2019-10-19 DIAGNOSIS — E1142 Type 2 diabetes mellitus with diabetic polyneuropathy: Secondary | ICD-10-CM | POA: Diagnosis not present

## 2019-10-19 DIAGNOSIS — F251 Schizoaffective disorder, depressive type: Secondary | ICD-10-CM | POA: Diagnosis not present

## 2019-10-19 DIAGNOSIS — F17210 Nicotine dependence, cigarettes, uncomplicated: Secondary | ICD-10-CM | POA: Diagnosis not present

## 2019-10-19 DIAGNOSIS — I10 Essential (primary) hypertension: Secondary | ICD-10-CM | POA: Diagnosis not present

## 2019-10-19 DIAGNOSIS — J449 Chronic obstructive pulmonary disease, unspecified: Secondary | ICD-10-CM | POA: Diagnosis not present

## 2019-10-23 DIAGNOSIS — E1142 Type 2 diabetes mellitus with diabetic polyneuropathy: Secondary | ICD-10-CM | POA: Diagnosis not present

## 2019-10-23 DIAGNOSIS — J449 Chronic obstructive pulmonary disease, unspecified: Secondary | ICD-10-CM | POA: Diagnosis not present

## 2019-10-23 DIAGNOSIS — F17210 Nicotine dependence, cigarettes, uncomplicated: Secondary | ICD-10-CM | POA: Diagnosis not present

## 2019-10-23 DIAGNOSIS — F251 Schizoaffective disorder, depressive type: Secondary | ICD-10-CM | POA: Diagnosis not present

## 2019-10-23 DIAGNOSIS — I10 Essential (primary) hypertension: Secondary | ICD-10-CM | POA: Diagnosis not present

## 2019-10-24 DIAGNOSIS — F17210 Nicotine dependence, cigarettes, uncomplicated: Secondary | ICD-10-CM | POA: Diagnosis not present

## 2019-10-24 DIAGNOSIS — I10 Essential (primary) hypertension: Secondary | ICD-10-CM | POA: Diagnosis not present

## 2019-10-24 DIAGNOSIS — F251 Schizoaffective disorder, depressive type: Secondary | ICD-10-CM | POA: Diagnosis not present

## 2019-10-24 DIAGNOSIS — E1142 Type 2 diabetes mellitus with diabetic polyneuropathy: Secondary | ICD-10-CM | POA: Diagnosis not present

## 2019-10-24 DIAGNOSIS — J449 Chronic obstructive pulmonary disease, unspecified: Secondary | ICD-10-CM | POA: Diagnosis not present

## 2019-10-26 DIAGNOSIS — L600 Ingrowing nail: Secondary | ICD-10-CM | POA: Diagnosis not present

## 2019-10-26 DIAGNOSIS — B351 Tinea unguium: Secondary | ICD-10-CM | POA: Diagnosis not present

## 2019-10-26 DIAGNOSIS — M79675 Pain in left toe(s): Secondary | ICD-10-CM | POA: Diagnosis not present

## 2019-10-26 DIAGNOSIS — M79672 Pain in left foot: Secondary | ICD-10-CM | POA: Diagnosis not present

## 2019-10-26 DIAGNOSIS — R600 Localized edema: Secondary | ICD-10-CM | POA: Diagnosis not present

## 2019-10-26 DIAGNOSIS — R21 Rash and other nonspecific skin eruption: Secondary | ICD-10-CM | POA: Diagnosis not present

## 2019-10-29 DIAGNOSIS — F17210 Nicotine dependence, cigarettes, uncomplicated: Secondary | ICD-10-CM | POA: Diagnosis not present

## 2019-10-29 DIAGNOSIS — E1142 Type 2 diabetes mellitus with diabetic polyneuropathy: Secondary | ICD-10-CM | POA: Diagnosis not present

## 2019-10-29 DIAGNOSIS — F251 Schizoaffective disorder, depressive type: Secondary | ICD-10-CM | POA: Diagnosis not present

## 2019-10-29 DIAGNOSIS — I10 Essential (primary) hypertension: Secondary | ICD-10-CM | POA: Diagnosis not present

## 2019-10-29 DIAGNOSIS — J449 Chronic obstructive pulmonary disease, unspecified: Secondary | ICD-10-CM | POA: Diagnosis not present

## 2019-10-30 DIAGNOSIS — I10 Essential (primary) hypertension: Secondary | ICD-10-CM | POA: Diagnosis not present

## 2019-10-30 DIAGNOSIS — F17210 Nicotine dependence, cigarettes, uncomplicated: Secondary | ICD-10-CM | POA: Diagnosis not present

## 2019-10-30 DIAGNOSIS — E1142 Type 2 diabetes mellitus with diabetic polyneuropathy: Secondary | ICD-10-CM | POA: Diagnosis not present

## 2019-10-30 DIAGNOSIS — F251 Schizoaffective disorder, depressive type: Secondary | ICD-10-CM | POA: Diagnosis not present

## 2019-10-30 DIAGNOSIS — J449 Chronic obstructive pulmonary disease, unspecified: Secondary | ICD-10-CM | POA: Diagnosis not present

## 2019-11-02 DIAGNOSIS — F251 Schizoaffective disorder, depressive type: Secondary | ICD-10-CM | POA: Diagnosis not present

## 2019-11-02 DIAGNOSIS — F17210 Nicotine dependence, cigarettes, uncomplicated: Secondary | ICD-10-CM | POA: Diagnosis not present

## 2019-11-02 DIAGNOSIS — I10 Essential (primary) hypertension: Secondary | ICD-10-CM | POA: Diagnosis not present

## 2019-11-02 DIAGNOSIS — E1142 Type 2 diabetes mellitus with diabetic polyneuropathy: Secondary | ICD-10-CM | POA: Diagnosis not present

## 2019-11-02 DIAGNOSIS — J449 Chronic obstructive pulmonary disease, unspecified: Secondary | ICD-10-CM | POA: Diagnosis not present

## 2019-11-06 DIAGNOSIS — F17210 Nicotine dependence, cigarettes, uncomplicated: Secondary | ICD-10-CM | POA: Diagnosis not present

## 2019-11-06 DIAGNOSIS — I10 Essential (primary) hypertension: Secondary | ICD-10-CM | POA: Diagnosis not present

## 2019-11-06 DIAGNOSIS — F251 Schizoaffective disorder, depressive type: Secondary | ICD-10-CM | POA: Diagnosis not present

## 2019-11-06 DIAGNOSIS — E1142 Type 2 diabetes mellitus with diabetic polyneuropathy: Secondary | ICD-10-CM | POA: Diagnosis not present

## 2019-11-06 DIAGNOSIS — J449 Chronic obstructive pulmonary disease, unspecified: Secondary | ICD-10-CM | POA: Diagnosis not present

## 2019-11-09 DIAGNOSIS — F251 Schizoaffective disorder, depressive type: Secondary | ICD-10-CM | POA: Diagnosis not present

## 2019-11-09 DIAGNOSIS — I10 Essential (primary) hypertension: Secondary | ICD-10-CM | POA: Diagnosis not present

## 2019-11-09 DIAGNOSIS — J449 Chronic obstructive pulmonary disease, unspecified: Secondary | ICD-10-CM | POA: Diagnosis not present

## 2019-11-09 DIAGNOSIS — F17210 Nicotine dependence, cigarettes, uncomplicated: Secondary | ICD-10-CM | POA: Diagnosis not present

## 2019-11-09 DIAGNOSIS — E1142 Type 2 diabetes mellitus with diabetic polyneuropathy: Secondary | ICD-10-CM | POA: Diagnosis not present

## 2019-11-12 DIAGNOSIS — F17210 Nicotine dependence, cigarettes, uncomplicated: Secondary | ICD-10-CM | POA: Diagnosis not present

## 2019-11-12 DIAGNOSIS — E1142 Type 2 diabetes mellitus with diabetic polyneuropathy: Secondary | ICD-10-CM | POA: Diagnosis not present

## 2019-11-12 DIAGNOSIS — F251 Schizoaffective disorder, depressive type: Secondary | ICD-10-CM | POA: Diagnosis not present

## 2019-11-12 DIAGNOSIS — J449 Chronic obstructive pulmonary disease, unspecified: Secondary | ICD-10-CM | POA: Diagnosis not present

## 2019-11-12 DIAGNOSIS — I10 Essential (primary) hypertension: Secondary | ICD-10-CM | POA: Diagnosis not present

## 2019-11-13 DIAGNOSIS — Z794 Long term (current) use of insulin: Secondary | ICD-10-CM | POA: Diagnosis not present

## 2019-11-13 DIAGNOSIS — E782 Mixed hyperlipidemia: Secondary | ICD-10-CM | POA: Diagnosis not present

## 2019-11-13 DIAGNOSIS — Z9181 History of falling: Secondary | ICD-10-CM | POA: Diagnosis not present

## 2019-11-13 DIAGNOSIS — J449 Chronic obstructive pulmonary disease, unspecified: Secondary | ICD-10-CM | POA: Diagnosis not present

## 2019-11-13 DIAGNOSIS — Z9981 Dependence on supplemental oxygen: Secondary | ICD-10-CM | POA: Diagnosis not present

## 2019-11-13 DIAGNOSIS — F251 Schizoaffective disorder, depressive type: Secondary | ICD-10-CM | POA: Diagnosis not present

## 2019-11-13 DIAGNOSIS — E1142 Type 2 diabetes mellitus with diabetic polyneuropathy: Secondary | ICD-10-CM | POA: Diagnosis not present

## 2019-11-13 DIAGNOSIS — I10 Essential (primary) hypertension: Secondary | ICD-10-CM | POA: Diagnosis not present

## 2019-11-13 DIAGNOSIS — F17210 Nicotine dependence, cigarettes, uncomplicated: Secondary | ICD-10-CM | POA: Diagnosis not present

## 2019-11-13 DIAGNOSIS — G8929 Other chronic pain: Secondary | ICD-10-CM | POA: Diagnosis not present

## 2019-11-13 DIAGNOSIS — H409 Unspecified glaucoma: Secondary | ICD-10-CM | POA: Diagnosis not present

## 2019-11-13 DIAGNOSIS — Z6838 Body mass index (BMI) 38.0-38.9, adult: Secondary | ICD-10-CM | POA: Diagnosis not present

## 2019-11-13 DIAGNOSIS — Z7951 Long term (current) use of inhaled steroids: Secondary | ICD-10-CM | POA: Diagnosis not present

## 2019-11-13 DIAGNOSIS — Z89429 Acquired absence of other toe(s), unspecified side: Secondary | ICD-10-CM | POA: Diagnosis not present

## 2019-11-13 DIAGNOSIS — K5900 Constipation, unspecified: Secondary | ICD-10-CM | POA: Diagnosis not present

## 2019-11-21 DIAGNOSIS — F251 Schizoaffective disorder, depressive type: Secondary | ICD-10-CM | POA: Diagnosis not present

## 2019-11-21 DIAGNOSIS — J449 Chronic obstructive pulmonary disease, unspecified: Secondary | ICD-10-CM | POA: Diagnosis not present

## 2019-11-21 DIAGNOSIS — F17210 Nicotine dependence, cigarettes, uncomplicated: Secondary | ICD-10-CM | POA: Diagnosis not present

## 2019-11-21 DIAGNOSIS — E1142 Type 2 diabetes mellitus with diabetic polyneuropathy: Secondary | ICD-10-CM | POA: Diagnosis not present

## 2019-11-21 DIAGNOSIS — I10 Essential (primary) hypertension: Secondary | ICD-10-CM | POA: Diagnosis not present

## 2019-11-23 DIAGNOSIS — M79674 Pain in right toe(s): Secondary | ICD-10-CM | POA: Diagnosis not present

## 2019-11-23 DIAGNOSIS — L600 Ingrowing nail: Secondary | ICD-10-CM | POA: Diagnosis not present

## 2019-11-23 DIAGNOSIS — M79675 Pain in left toe(s): Secondary | ICD-10-CM | POA: Diagnosis not present

## 2019-11-23 DIAGNOSIS — B351 Tinea unguium: Secondary | ICD-10-CM | POA: Diagnosis not present

## 2019-11-23 DIAGNOSIS — Z23 Encounter for immunization: Secondary | ICD-10-CM | POA: Diagnosis not present

## 2019-11-27 DIAGNOSIS — F331 Major depressive disorder, recurrent, moderate: Secondary | ICD-10-CM | POA: Diagnosis not present

## 2019-11-27 DIAGNOSIS — F419 Anxiety disorder, unspecified: Secondary | ICD-10-CM | POA: Diagnosis not present

## 2019-11-27 DIAGNOSIS — F259 Schizoaffective disorder, unspecified: Secondary | ICD-10-CM | POA: Diagnosis not present

## 2019-11-28 DIAGNOSIS — J449 Chronic obstructive pulmonary disease, unspecified: Secondary | ICD-10-CM | POA: Diagnosis not present

## 2019-11-28 DIAGNOSIS — F251 Schizoaffective disorder, depressive type: Secondary | ICD-10-CM | POA: Diagnosis not present

## 2019-11-28 DIAGNOSIS — E1142 Type 2 diabetes mellitus with diabetic polyneuropathy: Secondary | ICD-10-CM | POA: Diagnosis not present

## 2019-11-28 DIAGNOSIS — F17210 Nicotine dependence, cigarettes, uncomplicated: Secondary | ICD-10-CM | POA: Diagnosis not present

## 2019-11-28 DIAGNOSIS — I10 Essential (primary) hypertension: Secondary | ICD-10-CM | POA: Diagnosis not present

## 2019-12-06 DIAGNOSIS — F251 Schizoaffective disorder, depressive type: Secondary | ICD-10-CM | POA: Diagnosis not present

## 2019-12-06 DIAGNOSIS — J449 Chronic obstructive pulmonary disease, unspecified: Secondary | ICD-10-CM | POA: Diagnosis not present

## 2019-12-06 DIAGNOSIS — F17210 Nicotine dependence, cigarettes, uncomplicated: Secondary | ICD-10-CM | POA: Diagnosis not present

## 2019-12-06 DIAGNOSIS — I10 Essential (primary) hypertension: Secondary | ICD-10-CM | POA: Diagnosis not present

## 2019-12-06 DIAGNOSIS — E1142 Type 2 diabetes mellitus with diabetic polyneuropathy: Secondary | ICD-10-CM | POA: Diagnosis not present

## 2019-12-12 DIAGNOSIS — E1142 Type 2 diabetes mellitus with diabetic polyneuropathy: Secondary | ICD-10-CM | POA: Diagnosis not present

## 2019-12-12 DIAGNOSIS — I10 Essential (primary) hypertension: Secondary | ICD-10-CM | POA: Diagnosis not present

## 2019-12-12 DIAGNOSIS — F17210 Nicotine dependence, cigarettes, uncomplicated: Secondary | ICD-10-CM | POA: Diagnosis not present

## 2019-12-12 DIAGNOSIS — J449 Chronic obstructive pulmonary disease, unspecified: Secondary | ICD-10-CM | POA: Diagnosis not present

## 2019-12-12 DIAGNOSIS — F251 Schizoaffective disorder, depressive type: Secondary | ICD-10-CM | POA: Diagnosis not present

## 2020-01-10 ENCOUNTER — Encounter: Payer: Self-pay | Admitting: Dietician

## 2020-01-10 ENCOUNTER — Other Ambulatory Visit: Payer: Self-pay

## 2020-01-10 ENCOUNTER — Encounter: Payer: Medicare Other | Attending: Family Medicine | Admitting: Dietician

## 2020-01-10 DIAGNOSIS — E118 Type 2 diabetes mellitus with unspecified complications: Secondary | ICD-10-CM | POA: Insufficient documentation

## 2020-01-10 NOTE — Patient Instructions (Signed)
Continue to wean the cigarettes and vaping. What can you do when you are stressed rather than smoking or snacking?

## 2020-01-10 NOTE — Progress Notes (Signed)
Medical Nutrition Therapy:  Appt start time: 1440 end time:  1500.  Assessment:  Primary concerns today:  Patient is here today with her son Haze Justin.  He assists with much of her care.   Her other son, baths her and stated that she may need to pay for care to help with her bathing.  She refuses to go to assisted living. Patient fell again in November injuring her leg.  She uses oxygen and a walker. She continues to smoke and uses 8 cigarettes per day along with e-cigarettes and a nicotine patch.  Discussed the concerns about using the nicotine patch as well as vaping and smoking.  Son stated that the patch dose was not high enough to cause toxicity with current nicotine use.   She states that she saves up cigarettes on some days to smoke more on others.  Expressed concerns about this on other visits and continued to work with patient to decrease use. She continues to drink 3 regular [employer] drinks daily.  She dislikes and refuses diet. She continues to snack on licorice and apricots.  Son's tried rice cakes but patient did not like these. States that she did not like cut apples and could not chew them.  History includes polyneuropathy, hyperlipidemia, HTN, COPD, osteoporosis, smokes, toe amputation, dental issues and few teeth, and schizoaffective disorder. Medications include: Glipizide, basaglar 34units q hs, Metformin XR, Novolog  10 units before breakfast (usually skips breakfast but drinks a regular [employer] Drink), 15 units before lunch, and 15 units before dinner.  Last known A1C 7.3% decreased from 10.1% 02/27/2018.  Patient lives alone. She has 2 children nearby who are helpful, bring her food, bath her and help her with other tasks such as transportation as needed. She moved here 12 years ago from [place] and is retired from the [employer]. One son has type 2 diabetes.Dentition is poor and requires soft food.  Preferred Learning Style:   No preference  indicated   Learning Readiness:   Ready  Change in progress   MEDICATIONS: see list to include Glipizide, metformin, Basaglar 34 units q HS, and novolog 12 units before lunch and dinner (skips breakfast)   DIETARY INTAKE:  24-hr recall:  B ( AM): skips  Snk ( AM): none  L ( PM): boiled egg, yogurt, applesauce, spinach leaves, sandwich ([place] and cheese, tuna or chicken salad) Snk ( PM): dried apricots, black licorice D ( PM): Healthy Choice frozen meal (x2) or homemade soup Snk ( PM):  Beverages: water, coffee with sweet and low, [employer]ink (2-3 daily), occasional diet coke  Usual physical activity: ADL's  Estimated energy needs: 1400 calories 70 g protein  Progress Towards Goal(s):  In progress.   Nutritional Diagnosis:  NB-1.1 Food and nutrition-related knowledge deficit As related to balance of carbohydrates, protein, and fat.  As evidenced by diet hx and patient report.    Intervention:  Nutrition education/counseling continued related to nutrition with meals and snacks.  She is not willing to decrease her intake of the [employer]. Snack ideas include low salt/low fat popcorn, rice cakes, cheese sticks (1-2 daily), diet jello.  Foods are limited due to her dentition.  She states that she does not get outside much so suggested that she speak with her MD about her vitamin D status or take a vitamin D supplement at last visit.  Teaching Method Utilized:  Auditory  Barriers to learning/adherence to lifestyle change: readiness to change, health status  Demonstrated degree of understanding via:  Teach Back   Monitoring/Evaluation:  Dietary intake, exercise, and body weight in 2 month(s).

## 2020-02-11 IMAGING — CR DG SHOULDER 2+V*R*
2 series · 2 of 2 positions shown · non-contrast
Comparison: None.

CLINICAL DATA: Fall.

EXAM:
RIGHT SHOULDER - 2+ VIEW

[t shoulder internal right]
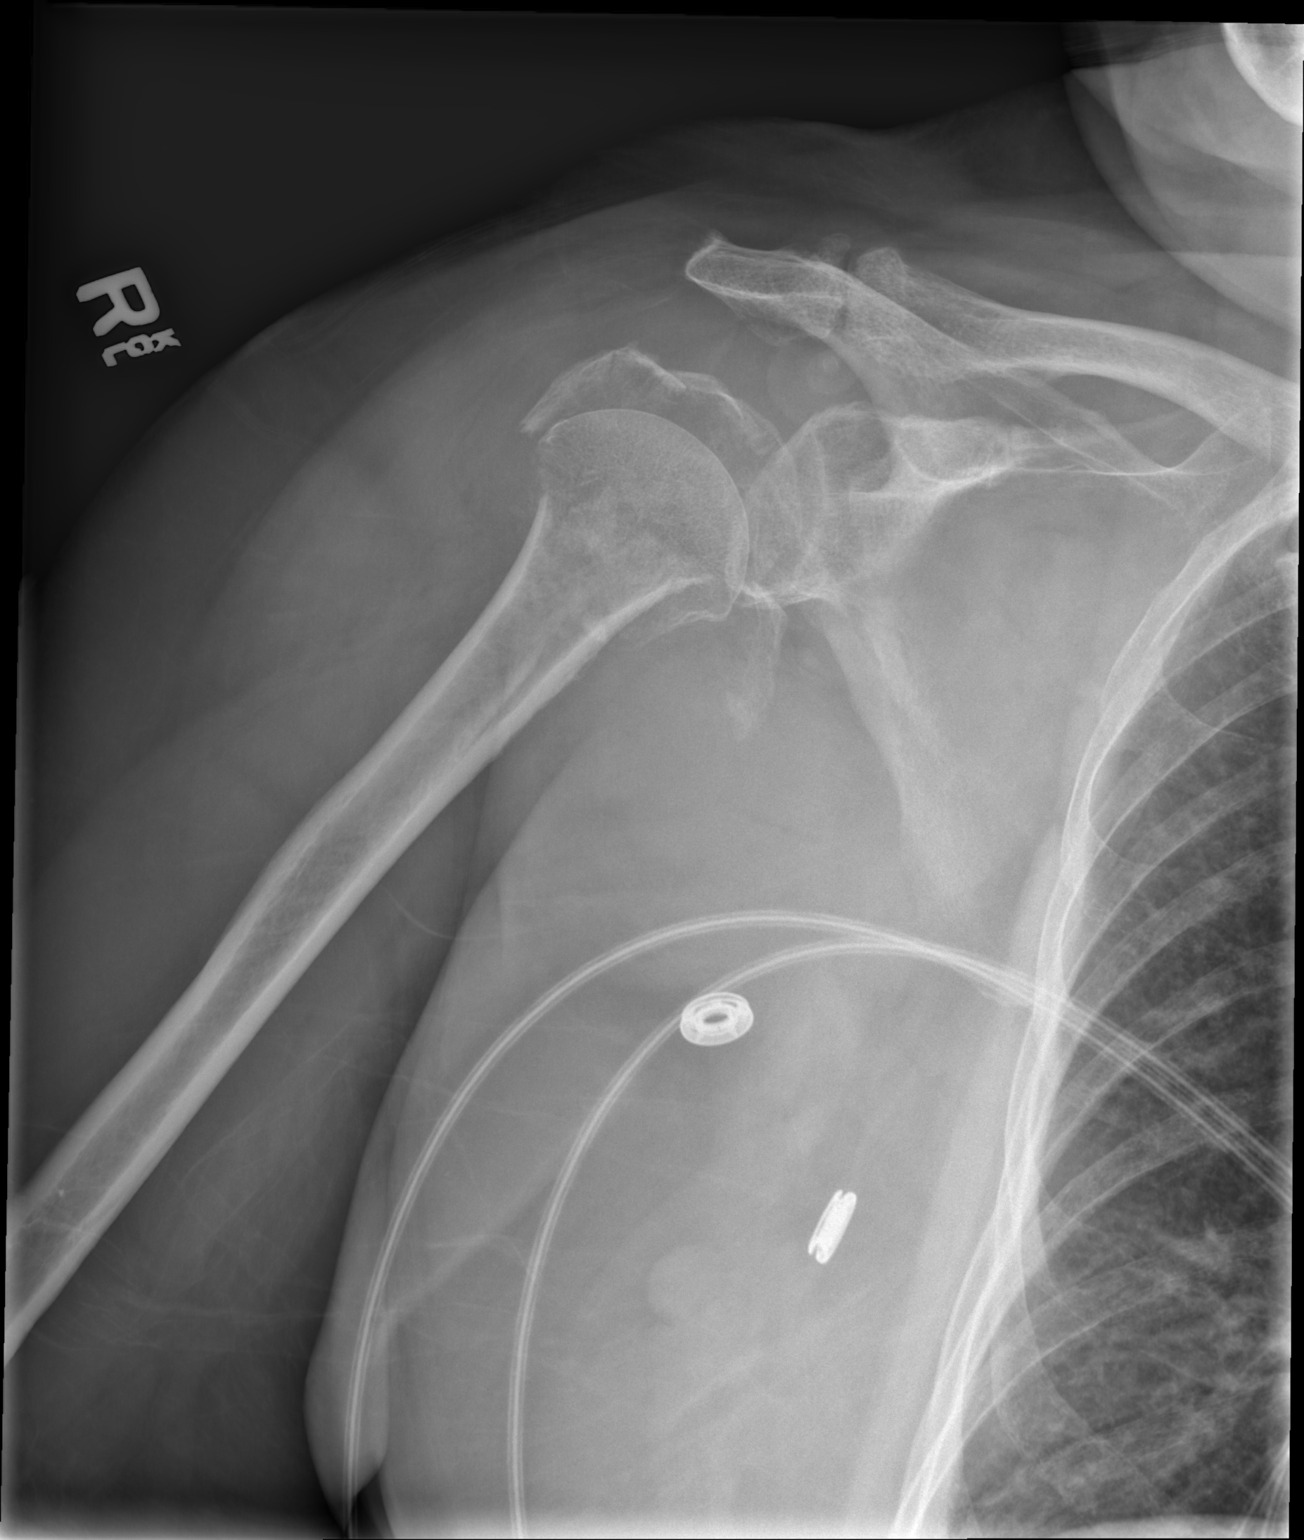

[t shoulder y-view right]
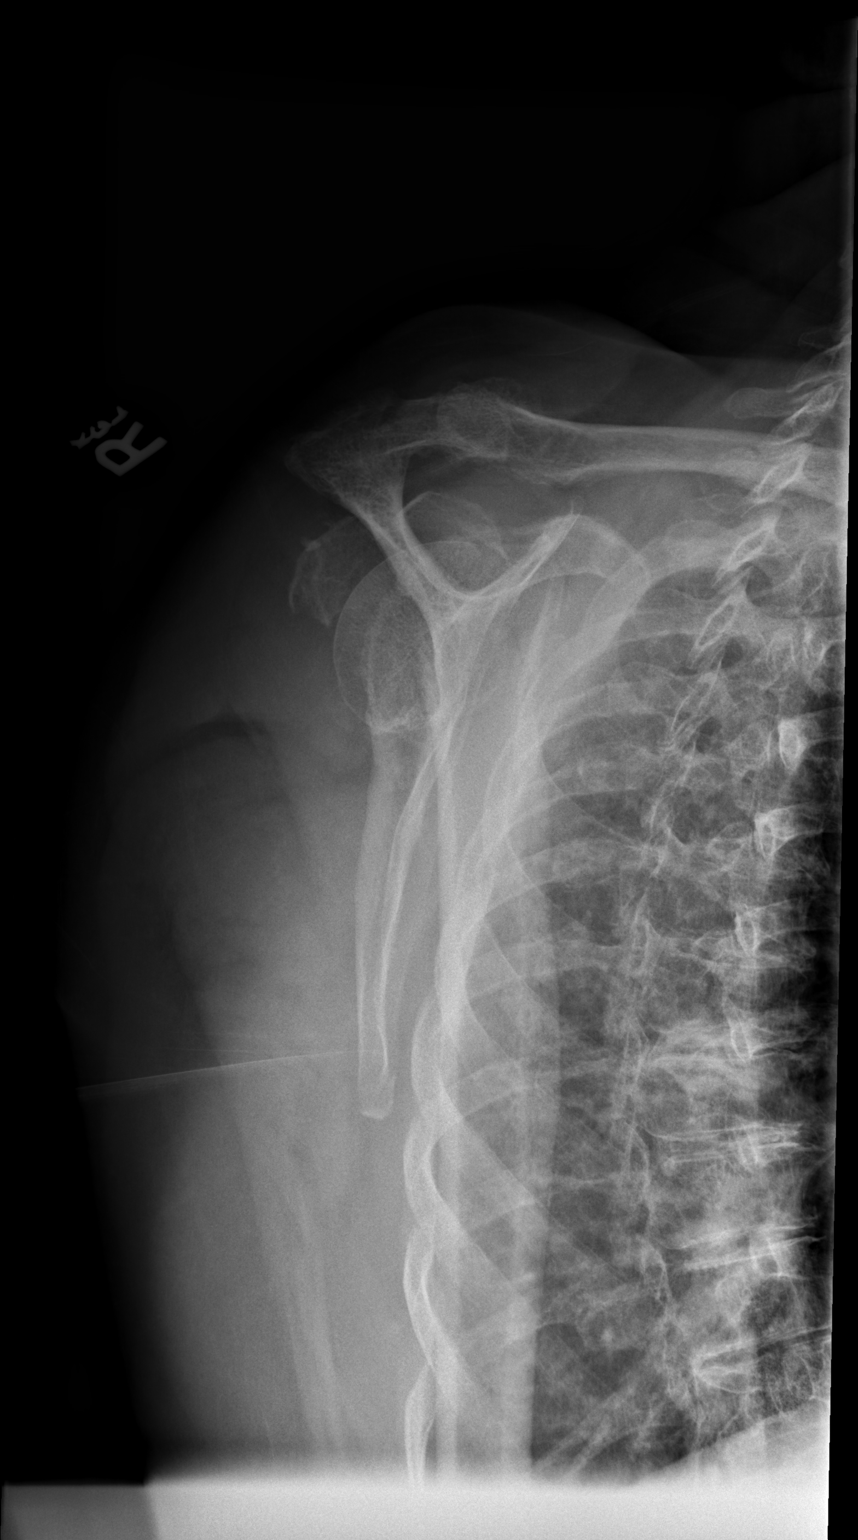

[2 of 2 positions shown; findings below may reference images not displayed]

FINDINGS: Comminuted, displaced, impacted fracture of the proximal humerus
involving the surgical neck and greater tuberosity. No dislocation.
Moderate acromioclavicular osteoarthritis. Osteopenia. Soft tissues
are unremarkable.
IMPRESSION: 1. Displaced and impacted complex fracture of the proximal humerus.

## 2020-02-21 DIAGNOSIS — Z9981 Dependence on supplemental oxygen: Secondary | ICD-10-CM | POA: Diagnosis not present

## 2020-02-21 DIAGNOSIS — F251 Schizoaffective disorder, depressive type: Secondary | ICD-10-CM | POA: Diagnosis not present

## 2020-02-21 DIAGNOSIS — J449 Chronic obstructive pulmonary disease, unspecified: Secondary | ICD-10-CM | POA: Diagnosis not present

## 2020-02-21 DIAGNOSIS — I10 Essential (primary) hypertension: Secondary | ICD-10-CM | POA: Diagnosis not present

## 2020-02-21 DIAGNOSIS — E1142 Type 2 diabetes mellitus with diabetic polyneuropathy: Secondary | ICD-10-CM | POA: Diagnosis not present

## 2020-02-21 DIAGNOSIS — K5900 Constipation, unspecified: Secondary | ICD-10-CM | POA: Diagnosis not present

## 2020-02-21 DIAGNOSIS — G8929 Other chronic pain: Secondary | ICD-10-CM | POA: Diagnosis not present

## 2020-02-21 DIAGNOSIS — Z794 Long term (current) use of insulin: Secondary | ICD-10-CM | POA: Diagnosis not present

## 2020-02-22 DIAGNOSIS — F419 Anxiety disorder, unspecified: Secondary | ICD-10-CM | POA: Diagnosis not present

## 2020-02-22 DIAGNOSIS — F259 Schizoaffective disorder, unspecified: Secondary | ICD-10-CM | POA: Diagnosis not present

## 2020-02-22 DIAGNOSIS — F331 Major depressive disorder, recurrent, moderate: Secondary | ICD-10-CM | POA: Diagnosis not present

## 2020-02-24 DIAGNOSIS — I10 Essential (primary) hypertension: Secondary | ICD-10-CM | POA: Diagnosis not present

## 2020-02-24 DIAGNOSIS — Z9981 Dependence on supplemental oxygen: Secondary | ICD-10-CM | POA: Diagnosis not present

## 2020-02-24 DIAGNOSIS — R159 Full incontinence of feces: Secondary | ICD-10-CM | POA: Diagnosis not present

## 2020-02-24 DIAGNOSIS — J9621 Acute and chronic respiratory failure with hypoxia: Secondary | ICD-10-CM | POA: Diagnosis not present

## 2020-02-24 DIAGNOSIS — Z794 Long term (current) use of insulin: Secondary | ICD-10-CM | POA: Diagnosis not present

## 2020-02-24 DIAGNOSIS — E1142 Type 2 diabetes mellitus with diabetic polyneuropathy: Secondary | ICD-10-CM | POA: Diagnosis not present

## 2020-02-24 DIAGNOSIS — Z741 Need for assistance with personal care: Secondary | ICD-10-CM | POA: Diagnosis not present

## 2020-02-24 DIAGNOSIS — Z6841 Body Mass Index (BMI) 40.0 and over, adult: Secondary | ICD-10-CM | POA: Diagnosis not present

## 2020-02-24 DIAGNOSIS — S4291XS Fracture of right shoulder girdle, part unspecified, sequela: Secondary | ICD-10-CM | POA: Diagnosis not present

## 2020-02-24 DIAGNOSIS — J449 Chronic obstructive pulmonary disease, unspecified: Secondary | ICD-10-CM | POA: Diagnosis not present

## 2020-02-24 DIAGNOSIS — F32A Depression, unspecified: Secondary | ICD-10-CM | POA: Diagnosis not present

## 2020-02-24 DIAGNOSIS — H409 Unspecified glaucoma: Secondary | ICD-10-CM | POA: Diagnosis not present

## 2020-02-24 DIAGNOSIS — R32 Unspecified urinary incontinence: Secondary | ICD-10-CM | POA: Diagnosis not present

## 2020-02-24 DIAGNOSIS — F17210 Nicotine dependence, cigarettes, uncomplicated: Secondary | ICD-10-CM | POA: Diagnosis not present

## 2020-02-25 DIAGNOSIS — Z6841 Body Mass Index (BMI) 40.0 and over, adult: Secondary | ICD-10-CM | POA: Diagnosis not present

## 2020-02-25 DIAGNOSIS — J449 Chronic obstructive pulmonary disease, unspecified: Secondary | ICD-10-CM | POA: Diagnosis not present

## 2020-02-25 DIAGNOSIS — F17210 Nicotine dependence, cigarettes, uncomplicated: Secondary | ICD-10-CM | POA: Diagnosis not present

## 2020-02-25 DIAGNOSIS — E1142 Type 2 diabetes mellitus with diabetic polyneuropathy: Secondary | ICD-10-CM | POA: Diagnosis not present

## 2020-02-25 DIAGNOSIS — J9621 Acute and chronic respiratory failure with hypoxia: Secondary | ICD-10-CM | POA: Diagnosis not present

## 2020-02-26 DIAGNOSIS — Z6841 Body Mass Index (BMI) 40.0 and over, adult: Secondary | ICD-10-CM | POA: Diagnosis not present

## 2020-02-26 DIAGNOSIS — R159 Full incontinence of feces: Secondary | ICD-10-CM | POA: Diagnosis not present

## 2020-02-26 DIAGNOSIS — Z9981 Dependence on supplemental oxygen: Secondary | ICD-10-CM | POA: Diagnosis not present

## 2020-02-26 DIAGNOSIS — H409 Unspecified glaucoma: Secondary | ICD-10-CM | POA: Diagnosis not present

## 2020-02-26 DIAGNOSIS — Z741 Need for assistance with personal care: Secondary | ICD-10-CM | POA: Diagnosis not present

## 2020-02-26 DIAGNOSIS — S4291XS Fracture of right shoulder girdle, part unspecified, sequela: Secondary | ICD-10-CM | POA: Diagnosis not present

## 2020-02-26 DIAGNOSIS — J9621 Acute and chronic respiratory failure with hypoxia: Secondary | ICD-10-CM | POA: Diagnosis not present

## 2020-02-26 DIAGNOSIS — F17210 Nicotine dependence, cigarettes, uncomplicated: Secondary | ICD-10-CM | POA: Diagnosis not present

## 2020-02-26 DIAGNOSIS — I10 Essential (primary) hypertension: Secondary | ICD-10-CM | POA: Diagnosis not present

## 2020-02-26 DIAGNOSIS — Z794 Long term (current) use of insulin: Secondary | ICD-10-CM | POA: Diagnosis not present

## 2020-02-26 DIAGNOSIS — R32 Unspecified urinary incontinence: Secondary | ICD-10-CM | POA: Diagnosis not present

## 2020-02-26 DIAGNOSIS — F32A Depression, unspecified: Secondary | ICD-10-CM | POA: Diagnosis not present

## 2020-02-26 DIAGNOSIS — J449 Chronic obstructive pulmonary disease, unspecified: Secondary | ICD-10-CM | POA: Diagnosis not present

## 2020-02-26 DIAGNOSIS — E1142 Type 2 diabetes mellitus with diabetic polyneuropathy: Secondary | ICD-10-CM | POA: Diagnosis not present

## 2020-02-27 DIAGNOSIS — J449 Chronic obstructive pulmonary disease, unspecified: Secondary | ICD-10-CM | POA: Diagnosis not present

## 2020-02-27 DIAGNOSIS — E1142 Type 2 diabetes mellitus with diabetic polyneuropathy: Secondary | ICD-10-CM | POA: Diagnosis not present

## 2020-02-27 DIAGNOSIS — F17210 Nicotine dependence, cigarettes, uncomplicated: Secondary | ICD-10-CM | POA: Diagnosis not present

## 2020-02-27 DIAGNOSIS — Z6841 Body Mass Index (BMI) 40.0 and over, adult: Secondary | ICD-10-CM | POA: Diagnosis not present

## 2020-02-27 DIAGNOSIS — J9621 Acute and chronic respiratory failure with hypoxia: Secondary | ICD-10-CM | POA: Diagnosis not present

## 2020-02-29 DIAGNOSIS — F17210 Nicotine dependence, cigarettes, uncomplicated: Secondary | ICD-10-CM | POA: Diagnosis not present

## 2020-02-29 DIAGNOSIS — J449 Chronic obstructive pulmonary disease, unspecified: Secondary | ICD-10-CM | POA: Diagnosis not present

## 2020-02-29 DIAGNOSIS — E1142 Type 2 diabetes mellitus with diabetic polyneuropathy: Secondary | ICD-10-CM | POA: Diagnosis not present

## 2020-02-29 DIAGNOSIS — Z6841 Body Mass Index (BMI) 40.0 and over, adult: Secondary | ICD-10-CM | POA: Diagnosis not present

## 2020-02-29 DIAGNOSIS — J9621 Acute and chronic respiratory failure with hypoxia: Secondary | ICD-10-CM | POA: Diagnosis not present

## 2020-03-04 DIAGNOSIS — J449 Chronic obstructive pulmonary disease, unspecified: Secondary | ICD-10-CM | POA: Diagnosis not present

## 2020-03-04 DIAGNOSIS — E1142 Type 2 diabetes mellitus with diabetic polyneuropathy: Secondary | ICD-10-CM | POA: Diagnosis not present

## 2020-03-04 DIAGNOSIS — F17210 Nicotine dependence, cigarettes, uncomplicated: Secondary | ICD-10-CM | POA: Diagnosis not present

## 2020-03-04 DIAGNOSIS — J9621 Acute and chronic respiratory failure with hypoxia: Secondary | ICD-10-CM | POA: Diagnosis not present

## 2020-03-04 DIAGNOSIS — Z6841 Body Mass Index (BMI) 40.0 and over, adult: Secondary | ICD-10-CM | POA: Diagnosis not present

## 2020-03-05 DIAGNOSIS — Z6841 Body Mass Index (BMI) 40.0 and over, adult: Secondary | ICD-10-CM | POA: Diagnosis not present

## 2020-03-05 DIAGNOSIS — E1142 Type 2 diabetes mellitus with diabetic polyneuropathy: Secondary | ICD-10-CM | POA: Diagnosis not present

## 2020-03-05 DIAGNOSIS — J449 Chronic obstructive pulmonary disease, unspecified: Secondary | ICD-10-CM | POA: Diagnosis not present

## 2020-03-05 DIAGNOSIS — J9621 Acute and chronic respiratory failure with hypoxia: Secondary | ICD-10-CM | POA: Diagnosis not present

## 2020-03-05 DIAGNOSIS — F17210 Nicotine dependence, cigarettes, uncomplicated: Secondary | ICD-10-CM | POA: Diagnosis not present

## 2020-03-06 DIAGNOSIS — Z6841 Body Mass Index (BMI) 40.0 and over, adult: Secondary | ICD-10-CM | POA: Diagnosis not present

## 2020-03-06 DIAGNOSIS — J9621 Acute and chronic respiratory failure with hypoxia: Secondary | ICD-10-CM | POA: Diagnosis not present

## 2020-03-06 DIAGNOSIS — J449 Chronic obstructive pulmonary disease, unspecified: Secondary | ICD-10-CM | POA: Diagnosis not present

## 2020-03-06 DIAGNOSIS — E1142 Type 2 diabetes mellitus with diabetic polyneuropathy: Secondary | ICD-10-CM | POA: Diagnosis not present

## 2020-03-06 DIAGNOSIS — F17210 Nicotine dependence, cigarettes, uncomplicated: Secondary | ICD-10-CM | POA: Diagnosis not present

## 2020-03-07 DIAGNOSIS — J449 Chronic obstructive pulmonary disease, unspecified: Secondary | ICD-10-CM | POA: Diagnosis not present

## 2020-03-07 DIAGNOSIS — Z6841 Body Mass Index (BMI) 40.0 and over, adult: Secondary | ICD-10-CM | POA: Diagnosis not present

## 2020-03-07 DIAGNOSIS — J9621 Acute and chronic respiratory failure with hypoxia: Secondary | ICD-10-CM | POA: Diagnosis not present

## 2020-03-07 DIAGNOSIS — F17210 Nicotine dependence, cigarettes, uncomplicated: Secondary | ICD-10-CM | POA: Diagnosis not present

## 2020-03-07 DIAGNOSIS — E1142 Type 2 diabetes mellitus with diabetic polyneuropathy: Secondary | ICD-10-CM | POA: Diagnosis not present

## 2020-03-11 DIAGNOSIS — F17210 Nicotine dependence, cigarettes, uncomplicated: Secondary | ICD-10-CM | POA: Diagnosis not present

## 2020-03-11 DIAGNOSIS — J449 Chronic obstructive pulmonary disease, unspecified: Secondary | ICD-10-CM | POA: Diagnosis not present

## 2020-03-11 DIAGNOSIS — E1142 Type 2 diabetes mellitus with diabetic polyneuropathy: Secondary | ICD-10-CM | POA: Diagnosis not present

## 2020-03-11 DIAGNOSIS — J9621 Acute and chronic respiratory failure with hypoxia: Secondary | ICD-10-CM | POA: Diagnosis not present

## 2020-03-11 DIAGNOSIS — Z6841 Body Mass Index (BMI) 40.0 and over, adult: Secondary | ICD-10-CM | POA: Diagnosis not present

## 2020-03-12 DIAGNOSIS — E1142 Type 2 diabetes mellitus with diabetic polyneuropathy: Secondary | ICD-10-CM | POA: Diagnosis not present

## 2020-03-12 DIAGNOSIS — F17210 Nicotine dependence, cigarettes, uncomplicated: Secondary | ICD-10-CM | POA: Diagnosis not present

## 2020-03-12 DIAGNOSIS — J449 Chronic obstructive pulmonary disease, unspecified: Secondary | ICD-10-CM | POA: Diagnosis not present

## 2020-03-12 DIAGNOSIS — J9621 Acute and chronic respiratory failure with hypoxia: Secondary | ICD-10-CM | POA: Diagnosis not present

## 2020-03-12 DIAGNOSIS — Z6841 Body Mass Index (BMI) 40.0 and over, adult: Secondary | ICD-10-CM | POA: Diagnosis not present

## 2020-03-13 ENCOUNTER — Encounter: Attending: Family Medicine | Admitting: Dietician

## 2020-03-13 DIAGNOSIS — E118 Type 2 diabetes mellitus with unspecified complications: Secondary | ICD-10-CM | POA: Diagnosis present

## 2020-03-13 NOTE — Patient Instructions (Signed)
Lunch options:  Soup and crackers  Sandwich and spinach   Healthy Choice Meal  Shrimp and Rice  Consider saving the boiled egg, cheese, applesauce and yogurt to snack on through the day rather than dried fruit and candy.

## 2020-03-13 NOTE — Progress Notes (Signed)
Medical Nutrition Therapy:  Appt start time: 2440 end time:  1555.  Assessment:  Primary concerns today:  This visit was completed via telephone due to the COVID-19 pandemic.   I spoke with [name] and verified that I was speaking with the correct person with two patient identifiers (full name and date of birth).   I discussed the limitations related to this kind of visit and the patient is willing to proceed.  I last met with her 12/2019. She states that since last visit she is now on hospice.  This has allowed her to get more care.  A nurse comes weekly to speak with her about her medication and other concerns.  Another nurse comes daily to bathe her.  Her sons are continuing to help her.  She has a hospital bed.  She uses a nebulizer at times.  She states that she has increased pain. She has decreased to 4 cigarettes per day and smokes on the balcony.  She has reduced the regular [employer] drink to 2 daily.  She vapes at times but states that this does not help. She enjoys listening to the Bible at night on a phone line.  She has a radio and sleeps with this on.She states that her blood sugar is good and bad.  She is now drinking more water, continues to eat candy and increased dried fruits as snacks. She is getting very tired of the sandwich her son fixes for lunch.  History includes polyneuropathy, hyperlipidemia, HTN, COPD, osteoporosis, smokes, toe amputation, dental issues and few teeth, and schizoaffective disorder. Medications include: Glipizide, basaglar 34units q hs, Metformin XR, Novolog  10 units before breakfast (usually skips breakfast but drinks a regular [employer] Drink), 15 units before lunch, and 15 units before dinner.  Last known A1C 7.3% decreased from 10.1% 02/27/2018.  Patient lives alone. She has 2 children nearby who are helpful, bring her food, bath her and help her with other tasks such as transportation as needed. She moved here 12 years ago from  [place] and is retired from the [employer]. One son has type 2 diabetes.Dentition is poor and requires soft food.  Preferred Learning Style:   No preference indicated   Learning Readiness:   Ready  Change in progress   MEDICATIONS: see list to include Glipizide, metformin, Basaglar 34 units q HS, and novolog 12 units before lunch and dinner (skips breakfast)   DIETARY INTAKE:  24-hr recall:  B ( AM): skips  Snk ( AM): none  L ( PM): boiled egg, yogurt, applesauce, spinach leaves, sandwich ([place] and cheese, tuna or chicken salad) Snk ( PM): dried apricots, black licorice D ( PM): Healthy Choice frozen meal (x2) or homemade soup Snk ( PM):  Beverages: water, coffee with sweet and low, [employer] Drink (2-3 daily), occasional diet coke, instant coffee with milk and sweet and low  Usual physical activity: ADL's  Estimated energy needs: 1400 calories 70 g protein  Progress Towards Goal(s):  In progress.   Nutritional Diagnosis:  NB-1.1 Food and nutrition-related knowledge deficit As related to balance of carbohydrates, protein, and fat.  As evidenced by diet hx and patient report.    Intervention:  Nutrition education/counseling continued related to nutrition with meals and snacks. We discussed options for lunch and snacks.  Lunch options:  Soup and crackers  Sandwich and spinach   Healthy Choice Meal  Shrimp and Rice  Consider saving the boiled egg, cheese, applesauce and yogurt to snack  on through the day rather than dried fruit and candy.    Teaching Method Utilized:  Auditory  Barriers to learning/adherence to lifestyle change: readiness to change, health status  Demonstrated degree of understanding via:  Teach Back   Monitoring/Evaluation:  Dietary intake, exercise, and body weight in 3 month(s).

## 2020-03-14 DIAGNOSIS — J9621 Acute and chronic respiratory failure with hypoxia: Secondary | ICD-10-CM | POA: Diagnosis not present

## 2020-03-14 DIAGNOSIS — F17210 Nicotine dependence, cigarettes, uncomplicated: Secondary | ICD-10-CM | POA: Diagnosis not present

## 2020-03-14 DIAGNOSIS — Z6841 Body Mass Index (BMI) 40.0 and over, adult: Secondary | ICD-10-CM | POA: Diagnosis not present

## 2020-03-14 DIAGNOSIS — E1142 Type 2 diabetes mellitus with diabetic polyneuropathy: Secondary | ICD-10-CM | POA: Diagnosis not present

## 2020-03-14 DIAGNOSIS — J449 Chronic obstructive pulmonary disease, unspecified: Secondary | ICD-10-CM | POA: Diagnosis not present

## 2020-03-18 DIAGNOSIS — F17210 Nicotine dependence, cigarettes, uncomplicated: Secondary | ICD-10-CM | POA: Diagnosis not present

## 2020-03-18 DIAGNOSIS — J449 Chronic obstructive pulmonary disease, unspecified: Secondary | ICD-10-CM | POA: Diagnosis not present

## 2020-03-18 DIAGNOSIS — J9621 Acute and chronic respiratory failure with hypoxia: Secondary | ICD-10-CM | POA: Diagnosis not present

## 2020-03-18 DIAGNOSIS — E1142 Type 2 diabetes mellitus with diabetic polyneuropathy: Secondary | ICD-10-CM | POA: Diagnosis not present

## 2020-03-18 DIAGNOSIS — Z6841 Body Mass Index (BMI) 40.0 and over, adult: Secondary | ICD-10-CM | POA: Diagnosis not present

## 2020-03-19 DIAGNOSIS — J9621 Acute and chronic respiratory failure with hypoxia: Secondary | ICD-10-CM | POA: Diagnosis not present

## 2020-03-19 DIAGNOSIS — E1142 Type 2 diabetes mellitus with diabetic polyneuropathy: Secondary | ICD-10-CM | POA: Diagnosis not present

## 2020-03-19 DIAGNOSIS — F17210 Nicotine dependence, cigarettes, uncomplicated: Secondary | ICD-10-CM | POA: Diagnosis not present

## 2020-03-19 DIAGNOSIS — J449 Chronic obstructive pulmonary disease, unspecified: Secondary | ICD-10-CM | POA: Diagnosis not present

## 2020-03-19 DIAGNOSIS — Z6841 Body Mass Index (BMI) 40.0 and over, adult: Secondary | ICD-10-CM | POA: Diagnosis not present

## 2020-03-21 DIAGNOSIS — J9621 Acute and chronic respiratory failure with hypoxia: Secondary | ICD-10-CM | POA: Diagnosis not present

## 2020-03-21 DIAGNOSIS — E1142 Type 2 diabetes mellitus with diabetic polyneuropathy: Secondary | ICD-10-CM | POA: Diagnosis not present

## 2020-03-21 DIAGNOSIS — F17210 Nicotine dependence, cigarettes, uncomplicated: Secondary | ICD-10-CM | POA: Diagnosis not present

## 2020-03-21 DIAGNOSIS — Z6841 Body Mass Index (BMI) 40.0 and over, adult: Secondary | ICD-10-CM | POA: Diagnosis not present

## 2020-03-21 DIAGNOSIS — J449 Chronic obstructive pulmonary disease, unspecified: Secondary | ICD-10-CM | POA: Diagnosis not present

## 2020-03-24 IMAGING — CR CHEST - 2 VIEW
2 series · 2 of 2 positions shown · non-contrast
Comparison: 09/18/2015

CLINICAL DATA: Shortness of breath

EXAM:
CHEST - 2 VIEW

[w chest lat]
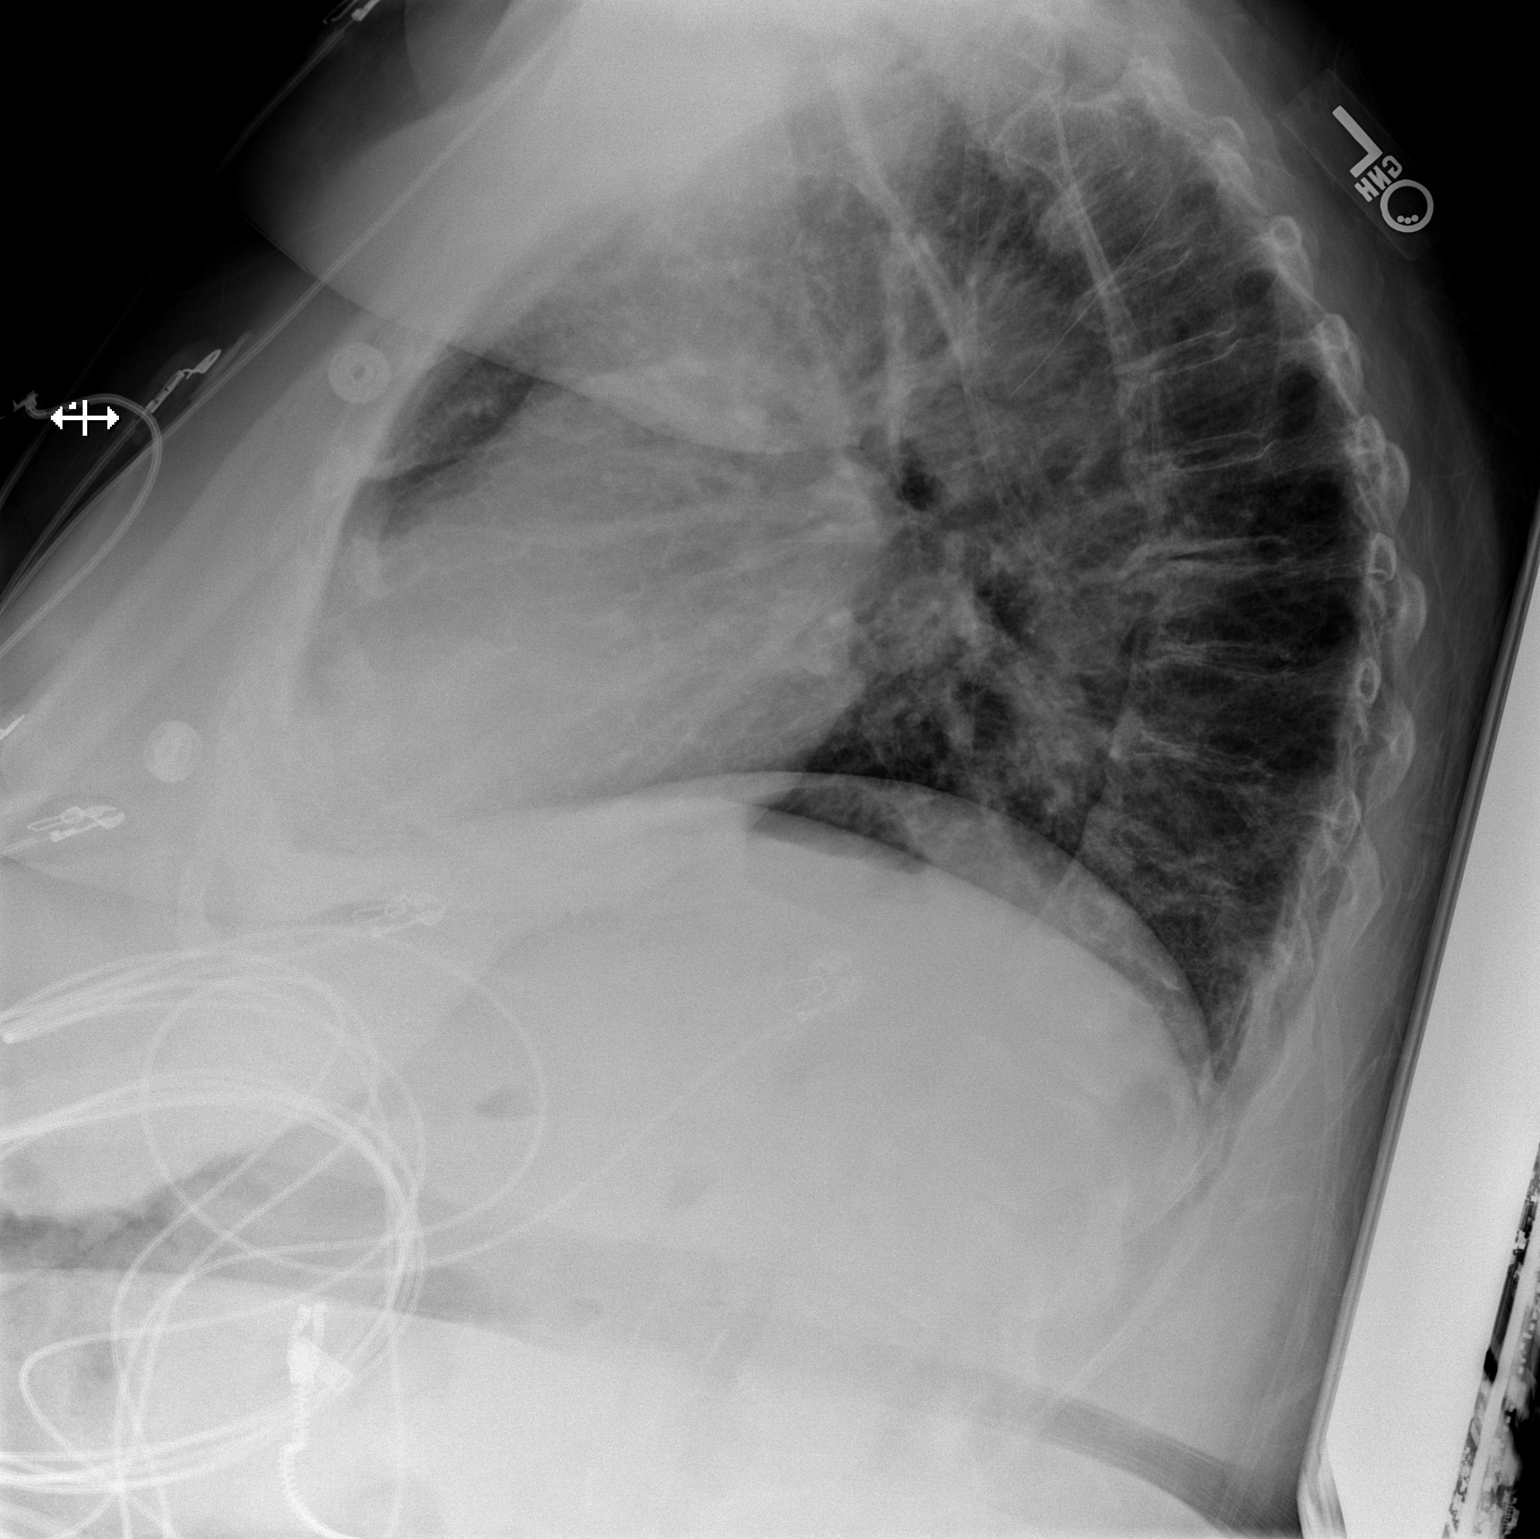

[x chest ap]
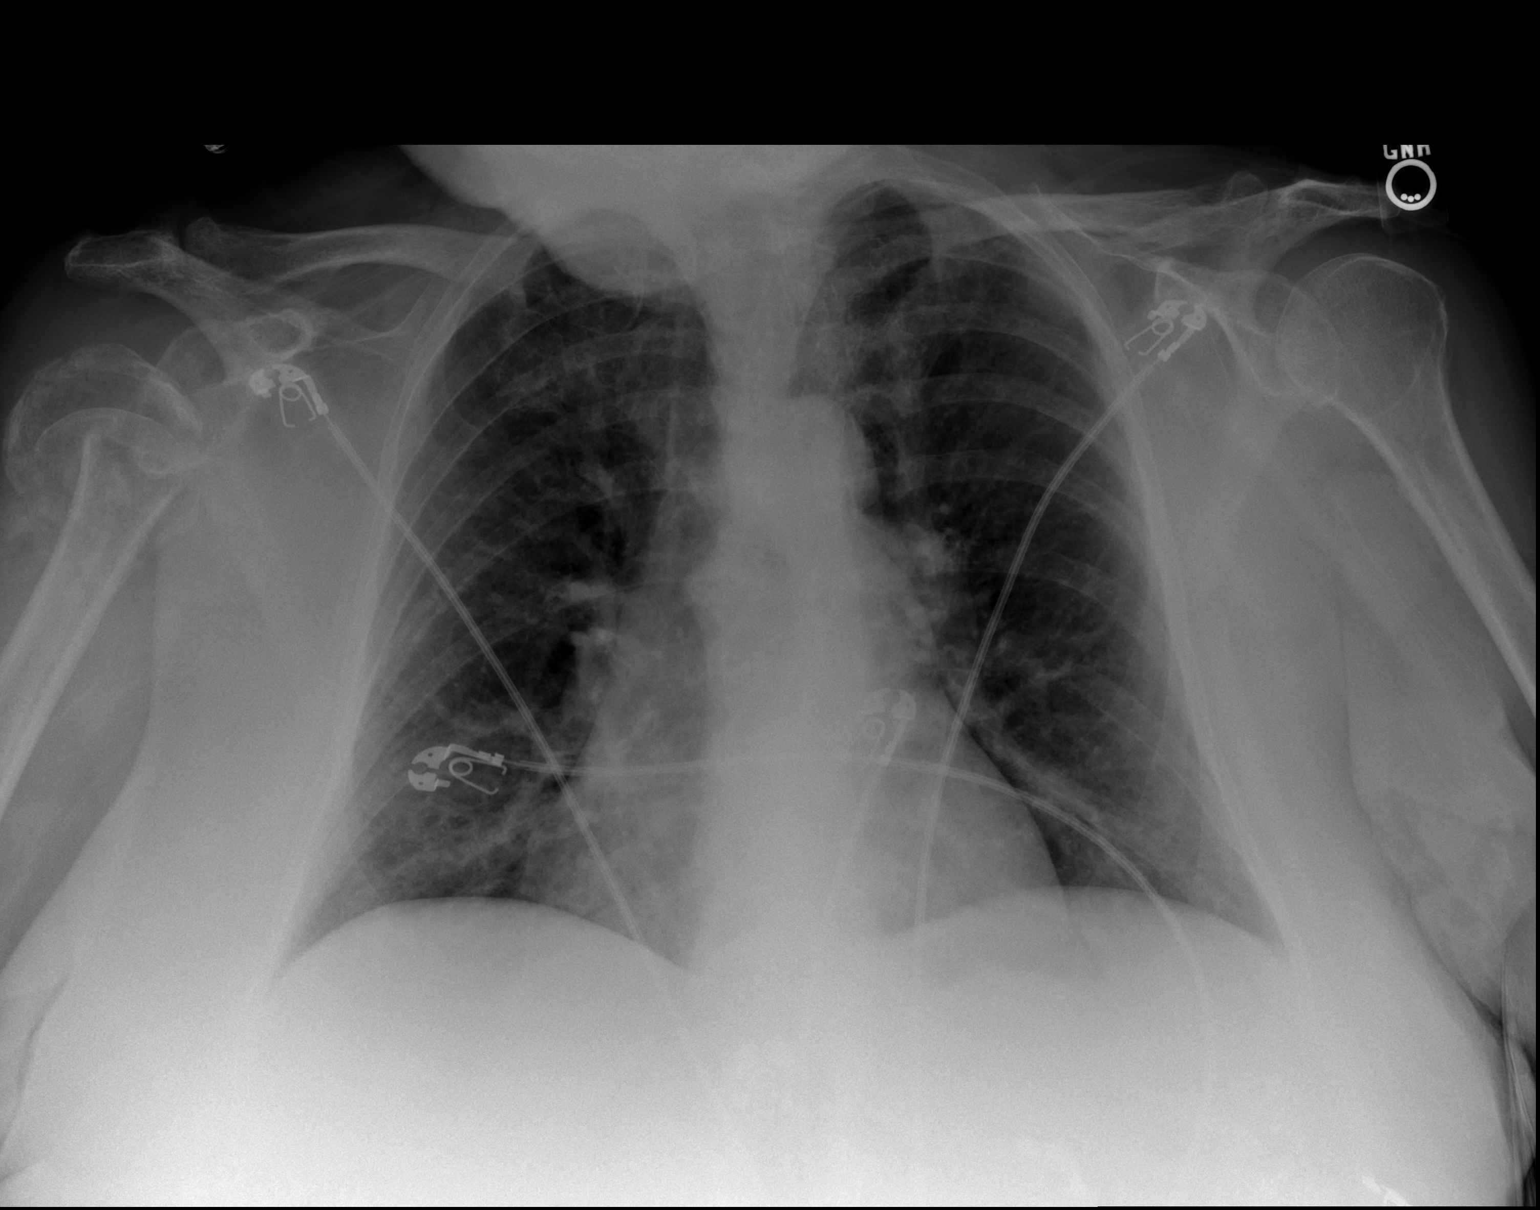

[2 of 2 positions shown; findings below may reference images not displayed]

FINDINGS: There is bilateral mild interstitial thickening likely chronic.
There is hazy right lower lobe airspace disease concerning for
pneumonia. There is no pleural effusion or pneumothorax. The heart
and mediastinal contours are unremarkable.

The osseous structures are unremarkable.
IMPRESSION: Hazy right lower lobe airspace disease concerning for pneumonia.
Followup PA and lateral chest X-ray is recommended in 3-4 weeks
following trial of antibiotic therapy to ensure resolution and
exclude underlying malignancy.

## 2020-03-25 DIAGNOSIS — E1142 Type 2 diabetes mellitus with diabetic polyneuropathy: Secondary | ICD-10-CM | POA: Diagnosis not present

## 2020-03-25 DIAGNOSIS — R159 Full incontinence of feces: Secondary | ICD-10-CM | POA: Diagnosis not present

## 2020-03-25 DIAGNOSIS — Z794 Long term (current) use of insulin: Secondary | ICD-10-CM | POA: Diagnosis not present

## 2020-03-25 DIAGNOSIS — Z741 Need for assistance with personal care: Secondary | ICD-10-CM | POA: Diagnosis not present

## 2020-03-25 DIAGNOSIS — S4291XS Fracture of right shoulder girdle, part unspecified, sequela: Secondary | ICD-10-CM | POA: Diagnosis not present

## 2020-03-25 DIAGNOSIS — Z9981 Dependence on supplemental oxygen: Secondary | ICD-10-CM | POA: Diagnosis not present

## 2020-03-25 DIAGNOSIS — F17210 Nicotine dependence, cigarettes, uncomplicated: Secondary | ICD-10-CM | POA: Diagnosis not present

## 2020-03-25 DIAGNOSIS — H409 Unspecified glaucoma: Secondary | ICD-10-CM | POA: Diagnosis not present

## 2020-03-25 DIAGNOSIS — J449 Chronic obstructive pulmonary disease, unspecified: Secondary | ICD-10-CM | POA: Diagnosis not present

## 2020-03-25 DIAGNOSIS — Z6841 Body Mass Index (BMI) 40.0 and over, adult: Secondary | ICD-10-CM | POA: Diagnosis not present

## 2020-03-25 DIAGNOSIS — F32A Depression, unspecified: Secondary | ICD-10-CM | POA: Diagnosis not present

## 2020-03-25 DIAGNOSIS — R32 Unspecified urinary incontinence: Secondary | ICD-10-CM | POA: Diagnosis not present

## 2020-03-25 DIAGNOSIS — I10 Essential (primary) hypertension: Secondary | ICD-10-CM | POA: Diagnosis not present

## 2020-03-25 DIAGNOSIS — J9621 Acute and chronic respiratory failure with hypoxia: Secondary | ICD-10-CM | POA: Diagnosis not present

## 2020-03-26 DIAGNOSIS — Z6841 Body Mass Index (BMI) 40.0 and over, adult: Secondary | ICD-10-CM | POA: Diagnosis not present

## 2020-03-26 DIAGNOSIS — E1142 Type 2 diabetes mellitus with diabetic polyneuropathy: Secondary | ICD-10-CM | POA: Diagnosis not present

## 2020-03-26 DIAGNOSIS — J449 Chronic obstructive pulmonary disease, unspecified: Secondary | ICD-10-CM | POA: Diagnosis not present

## 2020-03-26 DIAGNOSIS — F17210 Nicotine dependence, cigarettes, uncomplicated: Secondary | ICD-10-CM | POA: Diagnosis not present

## 2020-03-26 DIAGNOSIS — J9621 Acute and chronic respiratory failure with hypoxia: Secondary | ICD-10-CM | POA: Diagnosis not present

## 2020-03-28 DIAGNOSIS — Z6841 Body Mass Index (BMI) 40.0 and over, adult: Secondary | ICD-10-CM | POA: Diagnosis not present

## 2020-03-28 DIAGNOSIS — J9621 Acute and chronic respiratory failure with hypoxia: Secondary | ICD-10-CM | POA: Diagnosis not present

## 2020-03-28 DIAGNOSIS — E1142 Type 2 diabetes mellitus with diabetic polyneuropathy: Secondary | ICD-10-CM | POA: Diagnosis not present

## 2020-03-28 DIAGNOSIS — F17210 Nicotine dependence, cigarettes, uncomplicated: Secondary | ICD-10-CM | POA: Diagnosis not present

## 2020-03-28 DIAGNOSIS — J449 Chronic obstructive pulmonary disease, unspecified: Secondary | ICD-10-CM | POA: Diagnosis not present

## 2020-03-31 DIAGNOSIS — E1142 Type 2 diabetes mellitus with diabetic polyneuropathy: Secondary | ICD-10-CM | POA: Diagnosis not present

## 2020-03-31 DIAGNOSIS — Z6841 Body Mass Index (BMI) 40.0 and over, adult: Secondary | ICD-10-CM | POA: Diagnosis not present

## 2020-03-31 DIAGNOSIS — F17210 Nicotine dependence, cigarettes, uncomplicated: Secondary | ICD-10-CM | POA: Diagnosis not present

## 2020-03-31 DIAGNOSIS — J9621 Acute and chronic respiratory failure with hypoxia: Secondary | ICD-10-CM | POA: Diagnosis not present

## 2020-03-31 DIAGNOSIS — J449 Chronic obstructive pulmonary disease, unspecified: Secondary | ICD-10-CM | POA: Diagnosis not present

## 2020-04-02 DIAGNOSIS — J449 Chronic obstructive pulmonary disease, unspecified: Secondary | ICD-10-CM | POA: Diagnosis not present

## 2020-04-02 DIAGNOSIS — Z6841 Body Mass Index (BMI) 40.0 and over, adult: Secondary | ICD-10-CM | POA: Diagnosis not present

## 2020-04-02 DIAGNOSIS — E1142 Type 2 diabetes mellitus with diabetic polyneuropathy: Secondary | ICD-10-CM | POA: Diagnosis not present

## 2020-04-02 DIAGNOSIS — J9621 Acute and chronic respiratory failure with hypoxia: Secondary | ICD-10-CM | POA: Diagnosis not present

## 2020-04-02 DIAGNOSIS — F17210 Nicotine dependence, cigarettes, uncomplicated: Secondary | ICD-10-CM | POA: Diagnosis not present

## 2020-04-04 DIAGNOSIS — J449 Chronic obstructive pulmonary disease, unspecified: Secondary | ICD-10-CM | POA: Diagnosis not present

## 2020-04-04 DIAGNOSIS — J9621 Acute and chronic respiratory failure with hypoxia: Secondary | ICD-10-CM | POA: Diagnosis not present

## 2020-04-04 DIAGNOSIS — F17210 Nicotine dependence, cigarettes, uncomplicated: Secondary | ICD-10-CM | POA: Diagnosis not present

## 2020-04-04 DIAGNOSIS — Z6841 Body Mass Index (BMI) 40.0 and over, adult: Secondary | ICD-10-CM | POA: Diagnosis not present

## 2020-04-04 DIAGNOSIS — E1142 Type 2 diabetes mellitus with diabetic polyneuropathy: Secondary | ICD-10-CM | POA: Diagnosis not present

## 2020-04-07 DIAGNOSIS — F17210 Nicotine dependence, cigarettes, uncomplicated: Secondary | ICD-10-CM | POA: Diagnosis not present

## 2020-04-07 DIAGNOSIS — J9621 Acute and chronic respiratory failure with hypoxia: Secondary | ICD-10-CM | POA: Diagnosis not present

## 2020-04-07 DIAGNOSIS — E1142 Type 2 diabetes mellitus with diabetic polyneuropathy: Secondary | ICD-10-CM | POA: Diagnosis not present

## 2020-04-07 DIAGNOSIS — Z6841 Body Mass Index (BMI) 40.0 and over, adult: Secondary | ICD-10-CM | POA: Diagnosis not present

## 2020-04-07 DIAGNOSIS — J449 Chronic obstructive pulmonary disease, unspecified: Secondary | ICD-10-CM | POA: Diagnosis not present

## 2020-04-09 DIAGNOSIS — Z6841 Body Mass Index (BMI) 40.0 and over, adult: Secondary | ICD-10-CM | POA: Diagnosis not present

## 2020-04-09 DIAGNOSIS — F17210 Nicotine dependence, cigarettes, uncomplicated: Secondary | ICD-10-CM | POA: Diagnosis not present

## 2020-04-09 DIAGNOSIS — E1142 Type 2 diabetes mellitus with diabetic polyneuropathy: Secondary | ICD-10-CM | POA: Diagnosis not present

## 2020-04-09 DIAGNOSIS — J9621 Acute and chronic respiratory failure with hypoxia: Secondary | ICD-10-CM | POA: Diagnosis not present

## 2020-04-09 DIAGNOSIS — J449 Chronic obstructive pulmonary disease, unspecified: Secondary | ICD-10-CM | POA: Diagnosis not present

## 2020-04-11 DIAGNOSIS — J9621 Acute and chronic respiratory failure with hypoxia: Secondary | ICD-10-CM | POA: Diagnosis not present

## 2020-04-11 DIAGNOSIS — E1142 Type 2 diabetes mellitus with diabetic polyneuropathy: Secondary | ICD-10-CM | POA: Diagnosis not present

## 2020-04-11 DIAGNOSIS — Z6841 Body Mass Index (BMI) 40.0 and over, adult: Secondary | ICD-10-CM | POA: Diagnosis not present

## 2020-04-11 DIAGNOSIS — J449 Chronic obstructive pulmonary disease, unspecified: Secondary | ICD-10-CM | POA: Diagnosis not present

## 2020-04-11 DIAGNOSIS — F17210 Nicotine dependence, cigarettes, uncomplicated: Secondary | ICD-10-CM | POA: Diagnosis not present

## 2020-04-14 DIAGNOSIS — Z6841 Body Mass Index (BMI) 40.0 and over, adult: Secondary | ICD-10-CM | POA: Diagnosis not present

## 2020-04-14 DIAGNOSIS — J449 Chronic obstructive pulmonary disease, unspecified: Secondary | ICD-10-CM | POA: Diagnosis not present

## 2020-04-14 DIAGNOSIS — J9621 Acute and chronic respiratory failure with hypoxia: Secondary | ICD-10-CM | POA: Diagnosis not present

## 2020-04-14 DIAGNOSIS — E1142 Type 2 diabetes mellitus with diabetic polyneuropathy: Secondary | ICD-10-CM | POA: Diagnosis not present

## 2020-04-14 DIAGNOSIS — F17210 Nicotine dependence, cigarettes, uncomplicated: Secondary | ICD-10-CM | POA: Diagnosis not present

## 2020-04-16 DIAGNOSIS — J449 Chronic obstructive pulmonary disease, unspecified: Secondary | ICD-10-CM | POA: Diagnosis not present

## 2020-04-16 DIAGNOSIS — F17210 Nicotine dependence, cigarettes, uncomplicated: Secondary | ICD-10-CM | POA: Diagnosis not present

## 2020-04-16 DIAGNOSIS — J9621 Acute and chronic respiratory failure with hypoxia: Secondary | ICD-10-CM | POA: Diagnosis not present

## 2020-04-16 DIAGNOSIS — Z6841 Body Mass Index (BMI) 40.0 and over, adult: Secondary | ICD-10-CM | POA: Diagnosis not present

## 2020-04-16 DIAGNOSIS — E1142 Type 2 diabetes mellitus with diabetic polyneuropathy: Secondary | ICD-10-CM | POA: Diagnosis not present

## 2020-04-18 DIAGNOSIS — J9621 Acute and chronic respiratory failure with hypoxia: Secondary | ICD-10-CM | POA: Diagnosis not present

## 2020-04-18 DIAGNOSIS — Z6841 Body Mass Index (BMI) 40.0 and over, adult: Secondary | ICD-10-CM | POA: Diagnosis not present

## 2020-04-18 DIAGNOSIS — F17210 Nicotine dependence, cigarettes, uncomplicated: Secondary | ICD-10-CM | POA: Diagnosis not present

## 2020-04-18 DIAGNOSIS — J449 Chronic obstructive pulmonary disease, unspecified: Secondary | ICD-10-CM | POA: Diagnosis not present

## 2020-04-18 DIAGNOSIS — E1142 Type 2 diabetes mellitus with diabetic polyneuropathy: Secondary | ICD-10-CM | POA: Diagnosis not present

## 2020-04-21 DIAGNOSIS — F17210 Nicotine dependence, cigarettes, uncomplicated: Secondary | ICD-10-CM | POA: Diagnosis not present

## 2020-04-21 DIAGNOSIS — J9621 Acute and chronic respiratory failure with hypoxia: Secondary | ICD-10-CM | POA: Diagnosis not present

## 2020-04-21 DIAGNOSIS — Z6841 Body Mass Index (BMI) 40.0 and over, adult: Secondary | ICD-10-CM | POA: Diagnosis not present

## 2020-04-21 DIAGNOSIS — J449 Chronic obstructive pulmonary disease, unspecified: Secondary | ICD-10-CM | POA: Diagnosis not present

## 2020-04-21 DIAGNOSIS — E1142 Type 2 diabetes mellitus with diabetic polyneuropathy: Secondary | ICD-10-CM | POA: Diagnosis not present

## 2020-04-23 DIAGNOSIS — J449 Chronic obstructive pulmonary disease, unspecified: Secondary | ICD-10-CM | POA: Diagnosis not present

## 2020-04-23 DIAGNOSIS — E1142 Type 2 diabetes mellitus with diabetic polyneuropathy: Secondary | ICD-10-CM | POA: Diagnosis not present

## 2020-04-23 DIAGNOSIS — F17210 Nicotine dependence, cigarettes, uncomplicated: Secondary | ICD-10-CM | POA: Diagnosis not present

## 2020-04-23 DIAGNOSIS — J9621 Acute and chronic respiratory failure with hypoxia: Secondary | ICD-10-CM | POA: Diagnosis not present

## 2020-04-23 DIAGNOSIS — Z6841 Body Mass Index (BMI) 40.0 and over, adult: Secondary | ICD-10-CM | POA: Diagnosis not present

## 2020-04-23 DIAGNOSIS — H401312 Pigmentary glaucoma, right eye, moderate stage: Secondary | ICD-10-CM | POA: Diagnosis not present

## 2020-04-25 DIAGNOSIS — Z794 Long term (current) use of insulin: Secondary | ICD-10-CM | POA: Diagnosis not present

## 2020-04-25 DIAGNOSIS — Z6841 Body Mass Index (BMI) 40.0 and over, adult: Secondary | ICD-10-CM | POA: Diagnosis not present

## 2020-04-25 DIAGNOSIS — H409 Unspecified glaucoma: Secondary | ICD-10-CM | POA: Diagnosis not present

## 2020-04-25 DIAGNOSIS — Z9981 Dependence on supplemental oxygen: Secondary | ICD-10-CM | POA: Diagnosis not present

## 2020-04-25 DIAGNOSIS — I10 Essential (primary) hypertension: Secondary | ICD-10-CM | POA: Diagnosis not present

## 2020-04-25 DIAGNOSIS — R159 Full incontinence of feces: Secondary | ICD-10-CM | POA: Diagnosis not present

## 2020-04-25 DIAGNOSIS — S4291XS Fracture of right shoulder girdle, part unspecified, sequela: Secondary | ICD-10-CM | POA: Diagnosis not present

## 2020-04-25 DIAGNOSIS — J449 Chronic obstructive pulmonary disease, unspecified: Secondary | ICD-10-CM | POA: Diagnosis not present

## 2020-04-25 DIAGNOSIS — J9621 Acute and chronic respiratory failure with hypoxia: Secondary | ICD-10-CM | POA: Diagnosis not present

## 2020-04-25 DIAGNOSIS — F32A Depression, unspecified: Secondary | ICD-10-CM | POA: Diagnosis not present

## 2020-04-25 DIAGNOSIS — R32 Unspecified urinary incontinence: Secondary | ICD-10-CM | POA: Diagnosis not present

## 2020-04-25 DIAGNOSIS — E1142 Type 2 diabetes mellitus with diabetic polyneuropathy: Secondary | ICD-10-CM | POA: Diagnosis not present

## 2020-04-25 DIAGNOSIS — F17210 Nicotine dependence, cigarettes, uncomplicated: Secondary | ICD-10-CM | POA: Diagnosis not present

## 2020-04-25 DIAGNOSIS — Z741 Need for assistance with personal care: Secondary | ICD-10-CM | POA: Diagnosis not present

## 2020-04-28 DIAGNOSIS — J449 Chronic obstructive pulmonary disease, unspecified: Secondary | ICD-10-CM | POA: Diagnosis not present

## 2020-04-28 DIAGNOSIS — J9621 Acute and chronic respiratory failure with hypoxia: Secondary | ICD-10-CM | POA: Diagnosis not present

## 2020-04-28 DIAGNOSIS — Z6841 Body Mass Index (BMI) 40.0 and over, adult: Secondary | ICD-10-CM | POA: Diagnosis not present

## 2020-04-28 DIAGNOSIS — F17210 Nicotine dependence, cigarettes, uncomplicated: Secondary | ICD-10-CM | POA: Diagnosis not present

## 2020-04-28 DIAGNOSIS — E1142 Type 2 diabetes mellitus with diabetic polyneuropathy: Secondary | ICD-10-CM | POA: Diagnosis not present

## 2020-04-30 DIAGNOSIS — Z6841 Body Mass Index (BMI) 40.0 and over, adult: Secondary | ICD-10-CM | POA: Diagnosis not present

## 2020-04-30 DIAGNOSIS — E1142 Type 2 diabetes mellitus with diabetic polyneuropathy: Secondary | ICD-10-CM | POA: Diagnosis not present

## 2020-04-30 DIAGNOSIS — J9621 Acute and chronic respiratory failure with hypoxia: Secondary | ICD-10-CM | POA: Diagnosis not present

## 2020-04-30 DIAGNOSIS — J449 Chronic obstructive pulmonary disease, unspecified: Secondary | ICD-10-CM | POA: Diagnosis not present

## 2020-04-30 DIAGNOSIS — F17210 Nicotine dependence, cigarettes, uncomplicated: Secondary | ICD-10-CM | POA: Diagnosis not present

## 2020-05-02 DIAGNOSIS — E1142 Type 2 diabetes mellitus with diabetic polyneuropathy: Secondary | ICD-10-CM | POA: Diagnosis not present

## 2020-05-02 DIAGNOSIS — J449 Chronic obstructive pulmonary disease, unspecified: Secondary | ICD-10-CM | POA: Diagnosis not present

## 2020-05-02 DIAGNOSIS — F17210 Nicotine dependence, cigarettes, uncomplicated: Secondary | ICD-10-CM | POA: Diagnosis not present

## 2020-05-02 DIAGNOSIS — Z6841 Body Mass Index (BMI) 40.0 and over, adult: Secondary | ICD-10-CM | POA: Diagnosis not present

## 2020-05-02 DIAGNOSIS — J9621 Acute and chronic respiratory failure with hypoxia: Secondary | ICD-10-CM | POA: Diagnosis not present

## 2020-05-05 DIAGNOSIS — Z6841 Body Mass Index (BMI) 40.0 and over, adult: Secondary | ICD-10-CM | POA: Diagnosis not present

## 2020-05-05 DIAGNOSIS — E1142 Type 2 diabetes mellitus with diabetic polyneuropathy: Secondary | ICD-10-CM | POA: Diagnosis not present

## 2020-05-05 DIAGNOSIS — J449 Chronic obstructive pulmonary disease, unspecified: Secondary | ICD-10-CM | POA: Diagnosis not present

## 2020-05-05 DIAGNOSIS — F17210 Nicotine dependence, cigarettes, uncomplicated: Secondary | ICD-10-CM | POA: Diagnosis not present

## 2020-05-05 DIAGNOSIS — J9621 Acute and chronic respiratory failure with hypoxia: Secondary | ICD-10-CM | POA: Diagnosis not present

## 2020-05-07 DIAGNOSIS — F17210 Nicotine dependence, cigarettes, uncomplicated: Secondary | ICD-10-CM | POA: Diagnosis not present

## 2020-05-07 DIAGNOSIS — J9621 Acute and chronic respiratory failure with hypoxia: Secondary | ICD-10-CM | POA: Diagnosis not present

## 2020-05-07 DIAGNOSIS — Z6841 Body Mass Index (BMI) 40.0 and over, adult: Secondary | ICD-10-CM | POA: Diagnosis not present

## 2020-05-07 DIAGNOSIS — J449 Chronic obstructive pulmonary disease, unspecified: Secondary | ICD-10-CM | POA: Diagnosis not present

## 2020-05-07 DIAGNOSIS — E1142 Type 2 diabetes mellitus with diabetic polyneuropathy: Secondary | ICD-10-CM | POA: Diagnosis not present

## 2020-05-09 DIAGNOSIS — J9621 Acute and chronic respiratory failure with hypoxia: Secondary | ICD-10-CM | POA: Diagnosis not present

## 2020-05-09 DIAGNOSIS — J449 Chronic obstructive pulmonary disease, unspecified: Secondary | ICD-10-CM | POA: Diagnosis not present

## 2020-05-09 DIAGNOSIS — E1142 Type 2 diabetes mellitus with diabetic polyneuropathy: Secondary | ICD-10-CM | POA: Diagnosis not present

## 2020-05-09 DIAGNOSIS — Z6841 Body Mass Index (BMI) 40.0 and over, adult: Secondary | ICD-10-CM | POA: Diagnosis not present

## 2020-05-09 DIAGNOSIS — F17210 Nicotine dependence, cigarettes, uncomplicated: Secondary | ICD-10-CM | POA: Diagnosis not present

## 2020-05-12 DIAGNOSIS — Z6841 Body Mass Index (BMI) 40.0 and over, adult: Secondary | ICD-10-CM | POA: Diagnosis not present

## 2020-05-12 DIAGNOSIS — E1142 Type 2 diabetes mellitus with diabetic polyneuropathy: Secondary | ICD-10-CM | POA: Diagnosis not present

## 2020-05-12 DIAGNOSIS — J9621 Acute and chronic respiratory failure with hypoxia: Secondary | ICD-10-CM | POA: Diagnosis not present

## 2020-05-12 DIAGNOSIS — F17210 Nicotine dependence, cigarettes, uncomplicated: Secondary | ICD-10-CM | POA: Diagnosis not present

## 2020-05-12 DIAGNOSIS — J449 Chronic obstructive pulmonary disease, unspecified: Secondary | ICD-10-CM | POA: Diagnosis not present

## 2020-05-14 DIAGNOSIS — J449 Chronic obstructive pulmonary disease, unspecified: Secondary | ICD-10-CM | POA: Diagnosis not present

## 2020-05-14 DIAGNOSIS — J9621 Acute and chronic respiratory failure with hypoxia: Secondary | ICD-10-CM | POA: Diagnosis not present

## 2020-05-14 DIAGNOSIS — E1142 Type 2 diabetes mellitus with diabetic polyneuropathy: Secondary | ICD-10-CM | POA: Diagnosis not present

## 2020-05-14 DIAGNOSIS — Z6841 Body Mass Index (BMI) 40.0 and over, adult: Secondary | ICD-10-CM | POA: Diagnosis not present

## 2020-05-14 DIAGNOSIS — F17210 Nicotine dependence, cigarettes, uncomplicated: Secondary | ICD-10-CM | POA: Diagnosis not present

## 2020-05-15 DIAGNOSIS — E1142 Type 2 diabetes mellitus with diabetic polyneuropathy: Secondary | ICD-10-CM | POA: Diagnosis not present

## 2020-05-15 DIAGNOSIS — Z6841 Body Mass Index (BMI) 40.0 and over, adult: Secondary | ICD-10-CM | POA: Diagnosis not present

## 2020-05-15 DIAGNOSIS — F17210 Nicotine dependence, cigarettes, uncomplicated: Secondary | ICD-10-CM | POA: Diagnosis not present

## 2020-05-15 DIAGNOSIS — J9621 Acute and chronic respiratory failure with hypoxia: Secondary | ICD-10-CM | POA: Diagnosis not present

## 2020-05-15 DIAGNOSIS — J449 Chronic obstructive pulmonary disease, unspecified: Secondary | ICD-10-CM | POA: Diagnosis not present

## 2020-05-16 DIAGNOSIS — J449 Chronic obstructive pulmonary disease, unspecified: Secondary | ICD-10-CM | POA: Diagnosis not present

## 2020-05-16 DIAGNOSIS — J9621 Acute and chronic respiratory failure with hypoxia: Secondary | ICD-10-CM | POA: Diagnosis not present

## 2020-05-16 DIAGNOSIS — E1142 Type 2 diabetes mellitus with diabetic polyneuropathy: Secondary | ICD-10-CM | POA: Diagnosis not present

## 2020-05-16 DIAGNOSIS — Z6841 Body Mass Index (BMI) 40.0 and over, adult: Secondary | ICD-10-CM | POA: Diagnosis not present

## 2020-05-16 DIAGNOSIS — F17210 Nicotine dependence, cigarettes, uncomplicated: Secondary | ICD-10-CM | POA: Diagnosis not present

## 2020-05-19 DIAGNOSIS — J449 Chronic obstructive pulmonary disease, unspecified: Secondary | ICD-10-CM | POA: Diagnosis not present

## 2020-05-19 DIAGNOSIS — J9621 Acute and chronic respiratory failure with hypoxia: Secondary | ICD-10-CM | POA: Diagnosis not present

## 2020-05-19 DIAGNOSIS — F17210 Nicotine dependence, cigarettes, uncomplicated: Secondary | ICD-10-CM | POA: Diagnosis not present

## 2020-05-19 DIAGNOSIS — Z6841 Body Mass Index (BMI) 40.0 and over, adult: Secondary | ICD-10-CM | POA: Diagnosis not present

## 2020-05-19 DIAGNOSIS — F259 Schizoaffective disorder, unspecified: Secondary | ICD-10-CM | POA: Diagnosis not present

## 2020-05-19 DIAGNOSIS — F419 Anxiety disorder, unspecified: Secondary | ICD-10-CM | POA: Diagnosis not present

## 2020-05-19 DIAGNOSIS — E1142 Type 2 diabetes mellitus with diabetic polyneuropathy: Secondary | ICD-10-CM | POA: Diagnosis not present

## 2020-05-19 DIAGNOSIS — F331 Major depressive disorder, recurrent, moderate: Secondary | ICD-10-CM | POA: Diagnosis not present

## 2020-05-21 DIAGNOSIS — Z6841 Body Mass Index (BMI) 40.0 and over, adult: Secondary | ICD-10-CM | POA: Diagnosis not present

## 2020-05-21 DIAGNOSIS — J9621 Acute and chronic respiratory failure with hypoxia: Secondary | ICD-10-CM | POA: Diagnosis not present

## 2020-05-21 DIAGNOSIS — F17210 Nicotine dependence, cigarettes, uncomplicated: Secondary | ICD-10-CM | POA: Diagnosis not present

## 2020-05-21 DIAGNOSIS — E1142 Type 2 diabetes mellitus with diabetic polyneuropathy: Secondary | ICD-10-CM | POA: Diagnosis not present

## 2020-05-21 DIAGNOSIS — J449 Chronic obstructive pulmonary disease, unspecified: Secondary | ICD-10-CM | POA: Diagnosis not present

## 2020-05-23 DIAGNOSIS — J9621 Acute and chronic respiratory failure with hypoxia: Secondary | ICD-10-CM | POA: Diagnosis not present

## 2020-05-23 DIAGNOSIS — J449 Chronic obstructive pulmonary disease, unspecified: Secondary | ICD-10-CM | POA: Diagnosis not present

## 2020-05-23 DIAGNOSIS — E1142 Type 2 diabetes mellitus with diabetic polyneuropathy: Secondary | ICD-10-CM | POA: Diagnosis not present

## 2020-05-23 DIAGNOSIS — Z6841 Body Mass Index (BMI) 40.0 and over, adult: Secondary | ICD-10-CM | POA: Diagnosis not present

## 2020-05-23 DIAGNOSIS — F17210 Nicotine dependence, cigarettes, uncomplicated: Secondary | ICD-10-CM | POA: Diagnosis not present

## 2020-05-25 DIAGNOSIS — R32 Unspecified urinary incontinence: Secondary | ICD-10-CM | POA: Diagnosis not present

## 2020-05-25 DIAGNOSIS — J9621 Acute and chronic respiratory failure with hypoxia: Secondary | ICD-10-CM | POA: Diagnosis not present

## 2020-05-25 DIAGNOSIS — F17210 Nicotine dependence, cigarettes, uncomplicated: Secondary | ICD-10-CM | POA: Diagnosis not present

## 2020-05-25 DIAGNOSIS — R159 Full incontinence of feces: Secondary | ICD-10-CM | POA: Diagnosis not present

## 2020-05-25 DIAGNOSIS — Z9981 Dependence on supplemental oxygen: Secondary | ICD-10-CM | POA: Diagnosis not present

## 2020-05-25 DIAGNOSIS — I10 Essential (primary) hypertension: Secondary | ICD-10-CM | POA: Diagnosis not present

## 2020-05-25 DIAGNOSIS — K5900 Constipation, unspecified: Secondary | ICD-10-CM | POA: Diagnosis not present

## 2020-05-25 DIAGNOSIS — Z794 Long term (current) use of insulin: Secondary | ICD-10-CM | POA: Diagnosis not present

## 2020-05-25 DIAGNOSIS — H409 Unspecified glaucoma: Secondary | ICD-10-CM | POA: Diagnosis not present

## 2020-05-25 DIAGNOSIS — Z741 Need for assistance with personal care: Secondary | ICD-10-CM | POA: Diagnosis not present

## 2020-05-25 DIAGNOSIS — J449 Chronic obstructive pulmonary disease, unspecified: Secondary | ICD-10-CM | POA: Diagnosis not present

## 2020-05-25 DIAGNOSIS — F32A Depression, unspecified: Secondary | ICD-10-CM | POA: Diagnosis not present

## 2020-05-25 DIAGNOSIS — Z6841 Body Mass Index (BMI) 40.0 and over, adult: Secondary | ICD-10-CM | POA: Diagnosis not present

## 2020-05-25 DIAGNOSIS — S4291XS Fracture of right shoulder girdle, part unspecified, sequela: Secondary | ICD-10-CM | POA: Diagnosis not present

## 2020-05-25 DIAGNOSIS — E1142 Type 2 diabetes mellitus with diabetic polyneuropathy: Secondary | ICD-10-CM | POA: Diagnosis not present

## 2020-05-26 DIAGNOSIS — J9621 Acute and chronic respiratory failure with hypoxia: Secondary | ICD-10-CM | POA: Diagnosis not present

## 2020-05-26 DIAGNOSIS — E1142 Type 2 diabetes mellitus with diabetic polyneuropathy: Secondary | ICD-10-CM | POA: Diagnosis not present

## 2020-05-26 DIAGNOSIS — F17210 Nicotine dependence, cigarettes, uncomplicated: Secondary | ICD-10-CM | POA: Diagnosis not present

## 2020-05-26 DIAGNOSIS — Z6841 Body Mass Index (BMI) 40.0 and over, adult: Secondary | ICD-10-CM | POA: Diagnosis not present

## 2020-05-26 DIAGNOSIS — J449 Chronic obstructive pulmonary disease, unspecified: Secondary | ICD-10-CM | POA: Diagnosis not present

## 2020-05-28 DIAGNOSIS — Z6841 Body Mass Index (BMI) 40.0 and over, adult: Secondary | ICD-10-CM | POA: Diagnosis not present

## 2020-05-28 DIAGNOSIS — E1142 Type 2 diabetes mellitus with diabetic polyneuropathy: Secondary | ICD-10-CM | POA: Diagnosis not present

## 2020-05-28 DIAGNOSIS — F17210 Nicotine dependence, cigarettes, uncomplicated: Secondary | ICD-10-CM | POA: Diagnosis not present

## 2020-05-28 DIAGNOSIS — J9621 Acute and chronic respiratory failure with hypoxia: Secondary | ICD-10-CM | POA: Diagnosis not present

## 2020-05-28 DIAGNOSIS — J449 Chronic obstructive pulmonary disease, unspecified: Secondary | ICD-10-CM | POA: Diagnosis not present

## 2020-05-30 DIAGNOSIS — F17210 Nicotine dependence, cigarettes, uncomplicated: Secondary | ICD-10-CM | POA: Diagnosis not present

## 2020-05-30 DIAGNOSIS — E1142 Type 2 diabetes mellitus with diabetic polyneuropathy: Secondary | ICD-10-CM | POA: Diagnosis not present

## 2020-05-30 DIAGNOSIS — Z6841 Body Mass Index (BMI) 40.0 and over, adult: Secondary | ICD-10-CM | POA: Diagnosis not present

## 2020-05-30 DIAGNOSIS — J9621 Acute and chronic respiratory failure with hypoxia: Secondary | ICD-10-CM | POA: Diagnosis not present

## 2020-05-30 DIAGNOSIS — J449 Chronic obstructive pulmonary disease, unspecified: Secondary | ICD-10-CM | POA: Diagnosis not present

## 2020-06-04 DIAGNOSIS — F17210 Nicotine dependence, cigarettes, uncomplicated: Secondary | ICD-10-CM | POA: Diagnosis not present

## 2020-06-04 DIAGNOSIS — J449 Chronic obstructive pulmonary disease, unspecified: Secondary | ICD-10-CM | POA: Diagnosis not present

## 2020-06-04 DIAGNOSIS — J9621 Acute and chronic respiratory failure with hypoxia: Secondary | ICD-10-CM | POA: Diagnosis not present

## 2020-06-04 DIAGNOSIS — E1142 Type 2 diabetes mellitus with diabetic polyneuropathy: Secondary | ICD-10-CM | POA: Diagnosis not present

## 2020-06-04 DIAGNOSIS — Z6841 Body Mass Index (BMI) 40.0 and over, adult: Secondary | ICD-10-CM | POA: Diagnosis not present

## 2020-06-06 DIAGNOSIS — E1142 Type 2 diabetes mellitus with diabetic polyneuropathy: Secondary | ICD-10-CM | POA: Diagnosis not present

## 2020-06-06 DIAGNOSIS — J9621 Acute and chronic respiratory failure with hypoxia: Secondary | ICD-10-CM | POA: Diagnosis not present

## 2020-06-06 DIAGNOSIS — J449 Chronic obstructive pulmonary disease, unspecified: Secondary | ICD-10-CM | POA: Diagnosis not present

## 2020-06-06 DIAGNOSIS — F17210 Nicotine dependence, cigarettes, uncomplicated: Secondary | ICD-10-CM | POA: Diagnosis not present

## 2020-06-06 DIAGNOSIS — Z6841 Body Mass Index (BMI) 40.0 and over, adult: Secondary | ICD-10-CM | POA: Diagnosis not present

## 2020-06-09 DIAGNOSIS — J449 Chronic obstructive pulmonary disease, unspecified: Secondary | ICD-10-CM | POA: Diagnosis not present

## 2020-06-09 DIAGNOSIS — Z6841 Body Mass Index (BMI) 40.0 and over, adult: Secondary | ICD-10-CM | POA: Diagnosis not present

## 2020-06-09 DIAGNOSIS — E1142 Type 2 diabetes mellitus with diabetic polyneuropathy: Secondary | ICD-10-CM | POA: Diagnosis not present

## 2020-06-09 DIAGNOSIS — F17210 Nicotine dependence, cigarettes, uncomplicated: Secondary | ICD-10-CM | POA: Diagnosis not present

## 2020-06-09 DIAGNOSIS — J9621 Acute and chronic respiratory failure with hypoxia: Secondary | ICD-10-CM | POA: Diagnosis not present

## 2020-06-11 DIAGNOSIS — Z6841 Body Mass Index (BMI) 40.0 and over, adult: Secondary | ICD-10-CM | POA: Diagnosis not present

## 2020-06-11 DIAGNOSIS — F17210 Nicotine dependence, cigarettes, uncomplicated: Secondary | ICD-10-CM | POA: Diagnosis not present

## 2020-06-11 DIAGNOSIS — J9621 Acute and chronic respiratory failure with hypoxia: Secondary | ICD-10-CM | POA: Diagnosis not present

## 2020-06-11 DIAGNOSIS — E1142 Type 2 diabetes mellitus with diabetic polyneuropathy: Secondary | ICD-10-CM | POA: Diagnosis not present

## 2020-06-11 DIAGNOSIS — J449 Chronic obstructive pulmonary disease, unspecified: Secondary | ICD-10-CM | POA: Diagnosis not present

## 2020-06-12 ENCOUNTER — Encounter: Attending: Family Medicine | Admitting: Dietician

## 2020-06-12 DIAGNOSIS — E118 Type 2 diabetes mellitus with unspecified complications: Secondary | ICD-10-CM | POA: Insufficient documentation

## 2020-06-12 DIAGNOSIS — H401311 Pigmentary glaucoma, right eye, mild stage: Secondary | ICD-10-CM | POA: Diagnosis not present

## 2020-06-13 DIAGNOSIS — J449 Chronic obstructive pulmonary disease, unspecified: Secondary | ICD-10-CM | POA: Diagnosis not present

## 2020-06-13 DIAGNOSIS — F17210 Nicotine dependence, cigarettes, uncomplicated: Secondary | ICD-10-CM | POA: Diagnosis not present

## 2020-06-13 DIAGNOSIS — J9621 Acute and chronic respiratory failure with hypoxia: Secondary | ICD-10-CM | POA: Diagnosis not present

## 2020-06-13 DIAGNOSIS — Z6841 Body Mass Index (BMI) 40.0 and over, adult: Secondary | ICD-10-CM | POA: Diagnosis not present

## 2020-06-13 DIAGNOSIS — E1142 Type 2 diabetes mellitus with diabetic polyneuropathy: Secondary | ICD-10-CM | POA: Diagnosis not present

## 2020-06-16 DIAGNOSIS — J449 Chronic obstructive pulmonary disease, unspecified: Secondary | ICD-10-CM | POA: Diagnosis not present

## 2020-06-16 DIAGNOSIS — E1142 Type 2 diabetes mellitus with diabetic polyneuropathy: Secondary | ICD-10-CM | POA: Diagnosis not present

## 2020-06-16 DIAGNOSIS — J9621 Acute and chronic respiratory failure with hypoxia: Secondary | ICD-10-CM | POA: Diagnosis not present

## 2020-06-16 DIAGNOSIS — F17210 Nicotine dependence, cigarettes, uncomplicated: Secondary | ICD-10-CM | POA: Diagnosis not present

## 2020-06-16 DIAGNOSIS — Z6841 Body Mass Index (BMI) 40.0 and over, adult: Secondary | ICD-10-CM | POA: Diagnosis not present

## 2020-06-17 DIAGNOSIS — Z6841 Body Mass Index (BMI) 40.0 and over, adult: Secondary | ICD-10-CM | POA: Diagnosis not present

## 2020-06-17 DIAGNOSIS — J9621 Acute and chronic respiratory failure with hypoxia: Secondary | ICD-10-CM | POA: Diagnosis not present

## 2020-06-17 DIAGNOSIS — E1142 Type 2 diabetes mellitus with diabetic polyneuropathy: Secondary | ICD-10-CM | POA: Diagnosis not present

## 2020-06-17 DIAGNOSIS — J449 Chronic obstructive pulmonary disease, unspecified: Secondary | ICD-10-CM | POA: Diagnosis not present

## 2020-06-17 DIAGNOSIS — F17210 Nicotine dependence, cigarettes, uncomplicated: Secondary | ICD-10-CM | POA: Diagnosis not present

## 2020-06-18 DIAGNOSIS — E1142 Type 2 diabetes mellitus with diabetic polyneuropathy: Secondary | ICD-10-CM | POA: Diagnosis not present

## 2020-06-18 DIAGNOSIS — J9621 Acute and chronic respiratory failure with hypoxia: Secondary | ICD-10-CM | POA: Diagnosis not present

## 2020-06-18 DIAGNOSIS — J449 Chronic obstructive pulmonary disease, unspecified: Secondary | ICD-10-CM | POA: Diagnosis not present

## 2020-06-18 DIAGNOSIS — F17210 Nicotine dependence, cigarettes, uncomplicated: Secondary | ICD-10-CM | POA: Diagnosis not present

## 2020-06-18 DIAGNOSIS — Z6841 Body Mass Index (BMI) 40.0 and over, adult: Secondary | ICD-10-CM | POA: Diagnosis not present

## 2020-06-19 DIAGNOSIS — J449 Chronic obstructive pulmonary disease, unspecified: Secondary | ICD-10-CM | POA: Diagnosis not present

## 2020-06-19 DIAGNOSIS — J9621 Acute and chronic respiratory failure with hypoxia: Secondary | ICD-10-CM | POA: Diagnosis not present

## 2020-06-19 DIAGNOSIS — E1142 Type 2 diabetes mellitus with diabetic polyneuropathy: Secondary | ICD-10-CM | POA: Diagnosis not present

## 2020-06-19 DIAGNOSIS — F17210 Nicotine dependence, cigarettes, uncomplicated: Secondary | ICD-10-CM | POA: Diagnosis not present

## 2020-06-19 DIAGNOSIS — Z6841 Body Mass Index (BMI) 40.0 and over, adult: Secondary | ICD-10-CM | POA: Diagnosis not present

## 2020-06-20 DIAGNOSIS — E1142 Type 2 diabetes mellitus with diabetic polyneuropathy: Secondary | ICD-10-CM | POA: Diagnosis not present

## 2020-06-20 DIAGNOSIS — J9621 Acute and chronic respiratory failure with hypoxia: Secondary | ICD-10-CM | POA: Diagnosis not present

## 2020-06-20 DIAGNOSIS — Z6841 Body Mass Index (BMI) 40.0 and over, adult: Secondary | ICD-10-CM | POA: Diagnosis not present

## 2020-06-20 DIAGNOSIS — F17210 Nicotine dependence, cigarettes, uncomplicated: Secondary | ICD-10-CM | POA: Diagnosis not present

## 2020-06-20 DIAGNOSIS — J449 Chronic obstructive pulmonary disease, unspecified: Secondary | ICD-10-CM | POA: Diagnosis not present

## 2020-06-23 DIAGNOSIS — Z6841 Body Mass Index (BMI) 40.0 and over, adult: Secondary | ICD-10-CM | POA: Diagnosis not present

## 2020-06-23 DIAGNOSIS — E1142 Type 2 diabetes mellitus with diabetic polyneuropathy: Secondary | ICD-10-CM | POA: Diagnosis not present

## 2020-06-23 DIAGNOSIS — J449 Chronic obstructive pulmonary disease, unspecified: Secondary | ICD-10-CM | POA: Diagnosis not present

## 2020-06-23 DIAGNOSIS — J9621 Acute and chronic respiratory failure with hypoxia: Secondary | ICD-10-CM | POA: Diagnosis not present

## 2020-06-23 DIAGNOSIS — F17210 Nicotine dependence, cigarettes, uncomplicated: Secondary | ICD-10-CM | POA: Diagnosis not present

## 2020-06-25 DIAGNOSIS — R32 Unspecified urinary incontinence: Secondary | ICD-10-CM | POA: Diagnosis not present

## 2020-06-25 DIAGNOSIS — S4291XS Fracture of right shoulder girdle, part unspecified, sequela: Secondary | ICD-10-CM | POA: Diagnosis not present

## 2020-06-25 DIAGNOSIS — J9621 Acute and chronic respiratory failure with hypoxia: Secondary | ICD-10-CM | POA: Diagnosis not present

## 2020-06-25 DIAGNOSIS — R159 Full incontinence of feces: Secondary | ICD-10-CM | POA: Diagnosis not present

## 2020-06-25 DIAGNOSIS — E1142 Type 2 diabetes mellitus with diabetic polyneuropathy: Secondary | ICD-10-CM | POA: Diagnosis not present

## 2020-06-25 DIAGNOSIS — K5900 Constipation, unspecified: Secondary | ICD-10-CM | POA: Diagnosis not present

## 2020-06-25 DIAGNOSIS — J449 Chronic obstructive pulmonary disease, unspecified: Secondary | ICD-10-CM | POA: Diagnosis not present

## 2020-06-25 DIAGNOSIS — Z6841 Body Mass Index (BMI) 40.0 and over, adult: Secondary | ICD-10-CM | POA: Diagnosis not present

## 2020-06-25 DIAGNOSIS — I10 Essential (primary) hypertension: Secondary | ICD-10-CM | POA: Diagnosis not present

## 2020-06-25 DIAGNOSIS — F17210 Nicotine dependence, cigarettes, uncomplicated: Secondary | ICD-10-CM | POA: Diagnosis not present

## 2020-06-25 DIAGNOSIS — F32A Depression, unspecified: Secondary | ICD-10-CM | POA: Diagnosis not present

## 2020-06-25 DIAGNOSIS — Z741 Need for assistance with personal care: Secondary | ICD-10-CM | POA: Diagnosis not present

## 2020-06-25 DIAGNOSIS — Z9981 Dependence on supplemental oxygen: Secondary | ICD-10-CM | POA: Diagnosis not present

## 2020-06-25 DIAGNOSIS — Z794 Long term (current) use of insulin: Secondary | ICD-10-CM | POA: Diagnosis not present

## 2020-06-25 DIAGNOSIS — H409 Unspecified glaucoma: Secondary | ICD-10-CM | POA: Diagnosis not present

## 2020-06-26 DIAGNOSIS — E1142 Type 2 diabetes mellitus with diabetic polyneuropathy: Secondary | ICD-10-CM | POA: Diagnosis not present

## 2020-06-26 DIAGNOSIS — J449 Chronic obstructive pulmonary disease, unspecified: Secondary | ICD-10-CM | POA: Diagnosis not present

## 2020-06-26 DIAGNOSIS — F17210 Nicotine dependence, cigarettes, uncomplicated: Secondary | ICD-10-CM | POA: Diagnosis not present

## 2020-06-26 DIAGNOSIS — J9621 Acute and chronic respiratory failure with hypoxia: Secondary | ICD-10-CM | POA: Diagnosis not present

## 2020-06-26 DIAGNOSIS — Z6841 Body Mass Index (BMI) 40.0 and over, adult: Secondary | ICD-10-CM | POA: Diagnosis not present

## 2020-06-27 DIAGNOSIS — F17210 Nicotine dependence, cigarettes, uncomplicated: Secondary | ICD-10-CM | POA: Diagnosis not present

## 2020-06-27 DIAGNOSIS — Z6841 Body Mass Index (BMI) 40.0 and over, adult: Secondary | ICD-10-CM | POA: Diagnosis not present

## 2020-06-27 DIAGNOSIS — J9621 Acute and chronic respiratory failure with hypoxia: Secondary | ICD-10-CM | POA: Diagnosis not present

## 2020-06-27 DIAGNOSIS — E1142 Type 2 diabetes mellitus with diabetic polyneuropathy: Secondary | ICD-10-CM | POA: Diagnosis not present

## 2020-06-27 DIAGNOSIS — J449 Chronic obstructive pulmonary disease, unspecified: Secondary | ICD-10-CM | POA: Diagnosis not present

## 2020-06-30 DIAGNOSIS — J449 Chronic obstructive pulmonary disease, unspecified: Secondary | ICD-10-CM | POA: Diagnosis not present

## 2020-06-30 DIAGNOSIS — E1142 Type 2 diabetes mellitus with diabetic polyneuropathy: Secondary | ICD-10-CM | POA: Diagnosis not present

## 2020-06-30 DIAGNOSIS — Z6841 Body Mass Index (BMI) 40.0 and over, adult: Secondary | ICD-10-CM | POA: Diagnosis not present

## 2020-06-30 DIAGNOSIS — F17210 Nicotine dependence, cigarettes, uncomplicated: Secondary | ICD-10-CM | POA: Diagnosis not present

## 2020-06-30 DIAGNOSIS — J9621 Acute and chronic respiratory failure with hypoxia: Secondary | ICD-10-CM | POA: Diagnosis not present

## 2020-07-02 DIAGNOSIS — J9621 Acute and chronic respiratory failure with hypoxia: Secondary | ICD-10-CM | POA: Diagnosis not present

## 2020-07-02 DIAGNOSIS — E1142 Type 2 diabetes mellitus with diabetic polyneuropathy: Secondary | ICD-10-CM | POA: Diagnosis not present

## 2020-07-02 DIAGNOSIS — J449 Chronic obstructive pulmonary disease, unspecified: Secondary | ICD-10-CM | POA: Diagnosis not present

## 2020-07-02 DIAGNOSIS — F17210 Nicotine dependence, cigarettes, uncomplicated: Secondary | ICD-10-CM | POA: Diagnosis not present

## 2020-07-02 DIAGNOSIS — Z6841 Body Mass Index (BMI) 40.0 and over, adult: Secondary | ICD-10-CM | POA: Diagnosis not present

## 2020-07-03 DIAGNOSIS — J9621 Acute and chronic respiratory failure with hypoxia: Secondary | ICD-10-CM | POA: Diagnosis not present

## 2020-07-03 DIAGNOSIS — Z6841 Body Mass Index (BMI) 40.0 and over, adult: Secondary | ICD-10-CM | POA: Diagnosis not present

## 2020-07-03 DIAGNOSIS — E1142 Type 2 diabetes mellitus with diabetic polyneuropathy: Secondary | ICD-10-CM | POA: Diagnosis not present

## 2020-07-03 DIAGNOSIS — J449 Chronic obstructive pulmonary disease, unspecified: Secondary | ICD-10-CM | POA: Diagnosis not present

## 2020-07-03 DIAGNOSIS — F17210 Nicotine dependence, cigarettes, uncomplicated: Secondary | ICD-10-CM | POA: Diagnosis not present

## 2020-07-04 DIAGNOSIS — J449 Chronic obstructive pulmonary disease, unspecified: Secondary | ICD-10-CM | POA: Diagnosis not present

## 2020-07-04 DIAGNOSIS — F17210 Nicotine dependence, cigarettes, uncomplicated: Secondary | ICD-10-CM | POA: Diagnosis not present

## 2020-07-04 DIAGNOSIS — Z6841 Body Mass Index (BMI) 40.0 and over, adult: Secondary | ICD-10-CM | POA: Diagnosis not present

## 2020-07-04 DIAGNOSIS — J9621 Acute and chronic respiratory failure with hypoxia: Secondary | ICD-10-CM | POA: Diagnosis not present

## 2020-07-04 DIAGNOSIS — E1142 Type 2 diabetes mellitus with diabetic polyneuropathy: Secondary | ICD-10-CM | POA: Diagnosis not present

## 2020-07-07 DIAGNOSIS — J9621 Acute and chronic respiratory failure with hypoxia: Secondary | ICD-10-CM | POA: Diagnosis not present

## 2020-07-07 DIAGNOSIS — E1142 Type 2 diabetes mellitus with diabetic polyneuropathy: Secondary | ICD-10-CM | POA: Diagnosis not present

## 2020-07-07 DIAGNOSIS — Z6841 Body Mass Index (BMI) 40.0 and over, adult: Secondary | ICD-10-CM | POA: Diagnosis not present

## 2020-07-07 DIAGNOSIS — J449 Chronic obstructive pulmonary disease, unspecified: Secondary | ICD-10-CM | POA: Diagnosis not present

## 2020-07-07 DIAGNOSIS — F17210 Nicotine dependence, cigarettes, uncomplicated: Secondary | ICD-10-CM | POA: Diagnosis not present

## 2020-07-08 DIAGNOSIS — J9621 Acute and chronic respiratory failure with hypoxia: Secondary | ICD-10-CM | POA: Diagnosis not present

## 2020-07-08 DIAGNOSIS — F17210 Nicotine dependence, cigarettes, uncomplicated: Secondary | ICD-10-CM | POA: Diagnosis not present

## 2020-07-08 DIAGNOSIS — J449 Chronic obstructive pulmonary disease, unspecified: Secondary | ICD-10-CM | POA: Diagnosis not present

## 2020-07-08 DIAGNOSIS — Z6841 Body Mass Index (BMI) 40.0 and over, adult: Secondary | ICD-10-CM | POA: Diagnosis not present

## 2020-07-08 DIAGNOSIS — E1142 Type 2 diabetes mellitus with diabetic polyneuropathy: Secondary | ICD-10-CM | POA: Diagnosis not present

## 2020-07-09 DIAGNOSIS — Z6841 Body Mass Index (BMI) 40.0 and over, adult: Secondary | ICD-10-CM | POA: Diagnosis not present

## 2020-07-09 DIAGNOSIS — E1142 Type 2 diabetes mellitus with diabetic polyneuropathy: Secondary | ICD-10-CM | POA: Diagnosis not present

## 2020-07-09 DIAGNOSIS — F17210 Nicotine dependence, cigarettes, uncomplicated: Secondary | ICD-10-CM | POA: Diagnosis not present

## 2020-07-09 DIAGNOSIS — J9621 Acute and chronic respiratory failure with hypoxia: Secondary | ICD-10-CM | POA: Diagnosis not present

## 2020-07-09 DIAGNOSIS — J449 Chronic obstructive pulmonary disease, unspecified: Secondary | ICD-10-CM | POA: Diagnosis not present

## 2020-07-11 DIAGNOSIS — J449 Chronic obstructive pulmonary disease, unspecified: Secondary | ICD-10-CM | POA: Diagnosis not present

## 2020-07-11 DIAGNOSIS — J9621 Acute and chronic respiratory failure with hypoxia: Secondary | ICD-10-CM | POA: Diagnosis not present

## 2020-07-11 DIAGNOSIS — F17210 Nicotine dependence, cigarettes, uncomplicated: Secondary | ICD-10-CM | POA: Diagnosis not present

## 2020-07-11 DIAGNOSIS — Z6841 Body Mass Index (BMI) 40.0 and over, adult: Secondary | ICD-10-CM | POA: Diagnosis not present

## 2020-07-11 DIAGNOSIS — E1142 Type 2 diabetes mellitus with diabetic polyneuropathy: Secondary | ICD-10-CM | POA: Diagnosis not present

## 2020-07-14 DIAGNOSIS — E1142 Type 2 diabetes mellitus with diabetic polyneuropathy: Secondary | ICD-10-CM | POA: Diagnosis not present

## 2020-07-14 DIAGNOSIS — J449 Chronic obstructive pulmonary disease, unspecified: Secondary | ICD-10-CM | POA: Diagnosis not present

## 2020-07-14 DIAGNOSIS — J9621 Acute and chronic respiratory failure with hypoxia: Secondary | ICD-10-CM | POA: Diagnosis not present

## 2020-07-14 DIAGNOSIS — Z6841 Body Mass Index (BMI) 40.0 and over, adult: Secondary | ICD-10-CM | POA: Diagnosis not present

## 2020-07-14 DIAGNOSIS — F17210 Nicotine dependence, cigarettes, uncomplicated: Secondary | ICD-10-CM | POA: Diagnosis not present

## 2020-07-16 DIAGNOSIS — F17210 Nicotine dependence, cigarettes, uncomplicated: Secondary | ICD-10-CM | POA: Diagnosis not present

## 2020-07-16 DIAGNOSIS — E1142 Type 2 diabetes mellitus with diabetic polyneuropathy: Secondary | ICD-10-CM | POA: Diagnosis not present

## 2020-07-16 DIAGNOSIS — J449 Chronic obstructive pulmonary disease, unspecified: Secondary | ICD-10-CM | POA: Diagnosis not present

## 2020-07-16 DIAGNOSIS — J9621 Acute and chronic respiratory failure with hypoxia: Secondary | ICD-10-CM | POA: Diagnosis not present

## 2020-07-16 DIAGNOSIS — Z6841 Body Mass Index (BMI) 40.0 and over, adult: Secondary | ICD-10-CM | POA: Diagnosis not present

## 2020-07-17 DIAGNOSIS — J9621 Acute and chronic respiratory failure with hypoxia: Secondary | ICD-10-CM | POA: Diagnosis not present

## 2020-07-17 DIAGNOSIS — F17210 Nicotine dependence, cigarettes, uncomplicated: Secondary | ICD-10-CM | POA: Diagnosis not present

## 2020-07-17 DIAGNOSIS — Z6841 Body Mass Index (BMI) 40.0 and over, adult: Secondary | ICD-10-CM | POA: Diagnosis not present

## 2020-07-17 DIAGNOSIS — J449 Chronic obstructive pulmonary disease, unspecified: Secondary | ICD-10-CM | POA: Diagnosis not present

## 2020-07-17 DIAGNOSIS — E1142 Type 2 diabetes mellitus with diabetic polyneuropathy: Secondary | ICD-10-CM | POA: Diagnosis not present

## 2020-07-18 DIAGNOSIS — Z6841 Body Mass Index (BMI) 40.0 and over, adult: Secondary | ICD-10-CM | POA: Diagnosis not present

## 2020-07-18 DIAGNOSIS — E1142 Type 2 diabetes mellitus with diabetic polyneuropathy: Secondary | ICD-10-CM | POA: Diagnosis not present

## 2020-07-18 DIAGNOSIS — J9621 Acute and chronic respiratory failure with hypoxia: Secondary | ICD-10-CM | POA: Diagnosis not present

## 2020-07-18 DIAGNOSIS — J449 Chronic obstructive pulmonary disease, unspecified: Secondary | ICD-10-CM | POA: Diagnosis not present

## 2020-07-18 DIAGNOSIS — F17210 Nicotine dependence, cigarettes, uncomplicated: Secondary | ICD-10-CM | POA: Diagnosis not present

## 2020-07-21 DIAGNOSIS — Z6841 Body Mass Index (BMI) 40.0 and over, adult: Secondary | ICD-10-CM | POA: Diagnosis not present

## 2020-07-21 DIAGNOSIS — J449 Chronic obstructive pulmonary disease, unspecified: Secondary | ICD-10-CM | POA: Diagnosis not present

## 2020-07-21 DIAGNOSIS — J9621 Acute and chronic respiratory failure with hypoxia: Secondary | ICD-10-CM | POA: Diagnosis not present

## 2020-07-21 DIAGNOSIS — E1142 Type 2 diabetes mellitus with diabetic polyneuropathy: Secondary | ICD-10-CM | POA: Diagnosis not present

## 2020-07-21 DIAGNOSIS — F17210 Nicotine dependence, cigarettes, uncomplicated: Secondary | ICD-10-CM | POA: Diagnosis not present

## 2020-07-23 DIAGNOSIS — F17210 Nicotine dependence, cigarettes, uncomplicated: Secondary | ICD-10-CM | POA: Diagnosis not present

## 2020-07-23 DIAGNOSIS — J9621 Acute and chronic respiratory failure with hypoxia: Secondary | ICD-10-CM | POA: Diagnosis not present

## 2020-07-23 DIAGNOSIS — Z6841 Body Mass Index (BMI) 40.0 and over, adult: Secondary | ICD-10-CM | POA: Diagnosis not present

## 2020-07-23 DIAGNOSIS — E1142 Type 2 diabetes mellitus with diabetic polyneuropathy: Secondary | ICD-10-CM | POA: Diagnosis not present

## 2020-07-23 DIAGNOSIS — J449 Chronic obstructive pulmonary disease, unspecified: Secondary | ICD-10-CM | POA: Diagnosis not present

## 2020-07-24 DIAGNOSIS — F17210 Nicotine dependence, cigarettes, uncomplicated: Secondary | ICD-10-CM | POA: Diagnosis not present

## 2020-07-24 DIAGNOSIS — Z6841 Body Mass Index (BMI) 40.0 and over, adult: Secondary | ICD-10-CM | POA: Diagnosis not present

## 2020-07-24 DIAGNOSIS — E1142 Type 2 diabetes mellitus with diabetic polyneuropathy: Secondary | ICD-10-CM | POA: Diagnosis not present

## 2020-07-24 DIAGNOSIS — J9621 Acute and chronic respiratory failure with hypoxia: Secondary | ICD-10-CM | POA: Diagnosis not present

## 2020-07-24 DIAGNOSIS — J449 Chronic obstructive pulmonary disease, unspecified: Secondary | ICD-10-CM | POA: Diagnosis not present

## 2020-07-25 DIAGNOSIS — F17210 Nicotine dependence, cigarettes, uncomplicated: Secondary | ICD-10-CM | POA: Diagnosis not present

## 2020-07-25 DIAGNOSIS — H409 Unspecified glaucoma: Secondary | ICD-10-CM | POA: Diagnosis not present

## 2020-07-25 DIAGNOSIS — Z9981 Dependence on supplemental oxygen: Secondary | ICD-10-CM | POA: Diagnosis not present

## 2020-07-25 DIAGNOSIS — I10 Essential (primary) hypertension: Secondary | ICD-10-CM | POA: Diagnosis not present

## 2020-07-25 DIAGNOSIS — Z6841 Body Mass Index (BMI) 40.0 and over, adult: Secondary | ICD-10-CM | POA: Diagnosis not present

## 2020-07-25 DIAGNOSIS — K5900 Constipation, unspecified: Secondary | ICD-10-CM | POA: Diagnosis not present

## 2020-07-25 DIAGNOSIS — F32A Depression, unspecified: Secondary | ICD-10-CM | POA: Diagnosis not present

## 2020-07-25 DIAGNOSIS — R32 Unspecified urinary incontinence: Secondary | ICD-10-CM | POA: Diagnosis not present

## 2020-07-25 DIAGNOSIS — J449 Chronic obstructive pulmonary disease, unspecified: Secondary | ICD-10-CM | POA: Diagnosis not present

## 2020-07-25 DIAGNOSIS — S4291XS Fracture of right shoulder girdle, part unspecified, sequela: Secondary | ICD-10-CM | POA: Diagnosis not present

## 2020-07-25 DIAGNOSIS — J9621 Acute and chronic respiratory failure with hypoxia: Secondary | ICD-10-CM | POA: Diagnosis not present

## 2020-07-25 DIAGNOSIS — Z794 Long term (current) use of insulin: Secondary | ICD-10-CM | POA: Diagnosis not present

## 2020-07-25 DIAGNOSIS — Z741 Need for assistance with personal care: Secondary | ICD-10-CM | POA: Diagnosis not present

## 2020-07-25 DIAGNOSIS — E1142 Type 2 diabetes mellitus with diabetic polyneuropathy: Secondary | ICD-10-CM | POA: Diagnosis not present

## 2020-07-25 DIAGNOSIS — R159 Full incontinence of feces: Secondary | ICD-10-CM | POA: Diagnosis not present

## 2020-07-28 DIAGNOSIS — E1142 Type 2 diabetes mellitus with diabetic polyneuropathy: Secondary | ICD-10-CM | POA: Diagnosis not present

## 2020-07-28 DIAGNOSIS — Z6841 Body Mass Index (BMI) 40.0 and over, adult: Secondary | ICD-10-CM | POA: Diagnosis not present

## 2020-07-28 DIAGNOSIS — F17210 Nicotine dependence, cigarettes, uncomplicated: Secondary | ICD-10-CM | POA: Diagnosis not present

## 2020-07-28 DIAGNOSIS — J449 Chronic obstructive pulmonary disease, unspecified: Secondary | ICD-10-CM | POA: Diagnosis not present

## 2020-07-28 DIAGNOSIS — J9621 Acute and chronic respiratory failure with hypoxia: Secondary | ICD-10-CM | POA: Diagnosis not present

## 2020-07-30 DIAGNOSIS — Z6841 Body Mass Index (BMI) 40.0 and over, adult: Secondary | ICD-10-CM | POA: Diagnosis not present

## 2020-07-30 DIAGNOSIS — J449 Chronic obstructive pulmonary disease, unspecified: Secondary | ICD-10-CM | POA: Diagnosis not present

## 2020-07-30 DIAGNOSIS — E1142 Type 2 diabetes mellitus with diabetic polyneuropathy: Secondary | ICD-10-CM | POA: Diagnosis not present

## 2020-07-30 DIAGNOSIS — J9621 Acute and chronic respiratory failure with hypoxia: Secondary | ICD-10-CM | POA: Diagnosis not present

## 2020-07-30 DIAGNOSIS — F17210 Nicotine dependence, cigarettes, uncomplicated: Secondary | ICD-10-CM | POA: Diagnosis not present

## 2020-07-31 DIAGNOSIS — F17210 Nicotine dependence, cigarettes, uncomplicated: Secondary | ICD-10-CM | POA: Diagnosis not present

## 2020-07-31 DIAGNOSIS — E1142 Type 2 diabetes mellitus with diabetic polyneuropathy: Secondary | ICD-10-CM | POA: Diagnosis not present

## 2020-07-31 DIAGNOSIS — Z6841 Body Mass Index (BMI) 40.0 and over, adult: Secondary | ICD-10-CM | POA: Diagnosis not present

## 2020-07-31 DIAGNOSIS — J449 Chronic obstructive pulmonary disease, unspecified: Secondary | ICD-10-CM | POA: Diagnosis not present

## 2020-07-31 DIAGNOSIS — J9621 Acute and chronic respiratory failure with hypoxia: Secondary | ICD-10-CM | POA: Diagnosis not present

## 2020-08-01 DIAGNOSIS — F17210 Nicotine dependence, cigarettes, uncomplicated: Secondary | ICD-10-CM | POA: Diagnosis not present

## 2020-08-01 DIAGNOSIS — E1142 Type 2 diabetes mellitus with diabetic polyneuropathy: Secondary | ICD-10-CM | POA: Diagnosis not present

## 2020-08-01 DIAGNOSIS — J9621 Acute and chronic respiratory failure with hypoxia: Secondary | ICD-10-CM | POA: Diagnosis not present

## 2020-08-01 DIAGNOSIS — Z6841 Body Mass Index (BMI) 40.0 and over, adult: Secondary | ICD-10-CM | POA: Diagnosis not present

## 2020-08-01 DIAGNOSIS — J449 Chronic obstructive pulmonary disease, unspecified: Secondary | ICD-10-CM | POA: Diagnosis not present

## 2020-08-04 DIAGNOSIS — F17210 Nicotine dependence, cigarettes, uncomplicated: Secondary | ICD-10-CM | POA: Diagnosis not present

## 2020-08-04 DIAGNOSIS — J449 Chronic obstructive pulmonary disease, unspecified: Secondary | ICD-10-CM | POA: Diagnosis not present

## 2020-08-04 DIAGNOSIS — Z6841 Body Mass Index (BMI) 40.0 and over, adult: Secondary | ICD-10-CM | POA: Diagnosis not present

## 2020-08-04 DIAGNOSIS — E1142 Type 2 diabetes mellitus with diabetic polyneuropathy: Secondary | ICD-10-CM | POA: Diagnosis not present

## 2020-08-04 DIAGNOSIS — J9621 Acute and chronic respiratory failure with hypoxia: Secondary | ICD-10-CM | POA: Diagnosis not present

## 2020-08-06 DIAGNOSIS — F17210 Nicotine dependence, cigarettes, uncomplicated: Secondary | ICD-10-CM | POA: Diagnosis not present

## 2020-08-06 DIAGNOSIS — E1142 Type 2 diabetes mellitus with diabetic polyneuropathy: Secondary | ICD-10-CM | POA: Diagnosis not present

## 2020-08-06 DIAGNOSIS — J449 Chronic obstructive pulmonary disease, unspecified: Secondary | ICD-10-CM | POA: Diagnosis not present

## 2020-08-06 DIAGNOSIS — Z6841 Body Mass Index (BMI) 40.0 and over, adult: Secondary | ICD-10-CM | POA: Diagnosis not present

## 2020-08-06 DIAGNOSIS — J9621 Acute and chronic respiratory failure with hypoxia: Secondary | ICD-10-CM | POA: Diagnosis not present

## 2020-08-07 DIAGNOSIS — J9621 Acute and chronic respiratory failure with hypoxia: Secondary | ICD-10-CM | POA: Diagnosis not present

## 2020-08-07 DIAGNOSIS — Z6841 Body Mass Index (BMI) 40.0 and over, adult: Secondary | ICD-10-CM | POA: Diagnosis not present

## 2020-08-07 DIAGNOSIS — J449 Chronic obstructive pulmonary disease, unspecified: Secondary | ICD-10-CM | POA: Diagnosis not present

## 2020-08-07 DIAGNOSIS — F17210 Nicotine dependence, cigarettes, uncomplicated: Secondary | ICD-10-CM | POA: Diagnosis not present

## 2020-08-07 DIAGNOSIS — E1142 Type 2 diabetes mellitus with diabetic polyneuropathy: Secondary | ICD-10-CM | POA: Diagnosis not present

## 2020-08-08 DIAGNOSIS — F17210 Nicotine dependence, cigarettes, uncomplicated: Secondary | ICD-10-CM | POA: Diagnosis not present

## 2020-08-08 DIAGNOSIS — Z6841 Body Mass Index (BMI) 40.0 and over, adult: Secondary | ICD-10-CM | POA: Diagnosis not present

## 2020-08-08 DIAGNOSIS — J9621 Acute and chronic respiratory failure with hypoxia: Secondary | ICD-10-CM | POA: Diagnosis not present

## 2020-08-08 DIAGNOSIS — J449 Chronic obstructive pulmonary disease, unspecified: Secondary | ICD-10-CM | POA: Diagnosis not present

## 2020-08-08 DIAGNOSIS — E1142 Type 2 diabetes mellitus with diabetic polyneuropathy: Secondary | ICD-10-CM | POA: Diagnosis not present

## 2020-08-11 DIAGNOSIS — J9621 Acute and chronic respiratory failure with hypoxia: Secondary | ICD-10-CM | POA: Diagnosis not present

## 2020-08-11 DIAGNOSIS — E1142 Type 2 diabetes mellitus with diabetic polyneuropathy: Secondary | ICD-10-CM | POA: Diagnosis not present

## 2020-08-11 DIAGNOSIS — Z6841 Body Mass Index (BMI) 40.0 and over, adult: Secondary | ICD-10-CM | POA: Diagnosis not present

## 2020-08-11 DIAGNOSIS — J449 Chronic obstructive pulmonary disease, unspecified: Secondary | ICD-10-CM | POA: Diagnosis not present

## 2020-08-11 DIAGNOSIS — F17210 Nicotine dependence, cigarettes, uncomplicated: Secondary | ICD-10-CM | POA: Diagnosis not present

## 2020-08-13 DIAGNOSIS — J449 Chronic obstructive pulmonary disease, unspecified: Secondary | ICD-10-CM | POA: Diagnosis not present

## 2020-08-13 DIAGNOSIS — F17210 Nicotine dependence, cigarettes, uncomplicated: Secondary | ICD-10-CM | POA: Diagnosis not present

## 2020-08-13 DIAGNOSIS — J9621 Acute and chronic respiratory failure with hypoxia: Secondary | ICD-10-CM | POA: Diagnosis not present

## 2020-08-13 DIAGNOSIS — Z6841 Body Mass Index (BMI) 40.0 and over, adult: Secondary | ICD-10-CM | POA: Diagnosis not present

## 2020-08-13 DIAGNOSIS — E1142 Type 2 diabetes mellitus with diabetic polyneuropathy: Secondary | ICD-10-CM | POA: Diagnosis not present

## 2020-08-14 DIAGNOSIS — Z6841 Body Mass Index (BMI) 40.0 and over, adult: Secondary | ICD-10-CM | POA: Diagnosis not present

## 2020-08-14 DIAGNOSIS — E1142 Type 2 diabetes mellitus with diabetic polyneuropathy: Secondary | ICD-10-CM | POA: Diagnosis not present

## 2020-08-14 DIAGNOSIS — J449 Chronic obstructive pulmonary disease, unspecified: Secondary | ICD-10-CM | POA: Diagnosis not present

## 2020-08-14 DIAGNOSIS — J9621 Acute and chronic respiratory failure with hypoxia: Secondary | ICD-10-CM | POA: Diagnosis not present

## 2020-08-14 DIAGNOSIS — F17210 Nicotine dependence, cigarettes, uncomplicated: Secondary | ICD-10-CM | POA: Diagnosis not present

## 2020-08-15 DIAGNOSIS — Z6841 Body Mass Index (BMI) 40.0 and over, adult: Secondary | ICD-10-CM | POA: Diagnosis not present

## 2020-08-15 DIAGNOSIS — J9621 Acute and chronic respiratory failure with hypoxia: Secondary | ICD-10-CM | POA: Diagnosis not present

## 2020-08-15 DIAGNOSIS — J449 Chronic obstructive pulmonary disease, unspecified: Secondary | ICD-10-CM | POA: Diagnosis not present

## 2020-08-15 DIAGNOSIS — F17210 Nicotine dependence, cigarettes, uncomplicated: Secondary | ICD-10-CM | POA: Diagnosis not present

## 2020-08-15 DIAGNOSIS — E1142 Type 2 diabetes mellitus with diabetic polyneuropathy: Secondary | ICD-10-CM | POA: Diagnosis not present

## 2020-08-18 DIAGNOSIS — Z6841 Body Mass Index (BMI) 40.0 and over, adult: Secondary | ICD-10-CM | POA: Diagnosis not present

## 2020-08-18 DIAGNOSIS — F17210 Nicotine dependence, cigarettes, uncomplicated: Secondary | ICD-10-CM | POA: Diagnosis not present

## 2020-08-18 DIAGNOSIS — E1142 Type 2 diabetes mellitus with diabetic polyneuropathy: Secondary | ICD-10-CM | POA: Diagnosis not present

## 2020-08-18 DIAGNOSIS — J9621 Acute and chronic respiratory failure with hypoxia: Secondary | ICD-10-CM | POA: Diagnosis not present

## 2020-08-18 DIAGNOSIS — J449 Chronic obstructive pulmonary disease, unspecified: Secondary | ICD-10-CM | POA: Diagnosis not present

## 2020-08-19 DIAGNOSIS — E1142 Type 2 diabetes mellitus with diabetic polyneuropathy: Secondary | ICD-10-CM | POA: Diagnosis not present

## 2020-08-19 DIAGNOSIS — Z6841 Body Mass Index (BMI) 40.0 and over, adult: Secondary | ICD-10-CM | POA: Diagnosis not present

## 2020-08-19 DIAGNOSIS — J9621 Acute and chronic respiratory failure with hypoxia: Secondary | ICD-10-CM | POA: Diagnosis not present

## 2020-08-19 DIAGNOSIS — Z20822 Contact with and (suspected) exposure to covid-19: Secondary | ICD-10-CM | POA: Diagnosis not present

## 2020-08-19 DIAGNOSIS — F17210 Nicotine dependence, cigarettes, uncomplicated: Secondary | ICD-10-CM | POA: Diagnosis not present

## 2020-08-19 DIAGNOSIS — J449 Chronic obstructive pulmonary disease, unspecified: Secondary | ICD-10-CM | POA: Diagnosis not present

## 2020-08-20 DIAGNOSIS — J449 Chronic obstructive pulmonary disease, unspecified: Secondary | ICD-10-CM | POA: Diagnosis not present

## 2020-08-20 DIAGNOSIS — Z6841 Body Mass Index (BMI) 40.0 and over, adult: Secondary | ICD-10-CM | POA: Diagnosis not present

## 2020-08-20 DIAGNOSIS — E1142 Type 2 diabetes mellitus with diabetic polyneuropathy: Secondary | ICD-10-CM | POA: Diagnosis not present

## 2020-08-20 DIAGNOSIS — J9621 Acute and chronic respiratory failure with hypoxia: Secondary | ICD-10-CM | POA: Diagnosis not present

## 2020-08-20 DIAGNOSIS — F17210 Nicotine dependence, cigarettes, uncomplicated: Secondary | ICD-10-CM | POA: Diagnosis not present

## 2020-08-21 DIAGNOSIS — E1142 Type 2 diabetes mellitus with diabetic polyneuropathy: Secondary | ICD-10-CM | POA: Diagnosis not present

## 2020-08-21 DIAGNOSIS — F17210 Nicotine dependence, cigarettes, uncomplicated: Secondary | ICD-10-CM | POA: Diagnosis not present

## 2020-08-21 DIAGNOSIS — J9621 Acute and chronic respiratory failure with hypoxia: Secondary | ICD-10-CM | POA: Diagnosis not present

## 2020-08-21 DIAGNOSIS — J449 Chronic obstructive pulmonary disease, unspecified: Secondary | ICD-10-CM | POA: Diagnosis not present

## 2020-08-21 DIAGNOSIS — Z6841 Body Mass Index (BMI) 40.0 and over, adult: Secondary | ICD-10-CM | POA: Diagnosis not present

## 2020-08-22 DIAGNOSIS — J9621 Acute and chronic respiratory failure with hypoxia: Secondary | ICD-10-CM | POA: Diagnosis not present

## 2020-08-22 DIAGNOSIS — Z6841 Body Mass Index (BMI) 40.0 and over, adult: Secondary | ICD-10-CM | POA: Diagnosis not present

## 2020-08-22 DIAGNOSIS — E1142 Type 2 diabetes mellitus with diabetic polyneuropathy: Secondary | ICD-10-CM | POA: Diagnosis not present

## 2020-08-22 DIAGNOSIS — F17210 Nicotine dependence, cigarettes, uncomplicated: Secondary | ICD-10-CM | POA: Diagnosis not present

## 2020-08-22 DIAGNOSIS — J449 Chronic obstructive pulmonary disease, unspecified: Secondary | ICD-10-CM | POA: Diagnosis not present

## 2020-08-25 DIAGNOSIS — R32 Unspecified urinary incontinence: Secondary | ICD-10-CM | POA: Diagnosis not present

## 2020-08-25 DIAGNOSIS — S4291XS Fracture of right shoulder girdle, part unspecified, sequela: Secondary | ICD-10-CM | POA: Diagnosis not present

## 2020-08-25 DIAGNOSIS — F17210 Nicotine dependence, cigarettes, uncomplicated: Secondary | ICD-10-CM | POA: Diagnosis not present

## 2020-08-25 DIAGNOSIS — Z741 Need for assistance with personal care: Secondary | ICD-10-CM | POA: Diagnosis not present

## 2020-08-25 DIAGNOSIS — R251 Tremor, unspecified: Secondary | ICD-10-CM | POA: Diagnosis not present

## 2020-08-25 DIAGNOSIS — J449 Chronic obstructive pulmonary disease, unspecified: Secondary | ICD-10-CM | POA: Diagnosis not present

## 2020-08-25 DIAGNOSIS — E1142 Type 2 diabetes mellitus with diabetic polyneuropathy: Secondary | ICD-10-CM | POA: Diagnosis not present

## 2020-08-25 DIAGNOSIS — F32A Depression, unspecified: Secondary | ICD-10-CM | POA: Diagnosis not present

## 2020-08-25 DIAGNOSIS — H409 Unspecified glaucoma: Secondary | ICD-10-CM | POA: Diagnosis not present

## 2020-08-25 DIAGNOSIS — Z20822 Contact with and (suspected) exposure to covid-19: Secondary | ICD-10-CM | POA: Diagnosis not present

## 2020-08-25 DIAGNOSIS — F259 Schizoaffective disorder, unspecified: Secondary | ICD-10-CM | POA: Diagnosis not present

## 2020-08-25 DIAGNOSIS — R159 Full incontinence of feces: Secondary | ICD-10-CM | POA: Diagnosis not present

## 2020-08-25 DIAGNOSIS — J9621 Acute and chronic respiratory failure with hypoxia: Secondary | ICD-10-CM | POA: Diagnosis not present

## 2020-08-25 DIAGNOSIS — Z6841 Body Mass Index (BMI) 40.0 and over, adult: Secondary | ICD-10-CM | POA: Diagnosis not present

## 2020-08-25 DIAGNOSIS — Z794 Long term (current) use of insulin: Secondary | ICD-10-CM | POA: Diagnosis not present

## 2020-08-25 DIAGNOSIS — K5900 Constipation, unspecified: Secondary | ICD-10-CM | POA: Diagnosis not present

## 2020-08-25 DIAGNOSIS — Z9981 Dependence on supplemental oxygen: Secondary | ICD-10-CM | POA: Diagnosis not present

## 2020-08-25 DIAGNOSIS — I10 Essential (primary) hypertension: Secondary | ICD-10-CM | POA: Diagnosis not present

## 2020-08-27 DIAGNOSIS — E1142 Type 2 diabetes mellitus with diabetic polyneuropathy: Secondary | ICD-10-CM | POA: Diagnosis not present

## 2020-08-27 DIAGNOSIS — F32A Depression, unspecified: Secondary | ICD-10-CM | POA: Diagnosis not present

## 2020-08-27 DIAGNOSIS — J449 Chronic obstructive pulmonary disease, unspecified: Secondary | ICD-10-CM | POA: Diagnosis not present

## 2020-08-27 DIAGNOSIS — F17210 Nicotine dependence, cigarettes, uncomplicated: Secondary | ICD-10-CM | POA: Diagnosis not present

## 2020-08-27 DIAGNOSIS — J9621 Acute and chronic respiratory failure with hypoxia: Secondary | ICD-10-CM | POA: Diagnosis not present

## 2020-08-28 DIAGNOSIS — J449 Chronic obstructive pulmonary disease, unspecified: Secondary | ICD-10-CM | POA: Diagnosis not present

## 2020-08-28 DIAGNOSIS — F17210 Nicotine dependence, cigarettes, uncomplicated: Secondary | ICD-10-CM | POA: Diagnosis not present

## 2020-08-28 DIAGNOSIS — E1142 Type 2 diabetes mellitus with diabetic polyneuropathy: Secondary | ICD-10-CM | POA: Diagnosis not present

## 2020-08-28 DIAGNOSIS — J9621 Acute and chronic respiratory failure with hypoxia: Secondary | ICD-10-CM | POA: Diagnosis not present

## 2020-08-28 DIAGNOSIS — F32A Depression, unspecified: Secondary | ICD-10-CM | POA: Diagnosis not present

## 2020-08-29 DIAGNOSIS — J449 Chronic obstructive pulmonary disease, unspecified: Secondary | ICD-10-CM | POA: Diagnosis not present

## 2020-08-29 DIAGNOSIS — J9621 Acute and chronic respiratory failure with hypoxia: Secondary | ICD-10-CM | POA: Diagnosis not present

## 2020-08-29 DIAGNOSIS — F32A Depression, unspecified: Secondary | ICD-10-CM | POA: Diagnosis not present

## 2020-08-29 DIAGNOSIS — F17210 Nicotine dependence, cigarettes, uncomplicated: Secondary | ICD-10-CM | POA: Diagnosis not present

## 2020-08-29 DIAGNOSIS — E1142 Type 2 diabetes mellitus with diabetic polyneuropathy: Secondary | ICD-10-CM | POA: Diagnosis not present

## 2020-09-01 DIAGNOSIS — E1142 Type 2 diabetes mellitus with diabetic polyneuropathy: Secondary | ICD-10-CM | POA: Diagnosis not present

## 2020-09-01 DIAGNOSIS — J9621 Acute and chronic respiratory failure with hypoxia: Secondary | ICD-10-CM | POA: Diagnosis not present

## 2020-09-01 DIAGNOSIS — F17210 Nicotine dependence, cigarettes, uncomplicated: Secondary | ICD-10-CM | POA: Diagnosis not present

## 2020-09-01 DIAGNOSIS — F32A Depression, unspecified: Secondary | ICD-10-CM | POA: Diagnosis not present

## 2020-09-01 DIAGNOSIS — J449 Chronic obstructive pulmonary disease, unspecified: Secondary | ICD-10-CM | POA: Diagnosis not present

## 2020-09-03 DIAGNOSIS — J449 Chronic obstructive pulmonary disease, unspecified: Secondary | ICD-10-CM | POA: Diagnosis not present

## 2020-09-03 DIAGNOSIS — J9621 Acute and chronic respiratory failure with hypoxia: Secondary | ICD-10-CM | POA: Diagnosis not present

## 2020-09-03 DIAGNOSIS — E1142 Type 2 diabetes mellitus with diabetic polyneuropathy: Secondary | ICD-10-CM | POA: Diagnosis not present

## 2020-09-03 DIAGNOSIS — F32A Depression, unspecified: Secondary | ICD-10-CM | POA: Diagnosis not present

## 2020-09-03 DIAGNOSIS — F17210 Nicotine dependence, cigarettes, uncomplicated: Secondary | ICD-10-CM | POA: Diagnosis not present

## 2020-09-04 DIAGNOSIS — E1142 Type 2 diabetes mellitus with diabetic polyneuropathy: Secondary | ICD-10-CM | POA: Diagnosis not present

## 2020-09-04 DIAGNOSIS — F17210 Nicotine dependence, cigarettes, uncomplicated: Secondary | ICD-10-CM | POA: Diagnosis not present

## 2020-09-04 DIAGNOSIS — J449 Chronic obstructive pulmonary disease, unspecified: Secondary | ICD-10-CM | POA: Diagnosis not present

## 2020-09-04 DIAGNOSIS — F32A Depression, unspecified: Secondary | ICD-10-CM | POA: Diagnosis not present

## 2020-09-04 DIAGNOSIS — J9621 Acute and chronic respiratory failure with hypoxia: Secondary | ICD-10-CM | POA: Diagnosis not present

## 2020-09-05 DIAGNOSIS — J449 Chronic obstructive pulmonary disease, unspecified: Secondary | ICD-10-CM | POA: Diagnosis not present

## 2020-09-05 DIAGNOSIS — E1142 Type 2 diabetes mellitus with diabetic polyneuropathy: Secondary | ICD-10-CM | POA: Diagnosis not present

## 2020-09-05 DIAGNOSIS — F17210 Nicotine dependence, cigarettes, uncomplicated: Secondary | ICD-10-CM | POA: Diagnosis not present

## 2020-09-05 DIAGNOSIS — J9621 Acute and chronic respiratory failure with hypoxia: Secondary | ICD-10-CM | POA: Diagnosis not present

## 2020-09-05 DIAGNOSIS — F32A Depression, unspecified: Secondary | ICD-10-CM | POA: Diagnosis not present

## 2020-09-08 DIAGNOSIS — F17210 Nicotine dependence, cigarettes, uncomplicated: Secondary | ICD-10-CM | POA: Diagnosis not present

## 2020-09-08 DIAGNOSIS — F32A Depression, unspecified: Secondary | ICD-10-CM | POA: Diagnosis not present

## 2020-09-08 DIAGNOSIS — J9621 Acute and chronic respiratory failure with hypoxia: Secondary | ICD-10-CM | POA: Diagnosis not present

## 2020-09-08 DIAGNOSIS — E1142 Type 2 diabetes mellitus with diabetic polyneuropathy: Secondary | ICD-10-CM | POA: Diagnosis not present

## 2020-09-08 DIAGNOSIS — J449 Chronic obstructive pulmonary disease, unspecified: Secondary | ICD-10-CM | POA: Diagnosis not present

## 2020-09-09 DIAGNOSIS — G629 Polyneuropathy, unspecified: Secondary | ICD-10-CM | POA: Diagnosis not present

## 2020-09-09 DIAGNOSIS — J449 Chronic obstructive pulmonary disease, unspecified: Secondary | ICD-10-CM | POA: Diagnosis not present

## 2020-09-09 DIAGNOSIS — E1142 Type 2 diabetes mellitus with diabetic polyneuropathy: Secondary | ICD-10-CM | POA: Diagnosis not present

## 2020-09-09 DIAGNOSIS — F251 Schizoaffective disorder, depressive type: Secondary | ICD-10-CM | POA: Diagnosis not present

## 2020-09-09 DIAGNOSIS — K5900 Constipation, unspecified: Secondary | ICD-10-CM | POA: Diagnosis not present

## 2020-09-09 DIAGNOSIS — I10 Essential (primary) hypertension: Secondary | ICD-10-CM | POA: Diagnosis not present

## 2020-09-09 DIAGNOSIS — E782 Mixed hyperlipidemia: Secondary | ICD-10-CM | POA: Diagnosis not present

## 2020-09-09 DIAGNOSIS — Z794 Long term (current) use of insulin: Secondary | ICD-10-CM | POA: Diagnosis not present

## 2020-09-10 DIAGNOSIS — J9621 Acute and chronic respiratory failure with hypoxia: Secondary | ICD-10-CM | POA: Diagnosis not present

## 2020-09-10 DIAGNOSIS — F32A Depression, unspecified: Secondary | ICD-10-CM | POA: Diagnosis not present

## 2020-09-10 DIAGNOSIS — J449 Chronic obstructive pulmonary disease, unspecified: Secondary | ICD-10-CM | POA: Diagnosis not present

## 2020-09-10 DIAGNOSIS — E1142 Type 2 diabetes mellitus with diabetic polyneuropathy: Secondary | ICD-10-CM | POA: Diagnosis not present

## 2020-09-10 DIAGNOSIS — F17210 Nicotine dependence, cigarettes, uncomplicated: Secondary | ICD-10-CM | POA: Diagnosis not present

## 2020-09-11 DIAGNOSIS — F17210 Nicotine dependence, cigarettes, uncomplicated: Secondary | ICD-10-CM | POA: Diagnosis not present

## 2020-09-11 DIAGNOSIS — J449 Chronic obstructive pulmonary disease, unspecified: Secondary | ICD-10-CM | POA: Diagnosis not present

## 2020-09-11 DIAGNOSIS — E1142 Type 2 diabetes mellitus with diabetic polyneuropathy: Secondary | ICD-10-CM | POA: Diagnosis not present

## 2020-09-11 DIAGNOSIS — F32A Depression, unspecified: Secondary | ICD-10-CM | POA: Diagnosis not present

## 2020-09-11 DIAGNOSIS — J9621 Acute and chronic respiratory failure with hypoxia: Secondary | ICD-10-CM | POA: Diagnosis not present

## 2020-09-12 DIAGNOSIS — J9621 Acute and chronic respiratory failure with hypoxia: Secondary | ICD-10-CM | POA: Diagnosis not present

## 2020-09-12 DIAGNOSIS — F17210 Nicotine dependence, cigarettes, uncomplicated: Secondary | ICD-10-CM | POA: Diagnosis not present

## 2020-09-12 DIAGNOSIS — J449 Chronic obstructive pulmonary disease, unspecified: Secondary | ICD-10-CM | POA: Diagnosis not present

## 2020-09-12 DIAGNOSIS — F32A Depression, unspecified: Secondary | ICD-10-CM | POA: Diagnosis not present

## 2020-09-12 DIAGNOSIS — E1142 Type 2 diabetes mellitus with diabetic polyneuropathy: Secondary | ICD-10-CM | POA: Diagnosis not present

## 2020-09-15 DIAGNOSIS — J9621 Acute and chronic respiratory failure with hypoxia: Secondary | ICD-10-CM | POA: Diagnosis not present

## 2020-09-15 DIAGNOSIS — Z20822 Contact with and (suspected) exposure to covid-19: Secondary | ICD-10-CM | POA: Diagnosis not present

## 2020-09-15 DIAGNOSIS — J449 Chronic obstructive pulmonary disease, unspecified: Secondary | ICD-10-CM | POA: Diagnosis not present

## 2020-09-15 DIAGNOSIS — F17210 Nicotine dependence, cigarettes, uncomplicated: Secondary | ICD-10-CM | POA: Diagnosis not present

## 2020-09-15 DIAGNOSIS — F32A Depression, unspecified: Secondary | ICD-10-CM | POA: Diagnosis not present

## 2020-09-15 DIAGNOSIS — E1142 Type 2 diabetes mellitus with diabetic polyneuropathy: Secondary | ICD-10-CM | POA: Diagnosis not present

## 2020-09-17 DIAGNOSIS — J449 Chronic obstructive pulmonary disease, unspecified: Secondary | ICD-10-CM | POA: Diagnosis not present

## 2020-09-17 DIAGNOSIS — E1142 Type 2 diabetes mellitus with diabetic polyneuropathy: Secondary | ICD-10-CM | POA: Diagnosis not present

## 2020-09-17 DIAGNOSIS — F32A Depression, unspecified: Secondary | ICD-10-CM | POA: Diagnosis not present

## 2020-09-17 DIAGNOSIS — J9621 Acute and chronic respiratory failure with hypoxia: Secondary | ICD-10-CM | POA: Diagnosis not present

## 2020-09-17 DIAGNOSIS — F17210 Nicotine dependence, cigarettes, uncomplicated: Secondary | ICD-10-CM | POA: Diagnosis not present

## 2020-09-18 DIAGNOSIS — F17210 Nicotine dependence, cigarettes, uncomplicated: Secondary | ICD-10-CM | POA: Diagnosis not present

## 2020-09-18 DIAGNOSIS — E1142 Type 2 diabetes mellitus with diabetic polyneuropathy: Secondary | ICD-10-CM | POA: Diagnosis not present

## 2020-09-18 DIAGNOSIS — J449 Chronic obstructive pulmonary disease, unspecified: Secondary | ICD-10-CM | POA: Diagnosis not present

## 2020-09-18 DIAGNOSIS — F32A Depression, unspecified: Secondary | ICD-10-CM | POA: Diagnosis not present

## 2020-09-18 DIAGNOSIS — J9621 Acute and chronic respiratory failure with hypoxia: Secondary | ICD-10-CM | POA: Diagnosis not present

## 2020-09-19 DIAGNOSIS — J449 Chronic obstructive pulmonary disease, unspecified: Secondary | ICD-10-CM | POA: Diagnosis not present

## 2020-09-19 DIAGNOSIS — J9621 Acute and chronic respiratory failure with hypoxia: Secondary | ICD-10-CM | POA: Diagnosis not present

## 2020-09-19 DIAGNOSIS — F32A Depression, unspecified: Secondary | ICD-10-CM | POA: Diagnosis not present

## 2020-09-19 DIAGNOSIS — E1142 Type 2 diabetes mellitus with diabetic polyneuropathy: Secondary | ICD-10-CM | POA: Diagnosis not present

## 2020-09-19 DIAGNOSIS — F17210 Nicotine dependence, cigarettes, uncomplicated: Secondary | ICD-10-CM | POA: Diagnosis not present

## 2020-09-22 DIAGNOSIS — F32A Depression, unspecified: Secondary | ICD-10-CM | POA: Diagnosis not present

## 2020-09-22 DIAGNOSIS — J449 Chronic obstructive pulmonary disease, unspecified: Secondary | ICD-10-CM | POA: Diagnosis not present

## 2020-09-22 DIAGNOSIS — J9621 Acute and chronic respiratory failure with hypoxia: Secondary | ICD-10-CM | POA: Diagnosis not present

## 2020-09-22 DIAGNOSIS — F17210 Nicotine dependence, cigarettes, uncomplicated: Secondary | ICD-10-CM | POA: Diagnosis not present

## 2020-09-22 DIAGNOSIS — E1142 Type 2 diabetes mellitus with diabetic polyneuropathy: Secondary | ICD-10-CM | POA: Diagnosis not present

## 2020-09-24 DIAGNOSIS — E1142 Type 2 diabetes mellitus with diabetic polyneuropathy: Secondary | ICD-10-CM | POA: Diagnosis not present

## 2020-09-24 DIAGNOSIS — F17210 Nicotine dependence, cigarettes, uncomplicated: Secondary | ICD-10-CM | POA: Diagnosis not present

## 2020-09-24 DIAGNOSIS — J9621 Acute and chronic respiratory failure with hypoxia: Secondary | ICD-10-CM | POA: Diagnosis not present

## 2020-09-24 DIAGNOSIS — J449 Chronic obstructive pulmonary disease, unspecified: Secondary | ICD-10-CM | POA: Diagnosis not present

## 2020-09-24 DIAGNOSIS — F32A Depression, unspecified: Secondary | ICD-10-CM | POA: Diagnosis not present

## 2020-09-25 DIAGNOSIS — E1142 Type 2 diabetes mellitus with diabetic polyneuropathy: Secondary | ICD-10-CM | POA: Diagnosis not present

## 2020-09-25 DIAGNOSIS — R32 Unspecified urinary incontinence: Secondary | ICD-10-CM | POA: Diagnosis not present

## 2020-09-25 DIAGNOSIS — F259 Schizoaffective disorder, unspecified: Secondary | ICD-10-CM | POA: Diagnosis not present

## 2020-09-25 DIAGNOSIS — H409 Unspecified glaucoma: Secondary | ICD-10-CM | POA: Diagnosis not present

## 2020-09-25 DIAGNOSIS — Z794 Long term (current) use of insulin: Secondary | ICD-10-CM | POA: Diagnosis not present

## 2020-09-25 DIAGNOSIS — F32A Depression, unspecified: Secondary | ICD-10-CM | POA: Diagnosis not present

## 2020-09-25 DIAGNOSIS — Z741 Need for assistance with personal care: Secondary | ICD-10-CM | POA: Diagnosis not present

## 2020-09-25 DIAGNOSIS — K5900 Constipation, unspecified: Secondary | ICD-10-CM | POA: Diagnosis not present

## 2020-09-25 DIAGNOSIS — I10 Essential (primary) hypertension: Secondary | ICD-10-CM | POA: Diagnosis not present

## 2020-09-25 DIAGNOSIS — R159 Full incontinence of feces: Secondary | ICD-10-CM | POA: Diagnosis not present

## 2020-09-25 DIAGNOSIS — Z6841 Body Mass Index (BMI) 40.0 and over, adult: Secondary | ICD-10-CM | POA: Diagnosis not present

## 2020-09-25 DIAGNOSIS — F17210 Nicotine dependence, cigarettes, uncomplicated: Secondary | ICD-10-CM | POA: Diagnosis not present

## 2020-09-25 DIAGNOSIS — J449 Chronic obstructive pulmonary disease, unspecified: Secondary | ICD-10-CM | POA: Diagnosis not present

## 2020-09-25 DIAGNOSIS — S4291XS Fracture of right shoulder girdle, part unspecified, sequela: Secondary | ICD-10-CM | POA: Diagnosis not present

## 2020-09-25 DIAGNOSIS — J9621 Acute and chronic respiratory failure with hypoxia: Secondary | ICD-10-CM | POA: Diagnosis not present

## 2020-09-25 DIAGNOSIS — Z9981 Dependence on supplemental oxygen: Secondary | ICD-10-CM | POA: Diagnosis not present

## 2020-09-25 DIAGNOSIS — R251 Tremor, unspecified: Secondary | ICD-10-CM | POA: Diagnosis not present

## 2020-09-26 DIAGNOSIS — F32A Depression, unspecified: Secondary | ICD-10-CM | POA: Diagnosis not present

## 2020-09-26 DIAGNOSIS — E1142 Type 2 diabetes mellitus with diabetic polyneuropathy: Secondary | ICD-10-CM | POA: Diagnosis not present

## 2020-09-26 DIAGNOSIS — J9621 Acute and chronic respiratory failure with hypoxia: Secondary | ICD-10-CM | POA: Diagnosis not present

## 2020-09-26 DIAGNOSIS — J449 Chronic obstructive pulmonary disease, unspecified: Secondary | ICD-10-CM | POA: Diagnosis not present

## 2020-09-26 DIAGNOSIS — F17210 Nicotine dependence, cigarettes, uncomplicated: Secondary | ICD-10-CM | POA: Diagnosis not present

## 2020-09-29 DIAGNOSIS — F32A Depression, unspecified: Secondary | ICD-10-CM | POA: Diagnosis not present

## 2020-09-29 DIAGNOSIS — F17210 Nicotine dependence, cigarettes, uncomplicated: Secondary | ICD-10-CM | POA: Diagnosis not present

## 2020-09-29 DIAGNOSIS — J9621 Acute and chronic respiratory failure with hypoxia: Secondary | ICD-10-CM | POA: Diagnosis not present

## 2020-09-29 DIAGNOSIS — J449 Chronic obstructive pulmonary disease, unspecified: Secondary | ICD-10-CM | POA: Diagnosis not present

## 2020-09-29 DIAGNOSIS — E1142 Type 2 diabetes mellitus with diabetic polyneuropathy: Secondary | ICD-10-CM | POA: Diagnosis not present

## 2020-09-30 DIAGNOSIS — J449 Chronic obstructive pulmonary disease, unspecified: Secondary | ICD-10-CM | POA: Diagnosis not present

## 2020-09-30 DIAGNOSIS — E1142 Type 2 diabetes mellitus with diabetic polyneuropathy: Secondary | ICD-10-CM | POA: Diagnosis not present

## 2020-09-30 DIAGNOSIS — F17210 Nicotine dependence, cigarettes, uncomplicated: Secondary | ICD-10-CM | POA: Diagnosis not present

## 2020-09-30 DIAGNOSIS — J9621 Acute and chronic respiratory failure with hypoxia: Secondary | ICD-10-CM | POA: Diagnosis not present

## 2020-09-30 DIAGNOSIS — F32A Depression, unspecified: Secondary | ICD-10-CM | POA: Diagnosis not present

## 2020-10-01 DIAGNOSIS — F32A Depression, unspecified: Secondary | ICD-10-CM | POA: Diagnosis not present

## 2020-10-01 DIAGNOSIS — J9621 Acute and chronic respiratory failure with hypoxia: Secondary | ICD-10-CM | POA: Diagnosis not present

## 2020-10-01 DIAGNOSIS — E1142 Type 2 diabetes mellitus with diabetic polyneuropathy: Secondary | ICD-10-CM | POA: Diagnosis not present

## 2020-10-01 DIAGNOSIS — F17210 Nicotine dependence, cigarettes, uncomplicated: Secondary | ICD-10-CM | POA: Diagnosis not present

## 2020-10-01 DIAGNOSIS — J449 Chronic obstructive pulmonary disease, unspecified: Secondary | ICD-10-CM | POA: Diagnosis not present

## 2020-10-02 DIAGNOSIS — J9621 Acute and chronic respiratory failure with hypoxia: Secondary | ICD-10-CM | POA: Diagnosis not present

## 2020-10-02 DIAGNOSIS — E1142 Type 2 diabetes mellitus with diabetic polyneuropathy: Secondary | ICD-10-CM | POA: Diagnosis not present

## 2020-10-02 DIAGNOSIS — J449 Chronic obstructive pulmonary disease, unspecified: Secondary | ICD-10-CM | POA: Diagnosis not present

## 2020-10-02 DIAGNOSIS — F17210 Nicotine dependence, cigarettes, uncomplicated: Secondary | ICD-10-CM | POA: Diagnosis not present

## 2020-10-02 DIAGNOSIS — F32A Depression, unspecified: Secondary | ICD-10-CM | POA: Diagnosis not present

## 2020-10-03 DIAGNOSIS — E1142 Type 2 diabetes mellitus with diabetic polyneuropathy: Secondary | ICD-10-CM | POA: Diagnosis not present

## 2020-10-03 DIAGNOSIS — E162 Hypoglycemia, unspecified: Secondary | ICD-10-CM | POA: Diagnosis not present

## 2020-10-03 DIAGNOSIS — I959 Hypotension, unspecified: Secondary | ICD-10-CM | POA: Diagnosis not present

## 2020-10-03 DIAGNOSIS — R4020 Unspecified coma: Secondary | ICD-10-CM | POA: Diagnosis not present

## 2020-10-03 DIAGNOSIS — F17210 Nicotine dependence, cigarettes, uncomplicated: Secondary | ICD-10-CM | POA: Diagnosis not present

## 2020-10-03 DIAGNOSIS — J9621 Acute and chronic respiratory failure with hypoxia: Secondary | ICD-10-CM | POA: Diagnosis not present

## 2020-10-03 DIAGNOSIS — E161 Other hypoglycemia: Secondary | ICD-10-CM | POA: Diagnosis not present

## 2020-10-03 DIAGNOSIS — J449 Chronic obstructive pulmonary disease, unspecified: Secondary | ICD-10-CM | POA: Diagnosis not present

## 2020-10-03 DIAGNOSIS — F32A Depression, unspecified: Secondary | ICD-10-CM | POA: Diagnosis not present

## 2020-10-03 DIAGNOSIS — R404 Transient alteration of awareness: Secondary | ICD-10-CM | POA: Diagnosis not present

## 2020-10-04 DIAGNOSIS — F32A Depression, unspecified: Secondary | ICD-10-CM | POA: Diagnosis not present

## 2020-10-04 DIAGNOSIS — J449 Chronic obstructive pulmonary disease, unspecified: Secondary | ICD-10-CM | POA: Diagnosis not present

## 2020-10-04 DIAGNOSIS — E1142 Type 2 diabetes mellitus with diabetic polyneuropathy: Secondary | ICD-10-CM | POA: Diagnosis not present

## 2020-10-04 DIAGNOSIS — F17210 Nicotine dependence, cigarettes, uncomplicated: Secondary | ICD-10-CM | POA: Diagnosis not present

## 2020-10-04 DIAGNOSIS — J9621 Acute and chronic respiratory failure with hypoxia: Secondary | ICD-10-CM | POA: Diagnosis not present

## 2020-10-06 DIAGNOSIS — E1142 Type 2 diabetes mellitus with diabetic polyneuropathy: Secondary | ICD-10-CM | POA: Diagnosis not present

## 2020-10-06 DIAGNOSIS — F17210 Nicotine dependence, cigarettes, uncomplicated: Secondary | ICD-10-CM | POA: Diagnosis not present

## 2020-10-06 DIAGNOSIS — J449 Chronic obstructive pulmonary disease, unspecified: Secondary | ICD-10-CM | POA: Diagnosis not present

## 2020-10-06 DIAGNOSIS — F32A Depression, unspecified: Secondary | ICD-10-CM | POA: Diagnosis not present

## 2020-10-06 DIAGNOSIS — J9621 Acute and chronic respiratory failure with hypoxia: Secondary | ICD-10-CM | POA: Diagnosis not present

## 2020-10-08 DIAGNOSIS — F32A Depression, unspecified: Secondary | ICD-10-CM | POA: Diagnosis not present

## 2020-10-08 DIAGNOSIS — J9621 Acute and chronic respiratory failure with hypoxia: Secondary | ICD-10-CM | POA: Diagnosis not present

## 2020-10-08 DIAGNOSIS — F17210 Nicotine dependence, cigarettes, uncomplicated: Secondary | ICD-10-CM | POA: Diagnosis not present

## 2020-10-08 DIAGNOSIS — E1142 Type 2 diabetes mellitus with diabetic polyneuropathy: Secondary | ICD-10-CM | POA: Diagnosis not present

## 2020-10-08 DIAGNOSIS — J449 Chronic obstructive pulmonary disease, unspecified: Secondary | ICD-10-CM | POA: Diagnosis not present

## 2020-10-09 DIAGNOSIS — J9621 Acute and chronic respiratory failure with hypoxia: Secondary | ICD-10-CM | POA: Diagnosis not present

## 2020-10-09 DIAGNOSIS — F17210 Nicotine dependence, cigarettes, uncomplicated: Secondary | ICD-10-CM | POA: Diagnosis not present

## 2020-10-09 DIAGNOSIS — E1142 Type 2 diabetes mellitus with diabetic polyneuropathy: Secondary | ICD-10-CM | POA: Diagnosis not present

## 2020-10-09 DIAGNOSIS — J449 Chronic obstructive pulmonary disease, unspecified: Secondary | ICD-10-CM | POA: Diagnosis not present

## 2020-10-09 DIAGNOSIS — F32A Depression, unspecified: Secondary | ICD-10-CM | POA: Diagnosis not present

## 2020-10-10 DIAGNOSIS — J449 Chronic obstructive pulmonary disease, unspecified: Secondary | ICD-10-CM | POA: Diagnosis not present

## 2020-10-10 DIAGNOSIS — E1142 Type 2 diabetes mellitus with diabetic polyneuropathy: Secondary | ICD-10-CM | POA: Diagnosis not present

## 2020-10-10 DIAGNOSIS — J9621 Acute and chronic respiratory failure with hypoxia: Secondary | ICD-10-CM | POA: Diagnosis not present

## 2020-10-10 DIAGNOSIS — F17210 Nicotine dependence, cigarettes, uncomplicated: Secondary | ICD-10-CM | POA: Diagnosis not present

## 2020-10-10 DIAGNOSIS — F32A Depression, unspecified: Secondary | ICD-10-CM | POA: Diagnosis not present

## 2020-10-13 DIAGNOSIS — F17210 Nicotine dependence, cigarettes, uncomplicated: Secondary | ICD-10-CM | POA: Diagnosis not present

## 2020-10-13 DIAGNOSIS — J9621 Acute and chronic respiratory failure with hypoxia: Secondary | ICD-10-CM | POA: Diagnosis not present

## 2020-10-13 DIAGNOSIS — E1142 Type 2 diabetes mellitus with diabetic polyneuropathy: Secondary | ICD-10-CM | POA: Diagnosis not present

## 2020-10-13 DIAGNOSIS — J449 Chronic obstructive pulmonary disease, unspecified: Secondary | ICD-10-CM | POA: Diagnosis not present

## 2020-10-13 DIAGNOSIS — F32A Depression, unspecified: Secondary | ICD-10-CM | POA: Diagnosis not present

## 2020-10-15 DIAGNOSIS — J449 Chronic obstructive pulmonary disease, unspecified: Secondary | ICD-10-CM | POA: Diagnosis not present

## 2020-10-15 DIAGNOSIS — J9621 Acute and chronic respiratory failure with hypoxia: Secondary | ICD-10-CM | POA: Diagnosis not present

## 2020-10-15 DIAGNOSIS — E1142 Type 2 diabetes mellitus with diabetic polyneuropathy: Secondary | ICD-10-CM | POA: Diagnosis not present

## 2020-10-15 DIAGNOSIS — F32A Depression, unspecified: Secondary | ICD-10-CM | POA: Diagnosis not present

## 2020-10-15 DIAGNOSIS — F17210 Nicotine dependence, cigarettes, uncomplicated: Secondary | ICD-10-CM | POA: Diagnosis not present

## 2020-10-16 DIAGNOSIS — E1142 Type 2 diabetes mellitus with diabetic polyneuropathy: Secondary | ICD-10-CM | POA: Diagnosis not present

## 2020-10-16 DIAGNOSIS — F17210 Nicotine dependence, cigarettes, uncomplicated: Secondary | ICD-10-CM | POA: Diagnosis not present

## 2020-10-16 DIAGNOSIS — J9621 Acute and chronic respiratory failure with hypoxia: Secondary | ICD-10-CM | POA: Diagnosis not present

## 2020-10-16 DIAGNOSIS — F32A Depression, unspecified: Secondary | ICD-10-CM | POA: Diagnosis not present

## 2020-10-16 DIAGNOSIS — J449 Chronic obstructive pulmonary disease, unspecified: Secondary | ICD-10-CM | POA: Diagnosis not present

## 2020-10-17 DIAGNOSIS — J9621 Acute and chronic respiratory failure with hypoxia: Secondary | ICD-10-CM | POA: Diagnosis not present

## 2020-10-17 DIAGNOSIS — F32A Depression, unspecified: Secondary | ICD-10-CM | POA: Diagnosis not present

## 2020-10-17 DIAGNOSIS — E1142 Type 2 diabetes mellitus with diabetic polyneuropathy: Secondary | ICD-10-CM | POA: Diagnosis not present

## 2020-10-17 DIAGNOSIS — J449 Chronic obstructive pulmonary disease, unspecified: Secondary | ICD-10-CM | POA: Diagnosis not present

## 2020-10-17 DIAGNOSIS — F17210 Nicotine dependence, cigarettes, uncomplicated: Secondary | ICD-10-CM | POA: Diagnosis not present

## 2020-10-20 DIAGNOSIS — J9621 Acute and chronic respiratory failure with hypoxia: Secondary | ICD-10-CM | POA: Diagnosis not present

## 2020-10-20 DIAGNOSIS — F17210 Nicotine dependence, cigarettes, uncomplicated: Secondary | ICD-10-CM | POA: Diagnosis not present

## 2020-10-20 DIAGNOSIS — J449 Chronic obstructive pulmonary disease, unspecified: Secondary | ICD-10-CM | POA: Diagnosis not present

## 2020-10-20 DIAGNOSIS — E1142 Type 2 diabetes mellitus with diabetic polyneuropathy: Secondary | ICD-10-CM | POA: Diagnosis not present

## 2020-10-20 DIAGNOSIS — F32A Depression, unspecified: Secondary | ICD-10-CM | POA: Diagnosis not present

## 2020-10-22 DIAGNOSIS — J9621 Acute and chronic respiratory failure with hypoxia: Secondary | ICD-10-CM | POA: Diagnosis not present

## 2020-10-22 DIAGNOSIS — F32A Depression, unspecified: Secondary | ICD-10-CM | POA: Diagnosis not present

## 2020-10-22 DIAGNOSIS — E1142 Type 2 diabetes mellitus with diabetic polyneuropathy: Secondary | ICD-10-CM | POA: Diagnosis not present

## 2020-10-22 DIAGNOSIS — F17210 Nicotine dependence, cigarettes, uncomplicated: Secondary | ICD-10-CM | POA: Diagnosis not present

## 2020-10-22 DIAGNOSIS — J449 Chronic obstructive pulmonary disease, unspecified: Secondary | ICD-10-CM | POA: Diagnosis not present

## 2020-10-23 DIAGNOSIS — F17210 Nicotine dependence, cigarettes, uncomplicated: Secondary | ICD-10-CM | POA: Diagnosis not present

## 2020-10-23 DIAGNOSIS — F32A Depression, unspecified: Secondary | ICD-10-CM | POA: Diagnosis not present

## 2020-10-23 DIAGNOSIS — J9621 Acute and chronic respiratory failure with hypoxia: Secondary | ICD-10-CM | POA: Diagnosis not present

## 2020-10-23 DIAGNOSIS — J449 Chronic obstructive pulmonary disease, unspecified: Secondary | ICD-10-CM | POA: Diagnosis not present

## 2020-10-23 DIAGNOSIS — E1142 Type 2 diabetes mellitus with diabetic polyneuropathy: Secondary | ICD-10-CM | POA: Diagnosis not present

## 2020-10-24 DIAGNOSIS — J449 Chronic obstructive pulmonary disease, unspecified: Secondary | ICD-10-CM | POA: Diagnosis not present

## 2020-10-24 DIAGNOSIS — J9621 Acute and chronic respiratory failure with hypoxia: Secondary | ICD-10-CM | POA: Diagnosis not present

## 2020-10-24 DIAGNOSIS — E1142 Type 2 diabetes mellitus with diabetic polyneuropathy: Secondary | ICD-10-CM | POA: Diagnosis not present

## 2020-10-24 DIAGNOSIS — F32A Depression, unspecified: Secondary | ICD-10-CM | POA: Diagnosis not present

## 2020-10-24 DIAGNOSIS — F17210 Nicotine dependence, cigarettes, uncomplicated: Secondary | ICD-10-CM | POA: Diagnosis not present

## 2020-10-25 DIAGNOSIS — K5900 Constipation, unspecified: Secondary | ICD-10-CM | POA: Diagnosis not present

## 2020-10-25 DIAGNOSIS — Z794 Long term (current) use of insulin: Secondary | ICD-10-CM | POA: Diagnosis not present

## 2020-10-25 DIAGNOSIS — Z9981 Dependence on supplemental oxygen: Secondary | ICD-10-CM | POA: Diagnosis not present

## 2020-10-25 DIAGNOSIS — S4291XS Fracture of right shoulder girdle, part unspecified, sequela: Secondary | ICD-10-CM | POA: Diagnosis not present

## 2020-10-25 DIAGNOSIS — E1142 Type 2 diabetes mellitus with diabetic polyneuropathy: Secondary | ICD-10-CM | POA: Diagnosis not present

## 2020-10-25 DIAGNOSIS — I10 Essential (primary) hypertension: Secondary | ICD-10-CM | POA: Diagnosis not present

## 2020-10-25 DIAGNOSIS — J449 Chronic obstructive pulmonary disease, unspecified: Secondary | ICD-10-CM | POA: Diagnosis not present

## 2020-10-25 DIAGNOSIS — Z6841 Body Mass Index (BMI) 40.0 and over, adult: Secondary | ICD-10-CM | POA: Diagnosis not present

## 2020-10-25 DIAGNOSIS — F259 Schizoaffective disorder, unspecified: Secondary | ICD-10-CM | POA: Diagnosis not present

## 2020-10-25 DIAGNOSIS — F17210 Nicotine dependence, cigarettes, uncomplicated: Secondary | ICD-10-CM | POA: Diagnosis not present

## 2020-10-25 DIAGNOSIS — R251 Tremor, unspecified: Secondary | ICD-10-CM | POA: Diagnosis not present

## 2020-10-25 DIAGNOSIS — R32 Unspecified urinary incontinence: Secondary | ICD-10-CM | POA: Diagnosis not present

## 2020-10-25 DIAGNOSIS — R159 Full incontinence of feces: Secondary | ICD-10-CM | POA: Diagnosis not present

## 2020-10-25 DIAGNOSIS — J9621 Acute and chronic respiratory failure with hypoxia: Secondary | ICD-10-CM | POA: Diagnosis not present

## 2020-10-25 DIAGNOSIS — Z741 Need for assistance with personal care: Secondary | ICD-10-CM | POA: Diagnosis not present

## 2020-10-25 DIAGNOSIS — H409 Unspecified glaucoma: Secondary | ICD-10-CM | POA: Diagnosis not present

## 2020-10-25 DIAGNOSIS — F32A Depression, unspecified: Secondary | ICD-10-CM | POA: Diagnosis not present

## 2020-10-27 DIAGNOSIS — J449 Chronic obstructive pulmonary disease, unspecified: Secondary | ICD-10-CM | POA: Diagnosis not present

## 2020-10-27 DIAGNOSIS — J9621 Acute and chronic respiratory failure with hypoxia: Secondary | ICD-10-CM | POA: Diagnosis not present

## 2020-10-27 DIAGNOSIS — F17210 Nicotine dependence, cigarettes, uncomplicated: Secondary | ICD-10-CM | POA: Diagnosis not present

## 2020-10-27 DIAGNOSIS — E1142 Type 2 diabetes mellitus with diabetic polyneuropathy: Secondary | ICD-10-CM | POA: Diagnosis not present

## 2020-10-27 DIAGNOSIS — F32A Depression, unspecified: Secondary | ICD-10-CM | POA: Diagnosis not present

## 2020-10-29 DIAGNOSIS — F17210 Nicotine dependence, cigarettes, uncomplicated: Secondary | ICD-10-CM | POA: Diagnosis not present

## 2020-10-29 DIAGNOSIS — J9621 Acute and chronic respiratory failure with hypoxia: Secondary | ICD-10-CM | POA: Diagnosis not present

## 2020-10-29 DIAGNOSIS — J449 Chronic obstructive pulmonary disease, unspecified: Secondary | ICD-10-CM | POA: Diagnosis not present

## 2020-10-29 DIAGNOSIS — F32A Depression, unspecified: Secondary | ICD-10-CM | POA: Diagnosis not present

## 2020-10-29 DIAGNOSIS — E1142 Type 2 diabetes mellitus with diabetic polyneuropathy: Secondary | ICD-10-CM | POA: Diagnosis not present

## 2020-10-30 DIAGNOSIS — F32A Depression, unspecified: Secondary | ICD-10-CM | POA: Diagnosis not present

## 2020-10-30 DIAGNOSIS — J9621 Acute and chronic respiratory failure with hypoxia: Secondary | ICD-10-CM | POA: Diagnosis not present

## 2020-10-30 DIAGNOSIS — F17210 Nicotine dependence, cigarettes, uncomplicated: Secondary | ICD-10-CM | POA: Diagnosis not present

## 2020-10-30 DIAGNOSIS — J449 Chronic obstructive pulmonary disease, unspecified: Secondary | ICD-10-CM | POA: Diagnosis not present

## 2020-10-30 DIAGNOSIS — E1142 Type 2 diabetes mellitus with diabetic polyneuropathy: Secondary | ICD-10-CM | POA: Diagnosis not present

## 2020-10-31 DIAGNOSIS — F32A Depression, unspecified: Secondary | ICD-10-CM | POA: Diagnosis not present

## 2020-10-31 DIAGNOSIS — J449 Chronic obstructive pulmonary disease, unspecified: Secondary | ICD-10-CM | POA: Diagnosis not present

## 2020-10-31 DIAGNOSIS — E1142 Type 2 diabetes mellitus with diabetic polyneuropathy: Secondary | ICD-10-CM | POA: Diagnosis not present

## 2020-10-31 DIAGNOSIS — J9621 Acute and chronic respiratory failure with hypoxia: Secondary | ICD-10-CM | POA: Diagnosis not present

## 2020-10-31 DIAGNOSIS — F17210 Nicotine dependence, cigarettes, uncomplicated: Secondary | ICD-10-CM | POA: Diagnosis not present

## 2020-11-03 DIAGNOSIS — F17210 Nicotine dependence, cigarettes, uncomplicated: Secondary | ICD-10-CM | POA: Diagnosis not present

## 2020-11-03 DIAGNOSIS — J449 Chronic obstructive pulmonary disease, unspecified: Secondary | ICD-10-CM | POA: Diagnosis not present

## 2020-11-03 DIAGNOSIS — E1142 Type 2 diabetes mellitus with diabetic polyneuropathy: Secondary | ICD-10-CM | POA: Diagnosis not present

## 2020-11-03 DIAGNOSIS — F32A Depression, unspecified: Secondary | ICD-10-CM | POA: Diagnosis not present

## 2020-11-03 DIAGNOSIS — J9621 Acute and chronic respiratory failure with hypoxia: Secondary | ICD-10-CM | POA: Diagnosis not present

## 2020-11-05 DIAGNOSIS — F17210 Nicotine dependence, cigarettes, uncomplicated: Secondary | ICD-10-CM | POA: Diagnosis not present

## 2020-11-05 DIAGNOSIS — F32A Depression, unspecified: Secondary | ICD-10-CM | POA: Diagnosis not present

## 2020-11-05 DIAGNOSIS — J449 Chronic obstructive pulmonary disease, unspecified: Secondary | ICD-10-CM | POA: Diagnosis not present

## 2020-11-05 DIAGNOSIS — E1142 Type 2 diabetes mellitus with diabetic polyneuropathy: Secondary | ICD-10-CM | POA: Diagnosis not present

## 2020-11-05 DIAGNOSIS — J9621 Acute and chronic respiratory failure with hypoxia: Secondary | ICD-10-CM | POA: Diagnosis not present

## 2020-11-06 DIAGNOSIS — F17210 Nicotine dependence, cigarettes, uncomplicated: Secondary | ICD-10-CM | POA: Diagnosis not present

## 2020-11-06 DIAGNOSIS — E1142 Type 2 diabetes mellitus with diabetic polyneuropathy: Secondary | ICD-10-CM | POA: Diagnosis not present

## 2020-11-06 DIAGNOSIS — F32A Depression, unspecified: Secondary | ICD-10-CM | POA: Diagnosis not present

## 2020-11-06 DIAGNOSIS — J9621 Acute and chronic respiratory failure with hypoxia: Secondary | ICD-10-CM | POA: Diagnosis not present

## 2020-11-06 DIAGNOSIS — J449 Chronic obstructive pulmonary disease, unspecified: Secondary | ICD-10-CM | POA: Diagnosis not present

## 2020-11-07 DIAGNOSIS — J449 Chronic obstructive pulmonary disease, unspecified: Secondary | ICD-10-CM | POA: Diagnosis not present

## 2020-11-07 DIAGNOSIS — E1142 Type 2 diabetes mellitus with diabetic polyneuropathy: Secondary | ICD-10-CM | POA: Diagnosis not present

## 2020-11-07 DIAGNOSIS — F32A Depression, unspecified: Secondary | ICD-10-CM | POA: Diagnosis not present

## 2020-11-07 DIAGNOSIS — F17210 Nicotine dependence, cigarettes, uncomplicated: Secondary | ICD-10-CM | POA: Diagnosis not present

## 2020-11-07 DIAGNOSIS — J9621 Acute and chronic respiratory failure with hypoxia: Secondary | ICD-10-CM | POA: Diagnosis not present

## 2020-11-10 DIAGNOSIS — E1142 Type 2 diabetes mellitus with diabetic polyneuropathy: Secondary | ICD-10-CM | POA: Diagnosis not present

## 2020-11-10 DIAGNOSIS — J449 Chronic obstructive pulmonary disease, unspecified: Secondary | ICD-10-CM | POA: Diagnosis not present

## 2020-11-10 DIAGNOSIS — F17210 Nicotine dependence, cigarettes, uncomplicated: Secondary | ICD-10-CM | POA: Diagnosis not present

## 2020-11-10 DIAGNOSIS — J9621 Acute and chronic respiratory failure with hypoxia: Secondary | ICD-10-CM | POA: Diagnosis not present

## 2020-11-10 DIAGNOSIS — F32A Depression, unspecified: Secondary | ICD-10-CM | POA: Diagnosis not present

## 2020-11-12 DIAGNOSIS — J9621 Acute and chronic respiratory failure with hypoxia: Secondary | ICD-10-CM | POA: Diagnosis not present

## 2020-11-12 DIAGNOSIS — E1142 Type 2 diabetes mellitus with diabetic polyneuropathy: Secondary | ICD-10-CM | POA: Diagnosis not present

## 2020-11-12 DIAGNOSIS — F17210 Nicotine dependence, cigarettes, uncomplicated: Secondary | ICD-10-CM | POA: Diagnosis not present

## 2020-11-12 DIAGNOSIS — F32A Depression, unspecified: Secondary | ICD-10-CM | POA: Diagnosis not present

## 2020-11-12 DIAGNOSIS — J449 Chronic obstructive pulmonary disease, unspecified: Secondary | ICD-10-CM | POA: Diagnosis not present

## 2020-11-13 DIAGNOSIS — J449 Chronic obstructive pulmonary disease, unspecified: Secondary | ICD-10-CM | POA: Diagnosis not present

## 2020-11-13 DIAGNOSIS — F32A Depression, unspecified: Secondary | ICD-10-CM | POA: Diagnosis not present

## 2020-11-13 DIAGNOSIS — E1142 Type 2 diabetes mellitus with diabetic polyneuropathy: Secondary | ICD-10-CM | POA: Diagnosis not present

## 2020-11-13 DIAGNOSIS — F17210 Nicotine dependence, cigarettes, uncomplicated: Secondary | ICD-10-CM | POA: Diagnosis not present

## 2020-11-13 DIAGNOSIS — J9621 Acute and chronic respiratory failure with hypoxia: Secondary | ICD-10-CM | POA: Diagnosis not present

## 2020-11-14 DIAGNOSIS — J449 Chronic obstructive pulmonary disease, unspecified: Secondary | ICD-10-CM | POA: Diagnosis not present

## 2020-11-14 DIAGNOSIS — E1142 Type 2 diabetes mellitus with diabetic polyneuropathy: Secondary | ICD-10-CM | POA: Diagnosis not present

## 2020-11-14 DIAGNOSIS — F32A Depression, unspecified: Secondary | ICD-10-CM | POA: Diagnosis not present

## 2020-11-14 DIAGNOSIS — J9621 Acute and chronic respiratory failure with hypoxia: Secondary | ICD-10-CM | POA: Diagnosis not present

## 2020-11-14 DIAGNOSIS — F17210 Nicotine dependence, cigarettes, uncomplicated: Secondary | ICD-10-CM | POA: Diagnosis not present

## 2020-11-17 DIAGNOSIS — E1142 Type 2 diabetes mellitus with diabetic polyneuropathy: Secondary | ICD-10-CM | POA: Diagnosis not present

## 2020-11-17 DIAGNOSIS — J9621 Acute and chronic respiratory failure with hypoxia: Secondary | ICD-10-CM | POA: Diagnosis not present

## 2020-11-17 DIAGNOSIS — J449 Chronic obstructive pulmonary disease, unspecified: Secondary | ICD-10-CM | POA: Diagnosis not present

## 2020-11-17 DIAGNOSIS — F17210 Nicotine dependence, cigarettes, uncomplicated: Secondary | ICD-10-CM | POA: Diagnosis not present

## 2020-11-17 DIAGNOSIS — F32A Depression, unspecified: Secondary | ICD-10-CM | POA: Diagnosis not present

## 2020-11-19 DIAGNOSIS — F17210 Nicotine dependence, cigarettes, uncomplicated: Secondary | ICD-10-CM | POA: Diagnosis not present

## 2020-11-19 DIAGNOSIS — E1142 Type 2 diabetes mellitus with diabetic polyneuropathy: Secondary | ICD-10-CM | POA: Diagnosis not present

## 2020-11-19 DIAGNOSIS — J449 Chronic obstructive pulmonary disease, unspecified: Secondary | ICD-10-CM | POA: Diagnosis not present

## 2020-11-19 DIAGNOSIS — J9621 Acute and chronic respiratory failure with hypoxia: Secondary | ICD-10-CM | POA: Diagnosis not present

## 2020-11-19 DIAGNOSIS — F32A Depression, unspecified: Secondary | ICD-10-CM | POA: Diagnosis not present

## 2020-11-20 DIAGNOSIS — E1142 Type 2 diabetes mellitus with diabetic polyneuropathy: Secondary | ICD-10-CM | POA: Diagnosis not present

## 2020-11-20 DIAGNOSIS — J9621 Acute and chronic respiratory failure with hypoxia: Secondary | ICD-10-CM | POA: Diagnosis not present

## 2020-11-20 DIAGNOSIS — F32A Depression, unspecified: Secondary | ICD-10-CM | POA: Diagnosis not present

## 2020-11-20 DIAGNOSIS — F17210 Nicotine dependence, cigarettes, uncomplicated: Secondary | ICD-10-CM | POA: Diagnosis not present

## 2020-11-20 DIAGNOSIS — J449 Chronic obstructive pulmonary disease, unspecified: Secondary | ICD-10-CM | POA: Diagnosis not present

## 2020-11-24 DIAGNOSIS — E1142 Type 2 diabetes mellitus with diabetic polyneuropathy: Secondary | ICD-10-CM | POA: Diagnosis not present

## 2020-11-24 DIAGNOSIS — F17210 Nicotine dependence, cigarettes, uncomplicated: Secondary | ICD-10-CM | POA: Diagnosis not present

## 2020-11-24 DIAGNOSIS — J9621 Acute and chronic respiratory failure with hypoxia: Secondary | ICD-10-CM | POA: Diagnosis not present

## 2020-11-24 DIAGNOSIS — F32A Depression, unspecified: Secondary | ICD-10-CM | POA: Diagnosis not present

## 2020-11-24 DIAGNOSIS — J449 Chronic obstructive pulmonary disease, unspecified: Secondary | ICD-10-CM | POA: Diagnosis not present

## 2020-11-25 DIAGNOSIS — F17210 Nicotine dependence, cigarettes, uncomplicated: Secondary | ICD-10-CM | POA: Diagnosis not present

## 2020-11-25 DIAGNOSIS — Z9981 Dependence on supplemental oxygen: Secondary | ICD-10-CM | POA: Diagnosis not present

## 2020-11-25 DIAGNOSIS — S4291XS Fracture of right shoulder girdle, part unspecified, sequela: Secondary | ICD-10-CM | POA: Diagnosis not present

## 2020-11-25 DIAGNOSIS — R32 Unspecified urinary incontinence: Secondary | ICD-10-CM | POA: Diagnosis not present

## 2020-11-25 DIAGNOSIS — J9621 Acute and chronic respiratory failure with hypoxia: Secondary | ICD-10-CM | POA: Diagnosis not present

## 2020-11-25 DIAGNOSIS — F32A Depression, unspecified: Secondary | ICD-10-CM | POA: Diagnosis not present

## 2020-11-25 DIAGNOSIS — H409 Unspecified glaucoma: Secondary | ICD-10-CM | POA: Diagnosis not present

## 2020-11-25 DIAGNOSIS — Z741 Need for assistance with personal care: Secondary | ICD-10-CM | POA: Diagnosis not present

## 2020-11-25 DIAGNOSIS — E1142 Type 2 diabetes mellitus with diabetic polyneuropathy: Secondary | ICD-10-CM | POA: Diagnosis not present

## 2020-11-25 DIAGNOSIS — K5900 Constipation, unspecified: Secondary | ICD-10-CM | POA: Diagnosis not present

## 2020-11-25 DIAGNOSIS — F259 Schizoaffective disorder, unspecified: Secondary | ICD-10-CM | POA: Diagnosis not present

## 2020-11-25 DIAGNOSIS — R159 Full incontinence of feces: Secondary | ICD-10-CM | POA: Diagnosis not present

## 2020-11-25 DIAGNOSIS — R251 Tremor, unspecified: Secondary | ICD-10-CM | POA: Diagnosis not present

## 2020-11-25 DIAGNOSIS — J449 Chronic obstructive pulmonary disease, unspecified: Secondary | ICD-10-CM | POA: Diagnosis not present

## 2020-11-25 DIAGNOSIS — Z6841 Body Mass Index (BMI) 40.0 and over, adult: Secondary | ICD-10-CM | POA: Diagnosis not present

## 2020-11-25 DIAGNOSIS — Z794 Long term (current) use of insulin: Secondary | ICD-10-CM | POA: Diagnosis not present

## 2020-11-25 DIAGNOSIS — I10 Essential (primary) hypertension: Secondary | ICD-10-CM | POA: Diagnosis not present

## 2020-11-26 DIAGNOSIS — J449 Chronic obstructive pulmonary disease, unspecified: Secondary | ICD-10-CM | POA: Diagnosis not present

## 2020-11-26 DIAGNOSIS — J9621 Acute and chronic respiratory failure with hypoxia: Secondary | ICD-10-CM | POA: Diagnosis not present

## 2020-11-26 DIAGNOSIS — F32A Depression, unspecified: Secondary | ICD-10-CM | POA: Diagnosis not present

## 2020-11-26 DIAGNOSIS — E1142 Type 2 diabetes mellitus with diabetic polyneuropathy: Secondary | ICD-10-CM | POA: Diagnosis not present

## 2020-11-26 DIAGNOSIS — F17210 Nicotine dependence, cigarettes, uncomplicated: Secondary | ICD-10-CM | POA: Diagnosis not present

## 2020-11-27 DIAGNOSIS — F32A Depression, unspecified: Secondary | ICD-10-CM | POA: Diagnosis not present

## 2020-11-27 DIAGNOSIS — J9621 Acute and chronic respiratory failure with hypoxia: Secondary | ICD-10-CM | POA: Diagnosis not present

## 2020-11-27 DIAGNOSIS — J449 Chronic obstructive pulmonary disease, unspecified: Secondary | ICD-10-CM | POA: Diagnosis not present

## 2020-11-27 DIAGNOSIS — E1142 Type 2 diabetes mellitus with diabetic polyneuropathy: Secondary | ICD-10-CM | POA: Diagnosis not present

## 2020-11-27 DIAGNOSIS — F17210 Nicotine dependence, cigarettes, uncomplicated: Secondary | ICD-10-CM | POA: Diagnosis not present

## 2020-11-28 DIAGNOSIS — J449 Chronic obstructive pulmonary disease, unspecified: Secondary | ICD-10-CM | POA: Diagnosis not present

## 2020-11-28 DIAGNOSIS — E1142 Type 2 diabetes mellitus with diabetic polyneuropathy: Secondary | ICD-10-CM | POA: Diagnosis not present

## 2020-11-28 DIAGNOSIS — F17210 Nicotine dependence, cigarettes, uncomplicated: Secondary | ICD-10-CM | POA: Diagnosis not present

## 2020-11-28 DIAGNOSIS — F32A Depression, unspecified: Secondary | ICD-10-CM | POA: Diagnosis not present

## 2020-11-28 DIAGNOSIS — J9621 Acute and chronic respiratory failure with hypoxia: Secondary | ICD-10-CM | POA: Diagnosis not present

## 2020-12-01 DIAGNOSIS — F17210 Nicotine dependence, cigarettes, uncomplicated: Secondary | ICD-10-CM | POA: Diagnosis not present

## 2020-12-01 DIAGNOSIS — J449 Chronic obstructive pulmonary disease, unspecified: Secondary | ICD-10-CM | POA: Diagnosis not present

## 2020-12-01 DIAGNOSIS — F32A Depression, unspecified: Secondary | ICD-10-CM | POA: Diagnosis not present

## 2020-12-01 DIAGNOSIS — E1142 Type 2 diabetes mellitus with diabetic polyneuropathy: Secondary | ICD-10-CM | POA: Diagnosis not present

## 2020-12-01 DIAGNOSIS — J9621 Acute and chronic respiratory failure with hypoxia: Secondary | ICD-10-CM | POA: Diagnosis not present

## 2020-12-03 DIAGNOSIS — F32A Depression, unspecified: Secondary | ICD-10-CM | POA: Diagnosis not present

## 2020-12-03 DIAGNOSIS — E1142 Type 2 diabetes mellitus with diabetic polyneuropathy: Secondary | ICD-10-CM | POA: Diagnosis not present

## 2020-12-03 DIAGNOSIS — F17210 Nicotine dependence, cigarettes, uncomplicated: Secondary | ICD-10-CM | POA: Diagnosis not present

## 2020-12-03 DIAGNOSIS — J449 Chronic obstructive pulmonary disease, unspecified: Secondary | ICD-10-CM | POA: Diagnosis not present

## 2020-12-03 DIAGNOSIS — J9621 Acute and chronic respiratory failure with hypoxia: Secondary | ICD-10-CM | POA: Diagnosis not present

## 2020-12-04 DIAGNOSIS — F32A Depression, unspecified: Secondary | ICD-10-CM | POA: Diagnosis not present

## 2020-12-04 DIAGNOSIS — J9621 Acute and chronic respiratory failure with hypoxia: Secondary | ICD-10-CM | POA: Diagnosis not present

## 2020-12-04 DIAGNOSIS — F17210 Nicotine dependence, cigarettes, uncomplicated: Secondary | ICD-10-CM | POA: Diagnosis not present

## 2020-12-04 DIAGNOSIS — E1142 Type 2 diabetes mellitus with diabetic polyneuropathy: Secondary | ICD-10-CM | POA: Diagnosis not present

## 2020-12-04 DIAGNOSIS — J449 Chronic obstructive pulmonary disease, unspecified: Secondary | ICD-10-CM | POA: Diagnosis not present

## 2020-12-05 DIAGNOSIS — J9621 Acute and chronic respiratory failure with hypoxia: Secondary | ICD-10-CM | POA: Diagnosis not present

## 2020-12-05 DIAGNOSIS — J449 Chronic obstructive pulmonary disease, unspecified: Secondary | ICD-10-CM | POA: Diagnosis not present

## 2020-12-05 DIAGNOSIS — E1142 Type 2 diabetes mellitus with diabetic polyneuropathy: Secondary | ICD-10-CM | POA: Diagnosis not present

## 2020-12-05 DIAGNOSIS — F17210 Nicotine dependence, cigarettes, uncomplicated: Secondary | ICD-10-CM | POA: Diagnosis not present

## 2020-12-05 DIAGNOSIS — F32A Depression, unspecified: Secondary | ICD-10-CM | POA: Diagnosis not present

## 2020-12-08 DIAGNOSIS — F32A Depression, unspecified: Secondary | ICD-10-CM | POA: Diagnosis not present

## 2020-12-08 DIAGNOSIS — E1142 Type 2 diabetes mellitus with diabetic polyneuropathy: Secondary | ICD-10-CM | POA: Diagnosis not present

## 2020-12-08 DIAGNOSIS — J449 Chronic obstructive pulmonary disease, unspecified: Secondary | ICD-10-CM | POA: Diagnosis not present

## 2020-12-08 DIAGNOSIS — F17210 Nicotine dependence, cigarettes, uncomplicated: Secondary | ICD-10-CM | POA: Diagnosis not present

## 2020-12-08 DIAGNOSIS — J9621 Acute and chronic respiratory failure with hypoxia: Secondary | ICD-10-CM | POA: Diagnosis not present

## 2020-12-10 DIAGNOSIS — E1142 Type 2 diabetes mellitus with diabetic polyneuropathy: Secondary | ICD-10-CM | POA: Diagnosis not present

## 2020-12-10 DIAGNOSIS — J449 Chronic obstructive pulmonary disease, unspecified: Secondary | ICD-10-CM | POA: Diagnosis not present

## 2020-12-10 DIAGNOSIS — J9621 Acute and chronic respiratory failure with hypoxia: Secondary | ICD-10-CM | POA: Diagnosis not present

## 2020-12-10 DIAGNOSIS — F17210 Nicotine dependence, cigarettes, uncomplicated: Secondary | ICD-10-CM | POA: Diagnosis not present

## 2020-12-10 DIAGNOSIS — F32A Depression, unspecified: Secondary | ICD-10-CM | POA: Diagnosis not present

## 2020-12-11 DIAGNOSIS — J9621 Acute and chronic respiratory failure with hypoxia: Secondary | ICD-10-CM | POA: Diagnosis not present

## 2020-12-11 DIAGNOSIS — F32A Depression, unspecified: Secondary | ICD-10-CM | POA: Diagnosis not present

## 2020-12-11 DIAGNOSIS — F17210 Nicotine dependence, cigarettes, uncomplicated: Secondary | ICD-10-CM | POA: Diagnosis not present

## 2020-12-11 DIAGNOSIS — J449 Chronic obstructive pulmonary disease, unspecified: Secondary | ICD-10-CM | POA: Diagnosis not present

## 2020-12-11 DIAGNOSIS — E1142 Type 2 diabetes mellitus with diabetic polyneuropathy: Secondary | ICD-10-CM | POA: Diagnosis not present

## 2020-12-12 DIAGNOSIS — J449 Chronic obstructive pulmonary disease, unspecified: Secondary | ICD-10-CM | POA: Diagnosis not present

## 2020-12-12 DIAGNOSIS — F32A Depression, unspecified: Secondary | ICD-10-CM | POA: Diagnosis not present

## 2020-12-12 DIAGNOSIS — F17210 Nicotine dependence, cigarettes, uncomplicated: Secondary | ICD-10-CM | POA: Diagnosis not present

## 2020-12-12 DIAGNOSIS — J9621 Acute and chronic respiratory failure with hypoxia: Secondary | ICD-10-CM | POA: Diagnosis not present

## 2020-12-12 DIAGNOSIS — E1142 Type 2 diabetes mellitus with diabetic polyneuropathy: Secondary | ICD-10-CM | POA: Diagnosis not present

## 2020-12-15 DIAGNOSIS — J9621 Acute and chronic respiratory failure with hypoxia: Secondary | ICD-10-CM | POA: Diagnosis not present

## 2020-12-15 DIAGNOSIS — F32A Depression, unspecified: Secondary | ICD-10-CM | POA: Diagnosis not present

## 2020-12-15 DIAGNOSIS — F17210 Nicotine dependence, cigarettes, uncomplicated: Secondary | ICD-10-CM | POA: Diagnosis not present

## 2020-12-15 DIAGNOSIS — E1142 Type 2 diabetes mellitus with diabetic polyneuropathy: Secondary | ICD-10-CM | POA: Diagnosis not present

## 2020-12-15 DIAGNOSIS — J449 Chronic obstructive pulmonary disease, unspecified: Secondary | ICD-10-CM | POA: Diagnosis not present

## 2020-12-16 DIAGNOSIS — Z20822 Contact with and (suspected) exposure to covid-19: Secondary | ICD-10-CM | POA: Diagnosis not present

## 2020-12-17 DIAGNOSIS — J9621 Acute and chronic respiratory failure with hypoxia: Secondary | ICD-10-CM | POA: Diagnosis not present

## 2020-12-17 DIAGNOSIS — J449 Chronic obstructive pulmonary disease, unspecified: Secondary | ICD-10-CM | POA: Diagnosis not present

## 2020-12-17 DIAGNOSIS — E1142 Type 2 diabetes mellitus with diabetic polyneuropathy: Secondary | ICD-10-CM | POA: Diagnosis not present

## 2020-12-17 DIAGNOSIS — F32A Depression, unspecified: Secondary | ICD-10-CM | POA: Diagnosis not present

## 2020-12-17 DIAGNOSIS — F17210 Nicotine dependence, cigarettes, uncomplicated: Secondary | ICD-10-CM | POA: Diagnosis not present

## 2020-12-19 DIAGNOSIS — E1142 Type 2 diabetes mellitus with diabetic polyneuropathy: Secondary | ICD-10-CM | POA: Diagnosis not present

## 2020-12-19 DIAGNOSIS — F17210 Nicotine dependence, cigarettes, uncomplicated: Secondary | ICD-10-CM | POA: Diagnosis not present

## 2020-12-19 DIAGNOSIS — F32A Depression, unspecified: Secondary | ICD-10-CM | POA: Diagnosis not present

## 2020-12-19 DIAGNOSIS — J9621 Acute and chronic respiratory failure with hypoxia: Secondary | ICD-10-CM | POA: Diagnosis not present

## 2020-12-19 DIAGNOSIS — J449 Chronic obstructive pulmonary disease, unspecified: Secondary | ICD-10-CM | POA: Diagnosis not present

## 2020-12-22 DIAGNOSIS — F32A Depression, unspecified: Secondary | ICD-10-CM | POA: Diagnosis not present

## 2020-12-22 DIAGNOSIS — J449 Chronic obstructive pulmonary disease, unspecified: Secondary | ICD-10-CM | POA: Diagnosis not present

## 2020-12-22 DIAGNOSIS — F17210 Nicotine dependence, cigarettes, uncomplicated: Secondary | ICD-10-CM | POA: Diagnosis not present

## 2020-12-22 DIAGNOSIS — E1142 Type 2 diabetes mellitus with diabetic polyneuropathy: Secondary | ICD-10-CM | POA: Diagnosis not present

## 2020-12-22 DIAGNOSIS — J9621 Acute and chronic respiratory failure with hypoxia: Secondary | ICD-10-CM | POA: Diagnosis not present

## 2020-12-24 DIAGNOSIS — J449 Chronic obstructive pulmonary disease, unspecified: Secondary | ICD-10-CM | POA: Diagnosis not present

## 2020-12-24 DIAGNOSIS — J9621 Acute and chronic respiratory failure with hypoxia: Secondary | ICD-10-CM | POA: Diagnosis not present

## 2020-12-24 DIAGNOSIS — F17210 Nicotine dependence, cigarettes, uncomplicated: Secondary | ICD-10-CM | POA: Diagnosis not present

## 2020-12-24 DIAGNOSIS — F32A Depression, unspecified: Secondary | ICD-10-CM | POA: Diagnosis not present

## 2020-12-24 DIAGNOSIS — E1142 Type 2 diabetes mellitus with diabetic polyneuropathy: Secondary | ICD-10-CM | POA: Diagnosis not present

## 2020-12-25 DIAGNOSIS — K5900 Constipation, unspecified: Secondary | ICD-10-CM | POA: Diagnosis not present

## 2020-12-25 DIAGNOSIS — S4291XS Fracture of right shoulder girdle, part unspecified, sequela: Secondary | ICD-10-CM | POA: Diagnosis not present

## 2020-12-25 DIAGNOSIS — R32 Unspecified urinary incontinence: Secondary | ICD-10-CM | POA: Diagnosis not present

## 2020-12-25 DIAGNOSIS — J9621 Acute and chronic respiratory failure with hypoxia: Secondary | ICD-10-CM | POA: Diagnosis not present

## 2020-12-25 DIAGNOSIS — I10 Essential (primary) hypertension: Secondary | ICD-10-CM | POA: Diagnosis not present

## 2020-12-25 DIAGNOSIS — E1142 Type 2 diabetes mellitus with diabetic polyneuropathy: Secondary | ICD-10-CM | POA: Diagnosis not present

## 2020-12-25 DIAGNOSIS — J449 Chronic obstructive pulmonary disease, unspecified: Secondary | ICD-10-CM | POA: Diagnosis not present

## 2020-12-25 DIAGNOSIS — Z6841 Body Mass Index (BMI) 40.0 and over, adult: Secondary | ICD-10-CM | POA: Diagnosis not present

## 2020-12-25 DIAGNOSIS — F259 Schizoaffective disorder, unspecified: Secondary | ICD-10-CM | POA: Diagnosis not present

## 2020-12-25 DIAGNOSIS — Z794 Long term (current) use of insulin: Secondary | ICD-10-CM | POA: Diagnosis not present

## 2020-12-25 DIAGNOSIS — R251 Tremor, unspecified: Secondary | ICD-10-CM | POA: Diagnosis not present

## 2020-12-25 DIAGNOSIS — Z9981 Dependence on supplemental oxygen: Secondary | ICD-10-CM | POA: Diagnosis not present

## 2020-12-25 DIAGNOSIS — F32A Depression, unspecified: Secondary | ICD-10-CM | POA: Diagnosis not present

## 2020-12-25 DIAGNOSIS — Z741 Need for assistance with personal care: Secondary | ICD-10-CM | POA: Diagnosis not present

## 2020-12-25 DIAGNOSIS — H409 Unspecified glaucoma: Secondary | ICD-10-CM | POA: Diagnosis not present

## 2020-12-25 DIAGNOSIS — F17210 Nicotine dependence, cigarettes, uncomplicated: Secondary | ICD-10-CM | POA: Diagnosis not present

## 2020-12-25 DIAGNOSIS — R159 Full incontinence of feces: Secondary | ICD-10-CM | POA: Diagnosis not present

## 2020-12-26 DIAGNOSIS — J449 Chronic obstructive pulmonary disease, unspecified: Secondary | ICD-10-CM | POA: Diagnosis not present

## 2020-12-26 DIAGNOSIS — J9621 Acute and chronic respiratory failure with hypoxia: Secondary | ICD-10-CM | POA: Diagnosis not present

## 2020-12-26 DIAGNOSIS — F32A Depression, unspecified: Secondary | ICD-10-CM | POA: Diagnosis not present

## 2020-12-26 DIAGNOSIS — E1142 Type 2 diabetes mellitus with diabetic polyneuropathy: Secondary | ICD-10-CM | POA: Diagnosis not present

## 2020-12-26 DIAGNOSIS — F17210 Nicotine dependence, cigarettes, uncomplicated: Secondary | ICD-10-CM | POA: Diagnosis not present

## 2020-12-26 IMAGING — DX DG CHEST 1V PORT
1 series · 1 of 1 positions shown · non-contrast
Comparison: April 10, 2018.

CLINICAL DATA: Fatigue, hypoglycemia.

EXAM:
PORTABLE CHEST 1 VIEW

[chest ap]
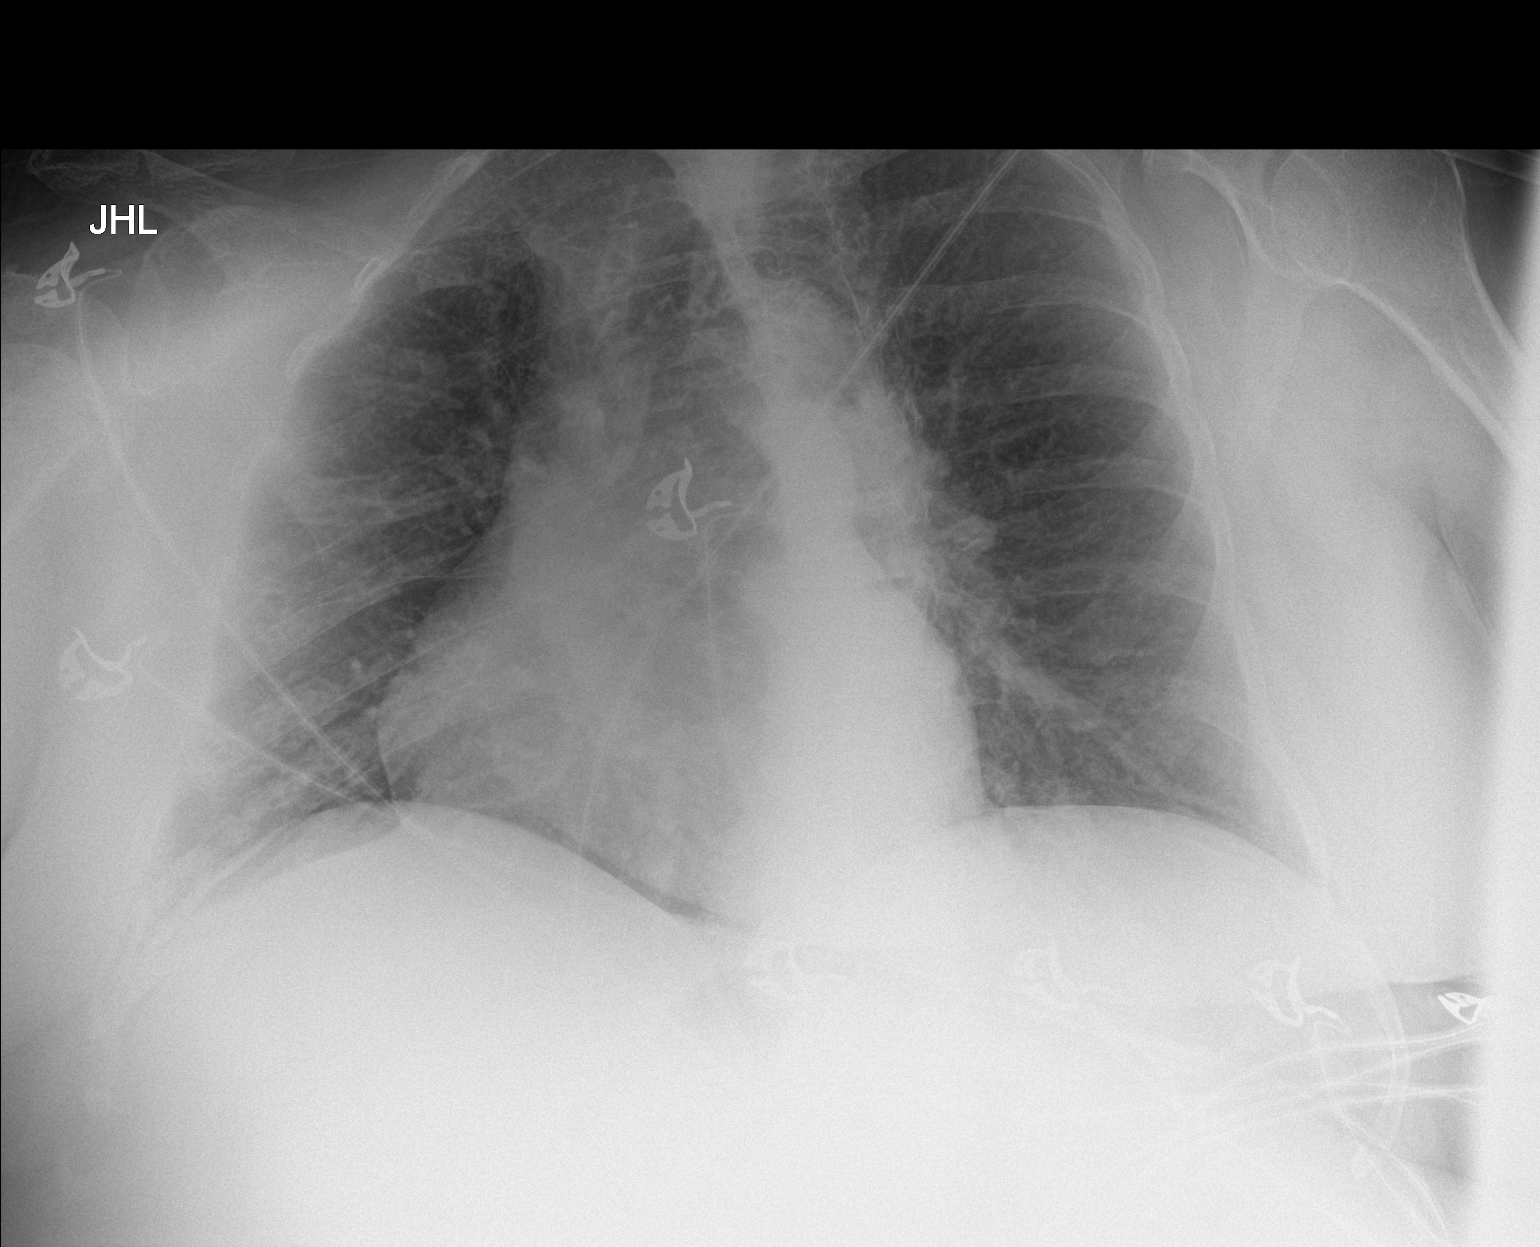

[1 of 1 positions shown; findings below may reference images not displayed]

FINDINGS: The heart size and mediastinal contours are within normal limits.
Both lungs are clear. The visualized skeletal structures are
unremarkable.
IMPRESSION: No active disease.

## 2020-12-29 DIAGNOSIS — F32A Depression, unspecified: Secondary | ICD-10-CM | POA: Diagnosis not present

## 2020-12-29 DIAGNOSIS — E1142 Type 2 diabetes mellitus with diabetic polyneuropathy: Secondary | ICD-10-CM | POA: Diagnosis not present

## 2020-12-29 DIAGNOSIS — J449 Chronic obstructive pulmonary disease, unspecified: Secondary | ICD-10-CM | POA: Diagnosis not present

## 2020-12-29 DIAGNOSIS — J9621 Acute and chronic respiratory failure with hypoxia: Secondary | ICD-10-CM | POA: Diagnosis not present

## 2020-12-29 DIAGNOSIS — F17210 Nicotine dependence, cigarettes, uncomplicated: Secondary | ICD-10-CM | POA: Diagnosis not present

## 2020-12-31 DIAGNOSIS — E1142 Type 2 diabetes mellitus with diabetic polyneuropathy: Secondary | ICD-10-CM | POA: Diagnosis not present

## 2020-12-31 DIAGNOSIS — F32A Depression, unspecified: Secondary | ICD-10-CM | POA: Diagnosis not present

## 2020-12-31 DIAGNOSIS — J9621 Acute and chronic respiratory failure with hypoxia: Secondary | ICD-10-CM | POA: Diagnosis not present

## 2020-12-31 DIAGNOSIS — F17210 Nicotine dependence, cigarettes, uncomplicated: Secondary | ICD-10-CM | POA: Diagnosis not present

## 2020-12-31 DIAGNOSIS — J449 Chronic obstructive pulmonary disease, unspecified: Secondary | ICD-10-CM | POA: Diagnosis not present

## 2021-01-01 DIAGNOSIS — J449 Chronic obstructive pulmonary disease, unspecified: Secondary | ICD-10-CM | POA: Diagnosis not present

## 2021-01-01 DIAGNOSIS — J9621 Acute and chronic respiratory failure with hypoxia: Secondary | ICD-10-CM | POA: Diagnosis not present

## 2021-01-01 DIAGNOSIS — E1142 Type 2 diabetes mellitus with diabetic polyneuropathy: Secondary | ICD-10-CM | POA: Diagnosis not present

## 2021-01-01 DIAGNOSIS — F32A Depression, unspecified: Secondary | ICD-10-CM | POA: Diagnosis not present

## 2021-01-01 DIAGNOSIS — F17210 Nicotine dependence, cigarettes, uncomplicated: Secondary | ICD-10-CM | POA: Diagnosis not present

## 2021-01-02 DIAGNOSIS — J449 Chronic obstructive pulmonary disease, unspecified: Secondary | ICD-10-CM | POA: Diagnosis not present

## 2021-01-02 DIAGNOSIS — J9621 Acute and chronic respiratory failure with hypoxia: Secondary | ICD-10-CM | POA: Diagnosis not present

## 2021-01-02 DIAGNOSIS — F17210 Nicotine dependence, cigarettes, uncomplicated: Secondary | ICD-10-CM | POA: Diagnosis not present

## 2021-01-02 DIAGNOSIS — F32A Depression, unspecified: Secondary | ICD-10-CM | POA: Diagnosis not present

## 2021-01-02 DIAGNOSIS — E1142 Type 2 diabetes mellitus with diabetic polyneuropathy: Secondary | ICD-10-CM | POA: Diagnosis not present

## 2021-01-05 DIAGNOSIS — J9621 Acute and chronic respiratory failure with hypoxia: Secondary | ICD-10-CM | POA: Diagnosis not present

## 2021-01-05 DIAGNOSIS — F17210 Nicotine dependence, cigarettes, uncomplicated: Secondary | ICD-10-CM | POA: Diagnosis not present

## 2021-01-05 DIAGNOSIS — F32A Depression, unspecified: Secondary | ICD-10-CM | POA: Diagnosis not present

## 2021-01-05 DIAGNOSIS — E1142 Type 2 diabetes mellitus with diabetic polyneuropathy: Secondary | ICD-10-CM | POA: Diagnosis not present

## 2021-01-05 DIAGNOSIS — J449 Chronic obstructive pulmonary disease, unspecified: Secondary | ICD-10-CM | POA: Diagnosis not present

## 2021-01-07 DIAGNOSIS — F17210 Nicotine dependence, cigarettes, uncomplicated: Secondary | ICD-10-CM | POA: Diagnosis not present

## 2021-01-07 DIAGNOSIS — E1142 Type 2 diabetes mellitus with diabetic polyneuropathy: Secondary | ICD-10-CM | POA: Diagnosis not present

## 2021-01-07 DIAGNOSIS — J449 Chronic obstructive pulmonary disease, unspecified: Secondary | ICD-10-CM | POA: Diagnosis not present

## 2021-01-07 DIAGNOSIS — F32A Depression, unspecified: Secondary | ICD-10-CM | POA: Diagnosis not present

## 2021-01-07 DIAGNOSIS — J9621 Acute and chronic respiratory failure with hypoxia: Secondary | ICD-10-CM | POA: Diagnosis not present

## 2021-01-08 DIAGNOSIS — E1142 Type 2 diabetes mellitus with diabetic polyneuropathy: Secondary | ICD-10-CM | POA: Diagnosis not present

## 2021-01-08 DIAGNOSIS — F32A Depression, unspecified: Secondary | ICD-10-CM | POA: Diagnosis not present

## 2021-01-08 DIAGNOSIS — F17210 Nicotine dependence, cigarettes, uncomplicated: Secondary | ICD-10-CM | POA: Diagnosis not present

## 2021-01-08 DIAGNOSIS — J449 Chronic obstructive pulmonary disease, unspecified: Secondary | ICD-10-CM | POA: Diagnosis not present

## 2021-01-08 DIAGNOSIS — J9621 Acute and chronic respiratory failure with hypoxia: Secondary | ICD-10-CM | POA: Diagnosis not present

## 2021-01-09 DIAGNOSIS — F32A Depression, unspecified: Secondary | ICD-10-CM | POA: Diagnosis not present

## 2021-01-09 DIAGNOSIS — J449 Chronic obstructive pulmonary disease, unspecified: Secondary | ICD-10-CM | POA: Diagnosis not present

## 2021-01-09 DIAGNOSIS — E1142 Type 2 diabetes mellitus with diabetic polyneuropathy: Secondary | ICD-10-CM | POA: Diagnosis not present

## 2021-01-09 DIAGNOSIS — F17210 Nicotine dependence, cigarettes, uncomplicated: Secondary | ICD-10-CM | POA: Diagnosis not present

## 2021-01-09 DIAGNOSIS — J9621 Acute and chronic respiratory failure with hypoxia: Secondary | ICD-10-CM | POA: Diagnosis not present

## 2021-01-12 DIAGNOSIS — E1142 Type 2 diabetes mellitus with diabetic polyneuropathy: Secondary | ICD-10-CM | POA: Diagnosis not present

## 2021-01-12 DIAGNOSIS — J9621 Acute and chronic respiratory failure with hypoxia: Secondary | ICD-10-CM | POA: Diagnosis not present

## 2021-01-12 DIAGNOSIS — F32A Depression, unspecified: Secondary | ICD-10-CM | POA: Diagnosis not present

## 2021-01-12 DIAGNOSIS — J449 Chronic obstructive pulmonary disease, unspecified: Secondary | ICD-10-CM | POA: Diagnosis not present

## 2021-01-12 DIAGNOSIS — F17210 Nicotine dependence, cigarettes, uncomplicated: Secondary | ICD-10-CM | POA: Diagnosis not present

## 2021-01-14 DIAGNOSIS — J9621 Acute and chronic respiratory failure with hypoxia: Secondary | ICD-10-CM | POA: Diagnosis not present

## 2021-01-14 DIAGNOSIS — F32A Depression, unspecified: Secondary | ICD-10-CM | POA: Diagnosis not present

## 2021-01-14 DIAGNOSIS — E1142 Type 2 diabetes mellitus with diabetic polyneuropathy: Secondary | ICD-10-CM | POA: Diagnosis not present

## 2021-01-14 DIAGNOSIS — H401312 Pigmentary glaucoma, right eye, moderate stage: Secondary | ICD-10-CM | POA: Diagnosis not present

## 2021-01-14 DIAGNOSIS — F17210 Nicotine dependence, cigarettes, uncomplicated: Secondary | ICD-10-CM | POA: Diagnosis not present

## 2021-01-14 DIAGNOSIS — J449 Chronic obstructive pulmonary disease, unspecified: Secondary | ICD-10-CM | POA: Diagnosis not present

## 2021-01-15 DIAGNOSIS — F17210 Nicotine dependence, cigarettes, uncomplicated: Secondary | ICD-10-CM | POA: Diagnosis not present

## 2021-01-15 DIAGNOSIS — J9621 Acute and chronic respiratory failure with hypoxia: Secondary | ICD-10-CM | POA: Diagnosis not present

## 2021-01-15 DIAGNOSIS — F32A Depression, unspecified: Secondary | ICD-10-CM | POA: Diagnosis not present

## 2021-01-15 DIAGNOSIS — E1142 Type 2 diabetes mellitus with diabetic polyneuropathy: Secondary | ICD-10-CM | POA: Diagnosis not present

## 2021-01-15 DIAGNOSIS — J449 Chronic obstructive pulmonary disease, unspecified: Secondary | ICD-10-CM | POA: Diagnosis not present

## 2021-01-16 DIAGNOSIS — E1142 Type 2 diabetes mellitus with diabetic polyneuropathy: Secondary | ICD-10-CM | POA: Diagnosis not present

## 2021-01-16 DIAGNOSIS — F32A Depression, unspecified: Secondary | ICD-10-CM | POA: Diagnosis not present

## 2021-01-16 DIAGNOSIS — J449 Chronic obstructive pulmonary disease, unspecified: Secondary | ICD-10-CM | POA: Diagnosis not present

## 2021-01-16 DIAGNOSIS — J9621 Acute and chronic respiratory failure with hypoxia: Secondary | ICD-10-CM | POA: Diagnosis not present

## 2021-01-16 DIAGNOSIS — F17210 Nicotine dependence, cigarettes, uncomplicated: Secondary | ICD-10-CM | POA: Diagnosis not present

## 2021-01-19 DIAGNOSIS — F17210 Nicotine dependence, cigarettes, uncomplicated: Secondary | ICD-10-CM | POA: Diagnosis not present

## 2021-01-19 DIAGNOSIS — J9621 Acute and chronic respiratory failure with hypoxia: Secondary | ICD-10-CM | POA: Diagnosis not present

## 2021-01-19 DIAGNOSIS — E1142 Type 2 diabetes mellitus with diabetic polyneuropathy: Secondary | ICD-10-CM | POA: Diagnosis not present

## 2021-01-19 DIAGNOSIS — J449 Chronic obstructive pulmonary disease, unspecified: Secondary | ICD-10-CM | POA: Diagnosis not present

## 2021-01-19 DIAGNOSIS — F32A Depression, unspecified: Secondary | ICD-10-CM | POA: Diagnosis not present

## 2021-01-21 DIAGNOSIS — F17210 Nicotine dependence, cigarettes, uncomplicated: Secondary | ICD-10-CM | POA: Diagnosis not present

## 2021-01-21 DIAGNOSIS — F32A Depression, unspecified: Secondary | ICD-10-CM | POA: Diagnosis not present

## 2021-01-21 DIAGNOSIS — J9621 Acute and chronic respiratory failure with hypoxia: Secondary | ICD-10-CM | POA: Diagnosis not present

## 2021-01-21 DIAGNOSIS — E1142 Type 2 diabetes mellitus with diabetic polyneuropathy: Secondary | ICD-10-CM | POA: Diagnosis not present

## 2021-01-21 DIAGNOSIS — J449 Chronic obstructive pulmonary disease, unspecified: Secondary | ICD-10-CM | POA: Diagnosis not present

## 2021-01-22 DIAGNOSIS — F32A Depression, unspecified: Secondary | ICD-10-CM | POA: Diagnosis not present

## 2021-01-22 DIAGNOSIS — F17210 Nicotine dependence, cigarettes, uncomplicated: Secondary | ICD-10-CM | POA: Diagnosis not present

## 2021-01-22 DIAGNOSIS — J9621 Acute and chronic respiratory failure with hypoxia: Secondary | ICD-10-CM | POA: Diagnosis not present

## 2021-01-22 DIAGNOSIS — J449 Chronic obstructive pulmonary disease, unspecified: Secondary | ICD-10-CM | POA: Diagnosis not present

## 2021-01-22 DIAGNOSIS — E1142 Type 2 diabetes mellitus with diabetic polyneuropathy: Secondary | ICD-10-CM | POA: Diagnosis not present

## 2021-01-23 DIAGNOSIS — J9621 Acute and chronic respiratory failure with hypoxia: Secondary | ICD-10-CM | POA: Diagnosis not present

## 2021-01-23 DIAGNOSIS — E1142 Type 2 diabetes mellitus with diabetic polyneuropathy: Secondary | ICD-10-CM | POA: Diagnosis not present

## 2021-01-23 DIAGNOSIS — F17210 Nicotine dependence, cigarettes, uncomplicated: Secondary | ICD-10-CM | POA: Diagnosis not present

## 2021-01-23 DIAGNOSIS — J449 Chronic obstructive pulmonary disease, unspecified: Secondary | ICD-10-CM | POA: Diagnosis not present

## 2021-01-23 DIAGNOSIS — F32A Depression, unspecified: Secondary | ICD-10-CM | POA: Diagnosis not present

## 2021-01-25 DIAGNOSIS — F32A Depression, unspecified: Secondary | ICD-10-CM | POA: Diagnosis not present

## 2021-01-25 DIAGNOSIS — I10 Essential (primary) hypertension: Secondary | ICD-10-CM | POA: Diagnosis not present

## 2021-01-25 DIAGNOSIS — K5900 Constipation, unspecified: Secondary | ICD-10-CM | POA: Diagnosis not present

## 2021-01-25 DIAGNOSIS — J449 Chronic obstructive pulmonary disease, unspecified: Secondary | ICD-10-CM | POA: Diagnosis not present

## 2021-01-25 DIAGNOSIS — S4291XS Fracture of right shoulder girdle, part unspecified, sequela: Secondary | ICD-10-CM | POA: Diagnosis not present

## 2021-01-25 DIAGNOSIS — J9621 Acute and chronic respiratory failure with hypoxia: Secondary | ICD-10-CM | POA: Diagnosis not present

## 2021-01-25 DIAGNOSIS — F17210 Nicotine dependence, cigarettes, uncomplicated: Secondary | ICD-10-CM | POA: Diagnosis not present

## 2021-01-25 DIAGNOSIS — R32 Unspecified urinary incontinence: Secondary | ICD-10-CM | POA: Diagnosis not present

## 2021-01-26 DIAGNOSIS — F32A Depression, unspecified: Secondary | ICD-10-CM | POA: Diagnosis not present

## 2021-01-26 DIAGNOSIS — J449 Chronic obstructive pulmonary disease, unspecified: Secondary | ICD-10-CM | POA: Diagnosis not present

## 2021-01-26 DIAGNOSIS — I10 Essential (primary) hypertension: Secondary | ICD-10-CM | POA: Diagnosis not present

## 2021-01-26 DIAGNOSIS — F17210 Nicotine dependence, cigarettes, uncomplicated: Secondary | ICD-10-CM | POA: Diagnosis not present

## 2021-01-26 DIAGNOSIS — J9621 Acute and chronic respiratory failure with hypoxia: Secondary | ICD-10-CM | POA: Diagnosis not present

## 2021-01-28 DIAGNOSIS — F17210 Nicotine dependence, cigarettes, uncomplicated: Secondary | ICD-10-CM | POA: Diagnosis not present

## 2021-01-28 DIAGNOSIS — F32A Depression, unspecified: Secondary | ICD-10-CM | POA: Diagnosis not present

## 2021-01-28 DIAGNOSIS — J449 Chronic obstructive pulmonary disease, unspecified: Secondary | ICD-10-CM | POA: Diagnosis not present

## 2021-01-28 DIAGNOSIS — I10 Essential (primary) hypertension: Secondary | ICD-10-CM | POA: Diagnosis not present

## 2021-01-28 DIAGNOSIS — J9621 Acute and chronic respiratory failure with hypoxia: Secondary | ICD-10-CM | POA: Diagnosis not present

## 2021-01-29 DIAGNOSIS — J9621 Acute and chronic respiratory failure with hypoxia: Secondary | ICD-10-CM | POA: Diagnosis not present

## 2021-01-29 DIAGNOSIS — J449 Chronic obstructive pulmonary disease, unspecified: Secondary | ICD-10-CM | POA: Diagnosis not present

## 2021-01-29 DIAGNOSIS — F17210 Nicotine dependence, cigarettes, uncomplicated: Secondary | ICD-10-CM | POA: Diagnosis not present

## 2021-01-29 DIAGNOSIS — I10 Essential (primary) hypertension: Secondary | ICD-10-CM | POA: Diagnosis not present

## 2021-01-29 DIAGNOSIS — F32A Depression, unspecified: Secondary | ICD-10-CM | POA: Diagnosis not present

## 2021-01-30 DIAGNOSIS — J9621 Acute and chronic respiratory failure with hypoxia: Secondary | ICD-10-CM | POA: Diagnosis not present

## 2021-01-30 DIAGNOSIS — F17210 Nicotine dependence, cigarettes, uncomplicated: Secondary | ICD-10-CM | POA: Diagnosis not present

## 2021-01-30 DIAGNOSIS — J449 Chronic obstructive pulmonary disease, unspecified: Secondary | ICD-10-CM | POA: Diagnosis not present

## 2021-01-30 DIAGNOSIS — I10 Essential (primary) hypertension: Secondary | ICD-10-CM | POA: Diagnosis not present

## 2021-01-30 DIAGNOSIS — F32A Depression, unspecified: Secondary | ICD-10-CM | POA: Diagnosis not present

## 2021-02-02 DIAGNOSIS — F17210 Nicotine dependence, cigarettes, uncomplicated: Secondary | ICD-10-CM | POA: Diagnosis not present

## 2021-02-02 DIAGNOSIS — F32A Depression, unspecified: Secondary | ICD-10-CM | POA: Diagnosis not present

## 2021-02-02 DIAGNOSIS — J9621 Acute and chronic respiratory failure with hypoxia: Secondary | ICD-10-CM | POA: Diagnosis not present

## 2021-02-02 DIAGNOSIS — I10 Essential (primary) hypertension: Secondary | ICD-10-CM | POA: Diagnosis not present

## 2021-02-02 DIAGNOSIS — J449 Chronic obstructive pulmonary disease, unspecified: Secondary | ICD-10-CM | POA: Diagnosis not present

## 2021-02-04 DIAGNOSIS — J449 Chronic obstructive pulmonary disease, unspecified: Secondary | ICD-10-CM | POA: Diagnosis not present

## 2021-02-04 DIAGNOSIS — J9621 Acute and chronic respiratory failure with hypoxia: Secondary | ICD-10-CM | POA: Diagnosis not present

## 2021-02-04 DIAGNOSIS — F17210 Nicotine dependence, cigarettes, uncomplicated: Secondary | ICD-10-CM | POA: Diagnosis not present

## 2021-02-04 DIAGNOSIS — F32A Depression, unspecified: Secondary | ICD-10-CM | POA: Diagnosis not present

## 2021-02-04 DIAGNOSIS — I10 Essential (primary) hypertension: Secondary | ICD-10-CM | POA: Diagnosis not present

## 2021-02-05 DIAGNOSIS — F32A Depression, unspecified: Secondary | ICD-10-CM | POA: Diagnosis not present

## 2021-02-05 DIAGNOSIS — J449 Chronic obstructive pulmonary disease, unspecified: Secondary | ICD-10-CM | POA: Diagnosis not present

## 2021-02-05 DIAGNOSIS — I10 Essential (primary) hypertension: Secondary | ICD-10-CM | POA: Diagnosis not present

## 2021-02-05 DIAGNOSIS — J9621 Acute and chronic respiratory failure with hypoxia: Secondary | ICD-10-CM | POA: Diagnosis not present

## 2021-02-05 DIAGNOSIS — F17210 Nicotine dependence, cigarettes, uncomplicated: Secondary | ICD-10-CM | POA: Diagnosis not present

## 2021-02-06 DIAGNOSIS — J9621 Acute and chronic respiratory failure with hypoxia: Secondary | ICD-10-CM | POA: Diagnosis not present

## 2021-02-06 DIAGNOSIS — F17210 Nicotine dependence, cigarettes, uncomplicated: Secondary | ICD-10-CM | POA: Diagnosis not present

## 2021-02-06 DIAGNOSIS — J449 Chronic obstructive pulmonary disease, unspecified: Secondary | ICD-10-CM | POA: Diagnosis not present

## 2021-02-06 DIAGNOSIS — F32A Depression, unspecified: Secondary | ICD-10-CM | POA: Diagnosis not present

## 2021-02-06 DIAGNOSIS — I10 Essential (primary) hypertension: Secondary | ICD-10-CM | POA: Diagnosis not present

## 2021-02-09 DIAGNOSIS — I10 Essential (primary) hypertension: Secondary | ICD-10-CM | POA: Diagnosis not present

## 2021-02-09 DIAGNOSIS — F32A Depression, unspecified: Secondary | ICD-10-CM | POA: Diagnosis not present

## 2021-02-09 DIAGNOSIS — F17210 Nicotine dependence, cigarettes, uncomplicated: Secondary | ICD-10-CM | POA: Diagnosis not present

## 2021-02-09 DIAGNOSIS — J449 Chronic obstructive pulmonary disease, unspecified: Secondary | ICD-10-CM | POA: Diagnosis not present

## 2021-02-09 DIAGNOSIS — J9621 Acute and chronic respiratory failure with hypoxia: Secondary | ICD-10-CM | POA: Diagnosis not present

## 2021-02-11 DIAGNOSIS — F17210 Nicotine dependence, cigarettes, uncomplicated: Secondary | ICD-10-CM | POA: Diagnosis not present

## 2021-02-11 DIAGNOSIS — I10 Essential (primary) hypertension: Secondary | ICD-10-CM | POA: Diagnosis not present

## 2021-02-11 DIAGNOSIS — J9621 Acute and chronic respiratory failure with hypoxia: Secondary | ICD-10-CM | POA: Diagnosis not present

## 2021-02-11 DIAGNOSIS — F32A Depression, unspecified: Secondary | ICD-10-CM | POA: Diagnosis not present

## 2021-02-11 DIAGNOSIS — J449 Chronic obstructive pulmonary disease, unspecified: Secondary | ICD-10-CM | POA: Diagnosis not present

## 2021-02-12 DIAGNOSIS — F32A Depression, unspecified: Secondary | ICD-10-CM | POA: Diagnosis not present

## 2021-02-12 DIAGNOSIS — J449 Chronic obstructive pulmonary disease, unspecified: Secondary | ICD-10-CM | POA: Diagnosis not present

## 2021-02-12 DIAGNOSIS — I10 Essential (primary) hypertension: Secondary | ICD-10-CM | POA: Diagnosis not present

## 2021-02-12 DIAGNOSIS — F17210 Nicotine dependence, cigarettes, uncomplicated: Secondary | ICD-10-CM | POA: Diagnosis not present

## 2021-02-12 DIAGNOSIS — J9621 Acute and chronic respiratory failure with hypoxia: Secondary | ICD-10-CM | POA: Diagnosis not present

## 2021-02-13 DIAGNOSIS — I10 Essential (primary) hypertension: Secondary | ICD-10-CM | POA: Diagnosis not present

## 2021-02-13 DIAGNOSIS — J9621 Acute and chronic respiratory failure with hypoxia: Secondary | ICD-10-CM | POA: Diagnosis not present

## 2021-02-13 DIAGNOSIS — J449 Chronic obstructive pulmonary disease, unspecified: Secondary | ICD-10-CM | POA: Diagnosis not present

## 2021-02-13 DIAGNOSIS — F32A Depression, unspecified: Secondary | ICD-10-CM | POA: Diagnosis not present

## 2021-02-13 DIAGNOSIS — F17210 Nicotine dependence, cigarettes, uncomplicated: Secondary | ICD-10-CM | POA: Diagnosis not present

## 2021-02-16 DIAGNOSIS — I10 Essential (primary) hypertension: Secondary | ICD-10-CM | POA: Diagnosis not present

## 2021-02-16 DIAGNOSIS — J9621 Acute and chronic respiratory failure with hypoxia: Secondary | ICD-10-CM | POA: Diagnosis not present

## 2021-02-16 DIAGNOSIS — J449 Chronic obstructive pulmonary disease, unspecified: Secondary | ICD-10-CM | POA: Diagnosis not present

## 2021-02-16 DIAGNOSIS — F32A Depression, unspecified: Secondary | ICD-10-CM | POA: Diagnosis not present

## 2021-02-16 DIAGNOSIS — F17210 Nicotine dependence, cigarettes, uncomplicated: Secondary | ICD-10-CM | POA: Diagnosis not present

## 2021-02-18 DIAGNOSIS — I10 Essential (primary) hypertension: Secondary | ICD-10-CM | POA: Diagnosis not present

## 2021-02-18 DIAGNOSIS — J9621 Acute and chronic respiratory failure with hypoxia: Secondary | ICD-10-CM | POA: Diagnosis not present

## 2021-02-18 DIAGNOSIS — J449 Chronic obstructive pulmonary disease, unspecified: Secondary | ICD-10-CM | POA: Diagnosis not present

## 2021-02-18 DIAGNOSIS — F32A Depression, unspecified: Secondary | ICD-10-CM | POA: Diagnosis not present

## 2021-02-18 DIAGNOSIS — F17210 Nicotine dependence, cigarettes, uncomplicated: Secondary | ICD-10-CM | POA: Diagnosis not present

## 2021-02-19 DIAGNOSIS — F17210 Nicotine dependence, cigarettes, uncomplicated: Secondary | ICD-10-CM | POA: Diagnosis not present

## 2021-02-19 DIAGNOSIS — J449 Chronic obstructive pulmonary disease, unspecified: Secondary | ICD-10-CM | POA: Diagnosis not present

## 2021-02-19 DIAGNOSIS — J9621 Acute and chronic respiratory failure with hypoxia: Secondary | ICD-10-CM | POA: Diagnosis not present

## 2021-02-19 DIAGNOSIS — I10 Essential (primary) hypertension: Secondary | ICD-10-CM | POA: Diagnosis not present

## 2021-02-19 DIAGNOSIS — F32A Depression, unspecified: Secondary | ICD-10-CM | POA: Diagnosis not present

## 2021-02-20 DIAGNOSIS — F17210 Nicotine dependence, cigarettes, uncomplicated: Secondary | ICD-10-CM | POA: Diagnosis not present

## 2021-02-20 DIAGNOSIS — J9621 Acute and chronic respiratory failure with hypoxia: Secondary | ICD-10-CM | POA: Diagnosis not present

## 2021-02-20 DIAGNOSIS — I10 Essential (primary) hypertension: Secondary | ICD-10-CM | POA: Diagnosis not present

## 2021-02-20 DIAGNOSIS — J449 Chronic obstructive pulmonary disease, unspecified: Secondary | ICD-10-CM | POA: Diagnosis not present

## 2021-02-20 DIAGNOSIS — F32A Depression, unspecified: Secondary | ICD-10-CM | POA: Diagnosis not present

## 2021-02-23 DIAGNOSIS — J449 Chronic obstructive pulmonary disease, unspecified: Secondary | ICD-10-CM | POA: Diagnosis not present

## 2021-02-23 DIAGNOSIS — I10 Essential (primary) hypertension: Secondary | ICD-10-CM | POA: Diagnosis not present

## 2021-02-23 DIAGNOSIS — F32A Depression, unspecified: Secondary | ICD-10-CM | POA: Diagnosis not present

## 2021-02-23 DIAGNOSIS — F17210 Nicotine dependence, cigarettes, uncomplicated: Secondary | ICD-10-CM | POA: Diagnosis not present

## 2021-02-23 DIAGNOSIS — J9621 Acute and chronic respiratory failure with hypoxia: Secondary | ICD-10-CM | POA: Diagnosis not present

## 2021-02-25 DIAGNOSIS — Z741 Need for assistance with personal care: Secondary | ICD-10-CM | POA: Diagnosis not present

## 2021-02-25 DIAGNOSIS — Z9981 Dependence on supplemental oxygen: Secondary | ICD-10-CM | POA: Diagnosis not present

## 2021-02-25 DIAGNOSIS — R159 Full incontinence of feces: Secondary | ICD-10-CM | POA: Diagnosis not present

## 2021-02-25 DIAGNOSIS — E1142 Type 2 diabetes mellitus with diabetic polyneuropathy: Secondary | ICD-10-CM | POA: Diagnosis not present

## 2021-02-25 DIAGNOSIS — S4291XS Fracture of right shoulder girdle, part unspecified, sequela: Secondary | ICD-10-CM | POA: Diagnosis not present

## 2021-02-25 DIAGNOSIS — F17210 Nicotine dependence, cigarettes, uncomplicated: Secondary | ICD-10-CM | POA: Diagnosis not present

## 2021-02-25 DIAGNOSIS — K5900 Constipation, unspecified: Secondary | ICD-10-CM | POA: Diagnosis not present

## 2021-02-25 DIAGNOSIS — Z6841 Body Mass Index (BMI) 40.0 and over, adult: Secondary | ICD-10-CM | POA: Diagnosis not present

## 2021-02-25 DIAGNOSIS — J449 Chronic obstructive pulmonary disease, unspecified: Secondary | ICD-10-CM | POA: Diagnosis not present

## 2021-02-25 DIAGNOSIS — R251 Tremor, unspecified: Secondary | ICD-10-CM | POA: Diagnosis not present

## 2021-02-25 DIAGNOSIS — H409 Unspecified glaucoma: Secondary | ICD-10-CM | POA: Diagnosis not present

## 2021-02-25 DIAGNOSIS — F32A Depression, unspecified: Secondary | ICD-10-CM | POA: Diagnosis not present

## 2021-02-25 DIAGNOSIS — Z794 Long term (current) use of insulin: Secondary | ICD-10-CM | POA: Diagnosis not present

## 2021-02-25 DIAGNOSIS — I10 Essential (primary) hypertension: Secondary | ICD-10-CM | POA: Diagnosis not present

## 2021-02-25 DIAGNOSIS — R32 Unspecified urinary incontinence: Secondary | ICD-10-CM | POA: Diagnosis not present

## 2021-02-25 DIAGNOSIS — J9621 Acute and chronic respiratory failure with hypoxia: Secondary | ICD-10-CM | POA: Diagnosis not present

## 2021-02-25 DIAGNOSIS — F259 Schizoaffective disorder, unspecified: Secondary | ICD-10-CM | POA: Diagnosis not present

## 2021-02-26 DIAGNOSIS — E1142 Type 2 diabetes mellitus with diabetic polyneuropathy: Secondary | ICD-10-CM | POA: Diagnosis not present

## 2021-02-26 DIAGNOSIS — F17210 Nicotine dependence, cigarettes, uncomplicated: Secondary | ICD-10-CM | POA: Diagnosis not present

## 2021-02-26 DIAGNOSIS — J9621 Acute and chronic respiratory failure with hypoxia: Secondary | ICD-10-CM | POA: Diagnosis not present

## 2021-02-26 DIAGNOSIS — F32A Depression, unspecified: Secondary | ICD-10-CM | POA: Diagnosis not present

## 2021-02-26 DIAGNOSIS — J449 Chronic obstructive pulmonary disease, unspecified: Secondary | ICD-10-CM | POA: Diagnosis not present

## 2021-02-27 DIAGNOSIS — E1142 Type 2 diabetes mellitus with diabetic polyneuropathy: Secondary | ICD-10-CM | POA: Diagnosis not present

## 2021-02-27 DIAGNOSIS — J9621 Acute and chronic respiratory failure with hypoxia: Secondary | ICD-10-CM | POA: Diagnosis not present

## 2021-02-27 DIAGNOSIS — F17210 Nicotine dependence, cigarettes, uncomplicated: Secondary | ICD-10-CM | POA: Diagnosis not present

## 2021-02-27 DIAGNOSIS — J449 Chronic obstructive pulmonary disease, unspecified: Secondary | ICD-10-CM | POA: Diagnosis not present

## 2021-02-27 DIAGNOSIS — F32A Depression, unspecified: Secondary | ICD-10-CM | POA: Diagnosis not present

## 2021-03-02 DIAGNOSIS — F17210 Nicotine dependence, cigarettes, uncomplicated: Secondary | ICD-10-CM | POA: Diagnosis not present

## 2021-03-02 DIAGNOSIS — J449 Chronic obstructive pulmonary disease, unspecified: Secondary | ICD-10-CM | POA: Diagnosis not present

## 2021-03-02 DIAGNOSIS — F32A Depression, unspecified: Secondary | ICD-10-CM | POA: Diagnosis not present

## 2021-03-02 DIAGNOSIS — J9621 Acute and chronic respiratory failure with hypoxia: Secondary | ICD-10-CM | POA: Diagnosis not present

## 2021-03-02 DIAGNOSIS — E1142 Type 2 diabetes mellitus with diabetic polyneuropathy: Secondary | ICD-10-CM | POA: Diagnosis not present

## 2021-03-04 DIAGNOSIS — J9621 Acute and chronic respiratory failure with hypoxia: Secondary | ICD-10-CM | POA: Diagnosis not present

## 2021-03-04 DIAGNOSIS — E1142 Type 2 diabetes mellitus with diabetic polyneuropathy: Secondary | ICD-10-CM | POA: Diagnosis not present

## 2021-03-04 DIAGNOSIS — J449 Chronic obstructive pulmonary disease, unspecified: Secondary | ICD-10-CM | POA: Diagnosis not present

## 2021-03-04 DIAGNOSIS — F17210 Nicotine dependence, cigarettes, uncomplicated: Secondary | ICD-10-CM | POA: Diagnosis not present

## 2021-03-04 DIAGNOSIS — F32A Depression, unspecified: Secondary | ICD-10-CM | POA: Diagnosis not present

## 2021-03-05 DIAGNOSIS — E1142 Type 2 diabetes mellitus with diabetic polyneuropathy: Secondary | ICD-10-CM | POA: Diagnosis not present

## 2021-03-05 DIAGNOSIS — F17210 Nicotine dependence, cigarettes, uncomplicated: Secondary | ICD-10-CM | POA: Diagnosis not present

## 2021-03-05 DIAGNOSIS — J9621 Acute and chronic respiratory failure with hypoxia: Secondary | ICD-10-CM | POA: Diagnosis not present

## 2021-03-05 DIAGNOSIS — J449 Chronic obstructive pulmonary disease, unspecified: Secondary | ICD-10-CM | POA: Diagnosis not present

## 2021-03-05 DIAGNOSIS — F32A Depression, unspecified: Secondary | ICD-10-CM | POA: Diagnosis not present

## 2021-03-06 DIAGNOSIS — Z794 Long term (current) use of insulin: Secondary | ICD-10-CM | POA: Diagnosis not present

## 2021-03-06 DIAGNOSIS — F32A Depression, unspecified: Secondary | ICD-10-CM | POA: Diagnosis not present

## 2021-03-06 DIAGNOSIS — F259 Schizoaffective disorder, unspecified: Secondary | ICD-10-CM | POA: Diagnosis not present

## 2021-03-06 DIAGNOSIS — Z89421 Acquired absence of other right toe(s): Secondary | ICD-10-CM | POA: Diagnosis not present

## 2021-03-06 DIAGNOSIS — J9621 Acute and chronic respiratory failure with hypoxia: Secondary | ICD-10-CM | POA: Diagnosis not present

## 2021-03-06 DIAGNOSIS — F17210 Nicotine dependence, cigarettes, uncomplicated: Secondary | ICD-10-CM | POA: Diagnosis not present

## 2021-03-06 DIAGNOSIS — J449 Chronic obstructive pulmonary disease, unspecified: Secondary | ICD-10-CM | POA: Diagnosis not present

## 2021-03-06 DIAGNOSIS — E1165 Type 2 diabetes mellitus with hyperglycemia: Secondary | ICD-10-CM | POA: Diagnosis not present

## 2021-03-06 DIAGNOSIS — E1142 Type 2 diabetes mellitus with diabetic polyneuropathy: Secondary | ICD-10-CM | POA: Diagnosis not present

## 2021-03-09 DIAGNOSIS — F32A Depression, unspecified: Secondary | ICD-10-CM | POA: Diagnosis not present

## 2021-03-09 DIAGNOSIS — J9621 Acute and chronic respiratory failure with hypoxia: Secondary | ICD-10-CM | POA: Diagnosis not present

## 2021-03-09 DIAGNOSIS — E1142 Type 2 diabetes mellitus with diabetic polyneuropathy: Secondary | ICD-10-CM | POA: Diagnosis not present

## 2021-03-09 DIAGNOSIS — J449 Chronic obstructive pulmonary disease, unspecified: Secondary | ICD-10-CM | POA: Diagnosis not present

## 2021-03-09 DIAGNOSIS — F17210 Nicotine dependence, cigarettes, uncomplicated: Secondary | ICD-10-CM | POA: Diagnosis not present

## 2021-03-11 DIAGNOSIS — J449 Chronic obstructive pulmonary disease, unspecified: Secondary | ICD-10-CM | POA: Diagnosis not present

## 2021-03-11 DIAGNOSIS — F32A Depression, unspecified: Secondary | ICD-10-CM | POA: Diagnosis not present

## 2021-03-11 DIAGNOSIS — J9621 Acute and chronic respiratory failure with hypoxia: Secondary | ICD-10-CM | POA: Diagnosis not present

## 2021-03-11 DIAGNOSIS — E1142 Type 2 diabetes mellitus with diabetic polyneuropathy: Secondary | ICD-10-CM | POA: Diagnosis not present

## 2021-03-11 DIAGNOSIS — F17210 Nicotine dependence, cigarettes, uncomplicated: Secondary | ICD-10-CM | POA: Diagnosis not present

## 2021-03-12 DIAGNOSIS — E1142 Type 2 diabetes mellitus with diabetic polyneuropathy: Secondary | ICD-10-CM | POA: Diagnosis not present

## 2021-03-12 DIAGNOSIS — J449 Chronic obstructive pulmonary disease, unspecified: Secondary | ICD-10-CM | POA: Diagnosis not present

## 2021-03-12 DIAGNOSIS — J9621 Acute and chronic respiratory failure with hypoxia: Secondary | ICD-10-CM | POA: Diagnosis not present

## 2021-03-12 DIAGNOSIS — F17210 Nicotine dependence, cigarettes, uncomplicated: Secondary | ICD-10-CM | POA: Diagnosis not present

## 2021-03-12 DIAGNOSIS — F32A Depression, unspecified: Secondary | ICD-10-CM | POA: Diagnosis not present

## 2021-03-13 DIAGNOSIS — J9621 Acute and chronic respiratory failure with hypoxia: Secondary | ICD-10-CM | POA: Diagnosis not present

## 2021-03-13 DIAGNOSIS — E1142 Type 2 diabetes mellitus with diabetic polyneuropathy: Secondary | ICD-10-CM | POA: Diagnosis not present

## 2021-03-13 DIAGNOSIS — F32A Depression, unspecified: Secondary | ICD-10-CM | POA: Diagnosis not present

## 2021-03-13 DIAGNOSIS — F17210 Nicotine dependence, cigarettes, uncomplicated: Secondary | ICD-10-CM | POA: Diagnosis not present

## 2021-03-13 DIAGNOSIS — J449 Chronic obstructive pulmonary disease, unspecified: Secondary | ICD-10-CM | POA: Diagnosis not present

## 2021-03-16 DIAGNOSIS — E1142 Type 2 diabetes mellitus with diabetic polyneuropathy: Secondary | ICD-10-CM | POA: Diagnosis not present

## 2021-03-16 DIAGNOSIS — J449 Chronic obstructive pulmonary disease, unspecified: Secondary | ICD-10-CM | POA: Diagnosis not present

## 2021-03-16 DIAGNOSIS — F17210 Nicotine dependence, cigarettes, uncomplicated: Secondary | ICD-10-CM | POA: Diagnosis not present

## 2021-03-16 DIAGNOSIS — F32A Depression, unspecified: Secondary | ICD-10-CM | POA: Diagnosis not present

## 2021-03-16 DIAGNOSIS — J9621 Acute and chronic respiratory failure with hypoxia: Secondary | ICD-10-CM | POA: Diagnosis not present

## 2021-03-18 DIAGNOSIS — J449 Chronic obstructive pulmonary disease, unspecified: Secondary | ICD-10-CM | POA: Diagnosis not present

## 2021-03-18 DIAGNOSIS — J9621 Acute and chronic respiratory failure with hypoxia: Secondary | ICD-10-CM | POA: Diagnosis not present

## 2021-03-18 DIAGNOSIS — E1142 Type 2 diabetes mellitus with diabetic polyneuropathy: Secondary | ICD-10-CM | POA: Diagnosis not present

## 2021-03-18 DIAGNOSIS — F32A Depression, unspecified: Secondary | ICD-10-CM | POA: Diagnosis not present

## 2021-03-18 DIAGNOSIS — F17210 Nicotine dependence, cigarettes, uncomplicated: Secondary | ICD-10-CM | POA: Diagnosis not present

## 2021-03-20 DIAGNOSIS — E1142 Type 2 diabetes mellitus with diabetic polyneuropathy: Secondary | ICD-10-CM | POA: Diagnosis not present

## 2021-03-20 DIAGNOSIS — J9621 Acute and chronic respiratory failure with hypoxia: Secondary | ICD-10-CM | POA: Diagnosis not present

## 2021-03-20 DIAGNOSIS — J449 Chronic obstructive pulmonary disease, unspecified: Secondary | ICD-10-CM | POA: Diagnosis not present

## 2021-03-20 DIAGNOSIS — F17210 Nicotine dependence, cigarettes, uncomplicated: Secondary | ICD-10-CM | POA: Diagnosis not present

## 2021-03-20 DIAGNOSIS — F32A Depression, unspecified: Secondary | ICD-10-CM | POA: Diagnosis not present

## 2021-03-23 DIAGNOSIS — E1142 Type 2 diabetes mellitus with diabetic polyneuropathy: Secondary | ICD-10-CM | POA: Diagnosis not present

## 2021-03-23 DIAGNOSIS — J449 Chronic obstructive pulmonary disease, unspecified: Secondary | ICD-10-CM | POA: Diagnosis not present

## 2021-03-23 DIAGNOSIS — F17210 Nicotine dependence, cigarettes, uncomplicated: Secondary | ICD-10-CM | POA: Diagnosis not present

## 2021-03-23 DIAGNOSIS — J9621 Acute and chronic respiratory failure with hypoxia: Secondary | ICD-10-CM | POA: Diagnosis not present

## 2021-03-23 DIAGNOSIS — F32A Depression, unspecified: Secondary | ICD-10-CM | POA: Diagnosis not present

## 2021-03-25 DIAGNOSIS — E1142 Type 2 diabetes mellitus with diabetic polyneuropathy: Secondary | ICD-10-CM | POA: Diagnosis not present

## 2021-03-25 DIAGNOSIS — F259 Schizoaffective disorder, unspecified: Secondary | ICD-10-CM | POA: Diagnosis not present

## 2021-03-25 DIAGNOSIS — Z6841 Body Mass Index (BMI) 40.0 and over, adult: Secondary | ICD-10-CM | POA: Diagnosis not present

## 2021-03-25 DIAGNOSIS — R251 Tremor, unspecified: Secondary | ICD-10-CM | POA: Diagnosis not present

## 2021-03-25 DIAGNOSIS — Z794 Long term (current) use of insulin: Secondary | ICD-10-CM | POA: Diagnosis not present

## 2021-03-25 DIAGNOSIS — Z741 Need for assistance with personal care: Secondary | ICD-10-CM | POA: Diagnosis not present

## 2021-03-25 DIAGNOSIS — F32A Depression, unspecified: Secondary | ICD-10-CM | POA: Diagnosis not present

## 2021-03-25 DIAGNOSIS — I10 Essential (primary) hypertension: Secondary | ICD-10-CM | POA: Diagnosis not present

## 2021-03-25 DIAGNOSIS — S4291XS Fracture of right shoulder girdle, part unspecified, sequela: Secondary | ICD-10-CM | POA: Diagnosis not present

## 2021-03-25 DIAGNOSIS — J449 Chronic obstructive pulmonary disease, unspecified: Secondary | ICD-10-CM | POA: Diagnosis not present

## 2021-03-25 DIAGNOSIS — K5900 Constipation, unspecified: Secondary | ICD-10-CM | POA: Diagnosis not present

## 2021-03-25 DIAGNOSIS — Z9981 Dependence on supplemental oxygen: Secondary | ICD-10-CM | POA: Diagnosis not present

## 2021-03-25 DIAGNOSIS — H409 Unspecified glaucoma: Secondary | ICD-10-CM | POA: Diagnosis not present

## 2021-03-25 DIAGNOSIS — J9621 Acute and chronic respiratory failure with hypoxia: Secondary | ICD-10-CM | POA: Diagnosis not present

## 2021-03-25 DIAGNOSIS — R32 Unspecified urinary incontinence: Secondary | ICD-10-CM | POA: Diagnosis not present

## 2021-03-25 DIAGNOSIS — F17210 Nicotine dependence, cigarettes, uncomplicated: Secondary | ICD-10-CM | POA: Diagnosis not present

## 2021-03-25 DIAGNOSIS — R159 Full incontinence of feces: Secondary | ICD-10-CM | POA: Diagnosis not present

## 2021-03-26 DIAGNOSIS — F17210 Nicotine dependence, cigarettes, uncomplicated: Secondary | ICD-10-CM | POA: Diagnosis not present

## 2021-03-26 DIAGNOSIS — J449 Chronic obstructive pulmonary disease, unspecified: Secondary | ICD-10-CM | POA: Diagnosis not present

## 2021-03-26 DIAGNOSIS — E1142 Type 2 diabetes mellitus with diabetic polyneuropathy: Secondary | ICD-10-CM | POA: Diagnosis not present

## 2021-03-26 DIAGNOSIS — F32A Depression, unspecified: Secondary | ICD-10-CM | POA: Diagnosis not present

## 2021-03-26 DIAGNOSIS — J9621 Acute and chronic respiratory failure with hypoxia: Secondary | ICD-10-CM | POA: Diagnosis not present

## 2021-03-27 DIAGNOSIS — F17210 Nicotine dependence, cigarettes, uncomplicated: Secondary | ICD-10-CM | POA: Diagnosis not present

## 2021-03-27 DIAGNOSIS — E1142 Type 2 diabetes mellitus with diabetic polyneuropathy: Secondary | ICD-10-CM | POA: Diagnosis not present

## 2021-03-27 DIAGNOSIS — J449 Chronic obstructive pulmonary disease, unspecified: Secondary | ICD-10-CM | POA: Diagnosis not present

## 2021-03-27 DIAGNOSIS — F32A Depression, unspecified: Secondary | ICD-10-CM | POA: Diagnosis not present

## 2021-03-27 DIAGNOSIS — J9621 Acute and chronic respiratory failure with hypoxia: Secondary | ICD-10-CM | POA: Diagnosis not present

## 2021-03-30 DIAGNOSIS — F32A Depression, unspecified: Secondary | ICD-10-CM | POA: Diagnosis not present

## 2021-03-30 DIAGNOSIS — F17210 Nicotine dependence, cigarettes, uncomplicated: Secondary | ICD-10-CM | POA: Diagnosis not present

## 2021-03-30 DIAGNOSIS — E1142 Type 2 diabetes mellitus with diabetic polyneuropathy: Secondary | ICD-10-CM | POA: Diagnosis not present

## 2021-03-30 DIAGNOSIS — F259 Schizoaffective disorder, unspecified: Secondary | ICD-10-CM | POA: Diagnosis not present

## 2021-03-30 DIAGNOSIS — J449 Chronic obstructive pulmonary disease, unspecified: Secondary | ICD-10-CM | POA: Diagnosis not present

## 2021-03-30 DIAGNOSIS — J9621 Acute and chronic respiratory failure with hypoxia: Secondary | ICD-10-CM | POA: Diagnosis not present

## 2021-04-01 DIAGNOSIS — F17210 Nicotine dependence, cigarettes, uncomplicated: Secondary | ICD-10-CM | POA: Diagnosis not present

## 2021-04-01 DIAGNOSIS — E1142 Type 2 diabetes mellitus with diabetic polyneuropathy: Secondary | ICD-10-CM | POA: Diagnosis not present

## 2021-04-01 DIAGNOSIS — F32A Depression, unspecified: Secondary | ICD-10-CM | POA: Diagnosis not present

## 2021-04-01 DIAGNOSIS — J449 Chronic obstructive pulmonary disease, unspecified: Secondary | ICD-10-CM | POA: Diagnosis not present

## 2021-04-01 DIAGNOSIS — J9621 Acute and chronic respiratory failure with hypoxia: Secondary | ICD-10-CM | POA: Diagnosis not present

## 2021-04-02 DIAGNOSIS — J449 Chronic obstructive pulmonary disease, unspecified: Secondary | ICD-10-CM | POA: Diagnosis not present

## 2021-04-02 DIAGNOSIS — F17210 Nicotine dependence, cigarettes, uncomplicated: Secondary | ICD-10-CM | POA: Diagnosis not present

## 2021-04-02 DIAGNOSIS — F32A Depression, unspecified: Secondary | ICD-10-CM | POA: Diagnosis not present

## 2021-04-02 DIAGNOSIS — E1142 Type 2 diabetes mellitus with diabetic polyneuropathy: Secondary | ICD-10-CM | POA: Diagnosis not present

## 2021-04-02 DIAGNOSIS — J9621 Acute and chronic respiratory failure with hypoxia: Secondary | ICD-10-CM | POA: Diagnosis not present

## 2021-04-03 DIAGNOSIS — J449 Chronic obstructive pulmonary disease, unspecified: Secondary | ICD-10-CM | POA: Diagnosis not present

## 2021-04-03 DIAGNOSIS — J9621 Acute and chronic respiratory failure with hypoxia: Secondary | ICD-10-CM | POA: Diagnosis not present

## 2021-04-03 DIAGNOSIS — F32A Depression, unspecified: Secondary | ICD-10-CM | POA: Diagnosis not present

## 2021-04-03 DIAGNOSIS — F17210 Nicotine dependence, cigarettes, uncomplicated: Secondary | ICD-10-CM | POA: Diagnosis not present

## 2021-04-03 DIAGNOSIS — E1142 Type 2 diabetes mellitus with diabetic polyneuropathy: Secondary | ICD-10-CM | POA: Diagnosis not present

## 2021-04-06 DIAGNOSIS — F32A Depression, unspecified: Secondary | ICD-10-CM | POA: Diagnosis not present

## 2021-04-06 DIAGNOSIS — J9621 Acute and chronic respiratory failure with hypoxia: Secondary | ICD-10-CM | POA: Diagnosis not present

## 2021-04-06 DIAGNOSIS — F17210 Nicotine dependence, cigarettes, uncomplicated: Secondary | ICD-10-CM | POA: Diagnosis not present

## 2021-04-06 DIAGNOSIS — J449 Chronic obstructive pulmonary disease, unspecified: Secondary | ICD-10-CM | POA: Diagnosis not present

## 2021-04-06 DIAGNOSIS — E1142 Type 2 diabetes mellitus with diabetic polyneuropathy: Secondary | ICD-10-CM | POA: Diagnosis not present

## 2021-04-08 DIAGNOSIS — J449 Chronic obstructive pulmonary disease, unspecified: Secondary | ICD-10-CM | POA: Diagnosis not present

## 2021-04-08 DIAGNOSIS — J9621 Acute and chronic respiratory failure with hypoxia: Secondary | ICD-10-CM | POA: Diagnosis not present

## 2021-04-08 DIAGNOSIS — F32A Depression, unspecified: Secondary | ICD-10-CM | POA: Diagnosis not present

## 2021-04-08 DIAGNOSIS — E1142 Type 2 diabetes mellitus with diabetic polyneuropathy: Secondary | ICD-10-CM | POA: Diagnosis not present

## 2021-04-08 DIAGNOSIS — F17210 Nicotine dependence, cigarettes, uncomplicated: Secondary | ICD-10-CM | POA: Diagnosis not present

## 2021-04-09 DIAGNOSIS — J9621 Acute and chronic respiratory failure with hypoxia: Secondary | ICD-10-CM | POA: Diagnosis not present

## 2021-04-09 DIAGNOSIS — E1142 Type 2 diabetes mellitus with diabetic polyneuropathy: Secondary | ICD-10-CM | POA: Diagnosis not present

## 2021-04-09 DIAGNOSIS — F17210 Nicotine dependence, cigarettes, uncomplicated: Secondary | ICD-10-CM | POA: Diagnosis not present

## 2021-04-09 DIAGNOSIS — J449 Chronic obstructive pulmonary disease, unspecified: Secondary | ICD-10-CM | POA: Diagnosis not present

## 2021-04-09 DIAGNOSIS — F32A Depression, unspecified: Secondary | ICD-10-CM | POA: Diagnosis not present

## 2021-04-10 DIAGNOSIS — F32A Depression, unspecified: Secondary | ICD-10-CM | POA: Diagnosis not present

## 2021-04-10 DIAGNOSIS — J449 Chronic obstructive pulmonary disease, unspecified: Secondary | ICD-10-CM | POA: Diagnosis not present

## 2021-04-10 DIAGNOSIS — F17210 Nicotine dependence, cigarettes, uncomplicated: Secondary | ICD-10-CM | POA: Diagnosis not present

## 2021-04-10 DIAGNOSIS — E1142 Type 2 diabetes mellitus with diabetic polyneuropathy: Secondary | ICD-10-CM | POA: Diagnosis not present

## 2021-04-10 DIAGNOSIS — J9621 Acute and chronic respiratory failure with hypoxia: Secondary | ICD-10-CM | POA: Diagnosis not present

## 2021-04-12 DIAGNOSIS — F411 Generalized anxiety disorder: Secondary | ICD-10-CM | POA: Diagnosis not present

## 2021-04-12 DIAGNOSIS — F259 Schizoaffective disorder, unspecified: Secondary | ICD-10-CM | POA: Diagnosis not present

## 2021-04-13 DIAGNOSIS — J449 Chronic obstructive pulmonary disease, unspecified: Secondary | ICD-10-CM | POA: Diagnosis not present

## 2021-04-13 DIAGNOSIS — F32A Depression, unspecified: Secondary | ICD-10-CM | POA: Diagnosis not present

## 2021-04-13 DIAGNOSIS — E1142 Type 2 diabetes mellitus with diabetic polyneuropathy: Secondary | ICD-10-CM | POA: Diagnosis not present

## 2021-04-13 DIAGNOSIS — F17210 Nicotine dependence, cigarettes, uncomplicated: Secondary | ICD-10-CM | POA: Diagnosis not present

## 2021-04-13 DIAGNOSIS — J9621 Acute and chronic respiratory failure with hypoxia: Secondary | ICD-10-CM | POA: Diagnosis not present

## 2021-04-15 DIAGNOSIS — E1142 Type 2 diabetes mellitus with diabetic polyneuropathy: Secondary | ICD-10-CM | POA: Diagnosis not present

## 2021-04-15 DIAGNOSIS — F32A Depression, unspecified: Secondary | ICD-10-CM | POA: Diagnosis not present

## 2021-04-15 DIAGNOSIS — J449 Chronic obstructive pulmonary disease, unspecified: Secondary | ICD-10-CM | POA: Diagnosis not present

## 2021-04-15 DIAGNOSIS — F17210 Nicotine dependence, cigarettes, uncomplicated: Secondary | ICD-10-CM | POA: Diagnosis not present

## 2021-04-15 DIAGNOSIS — J9621 Acute and chronic respiratory failure with hypoxia: Secondary | ICD-10-CM | POA: Diagnosis not present

## 2021-04-16 DIAGNOSIS — J9621 Acute and chronic respiratory failure with hypoxia: Secondary | ICD-10-CM | POA: Diagnosis not present

## 2021-04-16 DIAGNOSIS — F17210 Nicotine dependence, cigarettes, uncomplicated: Secondary | ICD-10-CM | POA: Diagnosis not present

## 2021-04-16 DIAGNOSIS — J449 Chronic obstructive pulmonary disease, unspecified: Secondary | ICD-10-CM | POA: Diagnosis not present

## 2021-04-16 DIAGNOSIS — E1142 Type 2 diabetes mellitus with diabetic polyneuropathy: Secondary | ICD-10-CM | POA: Diagnosis not present

## 2021-04-16 DIAGNOSIS — F32A Depression, unspecified: Secondary | ICD-10-CM | POA: Diagnosis not present

## 2021-04-17 DIAGNOSIS — F32A Depression, unspecified: Secondary | ICD-10-CM | POA: Diagnosis not present

## 2021-04-17 DIAGNOSIS — F17210 Nicotine dependence, cigarettes, uncomplicated: Secondary | ICD-10-CM | POA: Diagnosis not present

## 2021-04-17 DIAGNOSIS — J449 Chronic obstructive pulmonary disease, unspecified: Secondary | ICD-10-CM | POA: Diagnosis not present

## 2021-04-17 DIAGNOSIS — E1142 Type 2 diabetes mellitus with diabetic polyneuropathy: Secondary | ICD-10-CM | POA: Diagnosis not present

## 2021-04-17 DIAGNOSIS — J9621 Acute and chronic respiratory failure with hypoxia: Secondary | ICD-10-CM | POA: Diagnosis not present

## 2021-04-20 DIAGNOSIS — F411 Generalized anxiety disorder: Secondary | ICD-10-CM | POA: Diagnosis not present

## 2021-04-20 DIAGNOSIS — F259 Schizoaffective disorder, unspecified: Secondary | ICD-10-CM | POA: Diagnosis not present

## 2021-04-20 DIAGNOSIS — J9621 Acute and chronic respiratory failure with hypoxia: Secondary | ICD-10-CM | POA: Diagnosis not present

## 2021-04-20 DIAGNOSIS — F17210 Nicotine dependence, cigarettes, uncomplicated: Secondary | ICD-10-CM | POA: Diagnosis not present

## 2021-04-20 DIAGNOSIS — E1142 Type 2 diabetes mellitus with diabetic polyneuropathy: Secondary | ICD-10-CM | POA: Diagnosis not present

## 2021-04-20 DIAGNOSIS — J449 Chronic obstructive pulmonary disease, unspecified: Secondary | ICD-10-CM | POA: Diagnosis not present

## 2021-04-20 DIAGNOSIS — F32A Depression, unspecified: Secondary | ICD-10-CM | POA: Diagnosis not present

## 2021-04-22 DIAGNOSIS — F17210 Nicotine dependence, cigarettes, uncomplicated: Secondary | ICD-10-CM | POA: Diagnosis not present

## 2021-04-22 DIAGNOSIS — F32A Depression, unspecified: Secondary | ICD-10-CM | POA: Diagnosis not present

## 2021-04-22 DIAGNOSIS — E1142 Type 2 diabetes mellitus with diabetic polyneuropathy: Secondary | ICD-10-CM | POA: Diagnosis not present

## 2021-04-22 DIAGNOSIS — J449 Chronic obstructive pulmonary disease, unspecified: Secondary | ICD-10-CM | POA: Diagnosis not present

## 2021-04-22 DIAGNOSIS — J9621 Acute and chronic respiratory failure with hypoxia: Secondary | ICD-10-CM | POA: Diagnosis not present

## 2021-04-23 DIAGNOSIS — F17210 Nicotine dependence, cigarettes, uncomplicated: Secondary | ICD-10-CM | POA: Diagnosis not present

## 2021-04-23 DIAGNOSIS — J449 Chronic obstructive pulmonary disease, unspecified: Secondary | ICD-10-CM | POA: Diagnosis not present

## 2021-04-23 DIAGNOSIS — E1142 Type 2 diabetes mellitus with diabetic polyneuropathy: Secondary | ICD-10-CM | POA: Diagnosis not present

## 2021-04-23 DIAGNOSIS — J9621 Acute and chronic respiratory failure with hypoxia: Secondary | ICD-10-CM | POA: Diagnosis not present

## 2021-04-23 DIAGNOSIS — F32A Depression, unspecified: Secondary | ICD-10-CM | POA: Diagnosis not present

## 2021-04-24 DIAGNOSIS — F17210 Nicotine dependence, cigarettes, uncomplicated: Secondary | ICD-10-CM | POA: Diagnosis not present

## 2021-04-24 DIAGNOSIS — J449 Chronic obstructive pulmonary disease, unspecified: Secondary | ICD-10-CM | POA: Diagnosis not present

## 2021-04-24 DIAGNOSIS — E1142 Type 2 diabetes mellitus with diabetic polyneuropathy: Secondary | ICD-10-CM | POA: Diagnosis not present

## 2021-04-24 DIAGNOSIS — F32A Depression, unspecified: Secondary | ICD-10-CM | POA: Diagnosis not present

## 2021-04-24 DIAGNOSIS — J9621 Acute and chronic respiratory failure with hypoxia: Secondary | ICD-10-CM | POA: Diagnosis not present

## 2021-04-25 DIAGNOSIS — I10 Essential (primary) hypertension: Secondary | ICD-10-CM | POA: Diagnosis not present

## 2021-04-25 DIAGNOSIS — F259 Schizoaffective disorder, unspecified: Secondary | ICD-10-CM | POA: Diagnosis not present

## 2021-04-25 DIAGNOSIS — J449 Chronic obstructive pulmonary disease, unspecified: Secondary | ICD-10-CM | POA: Diagnosis not present

## 2021-04-25 DIAGNOSIS — F32A Depression, unspecified: Secondary | ICD-10-CM | POA: Diagnosis not present

## 2021-04-25 DIAGNOSIS — Z6841 Body Mass Index (BMI) 40.0 and over, adult: Secondary | ICD-10-CM | POA: Diagnosis not present

## 2021-04-25 DIAGNOSIS — R251 Tremor, unspecified: Secondary | ICD-10-CM | POA: Diagnosis not present

## 2021-04-25 DIAGNOSIS — K5900 Constipation, unspecified: Secondary | ICD-10-CM | POA: Diagnosis not present

## 2021-04-25 DIAGNOSIS — E1142 Type 2 diabetes mellitus with diabetic polyneuropathy: Secondary | ICD-10-CM | POA: Diagnosis not present

## 2021-04-25 DIAGNOSIS — F17210 Nicotine dependence, cigarettes, uncomplicated: Secondary | ICD-10-CM | POA: Diagnosis not present

## 2021-04-25 DIAGNOSIS — J9621 Acute and chronic respiratory failure with hypoxia: Secondary | ICD-10-CM | POA: Diagnosis not present

## 2021-04-25 DIAGNOSIS — Z794 Long term (current) use of insulin: Secondary | ICD-10-CM | POA: Diagnosis not present

## 2021-04-25 DIAGNOSIS — S4291XS Fracture of right shoulder girdle, part unspecified, sequela: Secondary | ICD-10-CM | POA: Diagnosis not present

## 2021-04-25 DIAGNOSIS — H409 Unspecified glaucoma: Secondary | ICD-10-CM | POA: Diagnosis not present

## 2021-04-25 DIAGNOSIS — Z9981 Dependence on supplemental oxygen: Secondary | ICD-10-CM | POA: Diagnosis not present

## 2021-04-25 DIAGNOSIS — R159 Full incontinence of feces: Secondary | ICD-10-CM | POA: Diagnosis not present

## 2021-04-25 DIAGNOSIS — R32 Unspecified urinary incontinence: Secondary | ICD-10-CM | POA: Diagnosis not present

## 2021-04-25 DIAGNOSIS — Z741 Need for assistance with personal care: Secondary | ICD-10-CM | POA: Diagnosis not present

## 2021-04-27 DIAGNOSIS — J449 Chronic obstructive pulmonary disease, unspecified: Secondary | ICD-10-CM | POA: Diagnosis not present

## 2021-04-27 DIAGNOSIS — F32A Depression, unspecified: Secondary | ICD-10-CM | POA: Diagnosis not present

## 2021-04-27 DIAGNOSIS — J9621 Acute and chronic respiratory failure with hypoxia: Secondary | ICD-10-CM | POA: Diagnosis not present

## 2021-04-27 DIAGNOSIS — E1142 Type 2 diabetes mellitus with diabetic polyneuropathy: Secondary | ICD-10-CM | POA: Diagnosis not present

## 2021-04-27 DIAGNOSIS — F17210 Nicotine dependence, cigarettes, uncomplicated: Secondary | ICD-10-CM | POA: Diagnosis not present

## 2021-04-28 DIAGNOSIS — E1142 Type 2 diabetes mellitus with diabetic polyneuropathy: Secondary | ICD-10-CM | POA: Diagnosis not present

## 2021-04-28 DIAGNOSIS — J449 Chronic obstructive pulmonary disease, unspecified: Secondary | ICD-10-CM | POA: Diagnosis not present

## 2021-04-28 DIAGNOSIS — J9621 Acute and chronic respiratory failure with hypoxia: Secondary | ICD-10-CM | POA: Diagnosis not present

## 2021-04-28 DIAGNOSIS — F32A Depression, unspecified: Secondary | ICD-10-CM | POA: Diagnosis not present

## 2021-04-28 DIAGNOSIS — F17210 Nicotine dependence, cigarettes, uncomplicated: Secondary | ICD-10-CM | POA: Diagnosis not present

## 2021-04-29 DIAGNOSIS — E1142 Type 2 diabetes mellitus with diabetic polyneuropathy: Secondary | ICD-10-CM | POA: Diagnosis not present

## 2021-04-29 DIAGNOSIS — J449 Chronic obstructive pulmonary disease, unspecified: Secondary | ICD-10-CM | POA: Diagnosis not present

## 2021-04-29 DIAGNOSIS — J9621 Acute and chronic respiratory failure with hypoxia: Secondary | ICD-10-CM | POA: Diagnosis not present

## 2021-04-29 DIAGNOSIS — F17210 Nicotine dependence, cigarettes, uncomplicated: Secondary | ICD-10-CM | POA: Diagnosis not present

## 2021-04-29 DIAGNOSIS — F32A Depression, unspecified: Secondary | ICD-10-CM | POA: Diagnosis not present

## 2021-04-30 DIAGNOSIS — F32A Depression, unspecified: Secondary | ICD-10-CM | POA: Diagnosis not present

## 2021-04-30 DIAGNOSIS — J449 Chronic obstructive pulmonary disease, unspecified: Secondary | ICD-10-CM | POA: Diagnosis not present

## 2021-04-30 DIAGNOSIS — J9621 Acute and chronic respiratory failure with hypoxia: Secondary | ICD-10-CM | POA: Diagnosis not present

## 2021-04-30 DIAGNOSIS — E1142 Type 2 diabetes mellitus with diabetic polyneuropathy: Secondary | ICD-10-CM | POA: Diagnosis not present

## 2021-04-30 DIAGNOSIS — F17210 Nicotine dependence, cigarettes, uncomplicated: Secondary | ICD-10-CM | POA: Diagnosis not present

## 2021-05-01 DIAGNOSIS — J9621 Acute and chronic respiratory failure with hypoxia: Secondary | ICD-10-CM | POA: Diagnosis not present

## 2021-05-01 DIAGNOSIS — E1142 Type 2 diabetes mellitus with diabetic polyneuropathy: Secondary | ICD-10-CM | POA: Diagnosis not present

## 2021-05-01 DIAGNOSIS — J449 Chronic obstructive pulmonary disease, unspecified: Secondary | ICD-10-CM | POA: Diagnosis not present

## 2021-05-01 DIAGNOSIS — F32A Depression, unspecified: Secondary | ICD-10-CM | POA: Diagnosis not present

## 2021-05-01 DIAGNOSIS — F17210 Nicotine dependence, cigarettes, uncomplicated: Secondary | ICD-10-CM | POA: Diagnosis not present

## 2021-05-04 DIAGNOSIS — J449 Chronic obstructive pulmonary disease, unspecified: Secondary | ICD-10-CM | POA: Diagnosis not present

## 2021-05-04 DIAGNOSIS — E1142 Type 2 diabetes mellitus with diabetic polyneuropathy: Secondary | ICD-10-CM | POA: Diagnosis not present

## 2021-05-04 DIAGNOSIS — F32A Depression, unspecified: Secondary | ICD-10-CM | POA: Diagnosis not present

## 2021-05-04 DIAGNOSIS — F17210 Nicotine dependence, cigarettes, uncomplicated: Secondary | ICD-10-CM | POA: Diagnosis not present

## 2021-05-04 DIAGNOSIS — J9621 Acute and chronic respiratory failure with hypoxia: Secondary | ICD-10-CM | POA: Diagnosis not present

## 2021-05-06 DIAGNOSIS — J449 Chronic obstructive pulmonary disease, unspecified: Secondary | ICD-10-CM | POA: Diagnosis not present

## 2021-05-06 DIAGNOSIS — F32A Depression, unspecified: Secondary | ICD-10-CM | POA: Diagnosis not present

## 2021-05-06 DIAGNOSIS — F17210 Nicotine dependence, cigarettes, uncomplicated: Secondary | ICD-10-CM | POA: Diagnosis not present

## 2021-05-06 DIAGNOSIS — J9621 Acute and chronic respiratory failure with hypoxia: Secondary | ICD-10-CM | POA: Diagnosis not present

## 2021-05-06 DIAGNOSIS — E1142 Type 2 diabetes mellitus with diabetic polyneuropathy: Secondary | ICD-10-CM | POA: Diagnosis not present

## 2021-05-07 DIAGNOSIS — E1142 Type 2 diabetes mellitus with diabetic polyneuropathy: Secondary | ICD-10-CM | POA: Diagnosis not present

## 2021-05-07 DIAGNOSIS — J9621 Acute and chronic respiratory failure with hypoxia: Secondary | ICD-10-CM | POA: Diagnosis not present

## 2021-05-07 DIAGNOSIS — F17210 Nicotine dependence, cigarettes, uncomplicated: Secondary | ICD-10-CM | POA: Diagnosis not present

## 2021-05-07 DIAGNOSIS — J449 Chronic obstructive pulmonary disease, unspecified: Secondary | ICD-10-CM | POA: Diagnosis not present

## 2021-05-07 DIAGNOSIS — F32A Depression, unspecified: Secondary | ICD-10-CM | POA: Diagnosis not present

## 2021-05-08 DIAGNOSIS — F32A Depression, unspecified: Secondary | ICD-10-CM | POA: Diagnosis not present

## 2021-05-08 DIAGNOSIS — F17210 Nicotine dependence, cigarettes, uncomplicated: Secondary | ICD-10-CM | POA: Diagnosis not present

## 2021-05-08 DIAGNOSIS — J9621 Acute and chronic respiratory failure with hypoxia: Secondary | ICD-10-CM | POA: Diagnosis not present

## 2021-05-08 DIAGNOSIS — J449 Chronic obstructive pulmonary disease, unspecified: Secondary | ICD-10-CM | POA: Diagnosis not present

## 2021-05-08 DIAGNOSIS — E1142 Type 2 diabetes mellitus with diabetic polyneuropathy: Secondary | ICD-10-CM | POA: Diagnosis not present

## 2021-05-11 DIAGNOSIS — F17210 Nicotine dependence, cigarettes, uncomplicated: Secondary | ICD-10-CM | POA: Diagnosis not present

## 2021-05-11 DIAGNOSIS — E1142 Type 2 diabetes mellitus with diabetic polyneuropathy: Secondary | ICD-10-CM | POA: Diagnosis not present

## 2021-05-11 DIAGNOSIS — J9621 Acute and chronic respiratory failure with hypoxia: Secondary | ICD-10-CM | POA: Diagnosis not present

## 2021-05-11 DIAGNOSIS — F32A Depression, unspecified: Secondary | ICD-10-CM | POA: Diagnosis not present

## 2021-05-11 DIAGNOSIS — J449 Chronic obstructive pulmonary disease, unspecified: Secondary | ICD-10-CM | POA: Diagnosis not present

## 2021-05-13 DIAGNOSIS — J449 Chronic obstructive pulmonary disease, unspecified: Secondary | ICD-10-CM | POA: Diagnosis not present

## 2021-05-13 DIAGNOSIS — F32A Depression, unspecified: Secondary | ICD-10-CM | POA: Diagnosis not present

## 2021-05-13 DIAGNOSIS — J9621 Acute and chronic respiratory failure with hypoxia: Secondary | ICD-10-CM | POA: Diagnosis not present

## 2021-05-13 DIAGNOSIS — E1142 Type 2 diabetes mellitus with diabetic polyneuropathy: Secondary | ICD-10-CM | POA: Diagnosis not present

## 2021-05-13 DIAGNOSIS — F17210 Nicotine dependence, cigarettes, uncomplicated: Secondary | ICD-10-CM | POA: Diagnosis not present

## 2021-05-14 DIAGNOSIS — F17210 Nicotine dependence, cigarettes, uncomplicated: Secondary | ICD-10-CM | POA: Diagnosis not present

## 2021-05-14 DIAGNOSIS — J9621 Acute and chronic respiratory failure with hypoxia: Secondary | ICD-10-CM | POA: Diagnosis not present

## 2021-05-14 DIAGNOSIS — J449 Chronic obstructive pulmonary disease, unspecified: Secondary | ICD-10-CM | POA: Diagnosis not present

## 2021-05-14 DIAGNOSIS — F32A Depression, unspecified: Secondary | ICD-10-CM | POA: Diagnosis not present

## 2021-05-14 DIAGNOSIS — E1142 Type 2 diabetes mellitus with diabetic polyneuropathy: Secondary | ICD-10-CM | POA: Diagnosis not present

## 2021-05-15 DIAGNOSIS — F32A Depression, unspecified: Secondary | ICD-10-CM | POA: Diagnosis not present

## 2021-05-15 DIAGNOSIS — J449 Chronic obstructive pulmonary disease, unspecified: Secondary | ICD-10-CM | POA: Diagnosis not present

## 2021-05-15 DIAGNOSIS — J9621 Acute and chronic respiratory failure with hypoxia: Secondary | ICD-10-CM | POA: Diagnosis not present

## 2021-05-15 DIAGNOSIS — F17210 Nicotine dependence, cigarettes, uncomplicated: Secondary | ICD-10-CM | POA: Diagnosis not present

## 2021-05-15 DIAGNOSIS — E1142 Type 2 diabetes mellitus with diabetic polyneuropathy: Secondary | ICD-10-CM | POA: Diagnosis not present

## 2021-05-18 DIAGNOSIS — F32A Depression, unspecified: Secondary | ICD-10-CM | POA: Diagnosis not present

## 2021-05-18 DIAGNOSIS — J9621 Acute and chronic respiratory failure with hypoxia: Secondary | ICD-10-CM | POA: Diagnosis not present

## 2021-05-18 DIAGNOSIS — J449 Chronic obstructive pulmonary disease, unspecified: Secondary | ICD-10-CM | POA: Diagnosis not present

## 2021-05-18 DIAGNOSIS — F17210 Nicotine dependence, cigarettes, uncomplicated: Secondary | ICD-10-CM | POA: Diagnosis not present

## 2021-05-18 DIAGNOSIS — E1142 Type 2 diabetes mellitus with diabetic polyneuropathy: Secondary | ICD-10-CM | POA: Diagnosis not present

## 2021-05-20 DIAGNOSIS — J9621 Acute and chronic respiratory failure with hypoxia: Secondary | ICD-10-CM | POA: Diagnosis not present

## 2021-05-20 DIAGNOSIS — E1142 Type 2 diabetes mellitus with diabetic polyneuropathy: Secondary | ICD-10-CM | POA: Diagnosis not present

## 2021-05-20 DIAGNOSIS — F32A Depression, unspecified: Secondary | ICD-10-CM | POA: Diagnosis not present

## 2021-05-20 DIAGNOSIS — J449 Chronic obstructive pulmonary disease, unspecified: Secondary | ICD-10-CM | POA: Diagnosis not present

## 2021-05-20 DIAGNOSIS — F17210 Nicotine dependence, cigarettes, uncomplicated: Secondary | ICD-10-CM | POA: Diagnosis not present

## 2021-05-21 DIAGNOSIS — E1142 Type 2 diabetes mellitus with diabetic polyneuropathy: Secondary | ICD-10-CM | POA: Diagnosis not present

## 2021-05-21 DIAGNOSIS — F32A Depression, unspecified: Secondary | ICD-10-CM | POA: Diagnosis not present

## 2021-05-21 DIAGNOSIS — J449 Chronic obstructive pulmonary disease, unspecified: Secondary | ICD-10-CM | POA: Diagnosis not present

## 2021-05-21 DIAGNOSIS — J9621 Acute and chronic respiratory failure with hypoxia: Secondary | ICD-10-CM | POA: Diagnosis not present

## 2021-05-21 DIAGNOSIS — F17210 Nicotine dependence, cigarettes, uncomplicated: Secondary | ICD-10-CM | POA: Diagnosis not present

## 2021-05-22 DIAGNOSIS — F17210 Nicotine dependence, cigarettes, uncomplicated: Secondary | ICD-10-CM | POA: Diagnosis not present

## 2021-05-22 DIAGNOSIS — J9621 Acute and chronic respiratory failure with hypoxia: Secondary | ICD-10-CM | POA: Diagnosis not present

## 2021-05-22 DIAGNOSIS — J449 Chronic obstructive pulmonary disease, unspecified: Secondary | ICD-10-CM | POA: Diagnosis not present

## 2021-05-22 DIAGNOSIS — E1142 Type 2 diabetes mellitus with diabetic polyneuropathy: Secondary | ICD-10-CM | POA: Diagnosis not present

## 2021-05-22 DIAGNOSIS — F32A Depression, unspecified: Secondary | ICD-10-CM | POA: Diagnosis not present

## 2021-05-25 DIAGNOSIS — E1142 Type 2 diabetes mellitus with diabetic polyneuropathy: Secondary | ICD-10-CM | POA: Diagnosis not present

## 2021-05-25 DIAGNOSIS — J449 Chronic obstructive pulmonary disease, unspecified: Secondary | ICD-10-CM | POA: Diagnosis not present

## 2021-05-25 DIAGNOSIS — S4291XS Fracture of right shoulder girdle, part unspecified, sequela: Secondary | ICD-10-CM | POA: Diagnosis not present

## 2021-05-25 DIAGNOSIS — F259 Schizoaffective disorder, unspecified: Secondary | ICD-10-CM | POA: Diagnosis not present

## 2021-05-25 DIAGNOSIS — Z741 Need for assistance with personal care: Secondary | ICD-10-CM | POA: Diagnosis not present

## 2021-05-25 DIAGNOSIS — K5900 Constipation, unspecified: Secondary | ICD-10-CM | POA: Diagnosis not present

## 2021-05-25 DIAGNOSIS — R251 Tremor, unspecified: Secondary | ICD-10-CM | POA: Diagnosis not present

## 2021-05-25 DIAGNOSIS — Z794 Long term (current) use of insulin: Secondary | ICD-10-CM | POA: Diagnosis not present

## 2021-05-25 DIAGNOSIS — J9621 Acute and chronic respiratory failure with hypoxia: Secondary | ICD-10-CM | POA: Diagnosis not present

## 2021-05-25 DIAGNOSIS — R32 Unspecified urinary incontinence: Secondary | ICD-10-CM | POA: Diagnosis not present

## 2021-05-25 DIAGNOSIS — H409 Unspecified glaucoma: Secondary | ICD-10-CM | POA: Diagnosis not present

## 2021-05-25 DIAGNOSIS — Z9981 Dependence on supplemental oxygen: Secondary | ICD-10-CM | POA: Diagnosis not present

## 2021-05-25 DIAGNOSIS — F17210 Nicotine dependence, cigarettes, uncomplicated: Secondary | ICD-10-CM | POA: Diagnosis not present

## 2021-05-25 DIAGNOSIS — F32A Depression, unspecified: Secondary | ICD-10-CM | POA: Diagnosis not present

## 2021-05-25 DIAGNOSIS — R159 Full incontinence of feces: Secondary | ICD-10-CM | POA: Diagnosis not present

## 2021-05-25 DIAGNOSIS — I10 Essential (primary) hypertension: Secondary | ICD-10-CM | POA: Diagnosis not present

## 2021-05-25 DIAGNOSIS — Z6841 Body Mass Index (BMI) 40.0 and over, adult: Secondary | ICD-10-CM | POA: Diagnosis not present

## 2021-05-27 DIAGNOSIS — F32A Depression, unspecified: Secondary | ICD-10-CM | POA: Diagnosis not present

## 2021-05-27 DIAGNOSIS — J449 Chronic obstructive pulmonary disease, unspecified: Secondary | ICD-10-CM | POA: Diagnosis not present

## 2021-05-27 DIAGNOSIS — J9621 Acute and chronic respiratory failure with hypoxia: Secondary | ICD-10-CM | POA: Diagnosis not present

## 2021-05-27 DIAGNOSIS — F17210 Nicotine dependence, cigarettes, uncomplicated: Secondary | ICD-10-CM | POA: Diagnosis not present

## 2021-05-27 DIAGNOSIS — E1142 Type 2 diabetes mellitus with diabetic polyneuropathy: Secondary | ICD-10-CM | POA: Diagnosis not present

## 2021-05-28 DIAGNOSIS — J9621 Acute and chronic respiratory failure with hypoxia: Secondary | ICD-10-CM | POA: Diagnosis not present

## 2021-05-28 DIAGNOSIS — F17210 Nicotine dependence, cigarettes, uncomplicated: Secondary | ICD-10-CM | POA: Diagnosis not present

## 2021-05-28 DIAGNOSIS — J449 Chronic obstructive pulmonary disease, unspecified: Secondary | ICD-10-CM | POA: Diagnosis not present

## 2021-05-28 DIAGNOSIS — E1142 Type 2 diabetes mellitus with diabetic polyneuropathy: Secondary | ICD-10-CM | POA: Diagnosis not present

## 2021-05-28 DIAGNOSIS — F32A Depression, unspecified: Secondary | ICD-10-CM | POA: Diagnosis not present

## 2021-05-29 DIAGNOSIS — E1142 Type 2 diabetes mellitus with diabetic polyneuropathy: Secondary | ICD-10-CM | POA: Diagnosis not present

## 2021-05-29 DIAGNOSIS — J9621 Acute and chronic respiratory failure with hypoxia: Secondary | ICD-10-CM | POA: Diagnosis not present

## 2021-05-29 DIAGNOSIS — F17210 Nicotine dependence, cigarettes, uncomplicated: Secondary | ICD-10-CM | POA: Diagnosis not present

## 2021-05-29 DIAGNOSIS — J449 Chronic obstructive pulmonary disease, unspecified: Secondary | ICD-10-CM | POA: Diagnosis not present

## 2021-05-29 DIAGNOSIS — F32A Depression, unspecified: Secondary | ICD-10-CM | POA: Diagnosis not present

## 2021-06-01 DIAGNOSIS — E1142 Type 2 diabetes mellitus with diabetic polyneuropathy: Secondary | ICD-10-CM | POA: Diagnosis not present

## 2021-06-01 DIAGNOSIS — F17210 Nicotine dependence, cigarettes, uncomplicated: Secondary | ICD-10-CM | POA: Diagnosis not present

## 2021-06-01 DIAGNOSIS — J449 Chronic obstructive pulmonary disease, unspecified: Secondary | ICD-10-CM | POA: Diagnosis not present

## 2021-06-01 DIAGNOSIS — F32A Depression, unspecified: Secondary | ICD-10-CM | POA: Diagnosis not present

## 2021-06-01 DIAGNOSIS — J9621 Acute and chronic respiratory failure with hypoxia: Secondary | ICD-10-CM | POA: Diagnosis not present

## 2021-06-03 DIAGNOSIS — J449 Chronic obstructive pulmonary disease, unspecified: Secondary | ICD-10-CM | POA: Diagnosis not present

## 2021-06-03 DIAGNOSIS — F17210 Nicotine dependence, cigarettes, uncomplicated: Secondary | ICD-10-CM | POA: Diagnosis not present

## 2021-06-03 DIAGNOSIS — F32A Depression, unspecified: Secondary | ICD-10-CM | POA: Diagnosis not present

## 2021-06-03 DIAGNOSIS — E1142 Type 2 diabetes mellitus with diabetic polyneuropathy: Secondary | ICD-10-CM | POA: Diagnosis not present

## 2021-06-03 DIAGNOSIS — J9621 Acute and chronic respiratory failure with hypoxia: Secondary | ICD-10-CM | POA: Diagnosis not present

## 2021-06-04 DIAGNOSIS — F17210 Nicotine dependence, cigarettes, uncomplicated: Secondary | ICD-10-CM | POA: Diagnosis not present

## 2021-06-04 DIAGNOSIS — J9621 Acute and chronic respiratory failure with hypoxia: Secondary | ICD-10-CM | POA: Diagnosis not present

## 2021-06-04 DIAGNOSIS — E1142 Type 2 diabetes mellitus with diabetic polyneuropathy: Secondary | ICD-10-CM | POA: Diagnosis not present

## 2021-06-04 DIAGNOSIS — J449 Chronic obstructive pulmonary disease, unspecified: Secondary | ICD-10-CM | POA: Diagnosis not present

## 2021-06-04 DIAGNOSIS — F32A Depression, unspecified: Secondary | ICD-10-CM | POA: Diagnosis not present

## 2021-06-05 DIAGNOSIS — E785 Hyperlipidemia, unspecified: Secondary | ICD-10-CM | POA: Diagnosis not present

## 2021-06-05 DIAGNOSIS — Z794 Long term (current) use of insulin: Secondary | ICD-10-CM | POA: Diagnosis not present

## 2021-06-05 DIAGNOSIS — F17210 Nicotine dependence, cigarettes, uncomplicated: Secondary | ICD-10-CM | POA: Diagnosis not present

## 2021-06-05 DIAGNOSIS — F32A Depression, unspecified: Secondary | ICD-10-CM | POA: Diagnosis not present

## 2021-06-05 DIAGNOSIS — F259 Schizoaffective disorder, unspecified: Secondary | ICD-10-CM | POA: Diagnosis not present

## 2021-06-05 DIAGNOSIS — J449 Chronic obstructive pulmonary disease, unspecified: Secondary | ICD-10-CM | POA: Diagnosis not present

## 2021-06-05 DIAGNOSIS — I10 Essential (primary) hypertension: Secondary | ICD-10-CM | POA: Diagnosis not present

## 2021-06-05 DIAGNOSIS — J9621 Acute and chronic respiratory failure with hypoxia: Secondary | ICD-10-CM | POA: Diagnosis not present

## 2021-06-05 DIAGNOSIS — E1142 Type 2 diabetes mellitus with diabetic polyneuropathy: Secondary | ICD-10-CM | POA: Diagnosis not present

## 2021-06-05 DIAGNOSIS — E1169 Type 2 diabetes mellitus with other specified complication: Secondary | ICD-10-CM | POA: Diagnosis not present

## 2021-06-05 DIAGNOSIS — J8410 Pulmonary fibrosis, unspecified: Secondary | ICD-10-CM | POA: Diagnosis not present

## 2021-06-08 DIAGNOSIS — F17210 Nicotine dependence, cigarettes, uncomplicated: Secondary | ICD-10-CM | POA: Diagnosis not present

## 2021-06-08 DIAGNOSIS — J449 Chronic obstructive pulmonary disease, unspecified: Secondary | ICD-10-CM | POA: Diagnosis not present

## 2021-06-08 DIAGNOSIS — J9621 Acute and chronic respiratory failure with hypoxia: Secondary | ICD-10-CM | POA: Diagnosis not present

## 2021-06-08 DIAGNOSIS — F32A Depression, unspecified: Secondary | ICD-10-CM | POA: Diagnosis not present

## 2021-06-08 DIAGNOSIS — E1142 Type 2 diabetes mellitus with diabetic polyneuropathy: Secondary | ICD-10-CM | POA: Diagnosis not present

## 2021-06-10 DIAGNOSIS — J449 Chronic obstructive pulmonary disease, unspecified: Secondary | ICD-10-CM | POA: Diagnosis not present

## 2021-06-10 DIAGNOSIS — F32A Depression, unspecified: Secondary | ICD-10-CM | POA: Diagnosis not present

## 2021-06-10 DIAGNOSIS — E1142 Type 2 diabetes mellitus with diabetic polyneuropathy: Secondary | ICD-10-CM | POA: Diagnosis not present

## 2021-06-10 DIAGNOSIS — J9621 Acute and chronic respiratory failure with hypoxia: Secondary | ICD-10-CM | POA: Diagnosis not present

## 2021-06-10 DIAGNOSIS — F17210 Nicotine dependence, cigarettes, uncomplicated: Secondary | ICD-10-CM | POA: Diagnosis not present

## 2021-06-11 DIAGNOSIS — E1142 Type 2 diabetes mellitus with diabetic polyneuropathy: Secondary | ICD-10-CM | POA: Diagnosis not present

## 2021-06-11 DIAGNOSIS — J449 Chronic obstructive pulmonary disease, unspecified: Secondary | ICD-10-CM | POA: Diagnosis not present

## 2021-06-11 DIAGNOSIS — F17210 Nicotine dependence, cigarettes, uncomplicated: Secondary | ICD-10-CM | POA: Diagnosis not present

## 2021-06-11 DIAGNOSIS — F32A Depression, unspecified: Secondary | ICD-10-CM | POA: Diagnosis not present

## 2021-06-11 DIAGNOSIS — J9621 Acute and chronic respiratory failure with hypoxia: Secondary | ICD-10-CM | POA: Diagnosis not present

## 2021-06-12 DIAGNOSIS — J449 Chronic obstructive pulmonary disease, unspecified: Secondary | ICD-10-CM | POA: Diagnosis not present

## 2021-06-12 DIAGNOSIS — F32A Depression, unspecified: Secondary | ICD-10-CM | POA: Diagnosis not present

## 2021-06-12 DIAGNOSIS — F17210 Nicotine dependence, cigarettes, uncomplicated: Secondary | ICD-10-CM | POA: Diagnosis not present

## 2021-06-12 DIAGNOSIS — E1142 Type 2 diabetes mellitus with diabetic polyneuropathy: Secondary | ICD-10-CM | POA: Diagnosis not present

## 2021-06-12 DIAGNOSIS — J9621 Acute and chronic respiratory failure with hypoxia: Secondary | ICD-10-CM | POA: Diagnosis not present

## 2021-06-15 DIAGNOSIS — F32A Depression, unspecified: Secondary | ICD-10-CM | POA: Diagnosis not present

## 2021-06-15 DIAGNOSIS — E1142 Type 2 diabetes mellitus with diabetic polyneuropathy: Secondary | ICD-10-CM | POA: Diagnosis not present

## 2021-06-15 DIAGNOSIS — J449 Chronic obstructive pulmonary disease, unspecified: Secondary | ICD-10-CM | POA: Diagnosis not present

## 2021-06-15 DIAGNOSIS — J9621 Acute and chronic respiratory failure with hypoxia: Secondary | ICD-10-CM | POA: Diagnosis not present

## 2021-06-15 DIAGNOSIS — F17210 Nicotine dependence, cigarettes, uncomplicated: Secondary | ICD-10-CM | POA: Diagnosis not present

## 2021-06-17 DIAGNOSIS — F17210 Nicotine dependence, cigarettes, uncomplicated: Secondary | ICD-10-CM | POA: Diagnosis not present

## 2021-06-17 DIAGNOSIS — F32A Depression, unspecified: Secondary | ICD-10-CM | POA: Diagnosis not present

## 2021-06-17 DIAGNOSIS — E1142 Type 2 diabetes mellitus with diabetic polyneuropathy: Secondary | ICD-10-CM | POA: Diagnosis not present

## 2021-06-17 DIAGNOSIS — J9621 Acute and chronic respiratory failure with hypoxia: Secondary | ICD-10-CM | POA: Diagnosis not present

## 2021-06-17 DIAGNOSIS — J449 Chronic obstructive pulmonary disease, unspecified: Secondary | ICD-10-CM | POA: Diagnosis not present

## 2021-06-18 DIAGNOSIS — F32A Depression, unspecified: Secondary | ICD-10-CM | POA: Diagnosis not present

## 2021-06-18 DIAGNOSIS — F17210 Nicotine dependence, cigarettes, uncomplicated: Secondary | ICD-10-CM | POA: Diagnosis not present

## 2021-06-18 DIAGNOSIS — E1142 Type 2 diabetes mellitus with diabetic polyneuropathy: Secondary | ICD-10-CM | POA: Diagnosis not present

## 2021-06-18 DIAGNOSIS — J9621 Acute and chronic respiratory failure with hypoxia: Secondary | ICD-10-CM | POA: Diagnosis not present

## 2021-06-18 DIAGNOSIS — J449 Chronic obstructive pulmonary disease, unspecified: Secondary | ICD-10-CM | POA: Diagnosis not present

## 2021-06-19 DIAGNOSIS — E1142 Type 2 diabetes mellitus with diabetic polyneuropathy: Secondary | ICD-10-CM | POA: Diagnosis not present

## 2021-06-19 DIAGNOSIS — F17210 Nicotine dependence, cigarettes, uncomplicated: Secondary | ICD-10-CM | POA: Diagnosis not present

## 2021-06-19 DIAGNOSIS — F32A Depression, unspecified: Secondary | ICD-10-CM | POA: Diagnosis not present

## 2021-06-19 DIAGNOSIS — J9621 Acute and chronic respiratory failure with hypoxia: Secondary | ICD-10-CM | POA: Diagnosis not present

## 2021-06-19 DIAGNOSIS — J449 Chronic obstructive pulmonary disease, unspecified: Secondary | ICD-10-CM | POA: Diagnosis not present

## 2021-06-22 DIAGNOSIS — F17210 Nicotine dependence, cigarettes, uncomplicated: Secondary | ICD-10-CM | POA: Diagnosis not present

## 2021-06-22 DIAGNOSIS — F32A Depression, unspecified: Secondary | ICD-10-CM | POA: Diagnosis not present

## 2021-06-22 DIAGNOSIS — J9621 Acute and chronic respiratory failure with hypoxia: Secondary | ICD-10-CM | POA: Diagnosis not present

## 2021-06-22 DIAGNOSIS — E1142 Type 2 diabetes mellitus with diabetic polyneuropathy: Secondary | ICD-10-CM | POA: Diagnosis not present

## 2021-06-22 DIAGNOSIS — J449 Chronic obstructive pulmonary disease, unspecified: Secondary | ICD-10-CM | POA: Diagnosis not present

## 2021-06-24 DIAGNOSIS — F32A Depression, unspecified: Secondary | ICD-10-CM | POA: Diagnosis not present

## 2021-06-24 DIAGNOSIS — J449 Chronic obstructive pulmonary disease, unspecified: Secondary | ICD-10-CM | POA: Diagnosis not present

## 2021-06-24 DIAGNOSIS — F17210 Nicotine dependence, cigarettes, uncomplicated: Secondary | ICD-10-CM | POA: Diagnosis not present

## 2021-06-24 DIAGNOSIS — E1142 Type 2 diabetes mellitus with diabetic polyneuropathy: Secondary | ICD-10-CM | POA: Diagnosis not present

## 2021-06-24 DIAGNOSIS — J9621 Acute and chronic respiratory failure with hypoxia: Secondary | ICD-10-CM | POA: Diagnosis not present

## 2021-06-25 DIAGNOSIS — F32A Depression, unspecified: Secondary | ICD-10-CM | POA: Diagnosis not present

## 2021-06-25 DIAGNOSIS — E1142 Type 2 diabetes mellitus with diabetic polyneuropathy: Secondary | ICD-10-CM | POA: Diagnosis not present

## 2021-06-25 DIAGNOSIS — R32 Unspecified urinary incontinence: Secondary | ICD-10-CM | POA: Diagnosis not present

## 2021-06-25 DIAGNOSIS — F17210 Nicotine dependence, cigarettes, uncomplicated: Secondary | ICD-10-CM | POA: Diagnosis not present

## 2021-06-25 DIAGNOSIS — Z9981 Dependence on supplemental oxygen: Secondary | ICD-10-CM | POA: Diagnosis not present

## 2021-06-25 DIAGNOSIS — Z794 Long term (current) use of insulin: Secondary | ICD-10-CM | POA: Diagnosis not present

## 2021-06-25 DIAGNOSIS — H409 Unspecified glaucoma: Secondary | ICD-10-CM | POA: Diagnosis not present

## 2021-06-25 DIAGNOSIS — K5900 Constipation, unspecified: Secondary | ICD-10-CM | POA: Diagnosis not present

## 2021-06-25 DIAGNOSIS — J9621 Acute and chronic respiratory failure with hypoxia: Secondary | ICD-10-CM | POA: Diagnosis not present

## 2021-06-25 DIAGNOSIS — Z741 Need for assistance with personal care: Secondary | ICD-10-CM | POA: Diagnosis not present

## 2021-06-25 DIAGNOSIS — F259 Schizoaffective disorder, unspecified: Secondary | ICD-10-CM | POA: Diagnosis not present

## 2021-06-25 DIAGNOSIS — R251 Tremor, unspecified: Secondary | ICD-10-CM | POA: Diagnosis not present

## 2021-06-25 DIAGNOSIS — S4291XS Fracture of right shoulder girdle, part unspecified, sequela: Secondary | ICD-10-CM | POA: Diagnosis not present

## 2021-06-25 DIAGNOSIS — J449 Chronic obstructive pulmonary disease, unspecified: Secondary | ICD-10-CM | POA: Diagnosis not present

## 2021-06-25 DIAGNOSIS — Z6841 Body Mass Index (BMI) 40.0 and over, adult: Secondary | ICD-10-CM | POA: Diagnosis not present

## 2021-06-25 DIAGNOSIS — R159 Full incontinence of feces: Secondary | ICD-10-CM | POA: Diagnosis not present

## 2021-06-25 DIAGNOSIS — I10 Essential (primary) hypertension: Secondary | ICD-10-CM | POA: Diagnosis not present

## 2021-06-26 DIAGNOSIS — E1142 Type 2 diabetes mellitus with diabetic polyneuropathy: Secondary | ICD-10-CM | POA: Diagnosis not present

## 2021-06-26 DIAGNOSIS — J449 Chronic obstructive pulmonary disease, unspecified: Secondary | ICD-10-CM | POA: Diagnosis not present

## 2021-06-26 DIAGNOSIS — J9621 Acute and chronic respiratory failure with hypoxia: Secondary | ICD-10-CM | POA: Diagnosis not present

## 2021-06-26 DIAGNOSIS — F32A Depression, unspecified: Secondary | ICD-10-CM | POA: Diagnosis not present

## 2021-06-26 DIAGNOSIS — F17210 Nicotine dependence, cigarettes, uncomplicated: Secondary | ICD-10-CM | POA: Diagnosis not present

## 2021-06-29 DIAGNOSIS — F32A Depression, unspecified: Secondary | ICD-10-CM | POA: Diagnosis not present

## 2021-06-29 DIAGNOSIS — E1142 Type 2 diabetes mellitus with diabetic polyneuropathy: Secondary | ICD-10-CM | POA: Diagnosis not present

## 2021-06-29 DIAGNOSIS — J9621 Acute and chronic respiratory failure with hypoxia: Secondary | ICD-10-CM | POA: Diagnosis not present

## 2021-06-29 DIAGNOSIS — F411 Generalized anxiety disorder: Secondary | ICD-10-CM | POA: Diagnosis not present

## 2021-06-29 DIAGNOSIS — J449 Chronic obstructive pulmonary disease, unspecified: Secondary | ICD-10-CM | POA: Diagnosis not present

## 2021-06-29 DIAGNOSIS — F17210 Nicotine dependence, cigarettes, uncomplicated: Secondary | ICD-10-CM | POA: Diagnosis not present

## 2021-06-29 DIAGNOSIS — F259 Schizoaffective disorder, unspecified: Secondary | ICD-10-CM | POA: Diagnosis not present

## 2021-07-01 DIAGNOSIS — J449 Chronic obstructive pulmonary disease, unspecified: Secondary | ICD-10-CM | POA: Diagnosis not present

## 2021-07-01 DIAGNOSIS — F32A Depression, unspecified: Secondary | ICD-10-CM | POA: Diagnosis not present

## 2021-07-01 DIAGNOSIS — J9621 Acute and chronic respiratory failure with hypoxia: Secondary | ICD-10-CM | POA: Diagnosis not present

## 2021-07-01 DIAGNOSIS — E1142 Type 2 diabetes mellitus with diabetic polyneuropathy: Secondary | ICD-10-CM | POA: Diagnosis not present

## 2021-07-01 DIAGNOSIS — F17210 Nicotine dependence, cigarettes, uncomplicated: Secondary | ICD-10-CM | POA: Diagnosis not present

## 2021-07-02 DIAGNOSIS — J9621 Acute and chronic respiratory failure with hypoxia: Secondary | ICD-10-CM | POA: Diagnosis not present

## 2021-07-02 DIAGNOSIS — F32A Depression, unspecified: Secondary | ICD-10-CM | POA: Diagnosis not present

## 2021-07-02 DIAGNOSIS — J449 Chronic obstructive pulmonary disease, unspecified: Secondary | ICD-10-CM | POA: Diagnosis not present

## 2021-07-02 DIAGNOSIS — F17210 Nicotine dependence, cigarettes, uncomplicated: Secondary | ICD-10-CM | POA: Diagnosis not present

## 2021-07-02 DIAGNOSIS — E1142 Type 2 diabetes mellitus with diabetic polyneuropathy: Secondary | ICD-10-CM | POA: Diagnosis not present

## 2021-07-03 DIAGNOSIS — J449 Chronic obstructive pulmonary disease, unspecified: Secondary | ICD-10-CM | POA: Diagnosis not present

## 2021-07-03 DIAGNOSIS — F17210 Nicotine dependence, cigarettes, uncomplicated: Secondary | ICD-10-CM | POA: Diagnosis not present

## 2021-07-03 DIAGNOSIS — J9621 Acute and chronic respiratory failure with hypoxia: Secondary | ICD-10-CM | POA: Diagnosis not present

## 2021-07-03 DIAGNOSIS — E1142 Type 2 diabetes mellitus with diabetic polyneuropathy: Secondary | ICD-10-CM | POA: Diagnosis not present

## 2021-07-03 DIAGNOSIS — F32A Depression, unspecified: Secondary | ICD-10-CM | POA: Diagnosis not present

## 2021-07-06 DIAGNOSIS — F17210 Nicotine dependence, cigarettes, uncomplicated: Secondary | ICD-10-CM | POA: Diagnosis not present

## 2021-07-06 DIAGNOSIS — J449 Chronic obstructive pulmonary disease, unspecified: Secondary | ICD-10-CM | POA: Diagnosis not present

## 2021-07-06 DIAGNOSIS — E1142 Type 2 diabetes mellitus with diabetic polyneuropathy: Secondary | ICD-10-CM | POA: Diagnosis not present

## 2021-07-06 DIAGNOSIS — J9621 Acute and chronic respiratory failure with hypoxia: Secondary | ICD-10-CM | POA: Diagnosis not present

## 2021-07-06 DIAGNOSIS — F32A Depression, unspecified: Secondary | ICD-10-CM | POA: Diagnosis not present

## 2021-07-08 DIAGNOSIS — F32A Depression, unspecified: Secondary | ICD-10-CM | POA: Diagnosis not present

## 2021-07-08 DIAGNOSIS — F17210 Nicotine dependence, cigarettes, uncomplicated: Secondary | ICD-10-CM | POA: Diagnosis not present

## 2021-07-08 DIAGNOSIS — J9621 Acute and chronic respiratory failure with hypoxia: Secondary | ICD-10-CM | POA: Diagnosis not present

## 2021-07-08 DIAGNOSIS — J449 Chronic obstructive pulmonary disease, unspecified: Secondary | ICD-10-CM | POA: Diagnosis not present

## 2021-07-08 DIAGNOSIS — E1142 Type 2 diabetes mellitus with diabetic polyneuropathy: Secondary | ICD-10-CM | POA: Diagnosis not present

## 2021-07-09 DIAGNOSIS — F17210 Nicotine dependence, cigarettes, uncomplicated: Secondary | ICD-10-CM | POA: Diagnosis not present

## 2021-07-09 DIAGNOSIS — J9621 Acute and chronic respiratory failure with hypoxia: Secondary | ICD-10-CM | POA: Diagnosis not present

## 2021-07-09 DIAGNOSIS — J449 Chronic obstructive pulmonary disease, unspecified: Secondary | ICD-10-CM | POA: Diagnosis not present

## 2021-07-09 DIAGNOSIS — E1142 Type 2 diabetes mellitus with diabetic polyneuropathy: Secondary | ICD-10-CM | POA: Diagnosis not present

## 2021-07-09 DIAGNOSIS — F32A Depression, unspecified: Secondary | ICD-10-CM | POA: Diagnosis not present

## 2021-07-10 DIAGNOSIS — F32A Depression, unspecified: Secondary | ICD-10-CM | POA: Diagnosis not present

## 2021-07-10 DIAGNOSIS — F17210 Nicotine dependence, cigarettes, uncomplicated: Secondary | ICD-10-CM | POA: Diagnosis not present

## 2021-07-10 DIAGNOSIS — J449 Chronic obstructive pulmonary disease, unspecified: Secondary | ICD-10-CM | POA: Diagnosis not present

## 2021-07-10 DIAGNOSIS — J9621 Acute and chronic respiratory failure with hypoxia: Secondary | ICD-10-CM | POA: Diagnosis not present

## 2021-07-10 DIAGNOSIS — E1142 Type 2 diabetes mellitus with diabetic polyneuropathy: Secondary | ICD-10-CM | POA: Diagnosis not present

## 2021-07-13 DIAGNOSIS — F17210 Nicotine dependence, cigarettes, uncomplicated: Secondary | ICD-10-CM | POA: Diagnosis not present

## 2021-07-13 DIAGNOSIS — J9621 Acute and chronic respiratory failure with hypoxia: Secondary | ICD-10-CM | POA: Diagnosis not present

## 2021-07-13 DIAGNOSIS — F32A Depression, unspecified: Secondary | ICD-10-CM | POA: Diagnosis not present

## 2021-07-13 DIAGNOSIS — E1142 Type 2 diabetes mellitus with diabetic polyneuropathy: Secondary | ICD-10-CM | POA: Diagnosis not present

## 2021-07-13 DIAGNOSIS — J449 Chronic obstructive pulmonary disease, unspecified: Secondary | ICD-10-CM | POA: Diagnosis not present

## 2021-07-15 DIAGNOSIS — E1142 Type 2 diabetes mellitus with diabetic polyneuropathy: Secondary | ICD-10-CM | POA: Diagnosis not present

## 2021-07-15 DIAGNOSIS — J449 Chronic obstructive pulmonary disease, unspecified: Secondary | ICD-10-CM | POA: Diagnosis not present

## 2021-07-15 DIAGNOSIS — F32A Depression, unspecified: Secondary | ICD-10-CM | POA: Diagnosis not present

## 2021-07-15 DIAGNOSIS — J9621 Acute and chronic respiratory failure with hypoxia: Secondary | ICD-10-CM | POA: Diagnosis not present

## 2021-07-15 DIAGNOSIS — F17210 Nicotine dependence, cigarettes, uncomplicated: Secondary | ICD-10-CM | POA: Diagnosis not present

## 2021-07-16 DIAGNOSIS — J9621 Acute and chronic respiratory failure with hypoxia: Secondary | ICD-10-CM | POA: Diagnosis not present

## 2021-07-16 DIAGNOSIS — J449 Chronic obstructive pulmonary disease, unspecified: Secondary | ICD-10-CM | POA: Diagnosis not present

## 2021-07-16 DIAGNOSIS — F17210 Nicotine dependence, cigarettes, uncomplicated: Secondary | ICD-10-CM | POA: Diagnosis not present

## 2021-07-16 DIAGNOSIS — F32A Depression, unspecified: Secondary | ICD-10-CM | POA: Diagnosis not present

## 2021-07-16 DIAGNOSIS — E1142 Type 2 diabetes mellitus with diabetic polyneuropathy: Secondary | ICD-10-CM | POA: Diagnosis not present

## 2021-07-17 DIAGNOSIS — J449 Chronic obstructive pulmonary disease, unspecified: Secondary | ICD-10-CM | POA: Diagnosis not present

## 2021-07-17 DIAGNOSIS — F17210 Nicotine dependence, cigarettes, uncomplicated: Secondary | ICD-10-CM | POA: Diagnosis not present

## 2021-07-17 DIAGNOSIS — F32A Depression, unspecified: Secondary | ICD-10-CM | POA: Diagnosis not present

## 2021-07-17 DIAGNOSIS — E1142 Type 2 diabetes mellitus with diabetic polyneuropathy: Secondary | ICD-10-CM | POA: Diagnosis not present

## 2021-07-17 DIAGNOSIS — J9621 Acute and chronic respiratory failure with hypoxia: Secondary | ICD-10-CM | POA: Diagnosis not present

## 2021-07-18 DIAGNOSIS — J9621 Acute and chronic respiratory failure with hypoxia: Secondary | ICD-10-CM | POA: Diagnosis not present

## 2021-07-18 DIAGNOSIS — F32A Depression, unspecified: Secondary | ICD-10-CM | POA: Diagnosis not present

## 2021-07-18 DIAGNOSIS — F17210 Nicotine dependence, cigarettes, uncomplicated: Secondary | ICD-10-CM | POA: Diagnosis not present

## 2021-07-18 DIAGNOSIS — E1142 Type 2 diabetes mellitus with diabetic polyneuropathy: Secondary | ICD-10-CM | POA: Diagnosis not present

## 2021-07-18 DIAGNOSIS — J449 Chronic obstructive pulmonary disease, unspecified: Secondary | ICD-10-CM | POA: Diagnosis not present

## 2021-07-19 DIAGNOSIS — F17210 Nicotine dependence, cigarettes, uncomplicated: Secondary | ICD-10-CM | POA: Diagnosis not present

## 2021-07-19 DIAGNOSIS — E1142 Type 2 diabetes mellitus with diabetic polyneuropathy: Secondary | ICD-10-CM | POA: Diagnosis not present

## 2021-07-19 DIAGNOSIS — F32A Depression, unspecified: Secondary | ICD-10-CM | POA: Diagnosis not present

## 2021-07-19 DIAGNOSIS — J9621 Acute and chronic respiratory failure with hypoxia: Secondary | ICD-10-CM | POA: Diagnosis not present

## 2021-07-19 DIAGNOSIS — J449 Chronic obstructive pulmonary disease, unspecified: Secondary | ICD-10-CM | POA: Diagnosis not present

## 2021-07-20 DIAGNOSIS — F32A Depression, unspecified: Secondary | ICD-10-CM | POA: Diagnosis not present

## 2021-07-20 DIAGNOSIS — J9621 Acute and chronic respiratory failure with hypoxia: Secondary | ICD-10-CM | POA: Diagnosis not present

## 2021-07-20 DIAGNOSIS — F17210 Nicotine dependence, cigarettes, uncomplicated: Secondary | ICD-10-CM | POA: Diagnosis not present

## 2021-07-20 DIAGNOSIS — J449 Chronic obstructive pulmonary disease, unspecified: Secondary | ICD-10-CM | POA: Diagnosis not present

## 2021-07-20 DIAGNOSIS — E1142 Type 2 diabetes mellitus with diabetic polyneuropathy: Secondary | ICD-10-CM | POA: Diagnosis not present

## 2021-07-21 DIAGNOSIS — J9621 Acute and chronic respiratory failure with hypoxia: Secondary | ICD-10-CM | POA: Diagnosis not present

## 2021-07-21 DIAGNOSIS — E1142 Type 2 diabetes mellitus with diabetic polyneuropathy: Secondary | ICD-10-CM | POA: Diagnosis not present

## 2021-07-21 DIAGNOSIS — F17210 Nicotine dependence, cigarettes, uncomplicated: Secondary | ICD-10-CM | POA: Diagnosis not present

## 2021-07-21 DIAGNOSIS — J449 Chronic obstructive pulmonary disease, unspecified: Secondary | ICD-10-CM | POA: Diagnosis not present

## 2021-07-21 DIAGNOSIS — F32A Depression, unspecified: Secondary | ICD-10-CM | POA: Diagnosis not present

## 2021-07-22 DIAGNOSIS — F17210 Nicotine dependence, cigarettes, uncomplicated: Secondary | ICD-10-CM | POA: Diagnosis not present

## 2021-07-22 DIAGNOSIS — J449 Chronic obstructive pulmonary disease, unspecified: Secondary | ICD-10-CM | POA: Diagnosis not present

## 2021-07-22 DIAGNOSIS — J9621 Acute and chronic respiratory failure with hypoxia: Secondary | ICD-10-CM | POA: Diagnosis not present

## 2021-07-22 DIAGNOSIS — E1142 Type 2 diabetes mellitus with diabetic polyneuropathy: Secondary | ICD-10-CM | POA: Diagnosis not present

## 2021-07-22 DIAGNOSIS — F32A Depression, unspecified: Secondary | ICD-10-CM | POA: Diagnosis not present

## 2021-07-24 DIAGNOSIS — F32A Depression, unspecified: Secondary | ICD-10-CM | POA: Diagnosis not present

## 2021-07-24 DIAGNOSIS — J449 Chronic obstructive pulmonary disease, unspecified: Secondary | ICD-10-CM | POA: Diagnosis not present

## 2021-07-24 DIAGNOSIS — E1142 Type 2 diabetes mellitus with diabetic polyneuropathy: Secondary | ICD-10-CM | POA: Diagnosis not present

## 2021-07-24 DIAGNOSIS — F17210 Nicotine dependence, cigarettes, uncomplicated: Secondary | ICD-10-CM | POA: Diagnosis not present

## 2021-07-24 DIAGNOSIS — J9621 Acute and chronic respiratory failure with hypoxia: Secondary | ICD-10-CM | POA: Diagnosis not present

## 2021-07-25 DIAGNOSIS — R32 Unspecified urinary incontinence: Secondary | ICD-10-CM | POA: Diagnosis not present

## 2021-07-25 DIAGNOSIS — F259 Schizoaffective disorder, unspecified: Secondary | ICD-10-CM | POA: Diagnosis not present

## 2021-07-25 DIAGNOSIS — K5900 Constipation, unspecified: Secondary | ICD-10-CM | POA: Diagnosis not present

## 2021-07-25 DIAGNOSIS — I10 Essential (primary) hypertension: Secondary | ICD-10-CM | POA: Diagnosis not present

## 2021-07-25 DIAGNOSIS — F32A Depression, unspecified: Secondary | ICD-10-CM | POA: Diagnosis not present

## 2021-07-25 DIAGNOSIS — S4291XS Fracture of right shoulder girdle, part unspecified, sequela: Secondary | ICD-10-CM | POA: Diagnosis not present

## 2021-07-25 DIAGNOSIS — Z741 Need for assistance with personal care: Secondary | ICD-10-CM | POA: Diagnosis not present

## 2021-07-25 DIAGNOSIS — F17210 Nicotine dependence, cigarettes, uncomplicated: Secondary | ICD-10-CM | POA: Diagnosis not present

## 2021-07-25 DIAGNOSIS — R159 Full incontinence of feces: Secondary | ICD-10-CM | POA: Diagnosis not present

## 2021-07-25 DIAGNOSIS — H409 Unspecified glaucoma: Secondary | ICD-10-CM | POA: Diagnosis not present

## 2021-07-25 DIAGNOSIS — Z6841 Body Mass Index (BMI) 40.0 and over, adult: Secondary | ICD-10-CM | POA: Diagnosis not present

## 2021-07-25 DIAGNOSIS — Z9981 Dependence on supplemental oxygen: Secondary | ICD-10-CM | POA: Diagnosis not present

## 2021-07-25 DIAGNOSIS — J9621 Acute and chronic respiratory failure with hypoxia: Secondary | ICD-10-CM | POA: Diagnosis not present

## 2021-07-25 DIAGNOSIS — R251 Tremor, unspecified: Secondary | ICD-10-CM | POA: Diagnosis not present

## 2021-07-25 DIAGNOSIS — E1142 Type 2 diabetes mellitus with diabetic polyneuropathy: Secondary | ICD-10-CM | POA: Diagnosis not present

## 2021-07-25 DIAGNOSIS — J449 Chronic obstructive pulmonary disease, unspecified: Secondary | ICD-10-CM | POA: Diagnosis not present

## 2021-07-25 DIAGNOSIS — Z794 Long term (current) use of insulin: Secondary | ICD-10-CM | POA: Diagnosis not present

## 2021-07-27 DIAGNOSIS — J9621 Acute and chronic respiratory failure with hypoxia: Secondary | ICD-10-CM | POA: Diagnosis not present

## 2021-07-27 DIAGNOSIS — E1142 Type 2 diabetes mellitus with diabetic polyneuropathy: Secondary | ICD-10-CM | POA: Diagnosis not present

## 2021-07-27 DIAGNOSIS — F17210 Nicotine dependence, cigarettes, uncomplicated: Secondary | ICD-10-CM | POA: Diagnosis not present

## 2021-07-27 DIAGNOSIS — J449 Chronic obstructive pulmonary disease, unspecified: Secondary | ICD-10-CM | POA: Diagnosis not present

## 2021-07-27 DIAGNOSIS — F32A Depression, unspecified: Secondary | ICD-10-CM | POA: Diagnosis not present

## 2021-07-29 DIAGNOSIS — F32A Depression, unspecified: Secondary | ICD-10-CM | POA: Diagnosis not present

## 2021-07-29 DIAGNOSIS — F17210 Nicotine dependence, cigarettes, uncomplicated: Secondary | ICD-10-CM | POA: Diagnosis not present

## 2021-07-29 DIAGNOSIS — J449 Chronic obstructive pulmonary disease, unspecified: Secondary | ICD-10-CM | POA: Diagnosis not present

## 2021-07-29 DIAGNOSIS — E1142 Type 2 diabetes mellitus with diabetic polyneuropathy: Secondary | ICD-10-CM | POA: Diagnosis not present

## 2021-07-29 DIAGNOSIS — J9621 Acute and chronic respiratory failure with hypoxia: Secondary | ICD-10-CM | POA: Diagnosis not present

## 2021-07-30 DIAGNOSIS — F32A Depression, unspecified: Secondary | ICD-10-CM | POA: Diagnosis not present

## 2021-07-30 DIAGNOSIS — F17210 Nicotine dependence, cigarettes, uncomplicated: Secondary | ICD-10-CM | POA: Diagnosis not present

## 2021-07-30 DIAGNOSIS — J449 Chronic obstructive pulmonary disease, unspecified: Secondary | ICD-10-CM | POA: Diagnosis not present

## 2021-07-30 DIAGNOSIS — J9621 Acute and chronic respiratory failure with hypoxia: Secondary | ICD-10-CM | POA: Diagnosis not present

## 2021-07-30 DIAGNOSIS — E1142 Type 2 diabetes mellitus with diabetic polyneuropathy: Secondary | ICD-10-CM | POA: Diagnosis not present

## 2021-07-31 DIAGNOSIS — J9621 Acute and chronic respiratory failure with hypoxia: Secondary | ICD-10-CM | POA: Diagnosis not present

## 2021-07-31 DIAGNOSIS — J449 Chronic obstructive pulmonary disease, unspecified: Secondary | ICD-10-CM | POA: Diagnosis not present

## 2021-07-31 DIAGNOSIS — E1142 Type 2 diabetes mellitus with diabetic polyneuropathy: Secondary | ICD-10-CM | POA: Diagnosis not present

## 2021-07-31 DIAGNOSIS — F32A Depression, unspecified: Secondary | ICD-10-CM | POA: Diagnosis not present

## 2021-07-31 DIAGNOSIS — F17210 Nicotine dependence, cigarettes, uncomplicated: Secondary | ICD-10-CM | POA: Diagnosis not present

## 2021-08-03 DIAGNOSIS — J449 Chronic obstructive pulmonary disease, unspecified: Secondary | ICD-10-CM | POA: Diagnosis not present

## 2021-08-03 DIAGNOSIS — E1142 Type 2 diabetes mellitus with diabetic polyneuropathy: Secondary | ICD-10-CM | POA: Diagnosis not present

## 2021-08-03 DIAGNOSIS — F32A Depression, unspecified: Secondary | ICD-10-CM | POA: Diagnosis not present

## 2021-08-03 DIAGNOSIS — F17210 Nicotine dependence, cigarettes, uncomplicated: Secondary | ICD-10-CM | POA: Diagnosis not present

## 2021-08-03 DIAGNOSIS — J9621 Acute and chronic respiratory failure with hypoxia: Secondary | ICD-10-CM | POA: Diagnosis not present

## 2021-08-05 DIAGNOSIS — F17210 Nicotine dependence, cigarettes, uncomplicated: Secondary | ICD-10-CM | POA: Diagnosis not present

## 2021-08-05 DIAGNOSIS — E1142 Type 2 diabetes mellitus with diabetic polyneuropathy: Secondary | ICD-10-CM | POA: Diagnosis not present

## 2021-08-05 DIAGNOSIS — J9621 Acute and chronic respiratory failure with hypoxia: Secondary | ICD-10-CM | POA: Diagnosis not present

## 2021-08-05 DIAGNOSIS — J449 Chronic obstructive pulmonary disease, unspecified: Secondary | ICD-10-CM | POA: Diagnosis not present

## 2021-08-05 DIAGNOSIS — F32A Depression, unspecified: Secondary | ICD-10-CM | POA: Diagnosis not present

## 2021-08-06 DIAGNOSIS — F17210 Nicotine dependence, cigarettes, uncomplicated: Secondary | ICD-10-CM | POA: Diagnosis not present

## 2021-08-06 DIAGNOSIS — J9621 Acute and chronic respiratory failure with hypoxia: Secondary | ICD-10-CM | POA: Diagnosis not present

## 2021-08-06 DIAGNOSIS — J449 Chronic obstructive pulmonary disease, unspecified: Secondary | ICD-10-CM | POA: Diagnosis not present

## 2021-08-06 DIAGNOSIS — F32A Depression, unspecified: Secondary | ICD-10-CM | POA: Diagnosis not present

## 2021-08-06 DIAGNOSIS — E1142 Type 2 diabetes mellitus with diabetic polyneuropathy: Secondary | ICD-10-CM | POA: Diagnosis not present

## 2021-08-07 DIAGNOSIS — F17210 Nicotine dependence, cigarettes, uncomplicated: Secondary | ICD-10-CM | POA: Diagnosis not present

## 2021-08-07 DIAGNOSIS — E1142 Type 2 diabetes mellitus with diabetic polyneuropathy: Secondary | ICD-10-CM | POA: Diagnosis not present

## 2021-08-07 DIAGNOSIS — J449 Chronic obstructive pulmonary disease, unspecified: Secondary | ICD-10-CM | POA: Diagnosis not present

## 2021-08-07 DIAGNOSIS — F32A Depression, unspecified: Secondary | ICD-10-CM | POA: Diagnosis not present

## 2021-08-07 DIAGNOSIS — J9621 Acute and chronic respiratory failure with hypoxia: Secondary | ICD-10-CM | POA: Diagnosis not present

## 2021-08-10 DIAGNOSIS — E1142 Type 2 diabetes mellitus with diabetic polyneuropathy: Secondary | ICD-10-CM | POA: Diagnosis not present

## 2021-08-10 DIAGNOSIS — J449 Chronic obstructive pulmonary disease, unspecified: Secondary | ICD-10-CM | POA: Diagnosis not present

## 2021-08-10 DIAGNOSIS — F411 Generalized anxiety disorder: Secondary | ICD-10-CM | POA: Diagnosis not present

## 2021-08-10 DIAGNOSIS — F259 Schizoaffective disorder, unspecified: Secondary | ICD-10-CM | POA: Diagnosis not present

## 2021-08-10 DIAGNOSIS — F17210 Nicotine dependence, cigarettes, uncomplicated: Secondary | ICD-10-CM | POA: Diagnosis not present

## 2021-08-10 DIAGNOSIS — F32A Depression, unspecified: Secondary | ICD-10-CM | POA: Diagnosis not present

## 2021-08-10 DIAGNOSIS — J9621 Acute and chronic respiratory failure with hypoxia: Secondary | ICD-10-CM | POA: Diagnosis not present

## 2021-08-12 DIAGNOSIS — F32A Depression, unspecified: Secondary | ICD-10-CM | POA: Diagnosis not present

## 2021-08-12 DIAGNOSIS — E1142 Type 2 diabetes mellitus with diabetic polyneuropathy: Secondary | ICD-10-CM | POA: Diagnosis not present

## 2021-08-12 DIAGNOSIS — J449 Chronic obstructive pulmonary disease, unspecified: Secondary | ICD-10-CM | POA: Diagnosis not present

## 2021-08-12 DIAGNOSIS — J9621 Acute and chronic respiratory failure with hypoxia: Secondary | ICD-10-CM | POA: Diagnosis not present

## 2021-08-12 DIAGNOSIS — F17210 Nicotine dependence, cigarettes, uncomplicated: Secondary | ICD-10-CM | POA: Diagnosis not present

## 2021-08-13 DIAGNOSIS — F32A Depression, unspecified: Secondary | ICD-10-CM | POA: Diagnosis not present

## 2021-08-13 DIAGNOSIS — J9621 Acute and chronic respiratory failure with hypoxia: Secondary | ICD-10-CM | POA: Diagnosis not present

## 2021-08-13 DIAGNOSIS — F17210 Nicotine dependence, cigarettes, uncomplicated: Secondary | ICD-10-CM | POA: Diagnosis not present

## 2021-08-13 DIAGNOSIS — E1142 Type 2 diabetes mellitus with diabetic polyneuropathy: Secondary | ICD-10-CM | POA: Diagnosis not present

## 2021-08-13 DIAGNOSIS — J449 Chronic obstructive pulmonary disease, unspecified: Secondary | ICD-10-CM | POA: Diagnosis not present

## 2021-08-14 DIAGNOSIS — F17210 Nicotine dependence, cigarettes, uncomplicated: Secondary | ICD-10-CM | POA: Diagnosis not present

## 2021-08-14 DIAGNOSIS — J449 Chronic obstructive pulmonary disease, unspecified: Secondary | ICD-10-CM | POA: Diagnosis not present

## 2021-08-14 DIAGNOSIS — E1142 Type 2 diabetes mellitus with diabetic polyneuropathy: Secondary | ICD-10-CM | POA: Diagnosis not present

## 2021-08-14 DIAGNOSIS — J9621 Acute and chronic respiratory failure with hypoxia: Secondary | ICD-10-CM | POA: Diagnosis not present

## 2021-08-14 DIAGNOSIS — F32A Depression, unspecified: Secondary | ICD-10-CM | POA: Diagnosis not present

## 2021-08-19 DIAGNOSIS — E1142 Type 2 diabetes mellitus with diabetic polyneuropathy: Secondary | ICD-10-CM | POA: Diagnosis not present

## 2021-08-19 DIAGNOSIS — J9621 Acute and chronic respiratory failure with hypoxia: Secondary | ICD-10-CM | POA: Diagnosis not present

## 2021-08-19 DIAGNOSIS — F17210 Nicotine dependence, cigarettes, uncomplicated: Secondary | ICD-10-CM | POA: Diagnosis not present

## 2021-08-19 DIAGNOSIS — F32A Depression, unspecified: Secondary | ICD-10-CM | POA: Diagnosis not present

## 2021-08-19 DIAGNOSIS — J449 Chronic obstructive pulmonary disease, unspecified: Secondary | ICD-10-CM | POA: Diagnosis not present

## 2021-08-20 DIAGNOSIS — E1142 Type 2 diabetes mellitus with diabetic polyneuropathy: Secondary | ICD-10-CM | POA: Diagnosis not present

## 2021-08-20 DIAGNOSIS — F32A Depression, unspecified: Secondary | ICD-10-CM | POA: Diagnosis not present

## 2021-08-20 DIAGNOSIS — F17210 Nicotine dependence, cigarettes, uncomplicated: Secondary | ICD-10-CM | POA: Diagnosis not present

## 2021-08-20 DIAGNOSIS — J9621 Acute and chronic respiratory failure with hypoxia: Secondary | ICD-10-CM | POA: Diagnosis not present

## 2021-08-20 DIAGNOSIS — J449 Chronic obstructive pulmonary disease, unspecified: Secondary | ICD-10-CM | POA: Diagnosis not present

## 2021-08-21 DIAGNOSIS — J9621 Acute and chronic respiratory failure with hypoxia: Secondary | ICD-10-CM | POA: Diagnosis not present

## 2021-08-21 DIAGNOSIS — J449 Chronic obstructive pulmonary disease, unspecified: Secondary | ICD-10-CM | POA: Diagnosis not present

## 2021-08-21 DIAGNOSIS — F17210 Nicotine dependence, cigarettes, uncomplicated: Secondary | ICD-10-CM | POA: Diagnosis not present

## 2021-08-21 DIAGNOSIS — F32A Depression, unspecified: Secondary | ICD-10-CM | POA: Diagnosis not present

## 2021-08-21 DIAGNOSIS — E1142 Type 2 diabetes mellitus with diabetic polyneuropathy: Secondary | ICD-10-CM | POA: Diagnosis not present

## 2021-08-24 DIAGNOSIS — F32A Depression, unspecified: Secondary | ICD-10-CM | POA: Diagnosis not present

## 2021-08-24 DIAGNOSIS — J9621 Acute and chronic respiratory failure with hypoxia: Secondary | ICD-10-CM | POA: Diagnosis not present

## 2021-08-24 DIAGNOSIS — J449 Chronic obstructive pulmonary disease, unspecified: Secondary | ICD-10-CM | POA: Diagnosis not present

## 2021-08-24 DIAGNOSIS — E1142 Type 2 diabetes mellitus with diabetic polyneuropathy: Secondary | ICD-10-CM | POA: Diagnosis not present

## 2021-08-24 DIAGNOSIS — F17210 Nicotine dependence, cigarettes, uncomplicated: Secondary | ICD-10-CM | POA: Diagnosis not present

## 2021-08-25 DIAGNOSIS — Z794 Long term (current) use of insulin: Secondary | ICD-10-CM | POA: Diagnosis not present

## 2021-08-25 DIAGNOSIS — R32 Unspecified urinary incontinence: Secondary | ICD-10-CM | POA: Diagnosis not present

## 2021-08-25 DIAGNOSIS — R251 Tremor, unspecified: Secondary | ICD-10-CM | POA: Diagnosis not present

## 2021-08-25 DIAGNOSIS — R159 Full incontinence of feces: Secondary | ICD-10-CM | POA: Diagnosis not present

## 2021-08-25 DIAGNOSIS — J449 Chronic obstructive pulmonary disease, unspecified: Secondary | ICD-10-CM | POA: Diagnosis not present

## 2021-08-25 DIAGNOSIS — K5900 Constipation, unspecified: Secondary | ICD-10-CM | POA: Diagnosis not present

## 2021-08-25 DIAGNOSIS — J9621 Acute and chronic respiratory failure with hypoxia: Secondary | ICD-10-CM | POA: Diagnosis not present

## 2021-08-25 DIAGNOSIS — F17210 Nicotine dependence, cigarettes, uncomplicated: Secondary | ICD-10-CM | POA: Diagnosis not present

## 2021-08-25 DIAGNOSIS — S4291XS Fracture of right shoulder girdle, part unspecified, sequela: Secondary | ICD-10-CM | POA: Diagnosis not present

## 2021-08-25 DIAGNOSIS — E1142 Type 2 diabetes mellitus with diabetic polyneuropathy: Secondary | ICD-10-CM | POA: Diagnosis not present

## 2021-08-25 DIAGNOSIS — Z741 Need for assistance with personal care: Secondary | ICD-10-CM | POA: Diagnosis not present

## 2021-08-25 DIAGNOSIS — Z91199 Patient's noncompliance with other medical treatment and regimen due to unspecified reason: Secondary | ICD-10-CM | POA: Diagnosis not present

## 2021-08-25 DIAGNOSIS — F259 Schizoaffective disorder, unspecified: Secondary | ICD-10-CM | POA: Diagnosis not present

## 2021-08-25 DIAGNOSIS — I10 Essential (primary) hypertension: Secondary | ICD-10-CM | POA: Diagnosis not present

## 2021-08-25 DIAGNOSIS — H409 Unspecified glaucoma: Secondary | ICD-10-CM | POA: Diagnosis not present

## 2021-08-25 DIAGNOSIS — F32A Depression, unspecified: Secondary | ICD-10-CM | POA: Diagnosis not present

## 2021-08-25 DIAGNOSIS — Z9981 Dependence on supplemental oxygen: Secondary | ICD-10-CM | POA: Diagnosis not present

## 2021-08-25 DIAGNOSIS — Z6841 Body Mass Index (BMI) 40.0 and over, adult: Secondary | ICD-10-CM | POA: Diagnosis not present

## 2021-08-26 ENCOUNTER — Other Ambulatory Visit: Payer: Self-pay | Admitting: *Deleted

## 2021-08-26 DIAGNOSIS — F17210 Nicotine dependence, cigarettes, uncomplicated: Secondary | ICD-10-CM | POA: Diagnosis not present

## 2021-08-26 DIAGNOSIS — E1142 Type 2 diabetes mellitus with diabetic polyneuropathy: Secondary | ICD-10-CM | POA: Diagnosis not present

## 2021-08-26 DIAGNOSIS — F32A Depression, unspecified: Secondary | ICD-10-CM | POA: Diagnosis not present

## 2021-08-26 DIAGNOSIS — J9621 Acute and chronic respiratory failure with hypoxia: Secondary | ICD-10-CM | POA: Diagnosis not present

## 2021-08-26 DIAGNOSIS — J449 Chronic obstructive pulmonary disease, unspecified: Secondary | ICD-10-CM | POA: Diagnosis not present

## 2021-08-26 NOTE — Patient Outreach (Signed)
  Care Coordination   08/26/2021 Name: [name] MRN: [id number] DOB: [birth year]   Care Coordination Outreach Attempts:  An unsuccessful telephone outreach was attempted today to offer the patient information about available care coordination services as a benefit of their health plan.   Follow Up Plan:  Additional outreach attempts will be made to offer the patient care coordination information and services.   Encounter Outcome:  No Answer  Care Coordination Interventions Activated:  No   Care Coordination Interventions:  No, not indicated    [facility] [phone]

## 2021-08-27 DIAGNOSIS — J9621 Acute and chronic respiratory failure with hypoxia: Secondary | ICD-10-CM | POA: Diagnosis not present

## 2021-08-27 DIAGNOSIS — F32A Depression, unspecified: Secondary | ICD-10-CM | POA: Diagnosis not present

## 2021-08-27 DIAGNOSIS — E1142 Type 2 diabetes mellitus with diabetic polyneuropathy: Secondary | ICD-10-CM | POA: Diagnosis not present

## 2021-08-27 DIAGNOSIS — J449 Chronic obstructive pulmonary disease, unspecified: Secondary | ICD-10-CM | POA: Diagnosis not present

## 2021-08-27 DIAGNOSIS — F17210 Nicotine dependence, cigarettes, uncomplicated: Secondary | ICD-10-CM | POA: Diagnosis not present

## 2021-08-28 DIAGNOSIS — J9621 Acute and chronic respiratory failure with hypoxia: Secondary | ICD-10-CM | POA: Diagnosis not present

## 2021-08-28 DIAGNOSIS — E1142 Type 2 diabetes mellitus with diabetic polyneuropathy: Secondary | ICD-10-CM | POA: Diagnosis not present

## 2021-08-28 DIAGNOSIS — F32A Depression, unspecified: Secondary | ICD-10-CM | POA: Diagnosis not present

## 2021-08-28 DIAGNOSIS — F17210 Nicotine dependence, cigarettes, uncomplicated: Secondary | ICD-10-CM | POA: Diagnosis not present

## 2021-08-28 DIAGNOSIS — J449 Chronic obstructive pulmonary disease, unspecified: Secondary | ICD-10-CM | POA: Diagnosis not present

## 2021-08-31 DIAGNOSIS — F17210 Nicotine dependence, cigarettes, uncomplicated: Secondary | ICD-10-CM | POA: Diagnosis not present

## 2021-08-31 DIAGNOSIS — F32A Depression, unspecified: Secondary | ICD-10-CM | POA: Diagnosis not present

## 2021-08-31 DIAGNOSIS — J9621 Acute and chronic respiratory failure with hypoxia: Secondary | ICD-10-CM | POA: Diagnosis not present

## 2021-08-31 DIAGNOSIS — E1142 Type 2 diabetes mellitus with diabetic polyneuropathy: Secondary | ICD-10-CM | POA: Diagnosis not present

## 2021-08-31 DIAGNOSIS — J449 Chronic obstructive pulmonary disease, unspecified: Secondary | ICD-10-CM | POA: Diagnosis not present

## 2021-09-02 DIAGNOSIS — F17210 Nicotine dependence, cigarettes, uncomplicated: Secondary | ICD-10-CM | POA: Diagnosis not present

## 2021-09-02 DIAGNOSIS — F32A Depression, unspecified: Secondary | ICD-10-CM | POA: Diagnosis not present

## 2021-09-02 DIAGNOSIS — E1142 Type 2 diabetes mellitus with diabetic polyneuropathy: Secondary | ICD-10-CM | POA: Diagnosis not present

## 2021-09-02 DIAGNOSIS — J9621 Acute and chronic respiratory failure with hypoxia: Secondary | ICD-10-CM | POA: Diagnosis not present

## 2021-09-02 DIAGNOSIS — J449 Chronic obstructive pulmonary disease, unspecified: Secondary | ICD-10-CM | POA: Diagnosis not present

## 2021-09-03 DIAGNOSIS — H401312 Pigmentary glaucoma, right eye, moderate stage: Secondary | ICD-10-CM | POA: Diagnosis not present

## 2021-09-07 DIAGNOSIS — J9621 Acute and chronic respiratory failure with hypoxia: Secondary | ICD-10-CM | POA: Diagnosis not present

## 2021-09-07 DIAGNOSIS — E1142 Type 2 diabetes mellitus with diabetic polyneuropathy: Secondary | ICD-10-CM | POA: Diagnosis not present

## 2021-09-07 DIAGNOSIS — J449 Chronic obstructive pulmonary disease, unspecified: Secondary | ICD-10-CM | POA: Diagnosis not present

## 2021-09-07 DIAGNOSIS — F17210 Nicotine dependence, cigarettes, uncomplicated: Secondary | ICD-10-CM | POA: Diagnosis not present

## 2021-09-07 DIAGNOSIS — F411 Generalized anxiety disorder: Secondary | ICD-10-CM | POA: Diagnosis not present

## 2021-09-07 DIAGNOSIS — F259 Schizoaffective disorder, unspecified: Secondary | ICD-10-CM | POA: Diagnosis not present

## 2021-09-07 DIAGNOSIS — F32A Depression, unspecified: Secondary | ICD-10-CM | POA: Diagnosis not present

## 2021-09-09 DIAGNOSIS — E1142 Type 2 diabetes mellitus with diabetic polyneuropathy: Secondary | ICD-10-CM | POA: Diagnosis not present

## 2021-09-09 DIAGNOSIS — F32A Depression, unspecified: Secondary | ICD-10-CM | POA: Diagnosis not present

## 2021-09-09 DIAGNOSIS — J449 Chronic obstructive pulmonary disease, unspecified: Secondary | ICD-10-CM | POA: Diagnosis not present

## 2021-09-09 DIAGNOSIS — J9621 Acute and chronic respiratory failure with hypoxia: Secondary | ICD-10-CM | POA: Diagnosis not present

## 2021-09-09 DIAGNOSIS — F17210 Nicotine dependence, cigarettes, uncomplicated: Secondary | ICD-10-CM | POA: Diagnosis not present

## 2021-09-10 DIAGNOSIS — F32A Depression, unspecified: Secondary | ICD-10-CM | POA: Diagnosis not present

## 2021-09-10 DIAGNOSIS — J9621 Acute and chronic respiratory failure with hypoxia: Secondary | ICD-10-CM | POA: Diagnosis not present

## 2021-09-10 DIAGNOSIS — J449 Chronic obstructive pulmonary disease, unspecified: Secondary | ICD-10-CM | POA: Diagnosis not present

## 2021-09-10 DIAGNOSIS — F17210 Nicotine dependence, cigarettes, uncomplicated: Secondary | ICD-10-CM | POA: Diagnosis not present

## 2021-09-10 DIAGNOSIS — E1142 Type 2 diabetes mellitus with diabetic polyneuropathy: Secondary | ICD-10-CM | POA: Diagnosis not present

## 2021-09-11 DIAGNOSIS — F17210 Nicotine dependence, cigarettes, uncomplicated: Secondary | ICD-10-CM | POA: Diagnosis not present

## 2021-09-11 DIAGNOSIS — J449 Chronic obstructive pulmonary disease, unspecified: Secondary | ICD-10-CM | POA: Diagnosis not present

## 2021-09-11 DIAGNOSIS — J9621 Acute and chronic respiratory failure with hypoxia: Secondary | ICD-10-CM | POA: Diagnosis not present

## 2021-09-11 DIAGNOSIS — E1142 Type 2 diabetes mellitus with diabetic polyneuropathy: Secondary | ICD-10-CM | POA: Diagnosis not present

## 2021-09-11 DIAGNOSIS — F32A Depression, unspecified: Secondary | ICD-10-CM | POA: Diagnosis not present

## 2021-09-14 DIAGNOSIS — F17210 Nicotine dependence, cigarettes, uncomplicated: Secondary | ICD-10-CM | POA: Diagnosis not present

## 2021-09-14 DIAGNOSIS — J449 Chronic obstructive pulmonary disease, unspecified: Secondary | ICD-10-CM | POA: Diagnosis not present

## 2021-09-14 DIAGNOSIS — E1142 Type 2 diabetes mellitus with diabetic polyneuropathy: Secondary | ICD-10-CM | POA: Diagnosis not present

## 2021-09-14 DIAGNOSIS — F32A Depression, unspecified: Secondary | ICD-10-CM | POA: Diagnosis not present

## 2021-09-14 DIAGNOSIS — J9621 Acute and chronic respiratory failure with hypoxia: Secondary | ICD-10-CM | POA: Diagnosis not present

## 2021-09-16 DIAGNOSIS — E1142 Type 2 diabetes mellitus with diabetic polyneuropathy: Secondary | ICD-10-CM | POA: Diagnosis not present

## 2021-09-16 DIAGNOSIS — F32A Depression, unspecified: Secondary | ICD-10-CM | POA: Diagnosis not present

## 2021-09-16 DIAGNOSIS — J449 Chronic obstructive pulmonary disease, unspecified: Secondary | ICD-10-CM | POA: Diagnosis not present

## 2021-09-16 DIAGNOSIS — J9621 Acute and chronic respiratory failure with hypoxia: Secondary | ICD-10-CM | POA: Diagnosis not present

## 2021-09-16 DIAGNOSIS — F17210 Nicotine dependence, cigarettes, uncomplicated: Secondary | ICD-10-CM | POA: Diagnosis not present

## 2021-09-17 DIAGNOSIS — E1142 Type 2 diabetes mellitus with diabetic polyneuropathy: Secondary | ICD-10-CM | POA: Diagnosis not present

## 2021-09-17 DIAGNOSIS — J449 Chronic obstructive pulmonary disease, unspecified: Secondary | ICD-10-CM | POA: Diagnosis not present

## 2021-09-17 DIAGNOSIS — F17210 Nicotine dependence, cigarettes, uncomplicated: Secondary | ICD-10-CM | POA: Diagnosis not present

## 2021-09-17 DIAGNOSIS — J9621 Acute and chronic respiratory failure with hypoxia: Secondary | ICD-10-CM | POA: Diagnosis not present

## 2021-09-17 DIAGNOSIS — F32A Depression, unspecified: Secondary | ICD-10-CM | POA: Diagnosis not present

## 2021-09-18 DIAGNOSIS — F17210 Nicotine dependence, cigarettes, uncomplicated: Secondary | ICD-10-CM | POA: Diagnosis not present

## 2021-09-18 DIAGNOSIS — J9621 Acute and chronic respiratory failure with hypoxia: Secondary | ICD-10-CM | POA: Diagnosis not present

## 2021-09-18 DIAGNOSIS — F32A Depression, unspecified: Secondary | ICD-10-CM | POA: Diagnosis not present

## 2021-09-18 DIAGNOSIS — J449 Chronic obstructive pulmonary disease, unspecified: Secondary | ICD-10-CM | POA: Diagnosis not present

## 2021-09-18 DIAGNOSIS — E1142 Type 2 diabetes mellitus with diabetic polyneuropathy: Secondary | ICD-10-CM | POA: Diagnosis not present

## 2021-09-21 DIAGNOSIS — J9621 Acute and chronic respiratory failure with hypoxia: Secondary | ICD-10-CM | POA: Diagnosis not present

## 2021-09-21 DIAGNOSIS — J449 Chronic obstructive pulmonary disease, unspecified: Secondary | ICD-10-CM | POA: Diagnosis not present

## 2021-09-21 DIAGNOSIS — E1142 Type 2 diabetes mellitus with diabetic polyneuropathy: Secondary | ICD-10-CM | POA: Diagnosis not present

## 2021-09-21 DIAGNOSIS — F17210 Nicotine dependence, cigarettes, uncomplicated: Secondary | ICD-10-CM | POA: Diagnosis not present

## 2021-09-21 DIAGNOSIS — F32A Depression, unspecified: Secondary | ICD-10-CM | POA: Diagnosis not present

## 2021-09-23 DIAGNOSIS — J9621 Acute and chronic respiratory failure with hypoxia: Secondary | ICD-10-CM | POA: Diagnosis not present

## 2021-09-23 DIAGNOSIS — F32A Depression, unspecified: Secondary | ICD-10-CM | POA: Diagnosis not present

## 2021-09-23 DIAGNOSIS — E1142 Type 2 diabetes mellitus with diabetic polyneuropathy: Secondary | ICD-10-CM | POA: Diagnosis not present

## 2021-09-23 DIAGNOSIS — F17210 Nicotine dependence, cigarettes, uncomplicated: Secondary | ICD-10-CM | POA: Diagnosis not present

## 2021-09-23 DIAGNOSIS — J449 Chronic obstructive pulmonary disease, unspecified: Secondary | ICD-10-CM | POA: Diagnosis not present

## 2021-09-24 DIAGNOSIS — J9621 Acute and chronic respiratory failure with hypoxia: Secondary | ICD-10-CM | POA: Diagnosis not present

## 2021-09-24 DIAGNOSIS — J449 Chronic obstructive pulmonary disease, unspecified: Secondary | ICD-10-CM | POA: Diagnosis not present

## 2021-09-24 DIAGNOSIS — E1142 Type 2 diabetes mellitus with diabetic polyneuropathy: Secondary | ICD-10-CM | POA: Diagnosis not present

## 2021-09-24 DIAGNOSIS — F32A Depression, unspecified: Secondary | ICD-10-CM | POA: Diagnosis not present

## 2021-09-24 DIAGNOSIS — F17210 Nicotine dependence, cigarettes, uncomplicated: Secondary | ICD-10-CM | POA: Diagnosis not present

## 2021-09-25 DIAGNOSIS — F32A Depression, unspecified: Secondary | ICD-10-CM | POA: Diagnosis not present

## 2021-09-25 DIAGNOSIS — I10 Essential (primary) hypertension: Secondary | ICD-10-CM | POA: Diagnosis not present

## 2021-09-25 DIAGNOSIS — Z9981 Dependence on supplemental oxygen: Secondary | ICD-10-CM | POA: Diagnosis not present

## 2021-09-25 DIAGNOSIS — Z91199 Patient's noncompliance with other medical treatment and regimen due to unspecified reason: Secondary | ICD-10-CM | POA: Diagnosis not present

## 2021-09-25 DIAGNOSIS — R251 Tremor, unspecified: Secondary | ICD-10-CM | POA: Diagnosis not present

## 2021-09-25 DIAGNOSIS — Z794 Long term (current) use of insulin: Secondary | ICD-10-CM | POA: Diagnosis not present

## 2021-09-25 DIAGNOSIS — Z741 Need for assistance with personal care: Secondary | ICD-10-CM | POA: Diagnosis not present

## 2021-09-25 DIAGNOSIS — J449 Chronic obstructive pulmonary disease, unspecified: Secondary | ICD-10-CM | POA: Diagnosis not present

## 2021-09-25 DIAGNOSIS — S4291XS Fracture of right shoulder girdle, part unspecified, sequela: Secondary | ICD-10-CM | POA: Diagnosis not present

## 2021-09-25 DIAGNOSIS — R159 Full incontinence of feces: Secondary | ICD-10-CM | POA: Diagnosis not present

## 2021-09-25 DIAGNOSIS — K5900 Constipation, unspecified: Secondary | ICD-10-CM | POA: Diagnosis not present

## 2021-09-25 DIAGNOSIS — Z6841 Body Mass Index (BMI) 40.0 and over, adult: Secondary | ICD-10-CM | POA: Diagnosis not present

## 2021-09-25 DIAGNOSIS — H409 Unspecified glaucoma: Secondary | ICD-10-CM | POA: Diagnosis not present

## 2021-09-25 DIAGNOSIS — R32 Unspecified urinary incontinence: Secondary | ICD-10-CM | POA: Diagnosis not present

## 2021-09-25 DIAGNOSIS — E1142 Type 2 diabetes mellitus with diabetic polyneuropathy: Secondary | ICD-10-CM | POA: Diagnosis not present

## 2021-09-25 DIAGNOSIS — F259 Schizoaffective disorder, unspecified: Secondary | ICD-10-CM | POA: Diagnosis not present

## 2021-09-25 DIAGNOSIS — J9621 Acute and chronic respiratory failure with hypoxia: Secondary | ICD-10-CM | POA: Diagnosis not present

## 2021-09-25 DIAGNOSIS — F17210 Nicotine dependence, cigarettes, uncomplicated: Secondary | ICD-10-CM | POA: Diagnosis not present

## 2021-09-28 DIAGNOSIS — J9621 Acute and chronic respiratory failure with hypoxia: Secondary | ICD-10-CM | POA: Diagnosis not present

## 2021-09-28 DIAGNOSIS — E1142 Type 2 diabetes mellitus with diabetic polyneuropathy: Secondary | ICD-10-CM | POA: Diagnosis not present

## 2021-09-28 DIAGNOSIS — F17210 Nicotine dependence, cigarettes, uncomplicated: Secondary | ICD-10-CM | POA: Diagnosis not present

## 2021-09-28 DIAGNOSIS — F32A Depression, unspecified: Secondary | ICD-10-CM | POA: Diagnosis not present

## 2021-09-28 DIAGNOSIS — J449 Chronic obstructive pulmonary disease, unspecified: Secondary | ICD-10-CM | POA: Diagnosis not present

## 2021-09-30 DIAGNOSIS — J449 Chronic obstructive pulmonary disease, unspecified: Secondary | ICD-10-CM | POA: Diagnosis not present

## 2021-09-30 DIAGNOSIS — J9621 Acute and chronic respiratory failure with hypoxia: Secondary | ICD-10-CM | POA: Diagnosis not present

## 2021-09-30 DIAGNOSIS — F17210 Nicotine dependence, cigarettes, uncomplicated: Secondary | ICD-10-CM | POA: Diagnosis not present

## 2021-09-30 DIAGNOSIS — F32A Depression, unspecified: Secondary | ICD-10-CM | POA: Diagnosis not present

## 2021-09-30 DIAGNOSIS — E1142 Type 2 diabetes mellitus with diabetic polyneuropathy: Secondary | ICD-10-CM | POA: Diagnosis not present

## 2021-10-01 DIAGNOSIS — F17210 Nicotine dependence, cigarettes, uncomplicated: Secondary | ICD-10-CM | POA: Diagnosis not present

## 2021-10-01 DIAGNOSIS — E1142 Type 2 diabetes mellitus with diabetic polyneuropathy: Secondary | ICD-10-CM | POA: Diagnosis not present

## 2021-10-01 DIAGNOSIS — J9621 Acute and chronic respiratory failure with hypoxia: Secondary | ICD-10-CM | POA: Diagnosis not present

## 2021-10-01 DIAGNOSIS — J449 Chronic obstructive pulmonary disease, unspecified: Secondary | ICD-10-CM | POA: Diagnosis not present

## 2021-10-01 DIAGNOSIS — F32A Depression, unspecified: Secondary | ICD-10-CM | POA: Diagnosis not present

## 2021-10-02 DIAGNOSIS — F17210 Nicotine dependence, cigarettes, uncomplicated: Secondary | ICD-10-CM | POA: Diagnosis not present

## 2021-10-02 DIAGNOSIS — J449 Chronic obstructive pulmonary disease, unspecified: Secondary | ICD-10-CM | POA: Diagnosis not present

## 2021-10-02 DIAGNOSIS — F32A Depression, unspecified: Secondary | ICD-10-CM | POA: Diagnosis not present

## 2021-10-02 DIAGNOSIS — J9621 Acute and chronic respiratory failure with hypoxia: Secondary | ICD-10-CM | POA: Diagnosis not present

## 2021-10-02 DIAGNOSIS — E1142 Type 2 diabetes mellitus with diabetic polyneuropathy: Secondary | ICD-10-CM | POA: Diagnosis not present

## 2021-10-05 DIAGNOSIS — F32A Depression, unspecified: Secondary | ICD-10-CM | POA: Diagnosis not present

## 2021-10-05 DIAGNOSIS — F17210 Nicotine dependence, cigarettes, uncomplicated: Secondary | ICD-10-CM | POA: Diagnosis not present

## 2021-10-05 DIAGNOSIS — J449 Chronic obstructive pulmonary disease, unspecified: Secondary | ICD-10-CM | POA: Diagnosis not present

## 2021-10-05 DIAGNOSIS — E1142 Type 2 diabetes mellitus with diabetic polyneuropathy: Secondary | ICD-10-CM | POA: Diagnosis not present

## 2021-10-05 DIAGNOSIS — J9621 Acute and chronic respiratory failure with hypoxia: Secondary | ICD-10-CM | POA: Diagnosis not present

## 2021-10-07 DIAGNOSIS — E1142 Type 2 diabetes mellitus with diabetic polyneuropathy: Secondary | ICD-10-CM | POA: Diagnosis not present

## 2021-10-07 DIAGNOSIS — J449 Chronic obstructive pulmonary disease, unspecified: Secondary | ICD-10-CM | POA: Diagnosis not present

## 2021-10-07 DIAGNOSIS — F32A Depression, unspecified: Secondary | ICD-10-CM | POA: Diagnosis not present

## 2021-10-07 DIAGNOSIS — J9621 Acute and chronic respiratory failure with hypoxia: Secondary | ICD-10-CM | POA: Diagnosis not present

## 2021-10-07 DIAGNOSIS — F17210 Nicotine dependence, cigarettes, uncomplicated: Secondary | ICD-10-CM | POA: Diagnosis not present

## 2021-10-08 DIAGNOSIS — E1142 Type 2 diabetes mellitus with diabetic polyneuropathy: Secondary | ICD-10-CM | POA: Diagnosis not present

## 2021-10-08 DIAGNOSIS — F32A Depression, unspecified: Secondary | ICD-10-CM | POA: Diagnosis not present

## 2021-10-08 DIAGNOSIS — J449 Chronic obstructive pulmonary disease, unspecified: Secondary | ICD-10-CM | POA: Diagnosis not present

## 2021-10-08 DIAGNOSIS — J9621 Acute and chronic respiratory failure with hypoxia: Secondary | ICD-10-CM | POA: Diagnosis not present

## 2021-10-08 DIAGNOSIS — F17210 Nicotine dependence, cigarettes, uncomplicated: Secondary | ICD-10-CM | POA: Diagnosis not present

## 2021-10-09 DIAGNOSIS — J449 Chronic obstructive pulmonary disease, unspecified: Secondary | ICD-10-CM | POA: Diagnosis not present

## 2021-10-09 DIAGNOSIS — F17210 Nicotine dependence, cigarettes, uncomplicated: Secondary | ICD-10-CM | POA: Diagnosis not present

## 2021-10-09 DIAGNOSIS — J9621 Acute and chronic respiratory failure with hypoxia: Secondary | ICD-10-CM | POA: Diagnosis not present

## 2021-10-09 DIAGNOSIS — F32A Depression, unspecified: Secondary | ICD-10-CM | POA: Diagnosis not present

## 2021-10-09 DIAGNOSIS — E1142 Type 2 diabetes mellitus with diabetic polyneuropathy: Secondary | ICD-10-CM | POA: Diagnosis not present

## 2021-10-12 DIAGNOSIS — F17210 Nicotine dependence, cigarettes, uncomplicated: Secondary | ICD-10-CM | POA: Diagnosis not present

## 2021-10-12 DIAGNOSIS — J9621 Acute and chronic respiratory failure with hypoxia: Secondary | ICD-10-CM | POA: Diagnosis not present

## 2021-10-12 DIAGNOSIS — J449 Chronic obstructive pulmonary disease, unspecified: Secondary | ICD-10-CM | POA: Diagnosis not present

## 2021-10-12 DIAGNOSIS — F32A Depression, unspecified: Secondary | ICD-10-CM | POA: Diagnosis not present

## 2021-10-12 DIAGNOSIS — E1142 Type 2 diabetes mellitus with diabetic polyneuropathy: Secondary | ICD-10-CM | POA: Diagnosis not present

## 2021-10-14 DIAGNOSIS — J9621 Acute and chronic respiratory failure with hypoxia: Secondary | ICD-10-CM | POA: Diagnosis not present

## 2021-10-14 DIAGNOSIS — J449 Chronic obstructive pulmonary disease, unspecified: Secondary | ICD-10-CM | POA: Diagnosis not present

## 2021-10-14 DIAGNOSIS — F17210 Nicotine dependence, cigarettes, uncomplicated: Secondary | ICD-10-CM | POA: Diagnosis not present

## 2021-10-14 DIAGNOSIS — F32A Depression, unspecified: Secondary | ICD-10-CM | POA: Diagnosis not present

## 2021-10-14 DIAGNOSIS — E1142 Type 2 diabetes mellitus with diabetic polyneuropathy: Secondary | ICD-10-CM | POA: Diagnosis not present

## 2021-10-15 DIAGNOSIS — E1142 Type 2 diabetes mellitus with diabetic polyneuropathy: Secondary | ICD-10-CM | POA: Diagnosis not present

## 2021-10-15 DIAGNOSIS — J9621 Acute and chronic respiratory failure with hypoxia: Secondary | ICD-10-CM | POA: Diagnosis not present

## 2021-10-15 DIAGNOSIS — J449 Chronic obstructive pulmonary disease, unspecified: Secondary | ICD-10-CM | POA: Diagnosis not present

## 2021-10-15 DIAGNOSIS — F32A Depression, unspecified: Secondary | ICD-10-CM | POA: Diagnosis not present

## 2021-10-15 DIAGNOSIS — F17210 Nicotine dependence, cigarettes, uncomplicated: Secondary | ICD-10-CM | POA: Diagnosis not present

## 2021-10-16 DIAGNOSIS — J449 Chronic obstructive pulmonary disease, unspecified: Secondary | ICD-10-CM | POA: Diagnosis not present

## 2021-10-16 DIAGNOSIS — E1142 Type 2 diabetes mellitus with diabetic polyneuropathy: Secondary | ICD-10-CM | POA: Diagnosis not present

## 2021-10-16 DIAGNOSIS — J9621 Acute and chronic respiratory failure with hypoxia: Secondary | ICD-10-CM | POA: Diagnosis not present

## 2021-10-16 DIAGNOSIS — F17210 Nicotine dependence, cigarettes, uncomplicated: Secondary | ICD-10-CM | POA: Diagnosis not present

## 2021-10-16 DIAGNOSIS — F32A Depression, unspecified: Secondary | ICD-10-CM | POA: Diagnosis not present

## 2021-10-19 DIAGNOSIS — J449 Chronic obstructive pulmonary disease, unspecified: Secondary | ICD-10-CM | POA: Diagnosis not present

## 2021-10-19 DIAGNOSIS — F17210 Nicotine dependence, cigarettes, uncomplicated: Secondary | ICD-10-CM | POA: Diagnosis not present

## 2021-10-19 DIAGNOSIS — J9621 Acute and chronic respiratory failure with hypoxia: Secondary | ICD-10-CM | POA: Diagnosis not present

## 2021-10-19 DIAGNOSIS — F32A Depression, unspecified: Secondary | ICD-10-CM | POA: Diagnosis not present

## 2021-10-19 DIAGNOSIS — E1142 Type 2 diabetes mellitus with diabetic polyneuropathy: Secondary | ICD-10-CM | POA: Diagnosis not present

## 2021-10-21 DIAGNOSIS — F32A Depression, unspecified: Secondary | ICD-10-CM | POA: Diagnosis not present

## 2021-10-21 DIAGNOSIS — F17210 Nicotine dependence, cigarettes, uncomplicated: Secondary | ICD-10-CM | POA: Diagnosis not present

## 2021-10-21 DIAGNOSIS — J449 Chronic obstructive pulmonary disease, unspecified: Secondary | ICD-10-CM | POA: Diagnosis not present

## 2021-10-21 DIAGNOSIS — J9621 Acute and chronic respiratory failure with hypoxia: Secondary | ICD-10-CM | POA: Diagnosis not present

## 2021-10-21 DIAGNOSIS — E1142 Type 2 diabetes mellitus with diabetic polyneuropathy: Secondary | ICD-10-CM | POA: Diagnosis not present

## 2021-10-22 DIAGNOSIS — J9621 Acute and chronic respiratory failure with hypoxia: Secondary | ICD-10-CM | POA: Diagnosis not present

## 2021-10-22 DIAGNOSIS — F32A Depression, unspecified: Secondary | ICD-10-CM | POA: Diagnosis not present

## 2021-10-22 DIAGNOSIS — F17210 Nicotine dependence, cigarettes, uncomplicated: Secondary | ICD-10-CM | POA: Diagnosis not present

## 2021-10-22 DIAGNOSIS — E1142 Type 2 diabetes mellitus with diabetic polyneuropathy: Secondary | ICD-10-CM | POA: Diagnosis not present

## 2021-10-22 DIAGNOSIS — J449 Chronic obstructive pulmonary disease, unspecified: Secondary | ICD-10-CM | POA: Diagnosis not present

## 2021-10-23 DIAGNOSIS — J449 Chronic obstructive pulmonary disease, unspecified: Secondary | ICD-10-CM | POA: Diagnosis not present

## 2021-10-23 DIAGNOSIS — J9621 Acute and chronic respiratory failure with hypoxia: Secondary | ICD-10-CM | POA: Diagnosis not present

## 2021-10-23 DIAGNOSIS — E1142 Type 2 diabetes mellitus with diabetic polyneuropathy: Secondary | ICD-10-CM | POA: Diagnosis not present

## 2021-10-23 DIAGNOSIS — F32A Depression, unspecified: Secondary | ICD-10-CM | POA: Diagnosis not present

## 2021-10-23 DIAGNOSIS — F17210 Nicotine dependence, cigarettes, uncomplicated: Secondary | ICD-10-CM | POA: Diagnosis not present

## 2021-10-25 DIAGNOSIS — F32A Depression, unspecified: Secondary | ICD-10-CM | POA: Diagnosis not present

## 2021-10-25 DIAGNOSIS — R159 Full incontinence of feces: Secondary | ICD-10-CM | POA: Diagnosis not present

## 2021-10-25 DIAGNOSIS — Z741 Need for assistance with personal care: Secondary | ICD-10-CM | POA: Diagnosis not present

## 2021-10-25 DIAGNOSIS — F259 Schizoaffective disorder, unspecified: Secondary | ICD-10-CM | POA: Diagnosis not present

## 2021-10-25 DIAGNOSIS — J449 Chronic obstructive pulmonary disease, unspecified: Secondary | ICD-10-CM | POA: Diagnosis not present

## 2021-10-25 DIAGNOSIS — F17210 Nicotine dependence, cigarettes, uncomplicated: Secondary | ICD-10-CM | POA: Diagnosis not present

## 2021-10-25 DIAGNOSIS — E1142 Type 2 diabetes mellitus with diabetic polyneuropathy: Secondary | ICD-10-CM | POA: Diagnosis not present

## 2021-10-25 DIAGNOSIS — S4291XS Fracture of right shoulder girdle, part unspecified, sequela: Secondary | ICD-10-CM | POA: Diagnosis not present

## 2021-10-25 DIAGNOSIS — Z9981 Dependence on supplemental oxygen: Secondary | ICD-10-CM | POA: Diagnosis not present

## 2021-10-25 DIAGNOSIS — R251 Tremor, unspecified: Secondary | ICD-10-CM | POA: Diagnosis not present

## 2021-10-25 DIAGNOSIS — H409 Unspecified glaucoma: Secondary | ICD-10-CM | POA: Diagnosis not present

## 2021-10-25 DIAGNOSIS — Z794 Long term (current) use of insulin: Secondary | ICD-10-CM | POA: Diagnosis not present

## 2021-10-25 DIAGNOSIS — Z91199 Patient's noncompliance with other medical treatment and regimen due to unspecified reason: Secondary | ICD-10-CM | POA: Diagnosis not present

## 2021-10-25 DIAGNOSIS — J9621 Acute and chronic respiratory failure with hypoxia: Secondary | ICD-10-CM | POA: Diagnosis not present

## 2021-10-25 DIAGNOSIS — R32 Unspecified urinary incontinence: Secondary | ICD-10-CM | POA: Diagnosis not present

## 2021-10-25 DIAGNOSIS — K5900 Constipation, unspecified: Secondary | ICD-10-CM | POA: Diagnosis not present

## 2021-10-25 DIAGNOSIS — Z6841 Body Mass Index (BMI) 40.0 and over, adult: Secondary | ICD-10-CM | POA: Diagnosis not present

## 2021-10-25 DIAGNOSIS — I10 Essential (primary) hypertension: Secondary | ICD-10-CM | POA: Diagnosis not present

## 2021-10-26 DIAGNOSIS — F17210 Nicotine dependence, cigarettes, uncomplicated: Secondary | ICD-10-CM | POA: Diagnosis not present

## 2021-10-26 DIAGNOSIS — J9621 Acute and chronic respiratory failure with hypoxia: Secondary | ICD-10-CM | POA: Diagnosis not present

## 2021-10-26 DIAGNOSIS — E1142 Type 2 diabetes mellitus with diabetic polyneuropathy: Secondary | ICD-10-CM | POA: Diagnosis not present

## 2021-10-26 DIAGNOSIS — J449 Chronic obstructive pulmonary disease, unspecified: Secondary | ICD-10-CM | POA: Diagnosis not present

## 2021-10-26 DIAGNOSIS — F32A Depression, unspecified: Secondary | ICD-10-CM | POA: Diagnosis not present

## 2021-10-28 DIAGNOSIS — E1142 Type 2 diabetes mellitus with diabetic polyneuropathy: Secondary | ICD-10-CM | POA: Diagnosis not present

## 2021-10-28 DIAGNOSIS — F32A Depression, unspecified: Secondary | ICD-10-CM | POA: Diagnosis not present

## 2021-10-28 DIAGNOSIS — J9621 Acute and chronic respiratory failure with hypoxia: Secondary | ICD-10-CM | POA: Diagnosis not present

## 2021-10-28 DIAGNOSIS — J449 Chronic obstructive pulmonary disease, unspecified: Secondary | ICD-10-CM | POA: Diagnosis not present

## 2021-10-28 DIAGNOSIS — F17210 Nicotine dependence, cigarettes, uncomplicated: Secondary | ICD-10-CM | POA: Diagnosis not present

## 2021-10-29 DIAGNOSIS — J9621 Acute and chronic respiratory failure with hypoxia: Secondary | ICD-10-CM | POA: Diagnosis not present

## 2021-10-29 DIAGNOSIS — E1142 Type 2 diabetes mellitus with diabetic polyneuropathy: Secondary | ICD-10-CM | POA: Diagnosis not present

## 2021-10-29 DIAGNOSIS — F17210 Nicotine dependence, cigarettes, uncomplicated: Secondary | ICD-10-CM | POA: Diagnosis not present

## 2021-10-29 DIAGNOSIS — F32A Depression, unspecified: Secondary | ICD-10-CM | POA: Diagnosis not present

## 2021-10-29 DIAGNOSIS — J449 Chronic obstructive pulmonary disease, unspecified: Secondary | ICD-10-CM | POA: Diagnosis not present

## 2021-10-30 DIAGNOSIS — F32A Depression, unspecified: Secondary | ICD-10-CM | POA: Diagnosis not present

## 2021-10-30 DIAGNOSIS — F251 Schizoaffective disorder, depressive type: Secondary | ICD-10-CM | POA: Diagnosis not present

## 2021-10-30 DIAGNOSIS — E1142 Type 2 diabetes mellitus with diabetic polyneuropathy: Secondary | ICD-10-CM | POA: Diagnosis not present

## 2021-10-30 DIAGNOSIS — F411 Generalized anxiety disorder: Secondary | ICD-10-CM | POA: Diagnosis not present

## 2021-10-30 DIAGNOSIS — F17210 Nicotine dependence, cigarettes, uncomplicated: Secondary | ICD-10-CM | POA: Diagnosis not present

## 2021-10-30 DIAGNOSIS — J9621 Acute and chronic respiratory failure with hypoxia: Secondary | ICD-10-CM | POA: Diagnosis not present

## 2021-10-30 DIAGNOSIS — J449 Chronic obstructive pulmonary disease, unspecified: Secondary | ICD-10-CM | POA: Diagnosis not present

## 2021-11-02 DIAGNOSIS — F32A Depression, unspecified: Secondary | ICD-10-CM | POA: Diagnosis not present

## 2021-11-02 DIAGNOSIS — E1142 Type 2 diabetes mellitus with diabetic polyneuropathy: Secondary | ICD-10-CM | POA: Diagnosis not present

## 2021-11-02 DIAGNOSIS — J449 Chronic obstructive pulmonary disease, unspecified: Secondary | ICD-10-CM | POA: Diagnosis not present

## 2021-11-02 DIAGNOSIS — J9621 Acute and chronic respiratory failure with hypoxia: Secondary | ICD-10-CM | POA: Diagnosis not present

## 2021-11-02 DIAGNOSIS — F17210 Nicotine dependence, cigarettes, uncomplicated: Secondary | ICD-10-CM | POA: Diagnosis not present

## 2021-11-04 DIAGNOSIS — J449 Chronic obstructive pulmonary disease, unspecified: Secondary | ICD-10-CM | POA: Diagnosis not present

## 2021-11-04 DIAGNOSIS — J9621 Acute and chronic respiratory failure with hypoxia: Secondary | ICD-10-CM | POA: Diagnosis not present

## 2021-11-04 DIAGNOSIS — F32A Depression, unspecified: Secondary | ICD-10-CM | POA: Diagnosis not present

## 2021-11-04 DIAGNOSIS — E1142 Type 2 diabetes mellitus with diabetic polyneuropathy: Secondary | ICD-10-CM | POA: Diagnosis not present

## 2021-11-04 DIAGNOSIS — F17210 Nicotine dependence, cigarettes, uncomplicated: Secondary | ICD-10-CM | POA: Diagnosis not present

## 2021-11-05 DIAGNOSIS — F17210 Nicotine dependence, cigarettes, uncomplicated: Secondary | ICD-10-CM | POA: Diagnosis not present

## 2021-11-05 DIAGNOSIS — E1142 Type 2 diabetes mellitus with diabetic polyneuropathy: Secondary | ICD-10-CM | POA: Diagnosis not present

## 2021-11-05 DIAGNOSIS — J9621 Acute and chronic respiratory failure with hypoxia: Secondary | ICD-10-CM | POA: Diagnosis not present

## 2021-11-05 DIAGNOSIS — J449 Chronic obstructive pulmonary disease, unspecified: Secondary | ICD-10-CM | POA: Diagnosis not present

## 2021-11-05 DIAGNOSIS — F32A Depression, unspecified: Secondary | ICD-10-CM | POA: Diagnosis not present

## 2021-11-06 ENCOUNTER — Telehealth: Payer: Self-pay

## 2021-11-06 DIAGNOSIS — F32A Depression, unspecified: Secondary | ICD-10-CM | POA: Diagnosis not present

## 2021-11-06 DIAGNOSIS — E1142 Type 2 diabetes mellitus with diabetic polyneuropathy: Secondary | ICD-10-CM | POA: Diagnosis not present

## 2021-11-06 DIAGNOSIS — F17210 Nicotine dependence, cigarettes, uncomplicated: Secondary | ICD-10-CM | POA: Diagnosis not present

## 2021-11-06 DIAGNOSIS — J9621 Acute and chronic respiratory failure with hypoxia: Secondary | ICD-10-CM | POA: Diagnosis not present

## 2021-11-06 DIAGNOSIS — J449 Chronic obstructive pulmonary disease, unspecified: Secondary | ICD-10-CM | POA: Diagnosis not present

## 2021-11-06 NOTE — Patient Outreach (Signed)
  Care Coordination   11/06/2021 Name: [name] MRN: [id number] DOB: 1945/10/01   Care Coordination Outreach Attempts:  A second unsuccessful outreach was attempted today to offer the patient with information about available care coordination services as a benefit of their health plan.     Follow Up Plan:  Additional outreach attempts will be made to offer the patient care coordination information and services.   Encounter Outcome:  No Answer  Care Coordination Interventions Activated:  No   Care Coordination Interventions:  No, not indicated    [facility] [phone]

## 2021-11-09 DIAGNOSIS — F17210 Nicotine dependence, cigarettes, uncomplicated: Secondary | ICD-10-CM | POA: Diagnosis not present

## 2021-11-09 DIAGNOSIS — J449 Chronic obstructive pulmonary disease, unspecified: Secondary | ICD-10-CM | POA: Diagnosis not present

## 2021-11-09 DIAGNOSIS — J9621 Acute and chronic respiratory failure with hypoxia: Secondary | ICD-10-CM | POA: Diagnosis not present

## 2021-11-09 DIAGNOSIS — F32A Depression, unspecified: Secondary | ICD-10-CM | POA: Diagnosis not present

## 2021-11-09 DIAGNOSIS — E1142 Type 2 diabetes mellitus with diabetic polyneuropathy: Secondary | ICD-10-CM | POA: Diagnosis not present

## 2021-11-11 DIAGNOSIS — E1142 Type 2 diabetes mellitus with diabetic polyneuropathy: Secondary | ICD-10-CM | POA: Diagnosis not present

## 2021-11-11 DIAGNOSIS — J449 Chronic obstructive pulmonary disease, unspecified: Secondary | ICD-10-CM | POA: Diagnosis not present

## 2021-11-11 DIAGNOSIS — F17210 Nicotine dependence, cigarettes, uncomplicated: Secondary | ICD-10-CM | POA: Diagnosis not present

## 2021-11-11 DIAGNOSIS — J9621 Acute and chronic respiratory failure with hypoxia: Secondary | ICD-10-CM | POA: Diagnosis not present

## 2021-11-11 DIAGNOSIS — F32A Depression, unspecified: Secondary | ICD-10-CM | POA: Diagnosis not present

## 2021-11-12 DIAGNOSIS — J449 Chronic obstructive pulmonary disease, unspecified: Secondary | ICD-10-CM | POA: Diagnosis not present

## 2021-11-12 DIAGNOSIS — E1142 Type 2 diabetes mellitus with diabetic polyneuropathy: Secondary | ICD-10-CM | POA: Diagnosis not present

## 2021-11-12 DIAGNOSIS — F32A Depression, unspecified: Secondary | ICD-10-CM | POA: Diagnosis not present

## 2021-11-12 DIAGNOSIS — F17210 Nicotine dependence, cigarettes, uncomplicated: Secondary | ICD-10-CM | POA: Diagnosis not present

## 2021-11-12 DIAGNOSIS — J9621 Acute and chronic respiratory failure with hypoxia: Secondary | ICD-10-CM | POA: Diagnosis not present

## 2021-11-16 DIAGNOSIS — J9621 Acute and chronic respiratory failure with hypoxia: Secondary | ICD-10-CM | POA: Diagnosis not present

## 2021-11-16 DIAGNOSIS — F32A Depression, unspecified: Secondary | ICD-10-CM | POA: Diagnosis not present

## 2021-11-16 DIAGNOSIS — F17210 Nicotine dependence, cigarettes, uncomplicated: Secondary | ICD-10-CM | POA: Diagnosis not present

## 2021-11-16 DIAGNOSIS — E1142 Type 2 diabetes mellitus with diabetic polyneuropathy: Secondary | ICD-10-CM | POA: Diagnosis not present

## 2021-11-16 DIAGNOSIS — J449 Chronic obstructive pulmonary disease, unspecified: Secondary | ICD-10-CM | POA: Diagnosis not present

## 2021-11-18 DIAGNOSIS — E1142 Type 2 diabetes mellitus with diabetic polyneuropathy: Secondary | ICD-10-CM | POA: Diagnosis not present

## 2021-11-18 DIAGNOSIS — J449 Chronic obstructive pulmonary disease, unspecified: Secondary | ICD-10-CM | POA: Diagnosis not present

## 2021-11-18 DIAGNOSIS — F17210 Nicotine dependence, cigarettes, uncomplicated: Secondary | ICD-10-CM | POA: Diagnosis not present

## 2021-11-18 DIAGNOSIS — F32A Depression, unspecified: Secondary | ICD-10-CM | POA: Diagnosis not present

## 2021-11-18 DIAGNOSIS — J9621 Acute and chronic respiratory failure with hypoxia: Secondary | ICD-10-CM | POA: Diagnosis not present

## 2021-11-19 DIAGNOSIS — E1142 Type 2 diabetes mellitus with diabetic polyneuropathy: Secondary | ICD-10-CM | POA: Diagnosis not present

## 2021-11-19 DIAGNOSIS — J9621 Acute and chronic respiratory failure with hypoxia: Secondary | ICD-10-CM | POA: Diagnosis not present

## 2021-11-19 DIAGNOSIS — J449 Chronic obstructive pulmonary disease, unspecified: Secondary | ICD-10-CM | POA: Diagnosis not present

## 2021-11-19 DIAGNOSIS — F32A Depression, unspecified: Secondary | ICD-10-CM | POA: Diagnosis not present

## 2021-11-19 DIAGNOSIS — F17210 Nicotine dependence, cigarettes, uncomplicated: Secondary | ICD-10-CM | POA: Diagnosis not present

## 2021-11-20 DIAGNOSIS — J449 Chronic obstructive pulmonary disease, unspecified: Secondary | ICD-10-CM | POA: Diagnosis not present

## 2021-11-20 DIAGNOSIS — J9621 Acute and chronic respiratory failure with hypoxia: Secondary | ICD-10-CM | POA: Diagnosis not present

## 2021-11-20 DIAGNOSIS — E1142 Type 2 diabetes mellitus with diabetic polyneuropathy: Secondary | ICD-10-CM | POA: Diagnosis not present

## 2021-11-20 DIAGNOSIS — F17210 Nicotine dependence, cigarettes, uncomplicated: Secondary | ICD-10-CM | POA: Diagnosis not present

## 2021-11-20 DIAGNOSIS — F32A Depression, unspecified: Secondary | ICD-10-CM | POA: Diagnosis not present

## 2021-11-23 DIAGNOSIS — J449 Chronic obstructive pulmonary disease, unspecified: Secondary | ICD-10-CM | POA: Diagnosis not present

## 2021-11-23 DIAGNOSIS — E1142 Type 2 diabetes mellitus with diabetic polyneuropathy: Secondary | ICD-10-CM | POA: Diagnosis not present

## 2021-11-23 DIAGNOSIS — J9621 Acute and chronic respiratory failure with hypoxia: Secondary | ICD-10-CM | POA: Diagnosis not present

## 2021-11-23 DIAGNOSIS — F17210 Nicotine dependence, cigarettes, uncomplicated: Secondary | ICD-10-CM | POA: Diagnosis not present

## 2021-11-23 DIAGNOSIS — F32A Depression, unspecified: Secondary | ICD-10-CM | POA: Diagnosis not present

## 2021-11-25 DIAGNOSIS — Z741 Need for assistance with personal care: Secondary | ICD-10-CM | POA: Diagnosis not present

## 2021-11-25 DIAGNOSIS — S4291XS Fracture of right shoulder girdle, part unspecified, sequela: Secondary | ICD-10-CM | POA: Diagnosis not present

## 2021-11-25 DIAGNOSIS — K5900 Constipation, unspecified: Secondary | ICD-10-CM | POA: Diagnosis not present

## 2021-11-25 DIAGNOSIS — R32 Unspecified urinary incontinence: Secondary | ICD-10-CM | POA: Diagnosis not present

## 2021-11-25 DIAGNOSIS — I10 Essential (primary) hypertension: Secondary | ICD-10-CM | POA: Diagnosis not present

## 2021-11-25 DIAGNOSIS — Z6841 Body Mass Index (BMI) 40.0 and over, adult: Secondary | ICD-10-CM | POA: Diagnosis not present

## 2021-11-25 DIAGNOSIS — Z9981 Dependence on supplemental oxygen: Secondary | ICD-10-CM | POA: Diagnosis not present

## 2021-11-25 DIAGNOSIS — E1142 Type 2 diabetes mellitus with diabetic polyneuropathy: Secondary | ICD-10-CM | POA: Diagnosis not present

## 2021-11-25 DIAGNOSIS — R251 Tremor, unspecified: Secondary | ICD-10-CM | POA: Diagnosis not present

## 2021-11-25 DIAGNOSIS — H409 Unspecified glaucoma: Secondary | ICD-10-CM | POA: Diagnosis not present

## 2021-11-25 DIAGNOSIS — Z794 Long term (current) use of insulin: Secondary | ICD-10-CM | POA: Diagnosis not present

## 2021-11-25 DIAGNOSIS — J9621 Acute and chronic respiratory failure with hypoxia: Secondary | ICD-10-CM | POA: Diagnosis not present

## 2021-11-25 DIAGNOSIS — Z91199 Patient's noncompliance with other medical treatment and regimen due to unspecified reason: Secondary | ICD-10-CM | POA: Diagnosis not present

## 2021-11-25 DIAGNOSIS — F17210 Nicotine dependence, cigarettes, uncomplicated: Secondary | ICD-10-CM | POA: Diagnosis not present

## 2021-11-25 DIAGNOSIS — R159 Full incontinence of feces: Secondary | ICD-10-CM | POA: Diagnosis not present

## 2021-11-25 DIAGNOSIS — F32A Depression, unspecified: Secondary | ICD-10-CM | POA: Diagnosis not present

## 2021-11-25 DIAGNOSIS — F259 Schizoaffective disorder, unspecified: Secondary | ICD-10-CM | POA: Diagnosis not present

## 2021-11-25 DIAGNOSIS — J449 Chronic obstructive pulmonary disease, unspecified: Secondary | ICD-10-CM | POA: Diagnosis not present

## 2021-11-26 DIAGNOSIS — J449 Chronic obstructive pulmonary disease, unspecified: Secondary | ICD-10-CM | POA: Diagnosis not present

## 2021-11-26 DIAGNOSIS — J9621 Acute and chronic respiratory failure with hypoxia: Secondary | ICD-10-CM | POA: Diagnosis not present

## 2021-11-26 DIAGNOSIS — F17210 Nicotine dependence, cigarettes, uncomplicated: Secondary | ICD-10-CM | POA: Diagnosis not present

## 2021-11-26 DIAGNOSIS — E1142 Type 2 diabetes mellitus with diabetic polyneuropathy: Secondary | ICD-10-CM | POA: Diagnosis not present

## 2021-11-26 DIAGNOSIS — F32A Depression, unspecified: Secondary | ICD-10-CM | POA: Diagnosis not present

## 2021-11-30 DIAGNOSIS — J9621 Acute and chronic respiratory failure with hypoxia: Secondary | ICD-10-CM | POA: Diagnosis not present

## 2021-11-30 DIAGNOSIS — F32A Depression, unspecified: Secondary | ICD-10-CM | POA: Diagnosis not present

## 2021-11-30 DIAGNOSIS — E1142 Type 2 diabetes mellitus with diabetic polyneuropathy: Secondary | ICD-10-CM | POA: Diagnosis not present

## 2021-11-30 DIAGNOSIS — J449 Chronic obstructive pulmonary disease, unspecified: Secondary | ICD-10-CM | POA: Diagnosis not present

## 2021-11-30 DIAGNOSIS — F17210 Nicotine dependence, cigarettes, uncomplicated: Secondary | ICD-10-CM | POA: Diagnosis not present

## 2021-12-02 DIAGNOSIS — E1142 Type 2 diabetes mellitus with diabetic polyneuropathy: Secondary | ICD-10-CM | POA: Diagnosis not present

## 2021-12-02 DIAGNOSIS — J449 Chronic obstructive pulmonary disease, unspecified: Secondary | ICD-10-CM | POA: Diagnosis not present

## 2021-12-02 DIAGNOSIS — F32A Depression, unspecified: Secondary | ICD-10-CM | POA: Diagnosis not present

## 2021-12-02 DIAGNOSIS — J9621 Acute and chronic respiratory failure with hypoxia: Secondary | ICD-10-CM | POA: Diagnosis not present

## 2021-12-02 DIAGNOSIS — F17210 Nicotine dependence, cigarettes, uncomplicated: Secondary | ICD-10-CM | POA: Diagnosis not present

## 2021-12-03 DIAGNOSIS — F32A Depression, unspecified: Secondary | ICD-10-CM | POA: Diagnosis not present

## 2021-12-03 DIAGNOSIS — F17210 Nicotine dependence, cigarettes, uncomplicated: Secondary | ICD-10-CM | POA: Diagnosis not present

## 2021-12-03 DIAGNOSIS — E1142 Type 2 diabetes mellitus with diabetic polyneuropathy: Secondary | ICD-10-CM | POA: Diagnosis not present

## 2021-12-03 DIAGNOSIS — J9621 Acute and chronic respiratory failure with hypoxia: Secondary | ICD-10-CM | POA: Diagnosis not present

## 2021-12-03 DIAGNOSIS — J449 Chronic obstructive pulmonary disease, unspecified: Secondary | ICD-10-CM | POA: Diagnosis not present

## 2021-12-04 DIAGNOSIS — E1142 Type 2 diabetes mellitus with diabetic polyneuropathy: Secondary | ICD-10-CM | POA: Diagnosis not present

## 2021-12-04 DIAGNOSIS — J9621 Acute and chronic respiratory failure with hypoxia: Secondary | ICD-10-CM | POA: Diagnosis not present

## 2021-12-04 DIAGNOSIS — F32A Depression, unspecified: Secondary | ICD-10-CM | POA: Diagnosis not present

## 2021-12-04 DIAGNOSIS — F17210 Nicotine dependence, cigarettes, uncomplicated: Secondary | ICD-10-CM | POA: Diagnosis not present

## 2021-12-04 DIAGNOSIS — J449 Chronic obstructive pulmonary disease, unspecified: Secondary | ICD-10-CM | POA: Diagnosis not present

## 2021-12-05 DIAGNOSIS — F17210 Nicotine dependence, cigarettes, uncomplicated: Secondary | ICD-10-CM | POA: Diagnosis not present

## 2021-12-05 DIAGNOSIS — E1142 Type 2 diabetes mellitus with diabetic polyneuropathy: Secondary | ICD-10-CM | POA: Diagnosis not present

## 2021-12-05 DIAGNOSIS — J9621 Acute and chronic respiratory failure with hypoxia: Secondary | ICD-10-CM | POA: Diagnosis not present

## 2021-12-05 DIAGNOSIS — F32A Depression, unspecified: Secondary | ICD-10-CM | POA: Diagnosis not present

## 2021-12-05 DIAGNOSIS — J449 Chronic obstructive pulmonary disease, unspecified: Secondary | ICD-10-CM | POA: Diagnosis not present

## 2021-12-06 DIAGNOSIS — F32A Depression, unspecified: Secondary | ICD-10-CM | POA: Diagnosis not present

## 2021-12-06 DIAGNOSIS — F17210 Nicotine dependence, cigarettes, uncomplicated: Secondary | ICD-10-CM | POA: Diagnosis not present

## 2021-12-06 DIAGNOSIS — J9621 Acute and chronic respiratory failure with hypoxia: Secondary | ICD-10-CM | POA: Diagnosis not present

## 2021-12-06 DIAGNOSIS — E1142 Type 2 diabetes mellitus with diabetic polyneuropathy: Secondary | ICD-10-CM | POA: Diagnosis not present

## 2021-12-06 DIAGNOSIS — J449 Chronic obstructive pulmonary disease, unspecified: Secondary | ICD-10-CM | POA: Diagnosis not present

## 2021-12-07 DIAGNOSIS — F17210 Nicotine dependence, cigarettes, uncomplicated: Secondary | ICD-10-CM | POA: Diagnosis not present

## 2021-12-07 DIAGNOSIS — J449 Chronic obstructive pulmonary disease, unspecified: Secondary | ICD-10-CM | POA: Diagnosis not present

## 2021-12-07 DIAGNOSIS — J9621 Acute and chronic respiratory failure with hypoxia: Secondary | ICD-10-CM | POA: Diagnosis not present

## 2021-12-07 DIAGNOSIS — F32A Depression, unspecified: Secondary | ICD-10-CM | POA: Diagnosis not present

## 2021-12-07 DIAGNOSIS — E1142 Type 2 diabetes mellitus with diabetic polyneuropathy: Secondary | ICD-10-CM | POA: Diagnosis not present

## 2021-12-09 DIAGNOSIS — E1142 Type 2 diabetes mellitus with diabetic polyneuropathy: Secondary | ICD-10-CM | POA: Diagnosis not present

## 2021-12-09 DIAGNOSIS — F32A Depression, unspecified: Secondary | ICD-10-CM | POA: Diagnosis not present

## 2021-12-09 DIAGNOSIS — F17210 Nicotine dependence, cigarettes, uncomplicated: Secondary | ICD-10-CM | POA: Diagnosis not present

## 2021-12-09 DIAGNOSIS — J9621 Acute and chronic respiratory failure with hypoxia: Secondary | ICD-10-CM | POA: Diagnosis not present

## 2021-12-09 DIAGNOSIS — J449 Chronic obstructive pulmonary disease, unspecified: Secondary | ICD-10-CM | POA: Diagnosis not present

## 2021-12-10 DIAGNOSIS — F32A Depression, unspecified: Secondary | ICD-10-CM | POA: Diagnosis not present

## 2021-12-10 DIAGNOSIS — F17210 Nicotine dependence, cigarettes, uncomplicated: Secondary | ICD-10-CM | POA: Diagnosis not present

## 2021-12-10 DIAGNOSIS — J9621 Acute and chronic respiratory failure with hypoxia: Secondary | ICD-10-CM | POA: Diagnosis not present

## 2021-12-10 DIAGNOSIS — E1142 Type 2 diabetes mellitus with diabetic polyneuropathy: Secondary | ICD-10-CM | POA: Diagnosis not present

## 2021-12-10 DIAGNOSIS — J449 Chronic obstructive pulmonary disease, unspecified: Secondary | ICD-10-CM | POA: Diagnosis not present

## 2021-12-11 DIAGNOSIS — E1142 Type 2 diabetes mellitus with diabetic polyneuropathy: Secondary | ICD-10-CM | POA: Diagnosis not present

## 2021-12-11 DIAGNOSIS — F17210 Nicotine dependence, cigarettes, uncomplicated: Secondary | ICD-10-CM | POA: Diagnosis not present

## 2021-12-11 DIAGNOSIS — F32A Depression, unspecified: Secondary | ICD-10-CM | POA: Diagnosis not present

## 2021-12-11 DIAGNOSIS — J9621 Acute and chronic respiratory failure with hypoxia: Secondary | ICD-10-CM | POA: Diagnosis not present

## 2021-12-11 DIAGNOSIS — J449 Chronic obstructive pulmonary disease, unspecified: Secondary | ICD-10-CM | POA: Diagnosis not present

## 2021-12-14 DIAGNOSIS — J449 Chronic obstructive pulmonary disease, unspecified: Secondary | ICD-10-CM | POA: Diagnosis not present

## 2021-12-14 DIAGNOSIS — F32A Depression, unspecified: Secondary | ICD-10-CM | POA: Diagnosis not present

## 2021-12-14 DIAGNOSIS — E1142 Type 2 diabetes mellitus with diabetic polyneuropathy: Secondary | ICD-10-CM | POA: Diagnosis not present

## 2021-12-14 DIAGNOSIS — J9621 Acute and chronic respiratory failure with hypoxia: Secondary | ICD-10-CM | POA: Diagnosis not present

## 2021-12-14 DIAGNOSIS — F17210 Nicotine dependence, cigarettes, uncomplicated: Secondary | ICD-10-CM | POA: Diagnosis not present

## 2021-12-15 DIAGNOSIS — F17210 Nicotine dependence, cigarettes, uncomplicated: Secondary | ICD-10-CM | POA: Diagnosis not present

## 2021-12-15 DIAGNOSIS — J9621 Acute and chronic respiratory failure with hypoxia: Secondary | ICD-10-CM | POA: Diagnosis not present

## 2021-12-15 DIAGNOSIS — E1142 Type 2 diabetes mellitus with diabetic polyneuropathy: Secondary | ICD-10-CM | POA: Diagnosis not present

## 2021-12-15 DIAGNOSIS — F32A Depression, unspecified: Secondary | ICD-10-CM | POA: Diagnosis not present

## 2021-12-15 DIAGNOSIS — J449 Chronic obstructive pulmonary disease, unspecified: Secondary | ICD-10-CM | POA: Diagnosis not present

## 2021-12-16 DIAGNOSIS — E1142 Type 2 diabetes mellitus with diabetic polyneuropathy: Secondary | ICD-10-CM | POA: Diagnosis not present

## 2021-12-16 DIAGNOSIS — F17210 Nicotine dependence, cigarettes, uncomplicated: Secondary | ICD-10-CM | POA: Diagnosis not present

## 2021-12-16 DIAGNOSIS — J449 Chronic obstructive pulmonary disease, unspecified: Secondary | ICD-10-CM | POA: Diagnosis not present

## 2021-12-16 DIAGNOSIS — J9621 Acute and chronic respiratory failure with hypoxia: Secondary | ICD-10-CM | POA: Diagnosis not present

## 2021-12-16 DIAGNOSIS — F32A Depression, unspecified: Secondary | ICD-10-CM | POA: Diagnosis not present

## 2021-12-16 DIAGNOSIS — H401312 Pigmentary glaucoma, right eye, moderate stage: Secondary | ICD-10-CM | POA: Diagnosis not present

## 2021-12-18 DIAGNOSIS — J9621 Acute and chronic respiratory failure with hypoxia: Secondary | ICD-10-CM | POA: Diagnosis not present

## 2021-12-18 DIAGNOSIS — E1142 Type 2 diabetes mellitus with diabetic polyneuropathy: Secondary | ICD-10-CM | POA: Diagnosis not present

## 2021-12-18 DIAGNOSIS — F17210 Nicotine dependence, cigarettes, uncomplicated: Secondary | ICD-10-CM | POA: Diagnosis not present

## 2021-12-18 DIAGNOSIS — F32A Depression, unspecified: Secondary | ICD-10-CM | POA: Diagnosis not present

## 2021-12-18 DIAGNOSIS — J449 Chronic obstructive pulmonary disease, unspecified: Secondary | ICD-10-CM | POA: Diagnosis not present

## 2021-12-21 DIAGNOSIS — E1142 Type 2 diabetes mellitus with diabetic polyneuropathy: Secondary | ICD-10-CM | POA: Diagnosis not present

## 2021-12-21 DIAGNOSIS — J9621 Acute and chronic respiratory failure with hypoxia: Secondary | ICD-10-CM | POA: Diagnosis not present

## 2021-12-21 DIAGNOSIS — J449 Chronic obstructive pulmonary disease, unspecified: Secondary | ICD-10-CM | POA: Diagnosis not present

## 2021-12-21 DIAGNOSIS — F32A Depression, unspecified: Secondary | ICD-10-CM | POA: Diagnosis not present

## 2021-12-21 DIAGNOSIS — F17210 Nicotine dependence, cigarettes, uncomplicated: Secondary | ICD-10-CM | POA: Diagnosis not present

## 2021-12-23 DIAGNOSIS — J9621 Acute and chronic respiratory failure with hypoxia: Secondary | ICD-10-CM | POA: Diagnosis not present

## 2021-12-23 DIAGNOSIS — F17210 Nicotine dependence, cigarettes, uncomplicated: Secondary | ICD-10-CM | POA: Diagnosis not present

## 2021-12-23 DIAGNOSIS — J449 Chronic obstructive pulmonary disease, unspecified: Secondary | ICD-10-CM | POA: Diagnosis not present

## 2021-12-23 DIAGNOSIS — E1142 Type 2 diabetes mellitus with diabetic polyneuropathy: Secondary | ICD-10-CM | POA: Diagnosis not present

## 2021-12-23 DIAGNOSIS — F32A Depression, unspecified: Secondary | ICD-10-CM | POA: Diagnosis not present

## 2021-12-24 DIAGNOSIS — F32A Depression, unspecified: Secondary | ICD-10-CM | POA: Diagnosis not present

## 2021-12-24 DIAGNOSIS — J449 Chronic obstructive pulmonary disease, unspecified: Secondary | ICD-10-CM | POA: Diagnosis not present

## 2021-12-24 DIAGNOSIS — J9621 Acute and chronic respiratory failure with hypoxia: Secondary | ICD-10-CM | POA: Diagnosis not present

## 2021-12-24 DIAGNOSIS — F17210 Nicotine dependence, cigarettes, uncomplicated: Secondary | ICD-10-CM | POA: Diagnosis not present

## 2021-12-24 DIAGNOSIS — E1142 Type 2 diabetes mellitus with diabetic polyneuropathy: Secondary | ICD-10-CM | POA: Diagnosis not present

## 2021-12-25 DIAGNOSIS — J449 Chronic obstructive pulmonary disease, unspecified: Secondary | ICD-10-CM | POA: Diagnosis not present

## 2021-12-25 DIAGNOSIS — R32 Unspecified urinary incontinence: Secondary | ICD-10-CM | POA: Diagnosis not present

## 2021-12-25 DIAGNOSIS — H409 Unspecified glaucoma: Secondary | ICD-10-CM | POA: Diagnosis not present

## 2021-12-25 DIAGNOSIS — Z741 Need for assistance with personal care: Secondary | ICD-10-CM | POA: Diagnosis not present

## 2021-12-25 DIAGNOSIS — F259 Schizoaffective disorder, unspecified: Secondary | ICD-10-CM | POA: Diagnosis not present

## 2021-12-25 DIAGNOSIS — Z91199 Patient's noncompliance with other medical treatment and regimen due to unspecified reason: Secondary | ICD-10-CM | POA: Diagnosis not present

## 2021-12-25 DIAGNOSIS — R159 Full incontinence of feces: Secondary | ICD-10-CM | POA: Diagnosis not present

## 2021-12-25 DIAGNOSIS — K5900 Constipation, unspecified: Secondary | ICD-10-CM | POA: Diagnosis not present

## 2021-12-25 DIAGNOSIS — Z6841 Body Mass Index (BMI) 40.0 and over, adult: Secondary | ICD-10-CM | POA: Diagnosis not present

## 2021-12-25 DIAGNOSIS — Z9981 Dependence on supplemental oxygen: Secondary | ICD-10-CM | POA: Diagnosis not present

## 2021-12-25 DIAGNOSIS — F32A Depression, unspecified: Secondary | ICD-10-CM | POA: Diagnosis not present

## 2021-12-25 DIAGNOSIS — Z794 Long term (current) use of insulin: Secondary | ICD-10-CM | POA: Diagnosis not present

## 2021-12-25 DIAGNOSIS — F17210 Nicotine dependence, cigarettes, uncomplicated: Secondary | ICD-10-CM | POA: Diagnosis not present

## 2021-12-25 DIAGNOSIS — J9621 Acute and chronic respiratory failure with hypoxia: Secondary | ICD-10-CM | POA: Diagnosis not present

## 2021-12-25 DIAGNOSIS — S4291XS Fracture of right shoulder girdle, part unspecified, sequela: Secondary | ICD-10-CM | POA: Diagnosis not present

## 2021-12-25 DIAGNOSIS — I10 Essential (primary) hypertension: Secondary | ICD-10-CM | POA: Diagnosis not present

## 2021-12-25 DIAGNOSIS — E1142 Type 2 diabetes mellitus with diabetic polyneuropathy: Secondary | ICD-10-CM | POA: Diagnosis not present

## 2021-12-25 DIAGNOSIS — R251 Tremor, unspecified: Secondary | ICD-10-CM | POA: Diagnosis not present

## 2021-12-28 DIAGNOSIS — F17210 Nicotine dependence, cigarettes, uncomplicated: Secondary | ICD-10-CM | POA: Diagnosis not present

## 2021-12-28 DIAGNOSIS — J449 Chronic obstructive pulmonary disease, unspecified: Secondary | ICD-10-CM | POA: Diagnosis not present

## 2021-12-28 DIAGNOSIS — F32A Depression, unspecified: Secondary | ICD-10-CM | POA: Diagnosis not present

## 2021-12-28 DIAGNOSIS — J9621 Acute and chronic respiratory failure with hypoxia: Secondary | ICD-10-CM | POA: Diagnosis not present

## 2021-12-28 DIAGNOSIS — E1142 Type 2 diabetes mellitus with diabetic polyneuropathy: Secondary | ICD-10-CM | POA: Diagnosis not present

## 2021-12-30 DIAGNOSIS — F32A Depression, unspecified: Secondary | ICD-10-CM | POA: Diagnosis not present

## 2021-12-30 DIAGNOSIS — E1142 Type 2 diabetes mellitus with diabetic polyneuropathy: Secondary | ICD-10-CM | POA: Diagnosis not present

## 2021-12-30 DIAGNOSIS — F17210 Nicotine dependence, cigarettes, uncomplicated: Secondary | ICD-10-CM | POA: Diagnosis not present

## 2021-12-30 DIAGNOSIS — J449 Chronic obstructive pulmonary disease, unspecified: Secondary | ICD-10-CM | POA: Diagnosis not present

## 2021-12-30 DIAGNOSIS — J9621 Acute and chronic respiratory failure with hypoxia: Secondary | ICD-10-CM | POA: Diagnosis not present

## 2021-12-31 DIAGNOSIS — E1142 Type 2 diabetes mellitus with diabetic polyneuropathy: Secondary | ICD-10-CM | POA: Diagnosis not present

## 2021-12-31 DIAGNOSIS — F32A Depression, unspecified: Secondary | ICD-10-CM | POA: Diagnosis not present

## 2021-12-31 DIAGNOSIS — J449 Chronic obstructive pulmonary disease, unspecified: Secondary | ICD-10-CM | POA: Diagnosis not present

## 2021-12-31 DIAGNOSIS — F17210 Nicotine dependence, cigarettes, uncomplicated: Secondary | ICD-10-CM | POA: Diagnosis not present

## 2021-12-31 DIAGNOSIS — J9621 Acute and chronic respiratory failure with hypoxia: Secondary | ICD-10-CM | POA: Diagnosis not present

## 2022-01-01 DIAGNOSIS — J9621 Acute and chronic respiratory failure with hypoxia: Secondary | ICD-10-CM | POA: Diagnosis not present

## 2022-01-01 DIAGNOSIS — F17210 Nicotine dependence, cigarettes, uncomplicated: Secondary | ICD-10-CM | POA: Diagnosis not present

## 2022-01-01 DIAGNOSIS — E1142 Type 2 diabetes mellitus with diabetic polyneuropathy: Secondary | ICD-10-CM | POA: Diagnosis not present

## 2022-01-01 DIAGNOSIS — J449 Chronic obstructive pulmonary disease, unspecified: Secondary | ICD-10-CM | POA: Diagnosis not present

## 2022-01-01 DIAGNOSIS — F32A Depression, unspecified: Secondary | ICD-10-CM | POA: Diagnosis not present

## 2022-01-04 DIAGNOSIS — F17210 Nicotine dependence, cigarettes, uncomplicated: Secondary | ICD-10-CM | POA: Diagnosis not present

## 2022-01-04 DIAGNOSIS — J449 Chronic obstructive pulmonary disease, unspecified: Secondary | ICD-10-CM | POA: Diagnosis not present

## 2022-01-04 DIAGNOSIS — E1142 Type 2 diabetes mellitus with diabetic polyneuropathy: Secondary | ICD-10-CM | POA: Diagnosis not present

## 2022-01-04 DIAGNOSIS — F32A Depression, unspecified: Secondary | ICD-10-CM | POA: Diagnosis not present

## 2022-01-04 DIAGNOSIS — J9621 Acute and chronic respiratory failure with hypoxia: Secondary | ICD-10-CM | POA: Diagnosis not present

## 2022-01-06 DIAGNOSIS — F17210 Nicotine dependence, cigarettes, uncomplicated: Secondary | ICD-10-CM | POA: Diagnosis not present

## 2022-01-06 DIAGNOSIS — J449 Chronic obstructive pulmonary disease, unspecified: Secondary | ICD-10-CM | POA: Diagnosis not present

## 2022-01-06 DIAGNOSIS — J9621 Acute and chronic respiratory failure with hypoxia: Secondary | ICD-10-CM | POA: Diagnosis not present

## 2022-01-06 DIAGNOSIS — E1142 Type 2 diabetes mellitus with diabetic polyneuropathy: Secondary | ICD-10-CM | POA: Diagnosis not present

## 2022-01-06 DIAGNOSIS — F32A Depression, unspecified: Secondary | ICD-10-CM | POA: Diagnosis not present

## 2022-01-07 DIAGNOSIS — J449 Chronic obstructive pulmonary disease, unspecified: Secondary | ICD-10-CM | POA: Diagnosis not present

## 2022-01-07 DIAGNOSIS — F32A Depression, unspecified: Secondary | ICD-10-CM | POA: Diagnosis not present

## 2022-01-07 DIAGNOSIS — F17210 Nicotine dependence, cigarettes, uncomplicated: Secondary | ICD-10-CM | POA: Diagnosis not present

## 2022-01-07 DIAGNOSIS — J9621 Acute and chronic respiratory failure with hypoxia: Secondary | ICD-10-CM | POA: Diagnosis not present

## 2022-01-07 DIAGNOSIS — E1142 Type 2 diabetes mellitus with diabetic polyneuropathy: Secondary | ICD-10-CM | POA: Diagnosis not present

## 2022-01-08 DIAGNOSIS — J449 Chronic obstructive pulmonary disease, unspecified: Secondary | ICD-10-CM | POA: Diagnosis not present

## 2022-01-08 DIAGNOSIS — J9621 Acute and chronic respiratory failure with hypoxia: Secondary | ICD-10-CM | POA: Diagnosis not present

## 2022-01-08 DIAGNOSIS — F32A Depression, unspecified: Secondary | ICD-10-CM | POA: Diagnosis not present

## 2022-01-08 DIAGNOSIS — F17210 Nicotine dependence, cigarettes, uncomplicated: Secondary | ICD-10-CM | POA: Diagnosis not present

## 2022-01-08 DIAGNOSIS — E1142 Type 2 diabetes mellitus with diabetic polyneuropathy: Secondary | ICD-10-CM | POA: Diagnosis not present

## 2022-01-10 DIAGNOSIS — E1142 Type 2 diabetes mellitus with diabetic polyneuropathy: Secondary | ICD-10-CM | POA: Diagnosis not present

## 2022-01-10 DIAGNOSIS — J9621 Acute and chronic respiratory failure with hypoxia: Secondary | ICD-10-CM | POA: Diagnosis not present

## 2022-01-10 DIAGNOSIS — J449 Chronic obstructive pulmonary disease, unspecified: Secondary | ICD-10-CM | POA: Diagnosis not present

## 2022-01-10 DIAGNOSIS — F17210 Nicotine dependence, cigarettes, uncomplicated: Secondary | ICD-10-CM | POA: Diagnosis not present

## 2022-01-10 DIAGNOSIS — F32A Depression, unspecified: Secondary | ICD-10-CM | POA: Diagnosis not present

## 2022-01-11 DIAGNOSIS — E1142 Type 2 diabetes mellitus with diabetic polyneuropathy: Secondary | ICD-10-CM | POA: Diagnosis not present

## 2022-01-11 DIAGNOSIS — J9621 Acute and chronic respiratory failure with hypoxia: Secondary | ICD-10-CM | POA: Diagnosis not present

## 2022-01-11 DIAGNOSIS — J449 Chronic obstructive pulmonary disease, unspecified: Secondary | ICD-10-CM | POA: Diagnosis not present

## 2022-01-11 DIAGNOSIS — F17210 Nicotine dependence, cigarettes, uncomplicated: Secondary | ICD-10-CM | POA: Diagnosis not present

## 2022-01-11 DIAGNOSIS — F32A Depression, unspecified: Secondary | ICD-10-CM | POA: Diagnosis not present

## 2022-01-13 DIAGNOSIS — F32A Depression, unspecified: Secondary | ICD-10-CM | POA: Diagnosis not present

## 2022-01-13 DIAGNOSIS — E1142 Type 2 diabetes mellitus with diabetic polyneuropathy: Secondary | ICD-10-CM | POA: Diagnosis not present

## 2022-01-13 DIAGNOSIS — F17210 Nicotine dependence, cigarettes, uncomplicated: Secondary | ICD-10-CM | POA: Diagnosis not present

## 2022-01-13 DIAGNOSIS — J449 Chronic obstructive pulmonary disease, unspecified: Secondary | ICD-10-CM | POA: Diagnosis not present

## 2022-01-13 DIAGNOSIS — J9621 Acute and chronic respiratory failure with hypoxia: Secondary | ICD-10-CM | POA: Diagnosis not present

## 2022-01-14 DIAGNOSIS — J449 Chronic obstructive pulmonary disease, unspecified: Secondary | ICD-10-CM | POA: Diagnosis not present

## 2022-01-14 DIAGNOSIS — E1142 Type 2 diabetes mellitus with diabetic polyneuropathy: Secondary | ICD-10-CM | POA: Diagnosis not present

## 2022-01-14 DIAGNOSIS — F32A Depression, unspecified: Secondary | ICD-10-CM | POA: Diagnosis not present

## 2022-01-14 DIAGNOSIS — J9621 Acute and chronic respiratory failure with hypoxia: Secondary | ICD-10-CM | POA: Diagnosis not present

## 2022-01-14 DIAGNOSIS — F17210 Nicotine dependence, cigarettes, uncomplicated: Secondary | ICD-10-CM | POA: Diagnosis not present

## 2022-01-15 DIAGNOSIS — F17210 Nicotine dependence, cigarettes, uncomplicated: Secondary | ICD-10-CM | POA: Diagnosis not present

## 2022-01-15 DIAGNOSIS — F259 Schizoaffective disorder, unspecified: Secondary | ICD-10-CM | POA: Diagnosis not present

## 2022-01-15 DIAGNOSIS — E1142 Type 2 diabetes mellitus with diabetic polyneuropathy: Secondary | ICD-10-CM | POA: Diagnosis not present

## 2022-01-15 DIAGNOSIS — E1169 Type 2 diabetes mellitus with other specified complication: Secondary | ICD-10-CM | POA: Diagnosis not present

## 2022-01-15 DIAGNOSIS — J449 Chronic obstructive pulmonary disease, unspecified: Secondary | ICD-10-CM | POA: Diagnosis not present

## 2022-01-15 DIAGNOSIS — J9621 Acute and chronic respiratory failure with hypoxia: Secondary | ICD-10-CM | POA: Diagnosis not present

## 2022-01-15 DIAGNOSIS — E782 Mixed hyperlipidemia: Secondary | ICD-10-CM | POA: Diagnosis not present

## 2022-01-15 DIAGNOSIS — F32A Depression, unspecified: Secondary | ICD-10-CM | POA: Diagnosis not present

## 2022-01-15 DIAGNOSIS — I10 Essential (primary) hypertension: Secondary | ICD-10-CM | POA: Diagnosis not present

## 2022-01-15 DIAGNOSIS — Z794 Long term (current) use of insulin: Secondary | ICD-10-CM | POA: Diagnosis not present

## 2022-01-15 DIAGNOSIS — J8410 Pulmonary fibrosis, unspecified: Secondary | ICD-10-CM | POA: Diagnosis not present

## 2022-01-18 DIAGNOSIS — J9621 Acute and chronic respiratory failure with hypoxia: Secondary | ICD-10-CM | POA: Diagnosis not present

## 2022-01-18 DIAGNOSIS — F17210 Nicotine dependence, cigarettes, uncomplicated: Secondary | ICD-10-CM | POA: Diagnosis not present

## 2022-01-18 DIAGNOSIS — J449 Chronic obstructive pulmonary disease, unspecified: Secondary | ICD-10-CM | POA: Diagnosis not present

## 2022-01-18 DIAGNOSIS — F32A Depression, unspecified: Secondary | ICD-10-CM | POA: Diagnosis not present

## 2022-01-18 DIAGNOSIS — E1142 Type 2 diabetes mellitus with diabetic polyneuropathy: Secondary | ICD-10-CM | POA: Diagnosis not present

## 2022-01-19 DIAGNOSIS — E1142 Type 2 diabetes mellitus with diabetic polyneuropathy: Secondary | ICD-10-CM | POA: Diagnosis not present

## 2022-01-19 DIAGNOSIS — J449 Chronic obstructive pulmonary disease, unspecified: Secondary | ICD-10-CM | POA: Diagnosis not present

## 2022-01-19 DIAGNOSIS — F32A Depression, unspecified: Secondary | ICD-10-CM | POA: Diagnosis not present

## 2022-01-19 DIAGNOSIS — J9621 Acute and chronic respiratory failure with hypoxia: Secondary | ICD-10-CM | POA: Diagnosis not present

## 2022-01-19 DIAGNOSIS — F17210 Nicotine dependence, cigarettes, uncomplicated: Secondary | ICD-10-CM | POA: Diagnosis not present

## 2022-01-20 DIAGNOSIS — F32A Depression, unspecified: Secondary | ICD-10-CM | POA: Diagnosis not present

## 2022-01-20 DIAGNOSIS — E1142 Type 2 diabetes mellitus with diabetic polyneuropathy: Secondary | ICD-10-CM | POA: Diagnosis not present

## 2022-01-20 DIAGNOSIS — F17210 Nicotine dependence, cigarettes, uncomplicated: Secondary | ICD-10-CM | POA: Diagnosis not present

## 2022-01-20 DIAGNOSIS — J449 Chronic obstructive pulmonary disease, unspecified: Secondary | ICD-10-CM | POA: Diagnosis not present

## 2022-01-20 DIAGNOSIS — J9621 Acute and chronic respiratory failure with hypoxia: Secondary | ICD-10-CM | POA: Diagnosis not present

## 2022-01-21 DIAGNOSIS — J449 Chronic obstructive pulmonary disease, unspecified: Secondary | ICD-10-CM | POA: Diagnosis not present

## 2022-01-21 DIAGNOSIS — F32A Depression, unspecified: Secondary | ICD-10-CM | POA: Diagnosis not present

## 2022-01-21 DIAGNOSIS — E1142 Type 2 diabetes mellitus with diabetic polyneuropathy: Secondary | ICD-10-CM | POA: Diagnosis not present

## 2022-01-21 DIAGNOSIS — F17210 Nicotine dependence, cigarettes, uncomplicated: Secondary | ICD-10-CM | POA: Diagnosis not present

## 2022-01-21 DIAGNOSIS — J9621 Acute and chronic respiratory failure with hypoxia: Secondary | ICD-10-CM | POA: Diagnosis not present

## 2022-01-22 DIAGNOSIS — F32A Depression, unspecified: Secondary | ICD-10-CM | POA: Diagnosis not present

## 2022-01-22 DIAGNOSIS — J9621 Acute and chronic respiratory failure with hypoxia: Secondary | ICD-10-CM | POA: Diagnosis not present

## 2022-01-22 DIAGNOSIS — F17210 Nicotine dependence, cigarettes, uncomplicated: Secondary | ICD-10-CM | POA: Diagnosis not present

## 2022-01-22 DIAGNOSIS — E1142 Type 2 diabetes mellitus with diabetic polyneuropathy: Secondary | ICD-10-CM | POA: Diagnosis not present

## 2022-01-22 DIAGNOSIS — J449 Chronic obstructive pulmonary disease, unspecified: Secondary | ICD-10-CM | POA: Diagnosis not present

## 2022-01-23 DIAGNOSIS — E1142 Type 2 diabetes mellitus with diabetic polyneuropathy: Secondary | ICD-10-CM | POA: Diagnosis not present

## 2022-01-23 DIAGNOSIS — J449 Chronic obstructive pulmonary disease, unspecified: Secondary | ICD-10-CM | POA: Diagnosis not present

## 2022-01-23 DIAGNOSIS — J9621 Acute and chronic respiratory failure with hypoxia: Secondary | ICD-10-CM | POA: Diagnosis not present

## 2022-01-23 DIAGNOSIS — F32A Depression, unspecified: Secondary | ICD-10-CM | POA: Diagnosis not present

## 2022-01-23 DIAGNOSIS — F17210 Nicotine dependence, cigarettes, uncomplicated: Secondary | ICD-10-CM | POA: Diagnosis not present

## 2022-01-24 DIAGNOSIS — F17210 Nicotine dependence, cigarettes, uncomplicated: Secondary | ICD-10-CM | POA: Diagnosis not present

## 2022-01-24 DIAGNOSIS — J449 Chronic obstructive pulmonary disease, unspecified: Secondary | ICD-10-CM | POA: Diagnosis not present

## 2022-01-24 DIAGNOSIS — J9621 Acute and chronic respiratory failure with hypoxia: Secondary | ICD-10-CM | POA: Diagnosis not present

## 2022-01-24 DIAGNOSIS — E1142 Type 2 diabetes mellitus with diabetic polyneuropathy: Secondary | ICD-10-CM | POA: Diagnosis not present

## 2022-01-24 DIAGNOSIS — F32A Depression, unspecified: Secondary | ICD-10-CM | POA: Diagnosis not present
